# Patient Record
Sex: Female | Born: 1990
Health system: Southern US, Community
[De-identification: ages and names within clinical notes are randomized; demographics above are authoritative.]

## PROBLEM LIST (undated history)

## (undated) ENCOUNTER — Ambulatory Visit (HOSPITAL_COMMUNITY): Payer: Medicaid Other

## (undated) ENCOUNTER — Inpatient Hospital Stay (HOSPITAL_COMMUNITY): Payer: Self-pay

## (undated) ENCOUNTER — Emergency Department (HOSPITAL_BASED_OUTPATIENT_CLINIC_OR_DEPARTMENT_OTHER): Admission: EM | Source: Home / Self Care

## (undated) DIAGNOSIS — R768 Other specified abnormal immunological findings in serum: Secondary | ICD-10-CM

## (undated) DIAGNOSIS — O24419 Gestational diabetes mellitus in pregnancy, unspecified control: Secondary | ICD-10-CM

## (undated) DIAGNOSIS — K219 Gastro-esophageal reflux disease without esophagitis: Secondary | ICD-10-CM

## (undated) DIAGNOSIS — F419 Anxiety disorder, unspecified: Secondary | ICD-10-CM

## (undated) DIAGNOSIS — E119 Type 2 diabetes mellitus without complications: Secondary | ICD-10-CM

## (undated) DIAGNOSIS — M329 Systemic lupus erythematosus, unspecified: Secondary | ICD-10-CM

## (undated) DIAGNOSIS — D649 Anemia, unspecified: Secondary | ICD-10-CM

## (undated) DIAGNOSIS — T7840XA Allergy, unspecified, initial encounter: Secondary | ICD-10-CM

## (undated) DIAGNOSIS — F32A Depression, unspecified: Secondary | ICD-10-CM

## (undated) DIAGNOSIS — I10 Essential (primary) hypertension: Secondary | ICD-10-CM

## (undated) DIAGNOSIS — E282 Polycystic ovarian syndrome: Secondary | ICD-10-CM

## (undated) DIAGNOSIS — E669 Obesity, unspecified: Secondary | ICD-10-CM

## (undated) HISTORY — DX: Systemic lupus erythematosus, unspecified: M32.9

## (undated) HISTORY — DX: Anxiety disorder, unspecified: F41.9

## (undated) HISTORY — DX: Depression, unspecified: F32.A

## (undated) HISTORY — PX: TONSILLECTOMY: SUR1361

## (undated) HISTORY — DX: Gastro-esophageal reflux disease without esophagitis: K21.9

## (undated) HISTORY — DX: Anemia, unspecified: D64.9

## (undated) HISTORY — DX: Other specified abnormal immunological findings in serum: R76.8

## (undated) HISTORY — DX: Allergy, unspecified, initial encounter: T78.40XA

## (undated) HISTORY — DX: Obesity, unspecified: E66.9

---
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
## 2012-01-06 ENCOUNTER — Telehealth: Payer: Self-pay | Admitting: Family Medicine

## 2012-01-06 DIAGNOSIS — E282 Polycystic ovarian syndrome: Secondary | ICD-10-CM

## 2012-01-06 MED ORDER — METFORMIN HCL 500 MG PO TABS: 500.0000 mg | ORAL_TABLET | Freq: Two times a day (BID) | ORAL | Status: DC

## 2012-01-06 NOTE — Telephone Encounter (Signed)
Will forward message to Dr.  Konrad Dolores, message left on voicemail for patient  .

## 2012-01-06 NOTE — Telephone Encounter (Signed)
Rx for Metformin sent in to pharmacy for treatment of PCOS w/ dose escelation in 2 weeks.   Shelly Flatten, MD Family Medicine PGY-2 01/06/2012, 1:52 PM

## 2012-01-06 NOTE — Telephone Encounter (Signed)
Called to patient's  phone number  And message left that RX was sent in and explained directions on voicemail. Also advised her to go directly by  directions on bottle.

## 2012-01-06 NOTE — Telephone Encounter (Signed)
Metformin has been sent in. Please let pt know it is there.  GI upset is main side effect 500 BID WC x2wks then increase to 1000mg  BID. If unable to tolerate higher dose then go back to decreased dose

## 2012-01-06 NOTE — Telephone Encounter (Signed)
Pt is supposed to start taking Metformin but Losq wanted to wait until her stomach prob resolved.  She is now ready for the script and needs it called into [facility] [address]

## 2012-01-06 NOTE — Assessment & Plan Note (Signed)
Starting Metformin 500mg  BID w/ planned dose increase in 2 weeks

## 2012-01-26 ENCOUNTER — Telehealth: Payer: Self-pay | Admitting: Family Medicine

## 2012-01-26 NOTE — Telephone Encounter (Signed)
Pt calls back and states the cramping has subsided but continues w/diarrhea.

## 2012-01-26 NOTE — Telephone Encounter (Signed)
Pt stopped taking her Metformin 2 days ago because it made her feel bad - wants to know if there is something else she can take.

## 2012-01-26 NOTE — Telephone Encounter (Signed)
Pt states she is on BID and still w/nausea and cramping.Will forward to Dr Gwenlyn Saran for review.

## 2012-01-28 NOTE — Telephone Encounter (Signed)
Called patient and left message stating that of metformin was causing diarrhea and abdominal discomfort, she could stop taking it for now. Advised to continue taking OCP's. Will discuss further at next appointment.

## 2012-01-28 NOTE — Telephone Encounter (Signed)
Patient is calling back because she hasn't heard back from anyone on her previous message.

## 2012-01-28 NOTE — Telephone Encounter (Signed)
Left message for pt to call and also routed message to Dr.Losq for review. Lorenda Hatchet, Renato Battles

## 2012-02-07 ENCOUNTER — Ambulatory Visit (INDEPENDENT_AMBULATORY_CARE_PROVIDER_SITE_OTHER): Payer: BC Managed Care – PPO | Admitting: Family Medicine

## 2012-02-07 ENCOUNTER — Encounter: Payer: Self-pay | Admitting: Family Medicine

## 2012-02-07 VITALS — BP 127/80 | HR 92 | Temp 98.8°F | Ht 68.0 in | Wt 235.0 lb

## 2012-02-07 DIAGNOSIS — M549 Dorsalgia, unspecified: Secondary | ICD-10-CM

## 2012-02-07 DIAGNOSIS — R350 Frequency of micturition: Secondary | ICD-10-CM

## 2012-02-07 LAB — POCT UA - MICROSCOPIC ONLY: Epithelial cells, urine per micros: >20

## 2012-02-07 LAB — POCT URINALYSIS DIPSTICK
Bilirubin, UA: NEGATIVE
Glucose, UA: NEGATIVE
Ketones, UA: NEGATIVE
Nitrite, UA: NEGATIVE
Spec Grav, UA: 1.025
Urobilinogen, UA: 1
pH, UA: 6.5

## 2012-02-07 NOTE — Patient Instructions (Signed)
It was nice to meet you.  Your urine does not show a UTI.  I think you strained your back. Please see the attached hand out with back stretches and exercises, and you can take ibuprofen or tylenol as needed for pain.

## 2012-02-08 DIAGNOSIS — M549 Dorsalgia, unspecified: Secondary | ICD-10-CM | POA: Insufficient documentation

## 2012-02-08 NOTE — Progress Notes (Signed)
  Subjective:    Patient ID: Diane Lloyd, female    DOB: 1990-03-28, 22 y.o.   MRN: [id number]  HPI  Lynett Fish comes in with back pain that started last week after a shift at work (works in [employer]).  She says it hurts in her low back, does not radiate anywhere.  She says she also had some urinary frequency with the back pain, so she was concerned about a UTI.  She denies fevers, chills, nausea, vomiting, pain or burning with urniation.   Review of Systems     Objective:   Physical Exam BP 127/80  Pulse 92  Temp 98.8 F (37.1 C) (Oral)  Ht 5\' 8"  (1.727 m)  Wt 235 lb (106.595 kg)  BMI 35.73 kg/m2  LMP 02/02/2012 General appearance: alert, cooperative and no distress Back: symmetric, no curvature. ROM normal. No CVA tenderness. Mild paraspinal muscle tenderness to palpation, normal strength, sensation, reflexes of LE.  Lungs: clear to auscultation bilaterally Heart: regular rate and rhythm, S1, S2 normal, no murmur, click, rub or gallop Abdomen: soft, non-tender; bowel sounds normal; no masses,  no organomegaly Extremities: extremities normal, atraumatic, no cyanosis or edema      Assessment & Plan:

## 2012-02-08 NOTE — Assessment & Plan Note (Signed)
UA not a clean catch, but no fevers or cva tenderness, feel this is a muscle strain, and frequency (that has now resolved) was coincidental.  Discussed back pain and gave hand out with stretches/exercises, advised tylenol or ibuprofen OTC.

## 2012-03-10 ENCOUNTER — Encounter: Payer: Self-pay | Admitting: Family Medicine

## 2012-03-10 ENCOUNTER — Ambulatory Visit (INDEPENDENT_AMBULATORY_CARE_PROVIDER_SITE_OTHER): Payer: BC Managed Care – PPO | Admitting: Family Medicine

## 2012-03-10 VITALS — BP 142/85 | HR 86 | Temp 98.3°F | Ht 68.5 in | Wt 242.0 lb

## 2012-03-10 DIAGNOSIS — T148XXA Other injury of unspecified body region, initial encounter: Secondary | ICD-10-CM | POA: Insufficient documentation

## 2012-03-10 NOTE — Patient Instructions (Addendum)
Diane Lloyd,  Thank you for coming in today.  Your puncture site looks healthy. Please wash the skin like normal. Your tetanus series is complete and you had a booster 2 years ago so you are covered.   Take tylenol [phone] mg every 8 hrs for pain. Try motrin or naproxen if tylenol does not help. May also ice the area.  F/u for worsening pain, new redness/swelling or fever.   Dr. Armen Pickup

## 2012-03-10 NOTE — Assessment & Plan Note (Signed)
A: clean puncture wound L thigh. No evidence of cellulitis. Tetanus up to date. P: Normal care Ice, tylenol, NSAID prn pain.

## 2012-03-10 NOTE — Progress Notes (Signed)
Subjective:     Patient ID: Diane Lloyd, female   DOB: 1990-12-11, 22 y.o.   MRN: [id number]  HPI 22 yo F presents for SD visit for puncture wound to L lateral thigh. Occurred two days ago. Punctured with an ink pin. She admits to pain, no treatment, denies redness, swelling and fever.   Review of Systems As per HPI    Objective:   Physical Exam BP 142/85  Pulse 86  Temp(Src) 98.3 F (36.8 C) (Oral)  Ht 5' 8.5" (1.74 m)  Wt 242 lb (109.77 kg)  BMI 36.26 kg/m2 General appearance: alert, cooperative and no distress Skin: pen sized puncture wound L lateral thigh, indurated, no fluctuance, erythema or edema.   Last tetanus booster in 2012.     Assessment and Plan:

## 2012-03-17 ENCOUNTER — Ambulatory Visit: Payer: BC Managed Care – PPO | Admitting: Family Medicine

## 2012-03-20 ENCOUNTER — Encounter: Payer: Self-pay | Admitting: Family Medicine

## 2012-03-20 ENCOUNTER — Ambulatory Visit (INDEPENDENT_AMBULATORY_CARE_PROVIDER_SITE_OTHER): Payer: BC Managed Care – PPO | Admitting: Family Medicine

## 2012-03-20 VITALS — BP 127/79 | HR 80 | Temp 98.4°F | Ht 68.5 in | Wt 239.0 lb

## 2012-03-20 DIAGNOSIS — K219 Gastro-esophageal reflux disease without esophagitis: Secondary | ICD-10-CM

## 2012-03-20 MED ORDER — OMEPRAZOLE 20 MG PO TBEC: 20.0000 mg | DELAYED_RELEASE_TABLET | Freq: Every day | ORAL | Status: DC

## 2012-03-20 NOTE — Assessment & Plan Note (Signed)
Suspect GERD, will start omeprazole.  Also discussed dietary and lifestyle modification and gave hand out.

## 2012-03-20 NOTE — Patient Instructions (Signed)
Diet for Gastroesophageal Reflux Disease, Adult  Reflux (acid reflux) is when acid from your stomach flows up into the esophagus. When acid comes in contact with the esophagus, the acid causes irritation and soreness (inflammation) in the esophagus. When reflux happens often or so severely that it causes damage to the esophagus, it is called gastroesophageal reflux disease (GERD). Nutrition therapy can help ease the discomfort of GERD.  FOODS OR DRINKS TO AVOID OR LIMIT   Smoking or chewing tobacco. Nicotine is one of the most potent stimulants to acid production in the gastrointestinal tract.   Caffeinated and decaffeinated coffee and black tea.   Regular or low-calorie carbonated beverages or energy drinks (caffeine-free carbonated beverages are allowed).    Strong spices, such as black pepper, white pepper, red pepper, cayenne, curry powder, and chili powder.   Peppermint or spearmint.   Chocolate.   High-fat foods, including meats and fried foods. Extra added fats including oils, butter, salad dressings, and nuts. Limit these to less than 8 tsp per day.   Fruits and vegetables if they are not tolerated, such as citrus fruits or tomatoes.   Alcohol.   Any food that seems to aggravate your condition.  If you have questions regarding your diet, call your caregiver or a registered dietitian.  OTHER THINGS THAT MAY HELP GERD INCLUDE:    Eating your meals slowly, in a relaxed setting.   Eating 5 to 6 small meals per day instead of 3 large meals.   Eliminating food for a period of time if it causes distress.   Not lying down until 3 hours after eating a meal.   Keeping the head of your bed raised 6 to 9 inches (15 to 23 cm) by using a foam wedge or blocks under the legs of the bed. Lying flat may make symptoms worse.   Being physically active. Weight loss may be helpful in reducing reflux in overweight or obese adults.   Wear loose fitting clothing  EXAMPLE MEAL PLAN  This meal plan is approximately  2,000 calories based on ChooseMyPlate.gov meal planning guidelines.  Breakfast    cup cooked oatmeal.   1 cup strawberries.   1 cup low-fat milk.   1 oz almonds.  Snack   1 cup cucumber slices.   6 oz yogurt (made from low-fat or fat-free milk).  Lunch   2 slice whole-wheat bread.   2 oz sliced turkey.   2 tsp mayonnaise.   1 cup blueberries.   1 cup snap peas.  Snack   6 whole-wheat crackers.   1 oz string cheese.  Dinner    cup brown rice.   1 cup mixed veggies.   1 tsp olive oil.   3 oz grilled fish.  Document Released: 12/28/2004 Document Revised: 03/22/2011 Document Reviewed: 11/13/2010  [employer] Patient Information 2013 [employer].

## 2012-03-20 NOTE — Progress Notes (Signed)
  Subjective:    Patient ID: Diane Lloyd, female    DOB: January 14, 1990, 22 y.o.   MRN: [id number]  HPI  Diane Lloyd comes in for abdominal pain that has been going on for 2-3 weeks.  She says it is an epigastric pain and describes it as an ache.  It is worst at night when she lays down.  She endorses a metallic taste in her mouth and some nausea.  She has not had constipation or diarrhea, no fevers, chills, or vomiting.   Review of Systems See HPI    Objective:   Physical Exam BP 127/79  Pulse 80  Temp(Src) 98.4 F (36.9 C) (Oral)  Ht 5' 8.5" (1.74 m)  Wt 239 lb (108.41 kg)  BMI 35.81 kg/m2  LMP 03/09/2012 General appearance: alert, cooperative and no distress Lungs: clear to auscultation bilaterally Heart: regular rate and rhythm, S1, S2 normal, no murmur, click, rub or gallop Abdomen: soft, mild epigastric tenderness, no organomegaly, rebound or guarding.  Pulses: 2+ and symmetric       Assessment & Plan:

## 2012-05-05 ENCOUNTER — Ambulatory Visit: Payer: BC Managed Care – PPO | Admitting: Family Medicine

## 2012-05-09 ENCOUNTER — Telehealth: Payer: Self-pay | Admitting: Family Medicine

## 2012-05-09 NOTE — Telephone Encounter (Signed)
Mom informed copy of shot record ready to be picked up

## 2012-05-09 NOTE — Telephone Encounter (Signed)
Mom requests Imm record faxed to [phone]. Faxed.

## 2012-05-09 NOTE — Telephone Encounter (Signed)
Mom need to pick up copy of shot record for school.  Please call when ready

## 2012-05-10 ENCOUNTER — Ambulatory Visit (INDEPENDENT_AMBULATORY_CARE_PROVIDER_SITE_OTHER): Payer: BC Managed Care – PPO | Admitting: *Deleted

## 2012-05-10 DIAGNOSIS — Z111 Encounter for screening for respiratory tuberculosis: Secondary | ICD-10-CM

## 2012-05-10 NOTE — Progress Notes (Signed)
Tuberculin skin test applied to left ventral forearm.  she will come into office on Friday for reading of skin test Cailyn Houdek, Harold Hedge, RN

## 2012-05-17 ENCOUNTER — Encounter: Payer: Self-pay | Admitting: Family Medicine

## 2012-05-17 ENCOUNTER — Ambulatory Visit (INDEPENDENT_AMBULATORY_CARE_PROVIDER_SITE_OTHER): Payer: BC Managed Care – PPO | Admitting: Family Medicine

## 2012-05-17 VITALS — BP 132/78 | HR 78 | Temp 98.6°F | Ht 68.5 in | Wt 240.0 lb

## 2012-05-17 DIAGNOSIS — E282 Polycystic ovarian syndrome: Secondary | ICD-10-CM

## 2012-05-17 DIAGNOSIS — Z111 Encounter for screening for respiratory tuberculosis: Secondary | ICD-10-CM

## 2012-05-17 NOTE — Assessment & Plan Note (Signed)
On OCPs, no pain or issues. Counseling regarding PCOS done. Pt in agreement with not getting repeat U/S.

## 2012-05-17 NOTE — Progress Notes (Signed)
S: Pt comes in today for PCOS.  Has been diagnosed with PCOS and is currently on Sprintec.  She would like a referral to women's because she would like another ultrasound.  She is not having any pain or issues, just wasn't sure when she needed another one.  Her pain is much bette since starting the OCP.  She has previously tried metformin but it caused significant nausea so she stopped it.  She is having regular periods on the OCP, usually lasting 5-6 days with ~3 heavy days where she used 5-6 pads per day.  She reports this is an improvement.  She also started having diarrhea yesterday. No nausea or vomiting. She doesn't think she ate anything different yesterday.  Stools are watery, without blood.  No recent travel.  No sick contacts. No fevers/chills. Able to eat and drink.    ROS: Per HPI  History  Smoking status  . Never Smoker   Smokeless tobacco  . Not on file    O:  Filed Vitals:   05/17/12 1524  BP: 132/78  Pulse: 78  Temp: 98.6 F (37 C)    Gen: NAD   A/P: 22 y.o. female p/w PCOS -See problem list -f/u in PRN

## 2012-05-17 NOTE — Patient Instructions (Addendum)
It was nice to meet you today.  For the diarrhea, you can use immodium for the next few days.  For your PCOS, continue your birth control pill.  We do not need to get another ultrasound unless you start having pain or problems again.  Weight loss will also help!  Come back to see Dr. Gwenlyn Saran as needed.

## 2012-05-19 ENCOUNTER — Encounter: Payer: Self-pay | Admitting: *Deleted

## 2012-05-19 ENCOUNTER — Ambulatory Visit (INDEPENDENT_AMBULATORY_CARE_PROVIDER_SITE_OTHER): Payer: BC Managed Care – PPO | Admitting: *Deleted

## 2012-05-19 DIAGNOSIS — Z111 Encounter for screening for respiratory tuberculosis: Secondary | ICD-10-CM

## 2012-05-19 LAB — TB SKIN TEST
Induration: 0 mm
TB Skin Test: NEGATIVE

## 2012-05-19 NOTE — Progress Notes (Signed)
PPD Reading Note PPD read and results entered in EpicCare. Result: 0 mm induration. Interpretation: Negative Richardson, Jeannette Ann, RN  

## 2012-06-06 ENCOUNTER — Telehealth: Payer: Self-pay | Admitting: Family Medicine

## 2012-06-06 NOTE — Telephone Encounter (Signed)
Pt has questions about conceiving. Wants to know when she can start taking the clomide (?) Please advise

## 2012-06-06 NOTE — Telephone Encounter (Signed)
LMOVM for patient instructing her that she will need an office visit with her PCP to discuss her questions.  Kadeisha Betsch, Darlyne Russian, CMA

## 2012-06-23 ENCOUNTER — Encounter: Payer: Self-pay | Admitting: Family Medicine

## 2012-06-23 ENCOUNTER — Ambulatory Visit (INDEPENDENT_AMBULATORY_CARE_PROVIDER_SITE_OTHER): Payer: BC Managed Care – PPO | Admitting: Family Medicine

## 2012-06-23 VITALS — BP 145/85 | HR 91 | Ht 68.0 in | Wt 236.4 lb

## 2012-06-23 DIAGNOSIS — E282 Polycystic ovarian syndrome: Secondary | ICD-10-CM

## 2012-06-23 NOTE — Patient Instructions (Addendum)
You can stop the birth control after the pack is done.  Log any irregular bleeding.   Cut out sodas and increase the amount of vegetables. Increase your activity level.   Follow up in 6 weeks.

## 2012-06-25 NOTE — Progress Notes (Signed)
Patient ID: [name]    DOB: 1990/01/14, 22 y.o.   MRN: [id number] --- Subjective:  Cedra is a 22 y.o.female with PCOS who presents for follow up.  - patient expresses wanting to become pregnant and would like to know how to proceed given that she has PCOS.  She has been on OCP's since October which was started for amnorrhea and PCOS. She has a history of irregular periods since adolescence and had a period of no period for 3 months prior to her starting OCP.  Since she has been on sprintec, she has had regular periods. She takes pill regularly with only occasional missed pills.  She was on metformin for a couple of months but could not tolerate the GI side effects and this was stopped.  She has gained 19lbs since May 2013. She attributes this to eating at fast food restaurants and not watching what she eats. She states that she drinks non diet sodas and doesn't exercise.  She reports that there was a time where she lost 50lbs by watching her diet and exercising.   She is in a relationship with her boyfriend of 5 years and they are both wanting to get pregnant. He is 20 years old. 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 She lives with her parents currently but will be moving in her own apartment soon.   ROS: see HPI Past Medical History: reviewed and updated medications and allergies. Social History: Tobacco: none   Objective: Filed Vitals:   06/23/12 0835  BP: 145/85  Pulse: 91    Physical Examination:   General appearance - alert, well appearing, and in no distress Skin - acanthosis nigricans on neck Chest - clear to auscultation, no wheezes, rales or rhonchi, symmetric air entry Heart - normal rate, regular rhythm, normal S1, S2, no murmurs, rubs, clicks or gallops Abdomen - soft, nontender, nondistended Extremities - no pedal edema

## 2012-06-25 NOTE — Assessment & Plan Note (Signed)
Patient with PCOS wanting to become pregnant. Patient is young and I do not see huge rush in pregnancy. She does appear to have a stable relationship and a stable life plan (at school and working part time).  Will stop OCP's after last pill in packet. Recommended weight loss as initial step in more regular periods. Reviewed diet tips with patient. She has picked to quit sodas and eat more vegetables. She will also start walking more for exercise.  Patient to return in 6 weeks. At that time, will check A1C for evaluation of insulin resistance. She was on metformin which she did not tolerate. She may benefit from getting on brand metformin which may have fewer GI side effects. If cycles do not regulate with weight loss, will refer her to gyn for further eval and treatment.

## 2012-08-04 ENCOUNTER — Ambulatory Visit: Payer: BC Managed Care – PPO | Admitting: Family Medicine

## 2012-08-16 ENCOUNTER — Ambulatory Visit (INDEPENDENT_AMBULATORY_CARE_PROVIDER_SITE_OTHER): Payer: BC Managed Care – PPO | Admitting: Family Medicine

## 2012-08-16 ENCOUNTER — Encounter: Payer: Self-pay | Admitting: Family Medicine

## 2012-08-16 VITALS — BP 146/90 | HR 91 | Temp 98.3°F | Wt 235.0 lb

## 2012-08-16 DIAGNOSIS — R0789 Other chest pain: Secondary | ICD-10-CM | POA: Insufficient documentation

## 2012-08-16 DIAGNOSIS — R1013 Epigastric pain: Secondary | ICD-10-CM

## 2012-08-16 DIAGNOSIS — R109 Unspecified abdominal pain: Secondary | ICD-10-CM

## 2012-08-16 LAB — POCT URINALYSIS DIPSTICK
Bilirubin, UA: NEGATIVE
Glucose, UA: NEGATIVE
Ketones, UA: NEGATIVE
Leukocytes, UA: NEGATIVE
Nitrite, UA: NEGATIVE
Protein, UA: NEGATIVE
Spec Grav, UA: 1.015
Urobilinogen, UA: 0.2
pH, UA: 7

## 2012-08-16 LAB — POCT UA - MICROSCOPIC ONLY

## 2012-08-16 MED ORDER — OMEPRAZOLE 20 MG PO TBEC: 20.0000 mg | DELAYED_RELEASE_TABLET | Freq: Every day | ORAL | Status: DC

## 2012-08-16 NOTE — Addendum Note (Signed)
Addended by: [place], Vale Peraza on: 08/16/2012 03:30 PM   Modules accepted: Orders

## 2012-08-16 NOTE — Progress Notes (Signed)
  Subjective:    Patient ID: Diane Lloyd, female    DOB: 19-Mar-1990, 22 y.o.   MRN: [id number]  Abdominal Pain   Patient is a 22 yo female who presents with stomach pains.  Located ion epigastric region mainly. Comes and goes. Not like menstrual or hunger pains. Dull ache. Has history of GERD and was previously on nexium. Has reflux burning pain in chest. Will wake up with this sensation following eating spicy foods. Notes some nausea. Denies vomiting and diarrhea. Denies radiation of this chest ache and pain. States chest pain gets better with activity. Denies abnormal BMs. States has normal regular periods every 4 weeks.  Patient notes that her grandmother just died of some type of abdominal cancer and she is worried that this may be related to cancer.  Review of Systems  Gastrointestinal: Positive for abdominal pain.   see HPI     Objective:   Physical Exam  Constitutional: She appears well-developed and well-nourished.  HENT:  Head: Normocephalic and atraumatic.  Cardiovascular: Regular rhythm and normal heart sounds.   Pulmonary/Chest: Effort normal and breath sounds normal.  Abdominal: Soft. Bowel sounds are normal. She exhibits no distension. There is tenderness (epigastric region). There is no rebound and no guarding.  Musculoskeletal:  Chest non-tender to palpation  BP 146/90  Pulse 91  Temp(Src) 98.3 F (36.8 C) (Oral)  Wt 235 lb (106.595 kg)  BMI 35.74 kg/m2  LMP 08/01/2012    Assessment & Plan:

## 2012-08-16 NOTE — Assessment & Plan Note (Signed)
Presents with epigastric pain and symptoms consistent with reflux. Has not been on PPI in about a year per the patient. I believe her symptoms are most likely related to not having been on the PPI. Will restart nexium and advised to follow-up if not improving.

## 2012-08-16 NOTE — Patient Instructions (Addendum)
Nice to meet you. I think your abdominal pain is related to your reflux and not being on medication. I have prescribed your omeprazole for this.

## 2012-08-31 ENCOUNTER — Ambulatory Visit: Payer: BC Managed Care – PPO | Admitting: Family Medicine

## 2012-09-18 ENCOUNTER — Ambulatory Visit: Payer: Self-pay | Admitting: Family Medicine

## 2012-11-27 ENCOUNTER — Telehealth: Payer: Self-pay | Admitting: Family Medicine

## 2012-11-27 ENCOUNTER — Encounter (HOSPITAL_COMMUNITY): Payer: Self-pay | Admitting: Emergency Medicine

## 2012-11-27 ENCOUNTER — Emergency Department (HOSPITAL_COMMUNITY): Payer: BC Managed Care – PPO

## 2012-11-27 ENCOUNTER — Emergency Department (HOSPITAL_COMMUNITY)
Admission: EM | Admit: 2012-11-27 | Discharge: 2012-11-27 | Disposition: A | Discharge status: Home / Self Care | Payer: BC Managed Care – PPO | Attending: Emergency Medicine | Admitting: Emergency Medicine

## 2012-11-27 ENCOUNTER — Ambulatory Visit: Payer: Self-pay

## 2012-11-27 DIAGNOSIS — Z3202 Encounter for pregnancy test, result negative: Secondary | ICD-10-CM | POA: Insufficient documentation

## 2012-11-27 DIAGNOSIS — Z862 Personal history of diseases of the blood and blood-forming organs and certain disorders involving the immune mechanism: Secondary | ICD-10-CM | POA: Insufficient documentation

## 2012-11-27 DIAGNOSIS — R109 Unspecified abdominal pain: Secondary | ICD-10-CM

## 2012-11-27 DIAGNOSIS — Z8739 Personal history of other diseases of the musculoskeletal system and connective tissue: Secondary | ICD-10-CM | POA: Insufficient documentation

## 2012-11-27 DIAGNOSIS — E669 Obesity, unspecified: Secondary | ICD-10-CM | POA: Insufficient documentation

## 2012-11-27 DIAGNOSIS — R1013 Epigastric pain: Secondary | ICD-10-CM | POA: Insufficient documentation

## 2012-11-27 DIAGNOSIS — Z79899 Other long term (current) drug therapy: Secondary | ICD-10-CM | POA: Insufficient documentation

## 2012-11-27 DIAGNOSIS — K219 Gastro-esophageal reflux disease without esophagitis: Secondary | ICD-10-CM | POA: Insufficient documentation

## 2012-11-27 DIAGNOSIS — J45909 Unspecified asthma, uncomplicated: Secondary | ICD-10-CM | POA: Insufficient documentation

## 2012-11-27 DIAGNOSIS — R350 Frequency of micturition: Secondary | ICD-10-CM | POA: Insufficient documentation

## 2012-11-27 DIAGNOSIS — J069 Acute upper respiratory infection, unspecified: Secondary | ICD-10-CM

## 2012-11-27 LAB — POCT PREGNANCY, URINE: Preg Test, Ur: NEGATIVE

## 2012-11-27 LAB — URINALYSIS, ROUTINE W REFLEX MICROSCOPIC
Bilirubin Urine: NEGATIVE
Glucose, UA: NEGATIVE mg/dL
Hgb urine dipstick: NEGATIVE
Ketones, ur: NEGATIVE mg/dL
Leukocytes, UA: NEGATIVE
Nitrite: NEGATIVE
Protein, ur: NEGATIVE mg/dL
Specific Gravity, Urine: 1.014 (ref 1.005–1.030)
Urobilinogen, UA: 1 mg/dL (ref 0.0–1.0)
pH: 6.5 (ref 5.0–8.0)

## 2012-11-27 LAB — GLUCOSE, CAPILLARY: Glucose-Capillary: 121 mg/dL — ABNORMAL HIGH (ref 70–99)

## 2012-11-27 MED ORDER — GI COCKTAIL ~~LOC~~
30.0000 mL | Freq: Once | ORAL | Status: AC
  Administered 2012-11-27: 30 mL via ORAL
  Filled 2012-11-27: qty 30

## 2012-11-27 MED ORDER — PANTOPRAZOLE SODIUM 40 MG PO TBEC
40.0000 mg | DELAYED_RELEASE_TABLET | Freq: Once | ORAL | Status: AC
  Administered 2012-11-27: 40 mg via ORAL
  Filled 2012-11-27: qty 1

## 2012-11-27 MED ORDER — GUAIFENESIN 100 MG/5ML PO LIQD: 100.0000 mg | ORAL | Status: DC | PRN

## 2012-11-27 MED ORDER — FAMOTIDINE 20 MG PO TABS: 20.0000 mg | ORAL_TABLET | Freq: Two times a day (BID) | ORAL | Status: DC

## 2012-11-27 MED ORDER — FAMOTIDINE 20 MG PO TABS
20.0000 mg | ORAL_TABLET | Freq: Once | ORAL | Status: AC
  Administered 2012-11-27: 20 mg via ORAL
  Filled 2012-11-27: qty 1

## 2012-11-27 NOTE — Telephone Encounter (Signed)
Pt called and would like a copy of her ppd and flu shot for this year printed out for pick up today. jw

## 2012-11-27 NOTE — ED Notes (Signed)
PT is here with upper abdominal pain for months with intermittent chest pains.  Pt reports cough

## 2012-11-27 NOTE — ED Provider Notes (Signed)
CSN: [id number]     Arrival date & time 11/27/12  0413 History   First MD Initiated Contact with Patient 11/27/12 0414     Chief Complaint  Patient presents with  . Abdominal Pain  . Cough   (Consider location/radiation/quality/duration/timing/severity/associated sxs/prior Treatment) HPI  History provided by patient. Presents with multiple complaints tonight. Chief complaint is epigastric pain, history of GERD, previously prescribed omeprazole. For dinner she ate at [employer], had a bacon cheddar burger, Cajun fries and sweet tea. Symptoms developed after that. She denies any nausea vomiting. No back pain. No chest pain or shortness of breath.  Patient also has dry cough for the last few days. No difficulty breathing. Her mother has been sick at home with similar symptoms. No fevers or chills. She did get a flu shot about 2 months ago.  Patient also complaining of urinary frequency. No dysuria. No urgency. No history of diabetes. She is requesting urinalysis.  Past Medical History  Diagnosis Date  . Anemia     iron def  . Obesity, unspecified   . Lupus   . Asthma    Past Surgical History  Procedure Laterality Date  . Tonsillectomy     Family History  Problem Relation Age of Onset  . Hypertension Mother   . Lupus Mother   . Diabetes Mother   . Cancer Father 4    esophagus  . Asthma Father   . Cancer Maternal Uncle 40    pancreatic cancer  . Cancer Paternal Grandmother 17    esophageal   History  Substance Use Topics  . Smoking status: Never Smoker   . Smokeless tobacco: Not on file  . Alcohol Use: No   OB History   Grav Para Term Preterm Abortions TAB SAB Ect Mult Living   0              Review of Systems  Constitutional: Negative for fever and chills.  Respiratory: Positive for cough. Negative for shortness of breath.   Cardiovascular: Negative for chest pain.  Gastrointestinal: Positive for abdominal pain. Negative for vomiting.  Endocrine: Negative for  polyphagia.  Genitourinary: Negative for dysuria.  Musculoskeletal: Negative for back pain.  Skin: Negative for rash.  Neurological: Negative for headaches.  All other systems reviewed and are negative.    Allergies  Review of patient's allergies indicates no known allergies.  Home Medications   Current Outpatient Rx  Name  Route  Sig  Dispense  Refill  . Omeprazole (EQ OMEPRAZOLE) 20 MG TBEC   Oral   Take 1 tablet (20 mg total) by mouth daily.   30 each   11    BP 169/98  Pulse 97  Temp(Src) 97.7 F (36.5 C) (Oral)  Resp 18  Wt 245 lb (111.131 kg)  SpO2 100% Physical Exam  Constitutional: She is oriented to person, place, and time. She appears well-developed and well-nourished.  HENT:  Head: Normocephalic and atraumatic.  Mouth/Throat: Oropharynx is clear and moist. No oropharyngeal exudate.  Eyes: EOM are normal. Pupils are equal, round, and reactive to light.  Neck: Neck supple.  Cardiovascular: Normal rate, regular rhythm and intact distal pulses.   Pulmonary/Chest: Effort normal and breath sounds normal. No respiratory distress. She exhibits no tenderness.  Abdominal: Soft. Bowel sounds are normal. She exhibits no distension.  Mild tenderness over epigastric region. No tenderness otherwise. Negative Murphy sign. No CVA tenderness.  Musculoskeletal: Normal range of motion. She exhibits no edema.  Neurological: She is alert and oriented  to person, place, and time.  Skin: Skin is warm and dry.    ED Course  Procedures (including critical care time) Labs Review Labs Reviewed  URINALYSIS, ROUTINE W REFLEX MICROSCOPIC - Abnormal; Notable for the following:    APPearance CLOUDY (*)    All other components within normal limits  GLUCOSE, CAPILLARY - Abnormal; Notable for the following:    Glucose-Capillary 121 (*)    All other components within normal limits  POCT PREGNANCY, URINE   Imaging Review Dg Chest 2 View  11/27/2012   CLINICAL DATA:  Abdominal pain  and cough  EXAM: CHEST  2 VIEW  COMPARISON:  07/03/2011  FINDINGS: The heart size and mediastinal contours are within normal limits. Both lungs are clear. No effusion or pneumothorax. No pneumoperitoneum. Mild pectus excavatum  IMPRESSION: No active cardiopulmonary disease.   Electronically Signed   By: Tiburcio Pea M.D.   On: 11/27/2012 05:24     Date: 11/27/2012  Rate: 91  Rhythm: normal sinus rhythm  QRS Axis: normal  Intervals: normal  ST/T Wave abnormalities: nonspecific ST changes  Conduction Disutrbances:none  Narrative Interpretation:   Old EKG Reviewed: unchanged  GI cocktail, Pepcid, Protonix  6:26 AM on recheck symptoms improving. Repeat abdominal exam is soft nontender nondistended. No acute abdomen. No indication for emergent imaging at this time.   Plan discharge home with prescription for Pepcid, and Robitussin. Abdominal pain precautions and URI precautions provided and verbalized as understood. Work note provided. Plan followup primary care physician as outpatient  MDM  Diagnoses: URI, abdominal pain  Evaluated with chest x-ray, CBG in urinalysis reviewed as above. No UTI Improved with medications Vital signs and nursing notes reviewed and considered    Sunnie Nielsen, MD 11/27/12 [phone]

## 2012-11-27 NOTE — Telephone Encounter (Signed)
Printed out and put up front for pick up. Lorenda Hatchet, Renato Battles

## 2012-11-27 NOTE — ED Notes (Signed)
Old and new EKG given to Dr Dierdre Highman

## 2012-12-23 ENCOUNTER — Encounter (HOSPITAL_COMMUNITY): Payer: Self-pay | Admitting: Emergency Medicine

## 2012-12-23 DIAGNOSIS — Z8739 Personal history of other diseases of the musculoskeletal system and connective tissue: Secondary | ICD-10-CM | POA: Insufficient documentation

## 2012-12-23 DIAGNOSIS — J45909 Unspecified asthma, uncomplicated: Secondary | ICD-10-CM | POA: Insufficient documentation

## 2012-12-23 DIAGNOSIS — Z3202 Encounter for pregnancy test, result negative: Secondary | ICD-10-CM | POA: Insufficient documentation

## 2012-12-23 DIAGNOSIS — E669 Obesity, unspecified: Secondary | ICD-10-CM | POA: Insufficient documentation

## 2012-12-23 DIAGNOSIS — N760 Acute vaginitis: Secondary | ICD-10-CM | POA: Insufficient documentation

## 2012-12-23 DIAGNOSIS — Z862 Personal history of diseases of the blood and blood-forming organs and certain disorders involving the immune mechanism: Secondary | ICD-10-CM | POA: Insufficient documentation

## 2012-12-23 DIAGNOSIS — B9689 Other specified bacterial agents as the cause of diseases classified elsewhere: Secondary | ICD-10-CM | POA: Insufficient documentation

## 2012-12-23 DIAGNOSIS — A499 Bacterial infection, unspecified: Secondary | ICD-10-CM | POA: Insufficient documentation

## 2012-12-23 LAB — POCT PREGNANCY, URINE: Preg Test, Ur: NEGATIVE

## 2012-12-23 NOTE — ED Notes (Signed)
Pt reports she has had intermittent  abdominal pain for the past 3 months but reports increased pain x4 days with lower back pain, as well.  Pt reports frequent urination, LBM 12/13, also nauseated.  Pt a&o NAD noted at this time

## 2012-12-24 ENCOUNTER — Emergency Department (HOSPITAL_COMMUNITY)
Admission: EM | Admit: 2012-12-24 | Discharge: 2012-12-24 | Disposition: A | Discharge status: Home / Self Care | Payer: BC Managed Care – PPO | Attending: Emergency Medicine | Admitting: Emergency Medicine

## 2012-12-24 DIAGNOSIS — R109 Unspecified abdominal pain: Secondary | ICD-10-CM

## 2012-12-24 DIAGNOSIS — B9689 Other specified bacterial agents as the cause of diseases classified elsewhere: Secondary | ICD-10-CM

## 2012-12-24 DIAGNOSIS — N760 Acute vaginitis: Secondary | ICD-10-CM

## 2012-12-24 LAB — COMPREHENSIVE METABOLIC PANEL
ALT: 26 U/L (ref 0–35)
AST: 22 U/L (ref 0–37)
Albumin: 3.8 g/dL (ref 3.5–5.2)
Alkaline Phosphatase: 56 U/L (ref 39–117)
BUN: 13 mg/dL (ref 6–23)
CO2: 25 mEq/L (ref 19–32)
Calcium: 9.4 mg/dL (ref 8.4–10.5)
Chloride: 98 mEq/L (ref 96–112)
Creatinine, Ser: 0.85 mg/dL (ref 0.50–1.10)
GFR calc Af Amer: >90 mL/min (ref >90)
GFR calc non Af Amer: >90 mL/min (ref >90)
Glucose, Bld: 105 mg/dL — ABNORMAL HIGH (ref 70–99)
Potassium: 3.6 mEq/L (ref 3.5–5.1)
Sodium: 134 mEq/L — ABNORMAL LOW (ref 135–145)
Total Bilirubin: 0.4 mg/dL (ref 0.3–1.2)
Total Protein: 7.4 g/dL (ref 6.0–8.3)

## 2012-12-24 LAB — URINALYSIS, ROUTINE W REFLEX MICROSCOPIC
Bilirubin Urine: NEGATIVE
Glucose, UA: NEGATIVE mg/dL
Hgb urine dipstick: NEGATIVE
Ketones, ur: NEGATIVE mg/dL
Nitrite: NEGATIVE
Protein, ur: NEGATIVE mg/dL
Specific Gravity, Urine: 1.01 (ref 1.005–1.030)
Urobilinogen, UA: 0.2 mg/dL (ref 0.0–1.0)
pH: 6.5 (ref 5.0–8.0)

## 2012-12-24 LAB — CBC WITH DIFFERENTIAL/PLATELET
Basophils Absolute: 0 10*3/uL (ref 0.0–0.1)
Basophils Relative: 0 % (ref 0–1)
Eosinophils Absolute: 0 10*3/uL (ref 0.0–0.7)
Eosinophils Relative: 1 % (ref 0–5)
HCT: 33.9 % — ABNORMAL LOW (ref 36.0–46.0)
Hemoglobin: 11.2 g/dL — ABNORMAL LOW (ref 12.0–15.0)
Lymphocytes Relative: 37 % (ref 12–46)
Lymphs Abs: 2.4 10*3/uL (ref 0.7–4.0)
MCH: 26.5 pg (ref 26.0–34.0)
MCHC: 33 g/dL (ref 30.0–36.0)
MCV: 80.1 fL (ref 78.0–100.0)
Monocytes Absolute: 0.5 10*3/uL (ref 0.1–1.0)
Monocytes Relative: 8 % (ref 3–12)
Neutro Abs: 3.5 10*3/uL (ref 1.7–7.7)
Neutrophils Relative %: 55 % (ref 43–77)
Platelets: 293 10*3/uL (ref 150–400)
RBC: 4.23 MIL/uL (ref 3.87–5.11)
RDW: 15 % (ref 11.5–15.5)
WBC: 6.4 10*3/uL (ref 4.0–10.5)

## 2012-12-24 LAB — URINE MICROSCOPIC-ADD ON

## 2012-12-24 LAB — WET PREP, GENITAL
Trich, Wet Prep: NONE SEEN
Yeast Wet Prep HPF POC: NONE SEEN

## 2012-12-24 LAB — LIPASE, BLOOD: Lipase: 24 U/L (ref 11–59)

## 2012-12-24 MED ORDER — POLYETHYLENE GLYCOL 3350 17 GM/SCOOP PO POWD: 17.0000 g | Freq: Every day | ORAL | Status: DC

## 2012-12-24 MED ORDER — IBUPROFEN 800 MG PO TABS
800.0000 mg | ORAL_TABLET | Freq: Once | ORAL | Status: AC
  Administered 2012-12-24: 800 mg via ORAL
  Filled 2012-12-24: qty 1

## 2012-12-24 MED ORDER — IBUPROFEN 800 MG PO TABS: 800.0000 mg | ORAL_TABLET | Freq: Three times a day (TID) | ORAL | Status: DC

## 2012-12-24 MED ORDER — METRONIDAZOLE 500 MG PO TABS: 500.0000 mg | ORAL_TABLET | Freq: Two times a day (BID) | ORAL | Status: DC

## 2012-12-24 NOTE — ED Provider Notes (Signed)
CSN: [id number]     Arrival date & time 12/23/12  2308 History   First MD Initiated Contact with Patient 12/24/12 0015     Chief Complaint  Patient presents with  . Abdominal Pain  . Back Pain   HPI  History provided by the patient. The patient is a 22 year old female with history of polycystic ovary disease, lupus and asthma who presents with complaints of worsening right lower abdominal pains. Patient states she has had some intermittent and waxing and waning pains in the right lower abdomen for the past 3 months. She states pains feel different than her previous ovarian cysts. Over the last 4 days pain has become much more intense and persistent. Patient also reports having slight vaginal discharge for the past week or more. Pain occasionally radiates into the low back. It is worse with certain movements and position. Symptoms have also been associated with some urinary frequency. Patient also reports constipation with last bowel movement 3 days ago. Denies dysuria or hematuria. Last normal menstrual period was December 8 lasting for 4 days. No other aggravating or alleviating factors. No other associated symptoms.     Past Medical History  Diagnosis Date  . Anemia     iron def  . Obesity, unspecified   . Lupus   . Asthma    Past Surgical History  Procedure Laterality Date  . Tonsillectomy     Family History  Problem Relation Age of Onset  . Hypertension Mother   . Lupus Mother   . Diabetes Mother   . Cancer Father 57    esophagus  . Asthma Father   . Cancer Maternal Uncle 40    pancreatic cancer  . Cancer Paternal Grandmother 75    esophageal   History  Substance Use Topics  . Smoking status: Never Smoker   . Smokeless tobacco: Not on file  . Alcohol Use: No   OB History   Grav Para Term Preterm Abortions TAB SAB Ect Mult Living   0              Review of Systems  Constitutional: Negative for fever, chills and diaphoresis.  HENT: Negative for congestion and  sore throat.   Respiratory: Negative for cough and shortness of breath.   Cardiovascular: Negative for chest pain.  Gastrointestinal: Positive for abdominal pain and constipation. Negative for nausea, vomiting and diarrhea.  Genitourinary: Positive for vaginal discharge. Negative for dysuria, frequency, hematuria, flank pain, vaginal bleeding and menstrual problem.  All other systems reviewed and are negative.    Allergies  Review of patient's allergies indicates no known allergies.  Home Medications  No current outpatient prescriptions on file. BP 159/68  Pulse 79  Temp(Src) 98.2 F (36.8 C) (Oral)  Resp 18  Ht 5\' 9"  (1.753 m)  Wt 248 lb (112.492 kg)  BMI 36.61 kg/m2  SpO2 95%  LMP 12/18/2012 Physical Exam  Nursing note and vitals reviewed. Constitutional: She is oriented to person, place, and time. She appears well-developed and well-nourished. No distress.  HENT:  Head: Normocephalic.  Cardiovascular: Normal rate and regular rhythm.   Pulmonary/Chest: Effort normal and breath sounds normal. No respiratory distress. She has no wheezes. She has no rales.  Abdominal: Soft. There is tenderness in the right lower quadrant. There is no rigidity, no rebound, no guarding, no CVA tenderness, no tenderness at McBurney's point and negative Murphy's sign.  Obese  Genitourinary:  Chaperone was present. There is small amounts of white vaginal discharge. No adnexal  pain or tenderness. No CMT.  Neurological: She is alert and oriented to person, place, and time.  Skin: Skin is warm and dry. No rash noted.  Psychiatric: She has a normal mood and affect. Her behavior is normal.    ED Course  Procedures  DIAGNOSTIC STUDIES: Oxygen Saturation is 95% on room air.    COORDINATION OF CARE:  Nursing notes reviewed. Vital signs reviewed. Initial pt interview and examination performed.   12:20 AM-patient seen and evaluated. She appears well in no acute distress. Symptoms have had a chronic  component with some acute worsening over the past 4 days. Patient has soft abdomen without peritoneal signs. Mild to moderate pains in the right lower abdomen. Discussed work up plan with pt at bedside, which includes lab testing, pelvic exam and UA. Pt agrees with plan.  Patient sleeping and feeling more comfortable. Her lab testing is unremarkable. There are signs of DVT on wet prep. Normal WBC. Reexam of the abdomen is soft without any significant tenderness. Doubt appendicitis. No other signs or symptoms for concerning or emergent cause of symptoms. Patient advised to followup with PCP for continued evaluation and treatment. She agrees with plan.   Results for orders placed during the hospital encounter of 12/24/12  WET PREP, GENITAL      Result Value Range   Yeast Wet Prep HPF POC NONE SEEN  NONE SEEN   Trich, Wet Prep NONE SEEN  NONE SEEN   Clue Cells Wet Prep HPF POC MODERATE (*) NONE SEEN   WBC, Wet Prep HPF POC MODERATE (*) NONE SEEN  CBC WITH DIFFERENTIAL      Result Value Range   WBC 6.4  4.0 - 10.5 K/uL   RBC 4.23  3.87 - 5.11 MIL/uL   Hemoglobin 11.2 (*) 12.0 - 15.0 g/dL   HCT 16.1 (*) 09.6 - 04.5 %   MCV 80.1  78.0 - 100.0 fL   MCH 26.5  26.0 - 34.0 pg   MCHC 33.0  30.0 - 36.0 g/dL   RDW 40.9  81.1 - 91.4 %   Platelets 293  150 - 400 K/uL   Neutrophils Relative % 55  43 - 77 %   Neutro Abs 3.5  1.7 - 7.7 K/uL   Lymphocytes Relative 37  12 - 46 %   Lymphs Abs 2.4  0.7 - 4.0 K/uL   Monocytes Relative 8  3 - 12 %   Monocytes Absolute 0.5  0.1 - 1.0 K/uL   Eosinophils Relative 1  0 - 5 %   Eosinophils Absolute 0.0  0.0 - 0.7 K/uL   Basophils Relative 0  0 - 1 %   Basophils Absolute 0.0  0.0 - 0.1 K/uL  COMPREHENSIVE METABOLIC PANEL      Result Value Range   Sodium 134 (*) 135 - 145 mEq/L   Potassium 3.6  3.5 - 5.1 mEq/L   Chloride 98  96 - 112 mEq/L   CO2 25  19 - 32 mEq/L   Glucose, Bld 105 (*) 70 - 99 mg/dL   BUN 13  6 - 23 mg/dL   Creatinine, Ser 7.82  0.50 -  1.10 mg/dL   Calcium 9.4  8.4 - 95.6 mg/dL   Total Protein 7.4  6.0 - 8.3 g/dL   Albumin 3.8  3.5 - 5.2 g/dL   AST 22  0 - 37 U/L   ALT 26  0 - 35 U/L   Alkaline Phosphatase 56  39 - 117 U/L  Total Bilirubin 0.4  0.3 - 1.2 mg/dL   GFR calc non Af Amer >90  >90 mL/min   GFR calc Af Amer >90  >90 mL/min  LIPASE, BLOOD      Result Value Range   Lipase 24  11 - 59 U/L  URINALYSIS, ROUTINE W REFLEX MICROSCOPIC      Result Value Range   Color, Urine YELLOW  YELLOW   APPearance CLOUDY (*) CLEAR   Specific Gravity, Urine 1.010  1.005 - 1.030   pH 6.5  5.0 - 8.0   Glucose, UA NEGATIVE  NEGATIVE mg/dL   Hgb urine dipstick NEGATIVE  NEGATIVE   Bilirubin Urine NEGATIVE  NEGATIVE   Ketones, ur NEGATIVE  NEGATIVE mg/dL   Protein, ur NEGATIVE  NEGATIVE mg/dL   Urobilinogen, UA 0.2  0.0 - 1.0 mg/dL   Nitrite NEGATIVE  NEGATIVE   Leukocytes, UA TRACE (*) NEGATIVE  URINE MICROSCOPIC-ADD ON      Result Value Range   Squamous Epithelial / LPF FEW (*) RARE   WBC, UA 3-6  <3 WBC/hpf   Bacteria, UA FEW (*) RARE  POCT PREGNANCY, URINE      Result Value Range   Preg Test, Ur NEGATIVE  NEGATIVE        MDM   1. Abdominal pain   2. Bacterial vaginosis        Angus Seller, PA-C 12/24/12 [phone]

## 2012-12-24 NOTE — ED Provider Notes (Signed)
Medical screening examination/treatment/procedure(s) were performed by non-physician practitioner and as supervising physician I was immediately available for consultation/collaboration.  EKG Interpretation   None         Melvyn Hommes, MD 12/24/12 0704 

## 2012-12-25 LAB — GC/CHLAMYDIA PROBE AMP
CT Probe RNA: POSITIVE — AB
GC Probe RNA: NEGATIVE

## 2012-12-25 LAB — URINE CULTURE: Colony Count: 15000

## 2012-12-26 NOTE — ED Notes (Signed)
Chart sent to EDP office for review.+[email or web address]

## 2013-01-01 ENCOUNTER — Telehealth (HOSPITAL_BASED_OUTPATIENT_CLINIC_OR_DEPARTMENT_OTHER): Payer: Self-pay | Admitting: *Deleted

## 2013-01-01 NOTE — ED Notes (Signed)
Chart returned from EDP office with orders for Zithromax 1 gram po x 1 dose written by Kerrie Buffalo RNA/FNP

## 2013-01-01 NOTE — ED Notes (Signed)
Patient informed of positive results after id'd x 2 and informed of need to notify partner to be treated.Rx for Zithromax 1 gram po x 1 dose needs to be called to [facility] .[phone]

## 2013-01-13 ENCOUNTER — Inpatient Hospital Stay (HOSPITAL_COMMUNITY): Payer: Self-pay

## 2013-01-13 ENCOUNTER — Inpatient Hospital Stay (HOSPITAL_COMMUNITY)
Admission: AD | Admit: 2013-01-13 | Discharge: 2013-01-13 | Disposition: A | Discharge status: Home / Self Care | Payer: BC Managed Care – PPO | Source: Ambulatory Visit | Attending: Family Medicine | Admitting: Family Medicine

## 2013-01-13 ENCOUNTER — Encounter (HOSPITAL_COMMUNITY): Payer: Self-pay

## 2013-01-13 DIAGNOSIS — N739 Female pelvic inflammatory disease, unspecified: Secondary | ICD-10-CM | POA: Insufficient documentation

## 2013-01-13 DIAGNOSIS — E282 Polycystic ovarian syndrome: Secondary | ICD-10-CM

## 2013-01-13 DIAGNOSIS — R109 Unspecified abdominal pain: Secondary | ICD-10-CM | POA: Insufficient documentation

## 2013-01-13 DIAGNOSIS — A5619 Other chlamydial genitourinary infection: Secondary | ICD-10-CM | POA: Insufficient documentation

## 2013-01-13 DIAGNOSIS — N949 Unspecified condition associated with female genital organs and menstrual cycle: Secondary | ICD-10-CM | POA: Insufficient documentation

## 2013-01-13 DIAGNOSIS — N926 Irregular menstruation, unspecified: Secondary | ICD-10-CM | POA: Insufficient documentation

## 2013-01-13 DIAGNOSIS — A749 Chlamydial infection, unspecified: Secondary | ICD-10-CM

## 2013-01-13 DIAGNOSIS — R102 Pelvic and perineal pain: Secondary | ICD-10-CM

## 2013-01-13 HISTORY — DX: Polycystic ovarian syndrome: E28.2

## 2013-01-13 LAB — POCT PREGNANCY, URINE: Preg Test, Ur: NEGATIVE

## 2013-01-13 LAB — URINALYSIS, ROUTINE W REFLEX MICROSCOPIC
Bilirubin Urine: NEGATIVE
Glucose, UA: NEGATIVE mg/dL
Ketones, ur: NEGATIVE mg/dL
Leukocytes, UA: NEGATIVE
Nitrite: NEGATIVE
Protein, ur: NEGATIVE mg/dL
Specific Gravity, Urine: 1.02 (ref 1.005–1.030)
Urobilinogen, UA: 0.2 mg/dL (ref 0.0–1.0)
pH: 6.5 (ref 5.0–8.0)

## 2013-01-13 LAB — WET PREP, GENITAL
Clue Cells Wet Prep HPF POC: NONE SEEN
Trich, Wet Prep: NONE SEEN
Yeast Wet Prep HPF POC: NONE SEEN

## 2013-01-13 LAB — URINE MICROSCOPIC-ADD ON

## 2013-01-13 MED ORDER — AZITHROMYCIN 500 MG PO TABS: 1000.0000 mg | ORAL_TABLET | Freq: Once | ORAL | Status: DC

## 2013-01-13 NOTE — MAU Note (Signed)
Patient states she has had abdominal pain for 3-4 months but has gotten worse over the past 3-4 days. Has nausea, no vomiting. Denies bleeding or discharge. States she has a history of irregular periods and does not remember the last one.

## 2013-01-13 NOTE — Discharge Instructions (Signed)
Chlamydia, Female Chlamydia is an infection caused by bacteria. It is spread through sexual contact. Chlamydia can be in different areas of the body. These areas include the cervix, urethra, throat, or rectum. If you are infected, you must finish all treatments and follow up with a caregiver.  CAUSES  Chlamydia is a sexually transmitted disease. It is passed from an infected partner during intimate contact. This contact could be with the genitals, mouth, or rectal area. Infections can also be passed from mothers to babies during birth. SYMPTOMS  There may not be any symptoms. This is often the case early in the infection. Symptoms you may notice include:  Mild pain and discomfort when urinating.  Inflammation of the rectum.  Vaginal discharge.  Painful intercourse.  Abdominal pain.  Bleeding between menstrual periods. DIAGNOSIS  To diagnose this infection, your caregiver will do a pelvic exam. Cultures will be taken of the vagina, cervix, urine, and possibly the rectum to see if the infection is chlamydia. TREATMENT You will be given antibiotic medicines. Any sexual partners should also be treated, even if they do not show symptoms. Take the medicine for the prescribed length of time. If you are pregnant, do not take tetracycline-type antibiotics. HOME CARE INSTRUCTIONS   Take your antibiotics as directed. Finish them even if you start to feel better.  Only take over-the-counter or prescription medicines for pain, discomfort, or fever as directed by your caregiver.  Inform any sexual partners about the infection. They should be treated also.  Do not have sexual contact until your caregiver tells you it is okay.  Get plenty of rest.  Eat a well-balanced diet, and drink enough fluids to keep your urine clear or pale yellow.  Keep all follow-up appointments and tests. SEEK IMMEDIATE MEDICAL CARE IF:   Your symptoms return.  You have a fever. MAKE SURE YOU:   Understand these  instructions.  Will watch your condition.  Will get help right away if you are not doing well or get worse. Document Released: 10/07/2004 Document Revised: 03/22/2011 Document Reviewed: 08/16/2007 [facility] Patient Information 2014 [place], [place].  Polycystic Ovarian Syndrome Polycystic ovarian syndrome is a condition with a number of problems. One problem is with the ovaries. The ovaries are organs located in the female pelvis, on each side of the uterus. Usually, during the menstrual cycle, an egg is released from 1 ovary every month. This is called ovulation. When the egg is fertilized, it goes into the womb (uterus), which allows for the growth of a baby. The egg travels from the ovary through the fallopian tube to the uterus. The ovaries also make the hormones estrogen and progesterone. These hormones help the development of a woman's breasts, body shape, and body hair. They also regulate the menstrual cycle and pregnancy. Sometimes, cysts form in the ovaries. A cyst is a fluid-filled sac. On the ovary, different types of cysts can form. The most common type of ovarian cyst is called a functional or ovulation cyst. It is normal, and often forms during the normal menstrual cycle. Each month, a woman's ovaries grow tiny cysts that hold the eggs. When an egg is fully grown, the sac breaks open. This releases the egg. Then, the sac which released the egg from the ovary dissolves. In one type of functional cyst, called a follicle cyst, the sac does not break open to release the egg. It may actually continue to grow. This type of cyst usually disappears within 1 to 3 months.  One type  of cyst problem with the ovaries is called Polycystic Ovarian Syndrome (PCOS). In this condition, many follicle cysts form, but do not rupture and produce an egg. This health problem can affect the following: Menstrual cycle. Heart. Obesity. Cancer of the uterus. Fertility. Blood vessels. Hair growth (face and body) or  baldness. Hormones. Appearance. High blood pressure. Stroke. Insulin production. Inflammation of the liver. Elevated blood cholesterol and triglycerides. CAUSES  No one knows the exact cause of PCOS. Women with PCOS often have a mother or sister with PCOS. There is not yet enough proof to say this is inherited. Many women with PCOS have a weight problem. Researchers are looking at the relationship between PCOS and the body's ability to make insulin. Insulin is a hormone that regulates the change of sugar, starches, and other food into energy for the body's use, or for storage. Some women with PCOS make too much insulin. It is possible that the ovaries react by making too many female hormones, called androgens. This can lead to acne, excessive hair growth, weight gain, and ovulation problems. Too much production of luteinizing hormone (LH) from the pituitary gland in the brain stimulates the ovary to produce too much female hormone (androgen). SYMPTOMS  Infrequent or no menstrual periods, and/or irregular bleeding. Inability to get pregnant (infertility), because of not ovulating. Increased growth of hair on the face, chest, stomach, back, thumbs, thighs, or toes. Acne, oily skin, or dandruff. Pelvic pain. Weight gain or obesity, usually carrying extra weight around the waist. Type 2 diabetes (this is the diabetes that usually does not need insulin). High cholesterol. High blood pressure. Female-pattern baldness or thinning hair. Patches of thickened and dark brown or black skin on the neck, arms, breasts, or thighs. Skin tags, or tiny excess flaps of skin, in the armpits or neck area. Sleep apnea (excessive snoring and breathing stops at times while asleep). Deepening of the voice. Gestational diabetes when pregnant. Increased risk of miscarriage with pregnancy. DIAGNOSIS  There is no single test to diagnose PCOS.  Your caregiver will: Take a medical history. Perform a pelvic  exam. Perform an ultrasound. Check your female and female hormone levels. Measure glucose or sugar levels in the blood. Do other blood tests. If you are producing too many female hormones, your caregiver will make sure it is from PCOS. At the physical exam, your caregiver will want to evaluate the areas of increased hair growth. Try to allow natural hair growth for a few days before the visit. During a pelvic exam, the ovaries may be enlarged or swollen by the increased number of small cysts. This can be seen more easily by vaginal ultrasound or screening, to examine the ovaries and lining of the uterus (endometrium) for cysts. The uterine lining may become thicker, if there has not been a regular period. TREATMENT  Because there is no cure for PCOS, it needs to be managed to prevent problems. Treatments are based on your symptoms. Treatment is also based on whether you want to have a baby or whether you need contraception.  Treatment may include: Progesterone hormone, to start a menstrual period. Birth control pills, to make you have regular menstrual periods. Medicines to make you ovulate, if you want to get pregnant. Medicines to control your insulin. Medicine to control your blood pressure. Medicine and diet, to control your high cholesterol and triglycerides in your blood. Surgery, making small holes in the ovary, to decrease the amount of female hormone production. This is done through a long,  lighted tube (laparoscope), placed into the pelvis through a tiny incision in the lower abdomen. Your caregiver will go over some of the choices with you. WOMEN WITH PCOS HAVE THESE CHARACTERISTICS: High levels of female hormones called androgens. An irregular or no menstrual cycle. May have many small cysts in their ovaries. PCOS is the most common hormonal reproductive problem in women of childbearing age. WHY DO WOMEN WITH PCOS HAVE TROUBLE WITH THEIR MENSTRUAL CYCLE? Each month, about 20 eggs start to  mature in the ovaries. As one egg grows and matures, the follicle breaks open to release the egg, so it can travel through the fallopian tube for fertilization. When the single egg leaves the follicle, ovulation takes place. In women with PCOS, the ovary does not make all of the hormones it needs for any of the eggs to fully mature. They may start to grow and accumulate fluid, but no one egg becomes large enough. Instead, some may remain as cysts. Since no egg matures or is released, ovulation does not occur and the hormone progesterone is not made. Without progesterone, a woman's menstrual cycle is irregular or absent. Also, the cysts produce female hormones, which continue to prevent ovulation.  Document Released: 04/23/2004 Document Revised: 03/22/2011 Document Reviewed: 06/15/2012 [facility] Patient Information 2014 [place], [place].

## 2013-01-13 NOTE — MAU Note (Signed)
Pt here for generalized abdominal pain x3-4 months that has been intermittent. In the last two days, pain has been constant. Hx acid reflux. Denies bleeding or vag d/c changes.

## 2013-01-13 NOTE — MAU Provider Note (Signed)
Attestation of Attending Supervision of Advanced Practitioner (PA/CNM/NP): Evaluation and management procedures were performed by the Advanced Practitioner under my supervision and collaboration.  I have reviewed the Advanced Practitioner's note and chart, and I agree with the management and plan.  Reva BoresPRATT,Budd Freiermuth S, MD [facility] Attending 01/13/2013 7:56 PM

## 2013-01-13 NOTE — MAU Provider Note (Signed)
Chief Complaint: Abdominal Pain   First Provider Initiated Contact with Patient 01/13/13 1651     SUBJECTIVE HPI: Diane Lloyd is a 23 y.o. G0P0 who presents to maternity admissions reporting abdominal pain described as severe cramping starting yesterday.  She has dx of PCOS and has irregular menses with cramping but the pain seems worse today than usual and she is not bleeding.  She and her partner were treated for chlamydia 2 weeks ago but had sex within the first week of treatment.  She denies vaginal bleeding, vaginal itching/burning, urinary symptoms, h/a, dizziness, n/v, or fever/chills.   Past Medical History  Diagnosis Date  . Anemia     iron def  . Obesity, unspecified   . Lupus   . Asthma   . PCOS (polycystic ovarian syndrome)    Past Surgical History  Procedure Laterality Date  . Tonsillectomy     History   Social History  . Marital Status: Single    Spouse Name: N/A    Number of Children: N/A  . Years of Education: N/A   Occupational History  . Not on file.   Social History Main Topics  . Smoking status: Never Smoker   . Smokeless tobacco: Never Used  . Alcohol Use: No  . Drug Use: No  . Sexual Activity: Yes    Birth Control/ Protection: None   Other Topics Concern  . Not on file   Social History Narrative  . No narrative on file   No current facility-administered medications on file prior to encounter.   Current Outpatient Prescriptions on File Prior to Encounter  Medication Sig Dispense Refill  . metroNIDAZOLE (FLAGYL) 500 MG tablet Take 1 tablet (500 mg total) by mouth 2 (two) times daily. One po bid x 7 days  14 tablet  0   No Known Allergies  ROS: Pertinent items in HPI  OBJECTIVE Blood pressure 134/78, pulse 78, temperature 98.2 F (36.8 C), temperature source Oral, resp. rate 18, last menstrual period 12/18/2012, SpO2 100.00%. GENERAL: Well-developed, well-nourished female in no acute distress.  HEENT: Normocephalic HEART: normal  rate RESP: normal effort ABDOMEN: Soft, non-tender EXTREMITIES: Nontender, no edema NEURO: Alert and oriented Pelvic exam: Cervix pink, visually closed, friable to cotton swab, moderate white/yellow thick discharge, vaginal walls and external genitalia normal Bimanual exam: Cervix 0/long/high, firm, anterior, neg CMT, uterus mildly tender, nonenlarged, adnexa without tenderness, enlargement, or mass  LAB RESULTS Results for orders placed during the hospital encounter of 01/13/13 (from the past 24 hour(s))  URINALYSIS, ROUTINE W REFLEX MICROSCOPIC     Status: Abnormal   Collection Time    01/13/13  4:10 PM      Result Value Range   Color, Urine YELLOW  YELLOW   APPearance CLEAR  CLEAR   Specific Gravity, Urine 1.020  1.005 - 1.030   pH 6.5  5.0 - 8.0   Glucose, UA NEGATIVE  NEGATIVE mg/dL   Hgb urine dipstick TRACE (*) NEGATIVE   Bilirubin Urine NEGATIVE  NEGATIVE   Ketones, ur NEGATIVE  NEGATIVE mg/dL   Protein, ur NEGATIVE  NEGATIVE mg/dL   Urobilinogen, UA 0.2  0.0 - 1.0 mg/dL   Nitrite NEGATIVE  NEGATIVE   Leukocytes, UA NEGATIVE  NEGATIVE  URINE MICROSCOPIC-ADD ON     Status: None   Collection Time    01/13/13  4:10 PM      Result Value Range   Squamous Epithelial / LPF RARE  RARE   RBC / HPF 0-2  <  3 RBC/hpf   Urine-Other MUCOUS PRESENT    POCT PREGNANCY, URINE     Status: None   Collection Time    01/13/13  4:25 PM      Result Value Range   Preg Test, Ur NEGATIVE  NEGATIVE  WET PREP, GENITAL     Status: Abnormal   Collection Time    01/13/13  4:58 PM      Result Value Range   Yeast Wet Prep HPF POC NONE SEEN  NONE SEEN   Trich, Wet Prep NONE SEEN  NONE SEEN   Clue Cells Wet Prep HPF POC NONE SEEN  NONE SEEN   WBC, Wet Prep HPF POC MANY (*) NONE SEEN    IMAGING [place] Transvaginal Non-ob  01/13/2013   CLINICAL DATA:  Intermittent pelvic pain. LMP 12/18/2012. History of PCOS.  EXAM: TRANSABDOMINAL ULTRASOUND OF PELVIS  TECHNIQUE: Transabdominal ultrasound examination  of the pelvis was performed including evaluation of the uterus, ovaries, adnexal regions, and pelvic cul-de-sac.  COMPARISON:  Pelvic ultrasound 11/05/2011  FINDINGS: Uterus  Measurements: 7.0 x 4.0 x 6.1 cm. Retroflexed. Normal in echotexture. No focal uterine masses identified. No fibroids or other mass visualized.  Endometrium  Thickness: The normal appearing endometrium measures a maximum thickness of 7 mm. In the fundal endometrium is an polypoid area of increased echogenicity that demonstrates focal vascularity. This echogenic masslike area measures 7 x 10 x 11 mm and is to the left of midline on coronal images.  Right ovary  Measurements: 3.4 x 1.9 x 2.6 cm. Contains multiple similarly sized, small peripherally oriented follicles and prominent central stroma. No adnexal mass.  Left ovary  Measurements: 4.0 x 1.5 x 2.2 cm. Contains multiple similarly size small peripheral follicles and prominent central stroma. No adnexal mass.  Other findings:  No free fluid  IMPRESSION: 1. Cannot exclude an endometrial polyp at the level of the fundus, to the left of midline. 2. The ovaries have sonographic appearance is compatible with diagnosis of PCOS.   Electronically Signed   By: Britta MccreedySusan  Turner M.D.   On: 01/13/2013 18:49   [place] Pelvis Complete  01/13/2013   CLINICAL DATA:  Intermittent pelvic pain. LMP 12/18/2012. History of PCOS.  EXAM: TRANSABDOMINAL ULTRASOUND OF PELVIS  TECHNIQUE: Transabdominal ultrasound examination of the pelvis was performed including evaluation of the uterus, ovaries, adnexal regions, and pelvic cul-de-sac.  COMPARISON:  Pelvic ultrasound 11/05/2011  FINDINGS: Uterus  Measurements: 7.0 x 4.0 x 6.1 cm. Retroflexed. Normal in echotexture. No focal uterine masses identified. No fibroids or other mass visualized.  Endometrium  Thickness: The normal appearing endometrium measures a maximum thickness of 7 mm. In the fundal endometrium is an polypoid area of increased echogenicity that demonstrates  focal vascularity. This echogenic masslike area measures 7 x 10 x 11 mm and is to the left of midline on coronal images.  Right ovary  Measurements: 3.4 x 1.9 x 2.6 cm. Contains multiple similarly sized, small peripherally oriented follicles and prominent central stroma. No adnexal mass.  Left ovary  Measurements: 4.0 x 1.5 x 2.2 cm. Contains multiple similarly size small peripheral follicles and prominent central stroma. No adnexal mass.  Other findings:  No free fluid  IMPRESSION: 1. Cannot exclude an endometrial polyp at the level of the fundus, to the left of midline. 2. The ovaries have sonographic appearance is compatible with diagnosis of PCOS.   Electronically Signed   By: Britta MccreedySusan  Turner M.D.   On: 01/13/2013 18:49    ASSESSMENT 1.  Polycystic ovarian syndrome   2. Chlamydia   3. Pelvic pain in female     PLAN Discharge home Azithromycin 1000 mg prescription Continue NSAIDs for pain F/U in Gyn clinic for PCOS/abdominal pain/contraception Return to MAU if symptoms persist or worsen    Medication List    STOP taking these medications       metroNIDAZOLE 500 MG tablet  Commonly known as:  FLAGYL      TAKE these medications       ALEVE 220 MG Caps  Generic drug:  Naproxen Sodium  Take 1 capsule by mouth as needed (headache).     azithromycin 500 MG tablet  Commonly known as:  ZITHROMAX  Take 2 tablets (1,000 mg total) by mouth once.       Follow-up Information   Schedule an appointment as soon as possible for a visit with [facility]. (Please go to clinic to fill out financial paperwork anytime during office hours.  Call the clinic for an appointment to follow up on PCOS and abdominal pain.  Return to MAU if symptoms worsen)    Specialty:  Obstetrics and Gynecology   Contact information:   [address] [place] [address] [phone]      Sharen Counter Certified Nurse-Midwife 01/13/2013  7:27 PM

## 2013-03-23 ENCOUNTER — Ambulatory Visit (INDEPENDENT_AMBULATORY_CARE_PROVIDER_SITE_OTHER): Payer: Self-pay | Admitting: Sports Medicine

## 2013-03-23 VITALS — BP 137/83 | HR 76 | Temp 98.1°F | Ht 69.25 in | Wt 253.4 lb

## 2013-03-23 DIAGNOSIS — K219 Gastro-esophageal reflux disease without esophagitis: Secondary | ICD-10-CM

## 2013-03-23 DIAGNOSIS — M329 Systemic lupus erythematosus, unspecified: Secondary | ICD-10-CM | POA: Insufficient documentation

## 2013-03-23 DIAGNOSIS — R072 Precordial pain: Secondary | ICD-10-CM

## 2013-03-23 DIAGNOSIS — IMO0002 Reserved for concepts with insufficient information to code with codable children: Secondary | ICD-10-CM | POA: Insufficient documentation

## 2013-03-23 DIAGNOSIS — R0789 Other chest pain: Secondary | ICD-10-CM

## 2013-03-23 MED ORDER — PANTOPRAZOLE SODIUM 40 MG PO TBEC: 40.0000 mg | DELAYED_RELEASE_TABLET | Freq: Every day | ORAL | Status: DC

## 2013-03-23 NOTE — Patient Instructions (Signed)

## 2013-03-23 NOTE — Assessment & Plan Note (Addendum)
Problem Based Documentation:    Subjective Report:  2 days duration.  Seems to be worse at night.  Significantly worsened after eating spicy chicken.  Moderate intensity.  Trying ibuprofen/Aleve on occasion.  This does not help  Not taking reflux medications  Nonradiating, nonexertional.  No tachypalpitations  No associated diaphoresis.  No associated cough or voice changes     Assessment & Plan & Follow up Issues:  Subacute condition Likely reflective of her underlying untreated GERD but could consider other etiologies including cardiac however significant prior workup that was unrevealing and no other associated cardiac symptoms.  Also consider SLE as etiology 1. PPI 2. GERD diet 3. Avoid NSAIDs 4. Reviewed red flags for return if not improving > Consider EKG, chest x-ray and repeat echo to evaluate for potential cardiomyopathy if persistent or worsening.  >50% of this 25 minute visit spent in direct patient counseling and/or coordination of care.

## 2013-03-23 NOTE — Progress Notes (Signed)
  Diane Lloyd - 23 y.o. female MRN [id number]  Date of birth: 10-06-1990  SUBJECTIVE:  Pt is here today with a chief complaint of: chest pain/body aches   She reports 48 hours of worsening mid sternal pain worse at night For further subjective including (HPI, Interval History & ROS) please see problem based charting  HISTORY: Wt Readings from Last 3 Encounters:  03/23/13 253 lb 6.4 oz (114.941 kg)  12/23/12 248 lb (112.492 kg)  11/27/12 245 lb (111.131 kg)   BP Readings from Last 3 Encounters:  03/23/13 137/83  01/13/13 134/78  12/23/12 159/68    History  Smoking status  . Never Smoker   Smokeless tobacco  . Never Used   Health Maintenance Due  Topic  . Chlamydia Screening   . Pap Smear   . Influenza Vaccine     History significant for SLE and, maternal cardiac abnormality with prior workup from cardiology that was unrevealing.  None significant GERD that has been untreated. Otherwise past Medical, Surgical, Social, and Family History Reviewed per EMR Medications and Allergies reviewed and updated per below.  VITALS: BP 137/83  Pulse 76  Temp(Src) 98.1 F (36.7 C) (Oral)  Ht 5' 9.25" (1.759 m)  Wt 253 lb 6.4 oz (114.941 kg)  BMI 37.15 kg/m2  LMP 10/23/2012  PHYSICAL EXAM: GENERAL:  young adult, obese, African American female. In no discomfort; no respiratory distress  PSYCH: alert and appropriate, good insight   HNEENT:  no JVD, no posterior or pharyngeal erythema, voice is clear   CARDIO: RRR, S1/S2 heard, no murmur  LUNGS: CTA B, no wheezes, no crackles  ABDOMEN: +BS, soft, non-tender, no rigidity, no guarding, no masses/hepatosplenomegaly  EXTREM:  Warm, well perfused.  Moves all 4 extremities spontaneously; no lateralization.  Pedal pulses 2+/4.  no pretibial edema.  GU:   SKIN:  no significant rash     MEDICATIONS, LABS & OTHER ORDERS: Previous Medications   No medications on file   Modified Medications   No medications on file   New Prescriptions    PANTOPRAZOLE (PROTONIX) 40 MG TABLET    Take 1 tablet (40 mg total) by mouth daily.   Discontinued Medications   AZITHROMYCIN (ZITHROMAX) 500 MG TABLET    Take 2 tablets (1,000 mg total) by mouth once.   NAPROXEN SODIUM (ALEVE) 220 MG CAPS    Take 1 capsule by mouth as needed (headache).  No orders of the defined types were placed in this encounter.   ASSESSMENT & PLAN: See problem based charting & AVS for pt instructions.

## 2013-03-23 NOTE — Assessment & Plan Note (Signed)
Acute on chronic condition 1. Start PPI for 4 months 2. Avoid NSAIDs 3. GERD diet > Discussed reasons for return including tachycardia palpitations, dizziness, lightheadedness that could be more indicative of cardiac origin however given the prior workup and presentation consistent with GERD  .

## 2013-04-23 ENCOUNTER — Ambulatory Visit (INDEPENDENT_AMBULATORY_CARE_PROVIDER_SITE_OTHER): Payer: Self-pay | Admitting: Emergency Medicine

## 2013-04-23 ENCOUNTER — Encounter: Payer: Self-pay | Admitting: Emergency Medicine

## 2013-04-23 VITALS — BP 135/84 | HR 84 | Temp 98.8°F | Ht 69.0 in | Wt 254.0 lb

## 2013-04-23 DIAGNOSIS — J029 Acute pharyngitis, unspecified: Secondary | ICD-10-CM

## 2013-04-23 MED ORDER — LORATADINE 10 MG PO TABS: 10.0000 mg | ORAL_TABLET | Freq: Every day | ORAL | Status: DC

## 2013-04-23 MED ORDER — PANTOPRAZOLE SODIUM 40 MG PO TBEC: 40.0000 mg | DELAYED_RELEASE_TABLET | Freq: Two times a day (BID) | ORAL | Status: DC

## 2013-04-23 NOTE — Assessment & Plan Note (Signed)
Progression of symptoms leads me towards reflux. Could also be allergies or a viral pharyngitis. Will increase protonix to BID for 2 weeks. Loratadine for 2 weeks. Return precautions as in AVS. F/u in 2 weeks if not improving.

## 2013-04-23 NOTE — Progress Notes (Signed)
   Subjective:    Patient ID: Diane Lloyd, female    DOB: 08-23-1990, 23 y.o.   MRN: [id number]  HPI Diane Lloyd is here for a SDA for throat problems.  She states that about 4 days ago she started having some trouble swallowing, particularly large pieces of food.  2 days ago she developed a sore throat and dry cough.  This morning, she noted some hoarseness.  No nasal discharge.  No fevers.  No sick contacts.  No heartburn or abdominal pain.  She is wondering if this might be her reflux or allergies.  Current Outpatient Prescriptions on File Prior to Visit  Medication Sig Dispense Refill  . pantoprazole (PROTONIX) 40 MG tablet Take 1 tablet (40 mg total) by mouth daily.  30 tablet  3   No current facility-administered medications on file prior to visit.    I have reviewed and updated the following as appropriate: allergies and current medications SHx: non smoker   Review of Systems See HPI    Objective:   Physical Exam BP 135/84  Pulse 84  Temp(Src) 98.8 F (37.1 C) (Oral)  Ht 5\' 9"  (1.753 m)  Wt 254 lb (115.214 kg)  BMI 37.49 kg/m2 Gen: alert, cooperative, NAD HEENT: AT/[place], sclera white, MMM, no cobblestoning or pharyngeal erythema or exudate Neck: supple, no LAD CV: RRR, no murmurs Pulm: CTAB, no wheezes or rales Abd: +BS, soft, NTND     Assessment & Plan:

## 2013-04-23 NOTE — Patient Instructions (Signed)
It was nice to meet you!  I think you are right - your throat symptoms are likely from a combination of reflux and allergies.  Please take your reflux medicine twice a day for the next 2 weeks. Take the loratadine 1 pill daily for the next 2-4 weeks.  If you develop new symptoms (like fever, belly pain, unable to drink fluids) or are just not improving after 2 weeks, please come back.

## 2013-05-12 ENCOUNTER — Emergency Department (INDEPENDENT_AMBULATORY_CARE_PROVIDER_SITE_OTHER)
Admission: EM | Admit: 2013-05-12 | Discharge: 2013-05-12 | Disposition: A | Discharge status: Home / Self Care | Payer: Self-pay | Source: Home / Self Care | Attending: Family Medicine | Admitting: Family Medicine

## 2013-05-12 ENCOUNTER — Encounter (HOSPITAL_COMMUNITY): Payer: Self-pay | Admitting: Emergency Medicine

## 2013-05-12 DIAGNOSIS — J302 Other seasonal allergic rhinitis: Secondary | ICD-10-CM

## 2013-05-12 DIAGNOSIS — J309 Allergic rhinitis, unspecified: Secondary | ICD-10-CM

## 2013-05-12 MED ORDER — FLUTICASONE PROPIONATE 50 MCG/ACT NA SUSP: 1.0000 | Freq: Two times a day (BID) | NASAL | Status: DC

## 2013-05-12 MED ORDER — CETIRIZINE HCL 10 MG PO TABS: 10.0000 mg | ORAL_TABLET | Freq: Every day | ORAL | Status: DC

## 2013-05-12 NOTE — ED Provider Notes (Signed)
CSN: [id number]     Arrival date & time 05/12/13  1121 History   First MD Initiated Contact with Patient 05/12/13 1217     Chief Complaint  Patient presents with  . URI   (Consider location/radiation/quality/duration/timing/severity/associated sxs/prior Treatment) Patient is a 23 y.o. female presenting with URI. The history is provided by the patient.  URI Presenting symptoms: congestion, cough and rhinorrhea   Presenting symptoms: no fever   Severity:  Mild Onset quality:  Gradual Duration:  5 days Progression:  Unchanged Chronicity:  New Relieved by:  None tried Worsened by:  Nothing tried Ineffective treatments:  None tried Associated symptoms: sneezing   Associated symptoms: no wheezing     Past Medical History  Diagnosis Date  . Anemia     iron def  . Obesity, unspecified   . Lupus     + ANA in 2012  . Asthma   . PCOS (polycystic ovarian syndrome)    Past Surgical History  Procedure Laterality Date  . Tonsillectomy     Family History  Problem Relation Age of Onset  . Hypertension Mother   . Lupus Mother   . Diabetes Mother   . Cancer Father [id number]    esophagus  . Asthma Father   . Cancer Maternal Uncle 40    pancreatic cancer  . Cancer Paternal Grandmother 3960    esophageal   History  Substance Use Topics  . Smoking status: Never Smoker   . Smokeless tobacco: Never Used  . Alcohol Use: No   OB History   Grav Para Term Preterm Abortions TAB SAB Ect Mult Living   0              Review of Systems  Constitutional: Negative for fever and chills.  HENT: Positive for congestion, postnasal drip, rhinorrhea and sneezing.   Respiratory: Positive for cough. Negative for wheezing.     Allergies  Review of patient's allergies indicates no known allergies.  Home Medications   Prior to Admission medications   Medication Sig Start Date End Date Taking? Authorizing Provider  loratadine (CLARITIN) 10 MG tablet Take 1 tablet (10 mg total) by mouth daily. 04/23/13    Charm RingsErin J Honig, MD  pantoprazole (PROTONIX) 40 MG tablet Take 1 tablet (40 mg total) by mouth daily. 03/23/13   Andrena MewsMichael D Rigby, DO  pantoprazole (PROTONIX) 40 MG tablet Take 1 tablet (40 mg total) by mouth 2 (two) times daily. 04/23/13   Charm RingsErin J Honig, MD   BP 137/82  Pulse 76  Temp(Src) 98.9 F (37.2 C) (Oral)  Resp 18  SpO2 98% Physical Exam  Nursing note and vitals reviewed. Constitutional: She is oriented to person, place, and time. She appears well-developed and well-nourished.  HENT:  Head: Normocephalic.  Right Ear: External ear normal.  Left Ear: External ear normal.  Nose: Mucosal edema and rhinorrhea present.  Mouth/Throat: Oropharynx is clear and moist.  Neck: Normal range of motion. Neck supple.  Cardiovascular: Normal heart sounds.   Pulmonary/Chest: Effort normal and breath sounds normal.  Lymphadenopathy:    She has no cervical adenopathy.  Neurological: She is alert and oriented to person, place, and time.  Skin: Skin is warm and dry.    ED Course  Procedures (including critical care time) Labs Review Labs Reviewed - No data to display  Imaging Review No results found.   MDM   1. Seasonal allergic rhinitis        Linna HoffJames D Torina Ey, MD 05/12/13 1228

## 2013-05-12 NOTE — ED Notes (Signed)
Pt  Reports  Symptoms  Of     Stuffy  Nose       sorethroat       As  Well   As  Nasal  Congestion           With  Symptoms  X  5  Days            Pt  Is  Sitting  Upright on  Exam table  Speaking in  Complete  sentances  And  Is  In no  Acute  Distress

## 2013-05-17 ENCOUNTER — Encounter: Payer: Self-pay | Admitting: Family Medicine

## 2013-05-17 ENCOUNTER — Ambulatory Visit (INDEPENDENT_AMBULATORY_CARE_PROVIDER_SITE_OTHER): Payer: Self-pay | Admitting: Family Medicine

## 2013-05-17 VITALS — BP 130/80 | HR 73 | Temp 98.7°F | Ht 69.0 in | Wt 255.0 lb

## 2013-05-17 DIAGNOSIS — J309 Allergic rhinitis, unspecified: Secondary | ICD-10-CM | POA: Insufficient documentation

## 2013-05-17 NOTE — Progress Notes (Signed)
   Subjective:    Patient ID: Diane Lloyd, female    DOB: 25-Oct-1990, 23 y.o.   MRN: [id number]  HPI  Allergic rhinitis:  Patient presents today to the family medicine clinic for same-day appointment with complaints of congestion, cough with mucous production and headache. She states the symptoms started early last week and she was seen in the urgent care and prescribed Flonase and Zyrtec. She states she has not started these medications because she didn't think she had allergies. She denies nausea, vomiting, diarrhea, shortness of breath, facial pressure, fatigue or fever. She is tolerating meals and fluids.  Review of Systems Negative, with the exception of above mentioned in HPI  Objective:   Physical Exam BP 130/80  Pulse 73  Temp(Src) 98.7 F (37.1 C) (Oral)  Ht 5\' 9"  (1.753 m)  Wt 255 lb (115.667 kg)  BMI 37.64 kg/m2  SpO2 99% Gen: Pleasant, obese, African American female. Well developed, well nourished, no acute distress and nontoxic in appearance. HEENT: AT. [place]. Bilateral TM visualized and normal in appearance. Bilateral eyes without injections or icterus. MMM. Bilateral nares with erythema and swelling, or drainage noted. Throat without erythema or exudates. No lymphadenopathy.  CV: RRR, no murmur clicks gallops or rubs Chest: CTAB, no wheeze or crackles Skin: No rashes, purpura or petechiae.

## 2013-05-17 NOTE — Patient Instructions (Signed)
Allergic Rhinitis Allergic rhinitis is when the mucous membranes in the nose respond to allergens. Allergens are particles in the air that cause your body to have an allergic reaction. This causes you to release allergic antibodies. Through a chain of events, these eventually cause you to release histamine into the blood stream. Although meant to protect the body, it is this release of histamine that causes your discomfort, such as frequent sneezing, congestion, and an itchy, runny nose.  CAUSES  Seasonal allergic rhinitis (hay fever) is caused by pollen allergens that may come from grasses, trees, and weeds. Year-round allergic rhinitis (perennial allergic rhinitis) is caused by allergens such as house dust mites, pet dander, and mold spores.  SYMPTOMS   Nasal stuffiness (congestion).  Itchy, runny nose with sneezing and tearing of the eyes. DIAGNOSIS  Your health care provider can help you determine the allergen or allergens that trigger your symptoms. If you and your health care provider are unable to determine the allergen, skin or blood testing may be used. TREATMENT  Allergic Rhinitis does not have a cure, but it can be controlled by:  Medicines and allergy shots (immunotherapy).  Avoiding the allergen. Hay fever may often be treated with antihistamines in pill or nasal spray forms. Antihistamines block the effects of histamine. There are over-the-counter medicines that may help with nasal congestion and swelling around the eyes. Check with your health care provider before taking or giving this medicine.  If avoiding the allergen or the medicine prescribed do not work, there are many new medicines your health care provider can prescribe. Stronger medicine may be used if initial measures are ineffective. Desensitizing injections can be used if medicine and avoidance does not work. Desensitization is when a patient is given ongoing shots until the body becomes less sensitive to the allergen.  Make sure you follow up with your health care provider if problems continue. HOME CARE INSTRUCTIONS It is not possible to completely avoid allergens, but you can reduce your symptoms by taking steps to limit your exposure to them. It helps to know exactly what you are allergic to so that you can avoid your specific triggers. SEEK MEDICAL CARE IF:   You have a fever.  You develop a cough that does not stop easily (persistent).  You have shortness of breath.  You start wheezing.  Symptoms interfere with normal daily activities. Document Released: 09/22/2000 Document Revised: 10/18/2012 Document Reviewed: 09/04/2012 [facility] Patient Information 2014 [place], [place].   Please take the Zyrtec and Flonase as prescribed to you by the urgent care. If you're not feeling better in 7 days or feeling worse and make an appointment With your provider.

## 2013-05-17 NOTE — Assessment & Plan Note (Addendum)
Patient has signs and symptoms of allergic rhinitis. I do not feel she has a current sinus infection. Advised her to start prescription she was given in urgent care and Flonase and Zyrtec daily. Discussed daily antihistamine use, allergies, allergic rhinitis versus sinus infection. Patient is in understanding of plan and diagnosis. She is to followup as needed, or sooner if she is not feeling better or becomes worse, after taking medications for 5 days.

## 2013-05-28 ENCOUNTER — Ambulatory Visit: Payer: Self-pay | Admitting: Family Medicine

## 2013-07-26 ENCOUNTER — Emergency Department (HOSPITAL_COMMUNITY)
Admission: EM | Admit: 2013-07-26 | Discharge: 2013-07-27 | Disposition: A | Discharge status: Home / Self Care | Payer: Medicaid Other | Attending: Emergency Medicine | Admitting: Emergency Medicine

## 2013-07-26 ENCOUNTER — Encounter (HOSPITAL_COMMUNITY): Payer: Self-pay | Admitting: Emergency Medicine

## 2013-07-26 DIAGNOSIS — Z3202 Encounter for pregnancy test, result negative: Secondary | ICD-10-CM | POA: Insufficient documentation

## 2013-07-26 DIAGNOSIS — E669 Obesity, unspecified: Secondary | ICD-10-CM | POA: Insufficient documentation

## 2013-07-26 DIAGNOSIS — Z8739 Personal history of other diseases of the musculoskeletal system and connective tissue: Secondary | ICD-10-CM | POA: Insufficient documentation

## 2013-07-26 DIAGNOSIS — B3731 Acute candidiasis of vulva and vagina: Secondary | ICD-10-CM | POA: Insufficient documentation

## 2013-07-26 DIAGNOSIS — Z8742 Personal history of other diseases of the female genital tract: Secondary | ICD-10-CM | POA: Insufficient documentation

## 2013-07-26 DIAGNOSIS — B373 Candidiasis of vulva and vagina: Secondary | ICD-10-CM

## 2013-07-26 DIAGNOSIS — J45909 Unspecified asthma, uncomplicated: Secondary | ICD-10-CM | POA: Insufficient documentation

## 2013-07-26 DIAGNOSIS — Z791 Long term (current) use of non-steroidal anti-inflammatories (NSAID): Secondary | ICD-10-CM | POA: Insufficient documentation

## 2013-07-26 DIAGNOSIS — Z862 Personal history of diseases of the blood and blood-forming organs and certain disorders involving the immune mechanism: Secondary | ICD-10-CM | POA: Insufficient documentation

## 2013-07-26 NOTE — ED Notes (Signed)
Pt states she has had headaches, abdominal pain and nausea x3 days.  Pt reports symptoms came on suddenly. Pt is alert, oriented and ambulatory. Rates pain 8/10 in abdomen.

## 2013-07-27 LAB — WET PREP, GENITAL: Trich, Wet Prep: NONE SEEN

## 2013-07-27 LAB — URINALYSIS, ROUTINE W REFLEX MICROSCOPIC
Bilirubin Urine: NEGATIVE
Glucose, UA: NEGATIVE mg/dL
Hgb urine dipstick: NEGATIVE
Ketones, ur: NEGATIVE mg/dL
Leukocytes, UA: NEGATIVE
Nitrite: NEGATIVE
Protein, ur: NEGATIVE mg/dL
Specific Gravity, Urine: 1.029 (ref 1.005–1.030)
Urobilinogen, UA: 0.2 mg/dL (ref 0.0–1.0)
pH: 5 (ref 5.0–8.0)

## 2013-07-27 LAB — HIV ANTIBODY (ROUTINE TESTING W REFLEX): HIV 1&2 Ab, 4th Generation: NONREACTIVE

## 2013-07-27 LAB — PREGNANCY, URINE: Preg Test, Ur: NEGATIVE

## 2013-07-27 MED ORDER — FLUCONAZOLE 150 MG PO TABS
150.0000 mg | ORAL_TABLET | Freq: Once | ORAL | Status: AC
  Administered 2013-07-27: 150 mg via ORAL
  Filled 2013-07-27: qty 1

## 2013-07-27 NOTE — Discharge Instructions (Signed)

## 2013-07-27 NOTE — ED Provider Notes (Signed)
CSN: [id number]     Arrival date & time 07/26/13  2316 History   First MD Initiated Contact with Patient 07/27/13 0052     Chief Complaint  Patient presents with  . Abdominal Pain     (Consider location/radiation/quality/duration/timing/severity/associated sxs/prior Treatment) HPI This is a 23 year old female who is lower abdominal pain for the past 3 days. She describes the pain as crampy. It is of moderate severity. There are no exacerbating or mitigating factors. There has been associated nausea but no associated vomiting, diarrhea, constipation, vaginal discharge, vaginal bleeding, fever, hematuria or dysuria.  Past Medical History  Diagnosis Date  . Anemia     iron def  . Obesity, unspecified   . Lupus     + ANA in 2012  . Asthma   . PCOS (polycystic ovarian syndrome)    Past Surgical History  Procedure Laterality Date  . Tonsillectomy     Family History  Problem Relation Age of Onset  . Hypertension Mother   . Lupus Mother   . Diabetes Mother   . Cancer Father 63    esophagus  . Asthma Father   . Cancer Maternal Uncle 40    pancreatic cancer  . Cancer Paternal Grandmother 56    esophageal   History  Substance Use Topics  . Smoking status: Never Smoker   . Smokeless tobacco: Never Used  . Alcohol Use: No   OB History   Grav Para Term Preterm Abortions TAB SAB Ect Mult Living   0              Review of Systems  All other systems reviewed and are negative.   Allergies  Review of patient's allergies indicates no known allergies.  Home Medications   Prior to Admission medications   Medication Sig Start Date End Date Taking? Authorizing Provider  naproxen sodium (ANAPROX) 220 MG tablet Take 440 mg by mouth 2 (two) times daily with a meal.   Yes Historical Provider, MD   BP 154/80  Pulse 77  Temp(Src) 97.8 F (36.6 C) (Oral)  Resp 18  SpO2 100%  Physical Exam General: Well-developed, well-nourished female in no acute distress; appearance  consistent with age of record HENT: normocephalic; atraumatic Eyes: pupils equal, round and reactive to light; extraocular muscles intact Neck: supple Heart: regular rate and rhythm; no murmurs, rubs or gallops Lungs: clear to auscultation bilaterally Abdomen: soft; nondistended; mild suprapubic tenderness; no masses or hepatosplenomegaly; bowel sounds present GU: No CVA tenderness; normal external genitalia; white vaginal discharge; no vaginal bleeding; cervix erythematous; no cervical motion tenderness; no adnexal tenderness Extremities: No deformity; full range of motion; pulses normal Neurologic: Awake, alert and oriented; motor function intact in all extremities and symmetric; no facial droop Skin: Warm and dry Psychiatric: Normal mood and affect    ED Course  Procedures (including critical care time)  MDM  Nursing notes and vitals signs, including pulse oximetry, reviewed.  Summary of this visit's results, reviewed by myself:  Labs:  Results for orders placed during the hospital encounter of 07/26/13 (from the past 24 hour(s))  URINALYSIS, ROUTINE W REFLEX MICROSCOPIC     Status: Abnormal   Collection Time    07/27/13 12:57 AM      Result Value Ref Range   Color, Urine YELLOW  YELLOW   APPearance CLOUDY (*) CLEAR   Specific Gravity, Urine 1.029  1.005 - 1.030   pH 5.0  5.0 - 8.0   Glucose, UA NEGATIVE  NEGATIVE mg/dL  Hgb urine dipstick NEGATIVE  NEGATIVE   Bilirubin Urine NEGATIVE  NEGATIVE   Ketones, ur NEGATIVE  NEGATIVE mg/dL   Protein, ur NEGATIVE  NEGATIVE mg/dL   Urobilinogen, UA 0.2  0.0 - 1.0 mg/dL   Nitrite NEGATIVE  NEGATIVE   Leukocytes, UA NEGATIVE  NEGATIVE  PREGNANCY, URINE     Status: None   Collection Time    07/27/13 12:57 AM      Result Value Ref Range   Preg Test, Ur NEGATIVE  NEGATIVE  WET PREP, GENITAL     Status: Abnormal   Collection Time    07/27/13  1:12 AM      Result Value Ref Range   Yeast Wet Prep HPF POC RARE (*) NONE SEEN    Trich, Wet Prep NONE SEEN  NONE SEEN   Clue Cells Wet Prep HPF POC FEW (*) NONE SEEN   WBC, Wet Prep HPF POC FEW (*) NONE SEEN       Carlisle BeersJohn L Rjay Revolorio, MD 07/27/13 0139

## 2013-07-27 NOTE — ED Notes (Signed)
Patient is alert and oriented x3.  She was given DC instructions and follow up visit instructions.  Patient gave verbal understanding. She was DC ambulatory under her own power to home.  V/S stable.  He was not showing any signs of distress on DC 

## 2013-07-28 LAB — GC/CHLAMYDIA PROBE AMP
CT Probe RNA: NEGATIVE
GC Probe RNA: NEGATIVE

## 2013-08-02 ENCOUNTER — Encounter: Payer: Self-pay | Admitting: Family Medicine

## 2013-08-02 ENCOUNTER — Ambulatory Visit (INDEPENDENT_AMBULATORY_CARE_PROVIDER_SITE_OTHER): Payer: Medicaid Other | Admitting: Family Medicine

## 2013-08-02 VITALS — BP 135/81 | HR 77 | Temp 98.6°F | Wt 233.0 lb

## 2013-08-02 DIAGNOSIS — E282 Polycystic ovarian syndrome: Secondary | ICD-10-CM

## 2013-08-02 DIAGNOSIS — Z309 Encounter for contraceptive management, unspecified: Secondary | ICD-10-CM

## 2013-08-02 LAB — POCT URINE PREGNANCY: Preg Test, Ur: NEGATIVE

## 2013-08-02 MED ORDER — NORGESTIMATE-ETH ESTRADIOL 0.25-35 MG-MCG PO TABS: 1.0000 | ORAL_TABLET | Freq: Every day | ORAL | Status: DC

## 2013-08-02 NOTE — Patient Instructions (Signed)
Great to meet you!  [facility]  Come back to see Dr. Althea CharonKaramalegos in about 4-6 weeks.   Safe Sex Safe sex is about reducing the risk of giving or getting a sexually transmitted disease (STD). STDs are spread through sexual contact involving the genitals, mouth, or rectum. Some STDs can be cured and others cannot. Safe sex can also prevent unintended pregnancies.  WHAT ARE SOME SAFE SEX PRACTICES?  Limit your sexual activity to only one partner who is having sex with only you.  Talk to your partner about his or her past partners, past STDs, and drug use.  Use a condom every time you have sexual intercourse. This includes vaginal, oral, and anal sexual activity. Both females and males should wear condoms during oral sex. Only use latex or polyurethane condoms and water-based lubricants. Using petroleum-based lubricants or oils to lubricate a condom will weaken the condom and increase the chance that it will break. The condom should be in place from the beginning to the end of sexual activity. Wearing a condom reduces, but does not completely eliminate, your risk of getting or giving an STD. STDs can be spread by contact with infected body fluids and skin.  Get vaccinated for hepatitis B and HPV.  Avoid alcohol and recreational drugs, which can affect your judgment. You may forget to use a condom or participate in high-risk sex.  For females, avoid douching after sexual intercourse. Douching can spread an infection farther into the reproductive tract.  Check your body for signs of sores, blisters, rashes, or unusual discharge. See your health care provider if you notice any of these signs.  Avoid sexual contact if you have symptoms of an infection or are being treated for an STD. If you or your partner has herpes, avoid sexual contact when blisters are present. Use condoms at all other times.  If you are at risk of being infected with HIV, it is recommended  that you take a prescription medicine daily to prevent HIV infection. This is called pre-exposure prophylaxis (PrEP). You are considered at risk if:  You are a man who has sex with other men (MSM).  You are a heterosexual man or woman who is sexually active with more than one partner.  You take drugs by injection.  You are sexually active with a partner who has HIV.  Talk with your health care provider about whether you are at high risk of being infected with HIV. If you choose to begin PrEP, you should first be tested for HIV. You should then be tested every 3 months for as long as you are taking PrEP.  See your health care provider for regular screenings, exams, and tests for other STDs. Before having sex with a new partner, each of you should be screened for STDs and should talk about the results with each other. WHAT ARE THE BENEFITS OF SAFE SEX?   There is less chance of getting or giving an STD.  You can prevent unwanted or unintended pregnancies.  By discussing safe sex concerns with your partner, you may increase feelings of intimacy, comfort, trust, and honesty between the two of you. Document Released: 02/05/2004 Document Revised: 05/14/2013 Document Reviewed: 06/21/2011 [facility] Patient Information 2015 [place], [place]. This information is not intended to replace advice given to you by your health care provider. Make sure you discuss any questions you have with your health care provider.

## 2013-08-02 NOTE — Progress Notes (Signed)
Patient ID: Diane Lloyd, female   DOB: 08/17/1990, 23 y.o.   MRN: [id number]  Kevin FentonSamuel [name], MD Phone: [phone]  Subjective:  Chief complaint-noted  Pt Here for contraception management.  Patient states that she has irregular periods not having one for the last 10 months. When she does bleed she bleeds for 3 to 14 days. She states that she has known PCOS he would like to start birth control pills.  She has one sexual partner in the last 4 months and 2 in the last 6 months. She does use condoms.  Patient previously was [facility] [employer], now [place] [employer] in the fall.  ROS-  no fever, chills, sweats Abdominal pain Vaginal discharge No chest pain No dyspnea  Past Medical History Patient Active Problem List   Diagnosis Date Noted  . Allergic rhinitis 05/17/2013  . Sore throat 04/23/2013  . Lupus   . Mid sternal chest pain 08/16/2012  . GERD (gastroesophageal reflux disease) 03/20/2012  . Back pain 02/08/2012  . Polycystic ovarian syndrome 12/26/2011  . High risk sexual behavior 08/13/2011  . Healthcare maintenance 03/08/2011  . Family history of cardiac disorder 02/17/2011  . Depression, major, single episode, mild 09/23/2010  . HTN, goal below 140/80 01/27/2010  . ANEMIA, IRON DEFICIENCY 06/27/2009  . OBESITY, NOS 03/10/2006  . ASTHMA, INTERMITTENT 03/10/2006  . ECZEMA, ATOPIC DERMATITIS 03/10/2006    Medications- reviewed and updated Current Outpatient Prescriptions  Medication Sig Dispense Refill  . naproxen sodium (ANAPROX) 220 MG tablet Take 440 mg by mouth 2 (two) times daily with a meal.      . norgestimate-ethinyl estradiol (SPRINTEC 28) 0.25-35 MG-MCG tablet Take 1 tablet by mouth daily.  1 Package  11   No current facility-administered medications for this visit.    Objective: BP 135/81  Pulse 77  Temp(Src) 98.6 F (37 C) (Oral)  Wt 233 lb (105.688 kg)  LMP 08/02/2013 Gen: NAD, alert, cooperative  with exam HEENT: NCAT CV: RRR, good S1/S2, no murmur Resp: CTABL, no wheezes, non-labored Ext: No edema, warm Neuro: Alert and oriented, No gross deficits   Assessment/Plan:  Polycystic ovarian syndrome With oligomenorrhea, Satble Will start OCPs for contraception and cycle regulation. Encouraged followup with PCP     Orders Placed This Encounter  Procedures  . POCT urine pregnancy    Meds ordered this encounter  Medications  . norgestimate-ethinyl estradiol (SPRINTEC 28) 0.25-35 MG-MCG tablet    Sig: Take 1 tablet by mouth daily.    Dispense:  1 Package    Refill:  11

## 2013-08-02 NOTE — Assessment & Plan Note (Signed)
With oligomenorrhea, Satble Will start OCPs for contraception and cycle regulation. Encouraged followup with PCP

## 2013-08-03 ENCOUNTER — Ambulatory Visit: Payer: Medicaid Other | Admitting: Family Medicine

## 2013-10-08 ENCOUNTER — Encounter: Payer: Self-pay | Admitting: Family Medicine

## 2013-10-16 ENCOUNTER — Encounter: Payer: Self-pay | Admitting: Family Medicine

## 2013-11-15 ENCOUNTER — Encounter (HOSPITAL_COMMUNITY): Payer: Self-pay | Admitting: Emergency Medicine

## 2013-11-15 ENCOUNTER — Emergency Department (HOSPITAL_COMMUNITY)
Admission: EM | Admit: 2013-11-15 | Discharge: 2013-11-15 | Discharge status: Against Medical Advice | Payer: BC Managed Care – PPO | Attending: Emergency Medicine | Admitting: Emergency Medicine

## 2013-11-15 DIAGNOSIS — R109 Unspecified abdominal pain: Secondary | ICD-10-CM | POA: Diagnosis present

## 2013-11-15 LAB — COMPREHENSIVE METABOLIC PANEL
ALT: 12 U/L (ref 0–35)
AST: 15 U/L (ref 0–37)
Albumin: 3.3 g/dL — ABNORMAL LOW (ref 3.5–5.2)
Alkaline Phosphatase: 48 U/L (ref 39–117)
Anion gap: 16 — ABNORMAL HIGH (ref 5–15)
BUN: 10 mg/dL (ref 6–23)
CO2: 20 mEq/L (ref 19–32)
Calcium: 9.4 mg/dL (ref 8.4–10.5)
Chloride: 103 mEq/L (ref 96–112)
Creatinine, Ser: 0.85 mg/dL (ref 0.50–1.10)
GFR calc Af Amer: >90 mL/min (ref >90)
GFR calc non Af Amer: >90 mL/min (ref >90)
Glucose, Bld: 134 mg/dL — ABNORMAL HIGH (ref 70–99)
Potassium: 4 mEq/L (ref 3.7–5.3)
Sodium: 139 mEq/L (ref 137–147)
Total Bilirubin: <0.2 mg/dL — ABNORMAL LOW (ref 0.3–1.2)
Total Protein: 7.2 g/dL (ref 6.0–8.3)

## 2013-11-15 LAB — CBC WITH DIFFERENTIAL/PLATELET
Basophils Absolute: 0 10*3/uL (ref 0.0–0.1)
Basophils Relative: 0 % (ref 0–1)
Eosinophils Absolute: 0 10*3/uL (ref 0.0–0.7)
Eosinophils Relative: 1 % (ref 0–5)
HCT: 33.3 % — ABNORMAL LOW (ref 36.0–46.0)
Hemoglobin: 10.7 g/dL — ABNORMAL LOW (ref 12.0–15.0)
Lymphocytes Relative: 33 % (ref 12–46)
Lymphs Abs: 2.1 10*3/uL (ref 0.7–4.0)
MCH: 26.3 pg (ref 26.0–34.0)
MCHC: 32.1 g/dL (ref 30.0–36.0)
MCV: 81.8 fL (ref 78.0–100.0)
Monocytes Absolute: 0.3 10*3/uL (ref 0.1–1.0)
Monocytes Relative: 5 % (ref 3–12)
Neutro Abs: 3.8 10*3/uL (ref 1.7–7.7)
Neutrophils Relative %: 61 % (ref 43–77)
Platelets: 295 10*3/uL (ref 150–400)
RBC: 4.07 MIL/uL (ref 3.87–5.11)
RDW: 14.7 % (ref 11.5–15.5)
WBC: 6.3 10*3/uL (ref 4.0–10.5)

## 2013-11-15 LAB — LIPASE, BLOOD: Lipase: 28 U/L (ref 11–59)

## 2013-11-15 MED ORDER — ONDANSETRON HCL 4 MG/2ML IJ SOLN: 4.0000 mg | Freq: Once | INTRAMUSCULAR | Status: DC

## 2013-11-15 NOTE — ED Notes (Signed)
The patient said she has been having abdominal pain for three days.  She denies any vomiting or diarrhea but does say she is nauseated.  The patient did advise me that she has been constipated but denies any urinary symptoms.

## 2013-11-26 ENCOUNTER — Ambulatory Visit: Payer: Self-pay | Admitting: Family Medicine

## 2013-11-28 ENCOUNTER — Encounter: Payer: Self-pay | Admitting: Family Medicine

## 2013-11-28 ENCOUNTER — Ambulatory Visit (INDEPENDENT_AMBULATORY_CARE_PROVIDER_SITE_OTHER): Payer: BC Managed Care – PPO | Admitting: Family Medicine

## 2013-11-28 VITALS — BP 149/87 | HR 94 | Temp 99.9°F | Ht 69.0 in | Wt 245.2 lb

## 2013-11-28 DIAGNOSIS — A084 Viral intestinal infection, unspecified: Secondary | ICD-10-CM

## 2013-11-28 NOTE — Assessment & Plan Note (Signed)
Reassuring exam, only thing not consistent with viral gastro is the 3 month history of abdominal pain (that patient has been mostly ignoring). Asked her to f/u if not improving, may need additional imaging for the abdominal pain. Otherwise symptomatic treatment, push fluids, can take loperamide for symptom relief during day but cautioned not to use too often.

## 2013-11-28 NOTE — Patient Instructions (Signed)
Abdominal Pain I think this is most likely a viral gastroenteritis, or an infection of the intestines and stomach caused by a virus.  Treatment - you should: continue drinking plenty of fluids (water!), green leafy vegetables for fiber, you can try loperamide (imodium) if the symptoms are interfering with your daily activities, however this may sometimes prolong your infection (if that is what is causing it) You should be better in: about 1 week Call us or go to the ER if you have severe pain or bleeding or high fever, develop dark or bloody stools, abdominal pain gets worse Come back to see us in about 1-2 weeks if not improving  Imodium dosing: take 2 mg up to 4 times a day.

## 2013-11-28 NOTE — Progress Notes (Signed)
   Subjective:    Patient ID: Diane Lloyd, female    DOB: 05/28/1990, 23 y.o.   MRN: [id number]  HPI  Patient presents for Same Day Appointment  CC: DIARRHEA  Having diarrhea for 4 days Progression: getting worse Stools per day: 4-5 Does diarrhea wake patient: yes Medications tried: none Recent travel: no Sick contacts: yes (cold/URI symptoms, no diarrhea) Ingested suspicious foods: no Antibiotics recently: no Immunocompromised: yes (lupus)  Symptoms Vomiting: no Abdominal pain: yes (present for at least 3 months, not any worse) Weight Loss: no Decreased urine output: no Lightheadedness: yes Fever: no Bloody stools: no  Smoking Status noted  Review of Systems   See HPI for ROS. All other systems reviewed and are negative.  Past medical history, surgical, family, and social history reviewed and updated in the EMR as appropriate.  Objective:  BP 149/87 mmHg  Pulse 94  Temp(Src) 99.9 F (37.7 C) (Oral)  Ht 5\' 9"  (1.753 m)  Wt 245 lb 3.2 oz (111.222 kg)  BMI 36.19 kg/m2  LMP 11/20/2013 Vitals reviewed  General: NAD CV: RRR, normal s1s2 no mrg Resp: CTAB Abdomen: obese, soft, mildly tender epigastrium with equivocal rebound, no guarding, bowel sounds hyperactive  Assessment & Plan:  See Problem List Documentation

## 2013-12-21 ENCOUNTER — Ambulatory Visit: Payer: BC Managed Care – PPO | Admitting: Family Medicine

## 2014-02-04 ENCOUNTER — Encounter (HOSPITAL_COMMUNITY): Payer: Self-pay | Admitting: Emergency Medicine

## 2014-02-04 ENCOUNTER — Emergency Department (HOSPITAL_COMMUNITY)
Admission: EM | Admit: 2014-02-04 | Discharge: 2014-02-04 | Disposition: A | Discharge status: Home / Self Care | Payer: BLUE CROSS/BLUE SHIELD | Attending: Emergency Medicine | Admitting: Emergency Medicine

## 2014-02-04 DIAGNOSIS — Z791 Long term (current) use of non-steroidal anti-inflammatories (NSAID): Secondary | ICD-10-CM | POA: Diagnosis not present

## 2014-02-04 DIAGNOSIS — Z79899 Other long term (current) drug therapy: Secondary | ICD-10-CM | POA: Insufficient documentation

## 2014-02-04 DIAGNOSIS — B3731 Acute candidiasis of vulva and vagina: Secondary | ICD-10-CM

## 2014-02-04 DIAGNOSIS — J45909 Unspecified asthma, uncomplicated: Secondary | ICD-10-CM | POA: Diagnosis not present

## 2014-02-04 DIAGNOSIS — E669 Obesity, unspecified: Secondary | ICD-10-CM | POA: Insufficient documentation

## 2014-02-04 DIAGNOSIS — Z3202 Encounter for pregnancy test, result negative: Secondary | ICD-10-CM | POA: Insufficient documentation

## 2014-02-04 DIAGNOSIS — Z862 Personal history of diseases of the blood and blood-forming organs and certain disorders involving the immune mechanism: Secondary | ICD-10-CM | POA: Insufficient documentation

## 2014-02-04 DIAGNOSIS — Z8739 Personal history of other diseases of the musculoskeletal system and connective tissue: Secondary | ICD-10-CM | POA: Insufficient documentation

## 2014-02-04 DIAGNOSIS — B373 Candidiasis of vulva and vagina: Secondary | ICD-10-CM | POA: Diagnosis not present

## 2014-02-04 DIAGNOSIS — R300 Dysuria: Secondary | ICD-10-CM | POA: Diagnosis present

## 2014-02-04 LAB — POC URINE PREG, ED: Preg Test, Ur: NEGATIVE

## 2014-02-04 LAB — URINALYSIS, ROUTINE W REFLEX MICROSCOPIC
Bilirubin Urine: NEGATIVE
Glucose, UA: NEGATIVE mg/dL
Hgb urine dipstick: NEGATIVE
Ketones, ur: NEGATIVE mg/dL
Leukocytes, UA: NEGATIVE
Nitrite: NEGATIVE
Protein, ur: NEGATIVE mg/dL
Specific Gravity, Urine: 1.022 (ref 1.005–1.030)
Urobilinogen, UA: 1 mg/dL (ref 0.0–1.0)
pH: 7.5 (ref 5.0–8.0)

## 2014-02-04 LAB — WET PREP, GENITAL: Trich, Wet Prep: NONE SEEN

## 2014-02-04 MED ORDER — FLUCONAZOLE 200 MG PO TABS: 200.0000 mg | ORAL_TABLET | Freq: Every day | ORAL | Status: AC

## 2014-02-04 MED ORDER — FLUCONAZOLE 200 MG PO TABS
200.0000 mg | ORAL_TABLET | Freq: Once | ORAL | Status: AC
  Administered 2014-02-04: 200 mg via ORAL
  Filled 2014-02-04: qty 1

## 2014-02-04 NOTE — ED Notes (Signed)
Pt c/o vaginal discharge x 5 days that was white so pt states that she thought was yeast infection so pt bought and started OTC meds for yeast infection yesterday.  Pt states that night she started having some yellow discharge. then some vaginal bleeding, dysuria and lower abd pain that started this morning.

## 2014-02-04 NOTE — Discharge Instructions (Signed)

## 2014-02-04 NOTE — ED Notes (Signed)
Pelvic cart set up and at bedside. 

## 2014-02-04 NOTE — ED Provider Notes (Signed)
CSN: [id number]     Arrival date & time 02/04/14  1157 History   First MD Initiated Contact with Patient 02/04/14 1209     Chief Complaint  Patient presents with  . Vaginal Bleeding  . Vaginal Discharge  . Dysuria     (Consider location/radiation/quality/duration/timing/severity/associated sxs/prior Treatment) HPI    PCP: Saralyn Pilar, DO Blood pressure 175/82, pulse 89, temperature 98.1 F (36.7 C), temperature source Oral, resp. rate 16, last menstrual period 01/15/2014, SpO2 100 %.  Diane Lloyd is a 24 y.o.female with a significant PMH of anemia, lupus. Asthma, PCOS  presents to the ER with complaints of vaginal discharge for the past 5 days with some suprapubic abdominal pain. She believed that she has years infection and self treated with intravaginal Monistat. She then today noticed yellow discharge mixed with it and  A small amount of blood. This concerned her so she came to the ED for evaluation. She reports being sexually active and does not always use protection- she is unsure of who he is sexually active with.  She has associated dysuria, lower abdominal pain. No fever, coughing, n/v/d.    Past Medical History  Diagnosis Date  . Anemia     iron def  . Obesity, unspecified   . Lupus     + ANA in 2012  . Asthma   . PCOS (polycystic ovarian syndrome)    Past Surgical History  Procedure Laterality Date  . Tonsillectomy     Family History  Problem Relation Age of Onset  . Hypertension Mother   . Lupus Mother   . Diabetes Mother   . Cancer Father 76    esophagus  . Asthma Father   . Cancer Maternal Uncle 40    pancreatic cancer  . Cancer Paternal Grandmother 23    esophageal   History  Substance Use Topics  . Smoking status: Never Smoker   . Smokeless tobacco: Never Used  . Alcohol Use: No   OB History    Gravida Para Term Preterm AB TAB SAB Ectopic Multiple Living   0              Review of Systems  10 Systems reviewed and are  negative for acute change except as noted in the HPI.    Allergies  Review of patient's allergies indicates no known allergies.  Home Medications   Prior to Admission medications   Medication Sig Start Date End Date Taking? Authorizing Provider  naproxen sodium (ANAPROX) 220 MG tablet Take 440 mg by mouth 2 (two) times daily as needed (pain).    Yes Historical Provider, MD  norgestimate-ethinyl estradiol (SPRINTEC 28) 0.25-35 MG-MCG tablet Take 1 tablet by mouth daily. 08/02/13  Yes Elenora Gamma, MD  fluconazole (DIFLUCAN) 200 MG tablet Take 1 tablet (200 mg total) by mouth daily. 02/04/14 02/11/14  Jonnae Fonseca Irine Seal, PA-C   BP 175/82 mmHg  Pulse 89  Temp(Src) 98.1 F (36.7 C) (Oral)  Resp 16  SpO2 100%  LMP 01/15/2014 Physical Exam  Constitutional: She appears well-developed and well-nourished. No distress.  HENT:  Head: Normocephalic and atraumatic.  Eyes: Pupils are equal, round, and reactive to light.  Neck: Normal range of motion. Neck supple.  Cardiovascular: Normal rate and regular rhythm.   Pulmonary/Chest: Effort normal.  Abdominal: Soft.  Genitourinary: Uterus normal. Cervix exhibits no motion tenderness, no discharge and no friability. Right adnexum displays no mass, no tenderness and no fullness. Left adnexum displays no mass, no tenderness and  no fullness. There is bleeding (small amoutn of bleeding in vaginal vault) in the vagina. No tenderness in the vagina. Vaginal discharge (thick white) found.  Neurological: She is alert.  Skin: Skin is warm and dry.  Nursing note and vitals reviewed.   ED Course  Procedures (including critical care time) Labs Review Labs Reviewed  WET PREP, GENITAL - Abnormal; Notable for the following:    Yeast Wet Prep HPF POC MANY (*)    Clue Cells Wet Prep HPF POC FEW (*)    WBC, Wet Prep HPF POC FEW (*)    All other components within normal limits  URINALYSIS, ROUTINE W REFLEX MICROSCOPIC - Abnormal; Notable for the following:     Color, Urine AMBER (*)    All other components within normal limits  RPR  HIV ANTIBODY (ROUTINE TESTING)  POC URINE PREG, ED  GC/CHLAMYDIA PROBE AMP ([place])    Imaging Review No results found.   EKG Interpretation None      MDM   Final diagnoses:  Vaginal yeast infection   No UTi on in and out cath. She has a negative urine preg Pelvic exam shows few WBC and clue cells but MANY YEAST.  Vaginal candida is consistent with patients presentation and exam Will rx Diflucan. GC, RPR, and HIV pending.  23 y.o.Diane Lloyd's evaluation in the Emergency Department is complete. It has been determined that no acute conditions requiring further emergency intervention are present at this time. The patient/guardian have been advised of the diagnosis and plan. We have discussed signs and symptoms that warrant return to the ED, such as changes or worsening in symptoms.  Vital signs are stable at discharge. Filed Vitals:   02/04/14 1205  BP: 175/82  Pulse: 89  Temp: 98.1 F (36.7 C)  Resp: 16    Patient/guardian has voiced understanding and agreed to follow-up with the PCP or specialist.     Dorthula Matasiffany G Celest Reitz, PA-C 02/04/14 1445  Suzi RootsKevin E Steinl, MD 02/05/14 [phone]

## 2014-02-05 LAB — GC/CHLAMYDIA PROBE AMP (~~LOC~~) NOT AT ARMC
Chlamydia: NEGATIVE
Neisseria Gonorrhea: NEGATIVE

## 2014-02-05 LAB — HIV ANTIBODY (ROUTINE TESTING W REFLEX)
HIV 1/O/2 Abs-Index Value: <1 (ref <1.00)
HIV-1/HIV-2 Ab: NONREACTIVE

## 2014-02-05 LAB — RPR: RPR Ser Ql: NONREACTIVE

## 2014-04-09 ENCOUNTER — Ambulatory Visit (INDEPENDENT_AMBULATORY_CARE_PROVIDER_SITE_OTHER): Payer: BLUE CROSS/BLUE SHIELD | Admitting: Family Medicine

## 2014-04-09 ENCOUNTER — Encounter: Payer: Self-pay | Admitting: Family Medicine

## 2014-04-09 VITALS — BP 147/93 | HR 76 | Temp 98.8°F | Ht 69.0 in | Wt 258.3 lb

## 2014-04-09 DIAGNOSIS — A084 Viral intestinal infection, unspecified: Secondary | ICD-10-CM | POA: Diagnosis not present

## 2014-04-09 NOTE — Progress Notes (Signed)
Patient ID: Diane Lloyd, female   DOB: 1990-07-20, 24 y.o.   MRN: [id number]   HPI  Patient presents today for nausea and diarrhea  Patient states that her symptoms began with diarrhea about 3 days ago. Nausea began one day ago. She denies any episodes of emesis. She has had 3-5 episodes of loose stools since the onset of her symptoms. Today her symptoms seem to be improving. She denies any fevers but has had intermittent chills throughout this time.  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  She denies blood in her stool She denies abdominal pain She denies chest pain Denies dyspnea  She is tolerating oral food and fluids well. She is urinating normally.  Smoking status noted ROS: Per HPI  Objective: BP 147/93 mmHg  Pulse 76  Temp(Src) 98.8 F (37.1 C) (Oral)  Ht 5\' 9"  (1.753 m)  Wt 258 lb 4.8 oz (117.164 kg)  BMI 38.13 kg/m2  LMP 03/13/2014 Gen: NAD, alert, cooperative with exam HEENT: NCAT CV: RRR, good S1/S2, no murmur Resp: CTABL, no wheezes, non-labored Abd: SNTND, BS present, no guarding or organomegaly Ext: No edema, warm Neuro: Alert and oriented, No gross deficits  Assessment and plan:  Viral gastroenteritis Viral gastro Abd exam reassuring, no red flags Discussed supportive care Out of work 1 more day, pt is CMA

## 2014-04-09 NOTE — Assessment & Plan Note (Signed)
Viral gastro Abd exam reassuring, no red flags Discussed supportive care Out of work 1 more day, pt is CMA

## 2014-04-09 NOTE — Patient Instructions (Signed)
Great to meet you!  This biggest thing for you right now is staying hydrated. Drink plenty of fluids and/or gatorade (add about 1/3 to 1/2 glass of water to gatorade during diarrhea to prevent more diarrhea)  Viral Gastroenteritis Viral gastroenteritis is also known as stomach flu. This condition affects the stomach and intestinal tract. It can cause sudden diarrhea and vomiting. The illness typically lasts 3 to 8 days. Most people develop an immune response that eventually gets rid of the virus. While this natural response develops, the virus can make you quite ill. CAUSES  Many different viruses can cause gastroenteritis, such as rotavirus or noroviruses. You can catch one of these viruses by consuming contaminated food or water. You may also catch a virus by sharing utensils or other personal items with an infected person or by touching a contaminated surface. SYMPTOMS  The most common symptoms are diarrhea and vomiting. These problems can cause a severe loss of body fluids (dehydration) and a body salt (electrolyte) imbalance. Other symptoms may include:  Fever.  Headache.  Fatigue.  Abdominal pain. DIAGNOSIS  Your caregiver can usually diagnose viral gastroenteritis based on your symptoms and a physical exam. A stool sample may also be taken to test for the presence of viruses or other infections. TREATMENT  This illness typically goes away on its own. Treatments are aimed at rehydration. The most serious cases of viral gastroenteritis involve vomiting so severely that you are not able to keep fluids down. In these cases, fluids must be given through an intravenous line (IV). HOME CARE INSTRUCTIONS   Drink enough fluids to keep your urine clear or pale yellow. Drink small amounts of fluids frequently and increase the amounts as tolerated.  Ask your caregiver for specific rehydration instructions.  Avoid:  Foods high in sugar.  Alcohol.  Carbonated  drinks.  Tobacco.  Juice.  Caffeine drinks.  Extremely hot or cold fluids.  Fatty, greasy foods.  Too much intake of anything at one time.  Dairy products until 24 to 48 hours after diarrhea stops.  You may consume probiotics. Probiotics are active cultures of beneficial bacteria. They may lessen the amount and number of diarrheal stools in adults. Probiotics can be found in yogurt with active cultures and in supplements.  Wash your hands well to avoid spreading the virus.  Only take over-the-counter or prescription medicines for pain, discomfort, or fever as directed by your caregiver. Do not give aspirin to children. Antidiarrheal medicines are not recommended.  Ask your caregiver if you should continue to take your regular prescribed and over-the-counter medicines.  Keep all follow-up appointments as directed by your caregiver. SEEK IMMEDIATE MEDICAL CARE IF:   You are unable to keep fluids down.  You do not urinate at least once every 6 to 8 hours.  You develop shortness of breath.  You notice blood in your stool or vomit. This may look like coffee grounds.  You have abdominal pain that increases or is concentrated in one small area (localized).  You have persistent vomiting or diarrhea.  You have a fever.  The patient is a child younger than 3 months, and he or she has a fever.  The patient is a child older than 3 months, and he or she has a fever and persistent symptoms.  The patient is a child older than 3 months, and he or she has a fever and symptoms suddenly get worse.  The patient is a baby, and he or she has no tears when  crying. MAKE SURE YOU:   Understand these instructions.  Will watch your condition.  Will get help right away if you are not doing well or get worse. Document Released: 12/28/2004 Document Revised: 03/22/2011 Document Reviewed: 10/14/2010 [facility] Patient Information 2015 [place], [place]. This information is not intended to replace  advice given to you by your health care provider. Make sure you discuss any questions you have with your health care provider.

## 2014-06-19 ENCOUNTER — Emergency Department (HOSPITAL_COMMUNITY): Payer: BLUE CROSS/BLUE SHIELD

## 2014-06-19 ENCOUNTER — Emergency Department (HOSPITAL_COMMUNITY)
Admission: EM | Admit: 2014-06-19 | Discharge: 2014-06-19 | Disposition: A | Discharge status: Home / Self Care | Payer: BLUE CROSS/BLUE SHIELD | Attending: Emergency Medicine | Admitting: Emergency Medicine

## 2014-06-19 ENCOUNTER — Encounter (HOSPITAL_COMMUNITY): Payer: Self-pay | Admitting: Emergency Medicine

## 2014-06-19 DIAGNOSIS — N939 Abnormal uterine and vaginal bleeding, unspecified: Secondary | ICD-10-CM | POA: Insufficient documentation

## 2014-06-19 DIAGNOSIS — N898 Other specified noninflammatory disorders of vagina: Secondary | ICD-10-CM | POA: Diagnosis present

## 2014-06-19 DIAGNOSIS — Z8742 Personal history of other diseases of the female genital tract: Secondary | ICD-10-CM | POA: Diagnosis not present

## 2014-06-19 DIAGNOSIS — E669 Obesity, unspecified: Secondary | ICD-10-CM | POA: Diagnosis not present

## 2014-06-19 DIAGNOSIS — J45909 Unspecified asthma, uncomplicated: Secondary | ICD-10-CM | POA: Diagnosis not present

## 2014-06-19 DIAGNOSIS — Z79899 Other long term (current) drug therapy: Secondary | ICD-10-CM | POA: Insufficient documentation

## 2014-06-19 DIAGNOSIS — Z3202 Encounter for pregnancy test, result negative: Secondary | ICD-10-CM | POA: Diagnosis not present

## 2014-06-19 DIAGNOSIS — R1084 Generalized abdominal pain: Secondary | ICD-10-CM | POA: Diagnosis not present

## 2014-06-19 DIAGNOSIS — Z862 Personal history of diseases of the blood and blood-forming organs and certain disorders involving the immune mechanism: Secondary | ICD-10-CM | POA: Insufficient documentation

## 2014-06-19 LAB — CBC WITH DIFFERENTIAL/PLATELET
Basophils Absolute: 0 10*3/uL (ref 0.0–0.1)
Basophils Relative: 0 % (ref 0–1)
Eosinophils Absolute: 0 10*3/uL (ref 0.0–0.7)
Eosinophils Relative: 0 % (ref 0–5)
HCT: 35.4 % — ABNORMAL LOW (ref 36.0–46.0)
Hemoglobin: 11 g/dL — ABNORMAL LOW (ref 12.0–15.0)
Lymphocytes Relative: 24 % (ref 12–46)
Lymphs Abs: 1.5 10*3/uL (ref 0.7–4.0)
MCH: 25.9 pg — ABNORMAL LOW (ref 26.0–34.0)
MCHC: 31.1 g/dL (ref 30.0–36.0)
MCV: 83.3 fL (ref 78.0–100.0)
Monocytes Absolute: 0.3 10*3/uL (ref 0.1–1.0)
Monocytes Relative: 5 % (ref 3–12)
Neutro Abs: 4.4 10*3/uL (ref 1.7–7.7)
Neutrophils Relative %: 71 % (ref 43–77)
Platelets: 273 10*3/uL (ref 150–400)
RBC: 4.25 MIL/uL (ref 3.87–5.11)
RDW: 14.8 % (ref 11.5–15.5)
WBC: 6.2 10*3/uL (ref 4.0–10.5)

## 2014-06-19 LAB — URINALYSIS, ROUTINE W REFLEX MICROSCOPIC
Bilirubin Urine: NEGATIVE
Glucose, UA: NEGATIVE mg/dL
Ketones, ur: NEGATIVE mg/dL
Leukocytes, UA: NEGATIVE
Nitrite: NEGATIVE
Protein, ur: NEGATIVE mg/dL
Specific Gravity, Urine: 1.026 (ref 1.005–1.030)
Urobilinogen, UA: 1 mg/dL (ref 0.0–1.0)
pH: 7 (ref 5.0–8.0)

## 2014-06-19 LAB — COMPREHENSIVE METABOLIC PANEL
ALT: 18 U/L (ref 14–54)
AST: 17 U/L (ref 15–41)
Albumin: 3.6 g/dL (ref 3.5–5.0)
Alkaline Phosphatase: 45 U/L (ref 38–126)
Anion gap: 9 (ref 5–15)
BUN: 7 mg/dL (ref 6–20)
CO2: 23 mmol/L (ref 22–32)
Calcium: 9 mg/dL (ref 8.9–10.3)
Chloride: 107 mmol/L (ref 101–111)
Creatinine, Ser: 0.83 mg/dL (ref 0.44–1.00)
GFR calc Af Amer: >60 mL/min (ref >60)
GFR calc non Af Amer: >60 mL/min (ref >60)
Glucose, Bld: 151 mg/dL — ABNORMAL HIGH (ref 65–99)
Potassium: 3.9 mmol/L (ref 3.5–5.1)
Sodium: 139 mmol/L (ref 135–145)
Total Bilirubin: 0.4 mg/dL (ref 0.3–1.2)
Total Protein: 7.3 g/dL (ref 6.5–8.1)

## 2014-06-19 LAB — URINE MICROSCOPIC-ADD ON

## 2014-06-19 LAB — WET PREP, GENITAL
Clue Cells Wet Prep HPF POC: NONE SEEN
Trich, Wet Prep: NONE SEEN
Yeast Wet Prep HPF POC: NONE SEEN

## 2014-06-19 LAB — POC URINE PREG, ED: Preg Test, Ur: NEGATIVE

## 2014-06-19 MED ORDER — ONDANSETRON 4 MG PO TBDP
4.0000 mg | ORAL_TABLET | Freq: Once | ORAL | Status: AC
  Administered 2014-06-19: 4 mg via ORAL
  Filled 2014-06-19: qty 1

## 2014-06-19 MED ORDER — ONDANSETRON HCL 4 MG PO TABS: 4.0000 mg | ORAL_TABLET | Freq: Four times a day (QID) | ORAL | Status: DC

## 2014-06-19 MED ORDER — HYDROCODONE-ACETAMINOPHEN 5-325 MG PO TABS: 1.0000 | ORAL_TABLET | ORAL | Status: DC | PRN

## 2014-06-19 MED ORDER — HYDROCODONE-ACETAMINOPHEN 5-325 MG PO TABS
1.0000 | ORAL_TABLET | Freq: Once | ORAL | Status: AC
  Administered 2014-06-19: 1 via ORAL
  Filled 2014-06-19: qty 1

## 2014-06-19 NOTE — ED Provider Notes (Signed)
CSN: [id number]     Arrival date & time 06/19/14  1446 History   First MD Initiated Contact with Patient 06/19/14 1653     Chief Complaint  Patient presents with  . Abdominal Pain  . Vaginal Discharge     (Consider location/radiation/quality/duration/timing/severity/associated sxs/prior Treatment) HPI    PCP: Saralyn Pilar, DO Blood pressure 142/66, pulse 79, temperature 98.1 F (36.7 C), temperature source Oral, resp. rate 18, last menstrual period 06/03/2014, SpO2 100 %.  Diane Lloyd is a 24 y.o.female with a significant PMH of anemia, obesity, lupus, asthma, PCOS presents to the ER with complaints of dark brown vaginal discharge and epigastric abdominal pains. The pain happened last year as well but she doesn't remember if she was seen. She now has been having pain for the last 5 days. It is crampy in quality. Does not feel like her normal cramps -- she has been on [facility] for the past year and a half. Denies lower abdominal pains, rlq or llq pain. No vaginal odor. She denies back pain, fever, dysuria, CP, SOB, weakness. She otherwise has felt fine. She presents to the ER today because the pain returned again this morning.   Past Medical History  Diagnosis Date  . Anemia     iron def  . Obesity, unspecified   . Lupus     + ANA in 2012  . Asthma   . PCOS (polycystic ovarian syndrome)    Past Surgical History  Procedure Laterality Date  . Tonsillectomy     Family History  Problem Relation Age of Onset  . Hypertension Mother   . Lupus Mother   . Diabetes Mother   . Cancer Father 38    esophagus  . Asthma Father   . Cancer Maternal Uncle 40    pancreatic cancer  . Cancer Paternal Grandmother 73    esophageal   History  Substance Use Topics  . Smoking status: Never Smoker   . Smokeless tobacco: Never Used  . Alcohol Use: No   OB History    Gravida Para Term Preterm AB TAB SAB Ectopic Multiple Living   0              Review of Systems  10 Systems  reviewed and are negative for acute change except as noted in the HPI.   Allergies  Review of patient's allergies indicates no known allergies.  Home Medications   Prior to Admission medications   Medication Sig Start Date End Date Taking? Authorizing Provider  naproxen sodium (ANAPROX) 220 MG tablet Take 440 mg by mouth 2 (two) times daily as needed (pain).    Yes Historical Provider, MD  norgestimate-ethinyl estradiol (SPRINTEC 28) 0.25-35 MG-MCG tablet Take 1 tablet by mouth daily. 08/02/13  Yes Elenora Gamma, MD  HYDROcodone-acetaminophen (NORCO/VICODIN) 5-325 MG per tablet Take 1-2 tablets by mouth every 4 (four) hours as needed. 06/19/14   Jailee Jaquez Neva Seat, PA-C  ondansetron (ZOFRAN) 4 MG tablet Take 1 tablet (4 mg total) by mouth every 6 (six) hours. 06/19/14   Corine Solorio Neva Seat, PA-C   BP 142/66 mmHg  Pulse 79  Temp(Src) 98.1 F (36.7 C) (Oral)  Resp 18  SpO2 100%  LMP 06/03/2014 Physical Exam  Constitutional: She appears well-developed and well-nourished. No distress.  HENT:  Head: Normocephalic and atraumatic.  Eyes: Pupils are equal, round, and reactive to light.  Neck: Normal range of motion. Neck supple.  Cardiovascular: Normal rate and regular rhythm.   Pulmonary/Chest: Effort normal.  Abdominal:  Soft. Bowel sounds are normal. She exhibits no distension. There is no hepatomegaly. There is no tenderness. There is no rigidity, no rebound and no guarding.  Genitourinary: Uterus normal. Cervix exhibits no motion tenderness and no discharge. Right adnexum displays no mass and no tenderness. Left adnexum displays no mass and no tenderness. There is bleeding (small amount of blood in vaginal vault) in the vagina.  Neurological: She is alert.  Skin: Skin is warm and dry.  Nursing note and vitals reviewed.   ED Course  Procedures (including critical care time) Labs Review Labs Reviewed  WET PREP, GENITAL - Abnormal; Notable for the following:    WBC, Wet Prep HPF POC FEW (*)     All other components within normal limits  CBC WITH DIFFERENTIAL/PLATELET - Abnormal; Notable for the following:    Hemoglobin 11.0 (*)    HCT 35.4 (*)    MCH 25.9 (*)    All other components within normal limits  COMPREHENSIVE METABOLIC PANEL - Abnormal; Notable for the following:    Glucose, Bld 151 (*)    All other components within normal limits  URINALYSIS, ROUTINE W REFLEX MICROSCOPIC (NOT [facility]) - Abnormal; Notable for the following:    APPearance CLOUDY (*)    Hgb urine dipstick MODERATE (*)    All other components within normal limits  URINE MICROSCOPIC-ADD ON - Abnormal; Notable for the following:    Squamous Epithelial / LPF MANY (*)    Bacteria, UA FEW (*)    All other components within normal limits  POC URINE PREG, ED  GC/CHLAMYDIA PROBE AMP ([place]) NOT AT [facility]    Imaging Review [place] Abdomen Limited  06/19/2014   CLINICAL DATA:  24 year old female with right upper quadrant abdominal pain for the past 5 days. No associated nausea, vomiting or diarrhea.  EXAM: US ABDOMEN LIMITED - RIGHT UPPER QUADRANT  COMPARISON:  No priors.  FINDINGS: Gallbladder:  No gallstones or wall thickening visualized. No sonographic Murphy sign noted.  Common bile duct:  Diameter: 3.2 mm  Liver:  No focal lesion identified. Within normal limits in parenchymal echogenicity.  IMPRESSION: Non  1. Normal right upper quadrant ultrasound. Specifically, no evidence of cholelithiasis or findings to suggest acute cholecystitis at this time.   Electronically Signed   By: Trudie Reedaniel  Entrikin M.D.   On: 06/19/2014 19:23     EKG Interpretation None      MDM   Final diagnoses:  Generalized abdominal pain    Pelvic exam showed small amount of blood in vaginal vault, urinalysis shows some hemoglobin,  CBC and CMP are unremarkable.  The patient did not have pain during her stay and did not request pain medication until just before discharge. [facility] but has PCOS and sometimes  gets abnormal menstrual cycles.   No diarrhea, vomiting, flank pain, vaginal infection, CP, SOB or any other systemic symptoms. The patient looks well.  Medications  HYDROcodone-acetaminophen (NORCO/VICODIN) 5-325 MG per tablet 1 tablet (1 tablet Oral Given 06/19/14 1944)  ondansetron (ZOFRAN-ODT) disintegrating tablet 4 mg (4 mg Oral Given 06/19/14 1944)    24 y.o.Adreena D Radwan's evaluation in the Emergency Department is complete. It has been determined that no acute conditions requiring further emergency intervention are present at this time. The patient/guardian have been advised of the diagnosis and plan. We have discussed signs and symptoms that warrant return to the ED, such as changes or worsening in symptoms.  Vital signs are stable  at discharge. Filed Vitals:   06/19/14 1931  BP: 142/66  Pulse: 79  Temp: 98.1 F (36.7 C)  Resp: 18    Patient/guardian has voiced understanding and agreed to follow-up with the PCP or specialist.    Marlon Pel, PA-C 06/19/14 2017  Lorre Nick, MD 06/24/14 [phone]

## 2014-06-19 NOTE — Discharge Instructions (Signed)
Abdominal Pain, Women °Abdominal (stomach, pelvic, or belly) pain can be caused by many things. It is important to tell your doctor: °· The location of the pain. °· Does it come and go or is it present all the time? °· Are there things that start the pain (eating certain foods, exercise)? °· Are there other symptoms associated with the pain (fever, nausea, vomiting, diarrhea)? °All of this is helpful to know when trying to find the cause of the pain. °CAUSES  °· Stomach: virus or bacteria infection, or ulcer. °· Intestine: appendicitis (inflamed appendix), regional ileitis (Crohn's disease), ulcerative colitis (inflamed colon), irritable bowel syndrome, diverticulitis (inflamed diverticulum of the colon), or cancer of the stomach or intestine. °· Gallbladder disease or stones in the gallbladder. °· Kidney disease, kidney stones, or infection. °· Pancreas infection or cancer. °· Fibromyalgia (pain disorder). °· Diseases of the female organs: °¨ Uterus: fibroid (non-cancerous) tumors or infection. °¨ Fallopian tubes: infection or tubal pregnancy. °¨ Ovary: cysts or tumors. °¨ Pelvic adhesions (scar tissue). °¨ Endometriosis (uterus lining tissue growing in the pelvis and on the pelvic organs). °¨ Pelvic congestion syndrome (female organs filling up with blood just before the menstrual period). °¨ Pain with the menstrual period. °¨ Pain with ovulation (producing an egg). °¨ Pain with an IUD (intrauterine device, birth control) in the uterus. °¨ Cancer of the female organs. °· Functional pain (pain not caused by a disease, may improve without treatment). °· Psychological pain. °· Depression. °DIAGNOSIS  °Your doctor will decide the seriousness of your pain by doing an examination. °· Blood tests. °· X-rays. °· Ultrasound. °· CT scan (computed tomography, special type of X-ray). °· MRI (magnetic resonance imaging). °· Cultures, for infection. °· Barium enema (dye inserted in the large intestine, to better view it with  X-rays). °· Colonoscopy (looking in intestine with a lighted tube). °· Laparoscopy (minor surgery, looking in abdomen with a lighted tube). °· Major abdominal exploratory surgery (looking in abdomen with a large incision). °TREATMENT  °The treatment will depend on the cause of the pain.  °· Many cases can be observed and treated at home. °· Over-the-counter medicines recommended by your caregiver. °· Prescription medicine. °· Antibiotics, for infection. °· Birth control pills, for painful periods or for ovulation pain. °· Hormone treatment, for endometriosis. °· Nerve blocking injections. °· Physical therapy. °· Antidepressants. °· Counseling with a psychologist or psychiatrist. °· Minor or major surgery. °HOME CARE INSTRUCTIONS  °· Do not take laxatives, unless directed by your caregiver. °· Take over-the-counter pain medicine only if ordered by your caregiver. Do not take aspirin because it can cause an upset stomach or bleeding. °· Try a clear liquid diet (broth or water) as ordered by your caregiver. Slowly move to a bland diet, as tolerated, if the pain is related to the stomach or intestine. °· Have a thermometer and take your temperature several times a day, and record it. °· Bed rest and sleep, if it helps the pain. °· Avoid sexual intercourse, if it causes pain. °· Avoid stressful situations. °· Keep your follow-up appointments and tests, as your caregiver orders. °· If the pain does not go away with medicine or surgery, you may try: °¨ Acupuncture. °¨ Relaxation exercises (yoga, meditation). °¨ Group therapy. °¨ Counseling. °SEEK MEDICAL CARE IF:  °· You notice certain foods cause stomach pain. °· Your home care treatment is not helping your pain. °· You need stronger pain medicine. °· You want your IUD removed. °· You feel faint or   lightheaded. °· You develop nausea and vomiting. °· You develop a rash. °· You are having side effects or an allergy to your medicine. °SEEK IMMEDIATE MEDICAL CARE IF:  °· Your  pain does not go away or gets worse. °· You have a fever. °· Your pain is felt only in portions of the abdomen. The right side could possibly be appendicitis. The left lower portion of the abdomen could be colitis or diverticulitis. °· You are passing blood in your stools (bright red or black tarry stools, with or without vomiting). °· You have blood in your urine. °· You develop chills, with or without a fever. °· You pass out. °MAKE SURE YOU:  °· Understand these instructions. °· Will watch your condition. °· Will get help right away if you are not doing well or get worse. °Document Released: 10/25/2006 Document Revised: 05/14/2013 Document Reviewed: 11/14/2008 °ExitCare® Patient Information ©2015 [employer]. This information is not intended to replace advice given to you by your health care provider. Make sure you discuss any questions you have with your health care provider. ° °

## 2014-06-19 NOTE — ED Notes (Signed)
Pt c/o abd pain and vaginal discharge that is a brownish color. Pt denies n/v/d or odor with vaginal discharge.

## 2014-06-20 LAB — GC/CHLAMYDIA PROBE AMP (~~LOC~~) NOT AT ARMC
Chlamydia: NEGATIVE
Neisseria Gonorrhea: NEGATIVE

## 2014-07-05 ENCOUNTER — Other Ambulatory Visit: Payer: Self-pay | Admitting: Family Medicine

## 2014-07-09 NOTE — Telephone Encounter (Signed)
Pt has been out of birthcontrol pills since 07/06/14.  Clovis PuMartin, Tamika L, RN

## 2014-07-10 ENCOUNTER — Ambulatory Visit (INDEPENDENT_AMBULATORY_CARE_PROVIDER_SITE_OTHER): Payer: BLUE CROSS/BLUE SHIELD | Admitting: Family Medicine

## 2014-07-10 ENCOUNTER — Encounter: Payer: Self-pay | Admitting: Family Medicine

## 2014-07-10 VITALS — BP 128/70 | HR 78 | Temp 98.2°F | Wt 251.0 lb

## 2014-07-10 DIAGNOSIS — A084 Viral intestinal infection, unspecified: Secondary | ICD-10-CM

## 2014-07-10 NOTE — Progress Notes (Signed)
Patient ID: Lanier ClamDeneshia D Salomon, female   DOB: 1990-04-22, 22 y.o.   MRN: [id number]  Marikay AlarEric Hanif Radin, MD Phone: [phone]  Pietro CassisDeneshia D Sherrine MaplesGlenn is a 24 y.o. female who presents today for same day appointment.  Diarrhea: started 4 days ago. Has had 2-3 episodes a day up until yesterday when this has decreased. No blood in stool. No nausea or vomiting. Notes mild cramping abdominal discomfort in the 5-10 minutes leading up to the diarrhea, that resolves after having BM. Denies dysuria, vaginal d/c, and fevers. No abdominal pain at this time. No sick contacts, though works at a [employer]. No new foods. One episode of diarrhea today.   PMH: nonsmoker.   ROS: Per HPI   Physical Exam Filed Vitals:   07/10/14 1131  BP: 128/70  Pulse:   Temp:   Pulse 78  Gen: Well NAD Lungs: CTABL Nl WOB Heart: RRR, nom murmur appreciated Abd: soft, NT, ND Exts: Non edematous BL  LE, warm and well perfused.    Assessment/Plan: Please see individual problem list.  Viral gastroenteritis Viral gastro. Improving. Abdominal exam reassuring today. Afebrile. VSS. Discussed supportive care. Return precautions given.    Marikay AlarEric Meagen Limones, MD [facility] PGY[facility]

## 2014-07-10 NOTE — Assessment & Plan Note (Signed)
Viral gastro. Improving. Abdominal exam reassuring today. Afebrile. VSS. Discussed supportive care. Return precautions given.

## 2014-07-10 NOTE — Patient Instructions (Signed)
Nice to meet you. You likely have a viral illness leading to your diarrhea. Please make sure you drink plenty of fluids.  If you develop abdominal pain, vomiting, nausea, fever, vaginal discharge, burning with urination, or blood in stool please seek medical attention.

## 2014-07-12 ENCOUNTER — Encounter: Payer: Self-pay | Admitting: *Deleted

## 2014-08-07 ENCOUNTER — Encounter: Payer: Self-pay | Admitting: Family Medicine

## 2014-08-07 ENCOUNTER — Ambulatory Visit (INDEPENDENT_AMBULATORY_CARE_PROVIDER_SITE_OTHER): Payer: Self-pay | Admitting: Family Medicine

## 2014-08-07 VITALS — BP 143/71 | HR 71 | Temp 98.2°F | Wt 245.0 lb

## 2014-08-07 DIAGNOSIS — J309 Allergic rhinitis, unspecified: Secondary | ICD-10-CM

## 2014-08-07 MED ORDER — FLUTICASONE PROPIONATE 50 MCG/ACT NA SUSP: 2.0000 | Freq: Two times a day (BID) | NASAL | Status: DC

## 2014-08-07 MED ORDER — CETIRIZINE HCL 10 MG PO TABS: 10.0000 mg | ORAL_TABLET | Freq: Every day | ORAL | Status: DC

## 2014-08-07 NOTE — Assessment & Plan Note (Signed)
Start Zyrtec daily and Flonase twice a day Return precautions given

## 2014-08-07 NOTE — Patient Instructions (Signed)
Nice to meet you today. I think your symptoms are related to seasonal allergies. You can start taking Zyrtec once daily and using Flonase twice daily.Take these consistently for the next month, but you may be able to come off of them in the future if your allergy symptoms get better.  Come back to see [place] if you have fevers, or coughing up yellow mucus, or have shortness of breath.  Take care, Dr. B  Allergic Rhinitis Allergic rhinitis is when the mucous membranes in the nose respond to allergens. Allergens are particles in the air that cause your body to have an allergic reaction. This causes you to release allergic antibodies. Through a chain of events, these eventually cause you to release histamine into the blood stream. Although meant to protect the body, it is this release of histamine that causes your discomfort, such as frequent sneezing, congestion, and an itchy, runny nose.  CAUSES  Seasonal allergic rhinitis (hay fever) is caused by pollen allergens that may come from grasses, trees, and weeds. Year-round allergic rhinitis (perennial allergic rhinitis) is caused by allergens such as house dust mites, pet dander, and mold spores.  SYMPTOMS   Nasal stuffiness (congestion).  Itchy, runny nose with sneezing and tearing of the eyes. DIAGNOSIS  Your health care provider can help you determine the allergen or allergens that trigger your symptoms. If you and your health care provider are unable to determine the allergen, skin or blood testing may be used. TREATMENT  Allergic rhinitis does not have a cure, but it can be controlled by:  Medicines and allergy shots (immunotherapy).  Avoiding the allergen. Hay fever may often be treated with antihistamines in pill or nasal spray forms. Antihistamines block the effects of histamine. There are over-the-counter medicines that may help with nasal congestion and swelling around the eyes. Check with your health care provider before taking or giving  this medicine.  If avoiding the allergen or the medicine prescribed do not work, there are many new medicines your health care provider can prescribe. Stronger medicine may be used if initial measures are ineffective. Desensitizing injections can be used if medicine and avoidance does not work. Desensitization is when a patient is given ongoing shots until the body becomes less sensitive to the allergen. Make sure you follow up with your health care provider if problems continue. HOME CARE INSTRUCTIONS It is not possible to completely avoid allergens, but you can reduce your symptoms by taking steps to limit your exposure to them. It helps to know exactly what you are allergic to so that you can avoid your specific triggers. SEEK MEDICAL CARE IF:   You have a fever.  You develop a cough that does not stop easily (persistent).  You have shortness of breath.  You start wheezing.  Symptoms interfere with normal daily activities. Document Released: 09/22/2000 Document Revised: 01/02/2013 Document Reviewed: 09/04/2012 [facility] Patient Information 2015 [place], [place]. This information is not intended to replace advice given to you by your health care provider. Make sure you discuss any questions you have with your health care provider.

## 2014-08-07 NOTE — Progress Notes (Signed)
   Subjective:   Diane Lloyd is a 24 y.o. female with a history of intermittent asthma and allergic rhinitis here for cough  Has been sick for 2-3 wks. Nasal discharge: no Dry cough and feels likes has to clear throat Medications tried: none Sick contacts: friend has pneumonia and asthma - concerned about pneumonia  Symptoms Fever: no Headache or face pain: yes headaches - 1-2 times weekly Tooth pain: no Sneezing: yes Scratchy throat: no Allergies: no h/o allergies per report Shortness of breath: no Rash: no Sore throat or swollen glands: no   ROS see HPI Smoking Status noted   Objective:  BP 143/71 mmHg  Pulse 71  Temp(Src) 98.2 F (36.8 C) (Oral)  Wt 245 lb (111.131 kg)  SpO2 99%  LMP 07/04/2014 (Approximate)  Gen:  24 y.o. female in NAD HEENT: NCAT, MMM, EOMI, PERRL, anicteric sclerae, OP clear, TMs clear b/l, nasal turbinates inflamed CV: RRR, no MRG Resp: Non-labored, CTAB, no wheezes noted Ext: WWP, no edema Neuro: Alert and oriented, speech normal  Assessment:     Diane Lloyd is a 23 y.o. female here for dry cough, throat irritation, and sneezing, c/w allergic rhinitis.    Plan:     See problem list for problem-specific plans.   Erasmo Downer, MD PGY-2,  [facility] 08/07/2014  4:10 PM

## 2014-08-15 ENCOUNTER — Encounter: Payer: BLUE CROSS/BLUE SHIELD | Admitting: Obstetrics and Gynecology

## 2014-08-15 ENCOUNTER — Telehealth: Payer: Self-pay | Admitting: Obstetrics and Gynecology

## 2014-09-02 ENCOUNTER — Ambulatory Visit: Payer: BLUE CROSS/BLUE SHIELD | Admitting: Family Medicine

## 2014-09-10 ENCOUNTER — Encounter: Payer: BLUE CROSS/BLUE SHIELD | Admitting: Obstetrics and Gynecology

## 2014-09-11 ENCOUNTER — Encounter: Payer: Self-pay | Admitting: Family Medicine

## 2014-09-11 ENCOUNTER — Ambulatory Visit (INDEPENDENT_AMBULATORY_CARE_PROVIDER_SITE_OTHER): Payer: Self-pay | Admitting: Family Medicine

## 2014-09-11 VITALS — BP 125/72 | HR 80 | Temp 98.7°F | Ht 69.0 in | Wt 244.5 lb

## 2014-09-11 DIAGNOSIS — I10 Essential (primary) hypertension: Secondary | ICD-10-CM

## 2014-09-11 DIAGNOSIS — I160 Hypertensive urgency: Secondary | ICD-10-CM

## 2014-09-11 DIAGNOSIS — R030 Elevated blood-pressure reading, without diagnosis of hypertension: Secondary | ICD-10-CM

## 2014-09-11 DIAGNOSIS — IMO0001 Reserved for inherently not codable concepts without codable children: Secondary | ICD-10-CM | POA: Insufficient documentation

## 2014-09-11 LAB — TSH: TSH: 1.293 u[IU]/mL (ref 0.350–4.500)

## 2014-09-11 MED ORDER — AMLODIPINE BESYLATE 5 MG PO TABS: 5.0000 mg | ORAL_TABLET | Freq: Every day | ORAL | Status: DC

## 2014-09-11 NOTE — Progress Notes (Signed)
   Subjective:    Patient ID: Diane Lloyd, female    DOB: July 23, 1990, 24 y.o.   MRN: [id number]  Seen for Same day visit for   CC: elevated bp   [facility] at [facility] and 186/111  No hx of HTN, Dm2, hypothyroidism.  Reports no problems currently.  Mother has HTN, DM2.  Not drinking working, eating a lot of fast food.  Under more stress.    Review of Systems   See HPI for ROS. Objective:  BP 125/72 mmHg  Pulse 80  Temp(Src) 98.7 F (37.1 C) (Oral)  Ht  (1.753 m)  Wt 244 lb 8 oz (110.904 kg)  BMI 36.09 kg/m2  LMP 08/28/2014 (Approximate)  General: NAD HEENT: clear conjunctiva, thyroid normal, supple neck  Cardiac: RRR, normal heart sounds, no murmurs.  Respiratory: CTAB, normal effort Neuro: alert and oriented, no focal deficits  Manual re-check with thigh cuff: 125/72    Assessment & Plan:   Elevated BP Concerns with elevated blood pressure most likely consistent with an accurate blood pressure measurements As a manual measurement was taken today with a thigh cuff her blood pressure reading was within normal limits -Started 5 mg of amlodipine as patient was anxious about her elevated blood pressure - Advised that she could do a nurse visit and follow-up to  have her blood pressure taken here - encouraged DASH diet and exercise.  - TSH was taken - She has appointment with her pre-PCP on Friday (6th) and will determine at that time if need for antihypertensive

## 2014-09-11 NOTE — Assessment & Plan Note (Addendum)
Concerns with elevated blood pressure most likely consistent with an accurate blood pressure measurements As a manual measurement was taken today with a thigh cuff her blood pressure reading was within normal limits -Started 5 mg of amlodipine as patient was anxious about her elevated blood pressure - Advised that she could do a nurse visit and follow-up to  have her blood pressure taken here - encouraged DASH diet and exercise.  - TSH was taken - She has appointment with her pre-PCP on Friday (6th) and will determine at that time if need for antihypertensive

## 2014-09-11 NOTE — Patient Instructions (Signed)
Thank you for coming in,   I will call you with the results or send a letter.   Please have your blood pressure taken in the next 2-3 days.   Please bring all of your medications with you to each visit.    Please feel free to call with any questions or concerns at any time, at [phone]. --Dr. Loreen Freud Eating Plan DASH stands for "Dietary Approaches to Stop Hypertension." The DASH eating plan is a healthy eating plan that has been shown to reduce high blood pressure (hypertension). Additional health benefits may include reducing the risk of type 2 diabetes mellitus, heart disease, and stroke. The DASH eating plan may also help with weight loss. WHAT DO I NEED TO KNOW ABOUT THE DASH EATING PLAN? For the DASH eating plan, you will follow these general guidelines:  Choose foods with a percent daily value for sodium of less than 5% (as listed on the food label).  Use salt-free seasonings or herbs instead of table salt or sea salt.  Check with your health care provider or pharmacist before using salt substitutes.  Eat lower-sodium products, often labeled as "lower sodium" or "no salt added."  Eat fresh foods.  Eat more vegetables, fruits, and low-fat dairy products.  Choose whole grains. Look for the word "whole" as the first word in the ingredient list.  Choose fish and skinless chicken or [place] more often than red meat. Limit fish, poultry, and meat to 6 oz (170 g) each day.  Limit sweets, desserts, sugars, and sugary drinks.  Choose heart-healthy fats.  Limit cheese to 1 oz (28 g) per day.  Eat more home-cooked food and less restaurant, buffet, and fast food.  Limit fried foods.  Cook foods using methods other than frying.  Limit canned vegetables. If you do use them, rinse them well to decrease the sodium.  When eating at a restaurant, ask that your food be prepared with less salt, or no salt if possible. WHAT FOODS CAN I EAT? Seek help from a dietitian for  individual calorie needs. Grains Whole grain or whole wheat bread. Brown rice. Whole grain or whole wheat pasta. Quinoa, bulgur, and whole grain cereals. Low-sodium cereals. Corn or whole wheat flour tortillas. Whole grain cornbread. Whole grain crackers. Low-sodium crackers. Vegetables Fresh or frozen vegetables (raw, steamed, roasted, or grilled). Low-sodium or reduced-sodium tomato and vegetable juices. Low-sodium or reduced-sodium tomato sauce and paste. Low-sodium or reduced-sodium canned vegetables.  Fruits All fresh, canned (in natural juice), or frozen fruits. Meat and Other Protein Products Ground beef (85% or leaner), grass-fed beef, or beef trimmed of fat. Skinless chicken or [place]. Ground chicken or [place]. Pork trimmed of fat. All fish and seafood. Eggs. Dried beans, peas, or lentils. Unsalted nuts and seeds. Unsalted canned beans. Dairy Low-fat dairy products, such as skim or 1% milk, 2% or reduced-fat cheeses, low-fat ricotta or cottage cheese, or plain low-fat yogurt. Low-sodium or reduced-sodium cheeses. Fats and Oils Tub margarines without trans fats. Light or reduced-fat mayonnaise and salad dressings (reduced sodium). Avocado. Safflower, olive, or canola oils. Natural peanut or almond butter. Other Unsalted popcorn and pretzels. The items listed above may not be a complete list of recommended foods or beverages. Contact your dietitian for more options. WHAT FOODS ARE NOT RECOMMENDED? Grains White bread. White pasta. White rice. Refined cornbread. Bagels and croissants. Crackers that contain trans fat. Vegetables Creamed or fried vegetables. Vegetables in a cheese sauce. Regular canned vegetables. Regular canned tomato sauce and paste. Regular  tomato and vegetable juices. Fruits Dried fruits. Canned fruit in light or heavy syrup. Fruit juice. Meat and Other Protein Products Fatty cuts of meat. Ribs, chicken wings, bacon, sausage, bologna, salami, chitterlings, fatback, hot  dogs, bratwurst, and packaged luncheon meats. Salted nuts and seeds. Canned beans with salt. Dairy Whole or 2% milk, cream, half-and-half, and cream cheese. Whole-fat or sweetened yogurt. Full-fat cheeses or blue cheese. Nondairy creamers and whipped toppings. Processed cheese, cheese spreads, or cheese curds. Condiments Onion and garlic salt, seasoned salt, table salt, and sea salt. Canned and packaged gravies. Worcestershire sauce. Tartar sauce. Barbecue sauce. Teriyaki sauce. Soy sauce, including reduced sodium. Steak sauce. Fish sauce. Oyster sauce. Cocktail sauce. Horseradish. Ketchup and mustard. Meat flavorings and tenderizers. Bouillon cubes. Hot sauce. Tabasco sauce. Marinades. Taco seasonings. Relishes. Fats and Oils Butter, stick margarine, lard, shortening, ghee, and bacon fat. Coconut, palm kernel, or palm oils. Regular salad dressings. Other Pickles and olives. Salted popcorn and pretzels. The items listed above may not be a complete list of foods and beverages to avoid. Contact your dietitian for more information. WHERE CAN I FIND MORE INFORMATION? National Heart, Lung, and Blood Institute: travelstabloid.com Document Released: 12/17/2010 Document Revised: 05/14/2013 Document Reviewed: 11/01/2012 [facility] Patient Information 2015 [place], [place]. This information is not intended to replace advice given to you by your health care provider. Make sure you discuss any questions you have with your health care provider.

## 2014-09-12 ENCOUNTER — Encounter: Payer: Self-pay | Admitting: Family Medicine

## 2014-09-17 ENCOUNTER — Ambulatory Visit: Payer: BLUE CROSS/BLUE SHIELD | Admitting: Family Medicine

## 2014-10-07 ENCOUNTER — Encounter: Payer: BLUE CROSS/BLUE SHIELD | Admitting: Family Medicine

## 2014-11-14 NOTE — Telephone Encounter (Signed)
ERROR PLEASE DISREGARD

## 2014-12-25 ENCOUNTER — Encounter: Payer: BLUE CROSS/BLUE SHIELD | Admitting: Family Medicine

## 2015-01-08 ENCOUNTER — Ambulatory Visit (INDEPENDENT_AMBULATORY_CARE_PROVIDER_SITE_OTHER): Payer: 59 | Admitting: Family Medicine

## 2015-01-08 ENCOUNTER — Encounter: Payer: Self-pay | Admitting: Family Medicine

## 2015-01-08 VITALS — BP 168/100 | HR 100 | Temp 97.7°F | Ht 69.0 in | Wt 250.4 lb

## 2015-01-08 DIAGNOSIS — L0292 Furuncle, unspecified: Secondary | ICD-10-CM | POA: Diagnosis not present

## 2015-01-08 MED ORDER — AMOXICILLIN-POT CLAVULANATE 875-125 MG PO TABS: 1.0000 | ORAL_TABLET | Freq: Two times a day (BID) | ORAL | Status: DC

## 2015-01-08 NOTE — Progress Notes (Signed)
   Subjective:    Patient ID: Diane Lloyd, female    DOB: 24-Oct-1990, 24 y.o.   MRN: [id number]  Seen for Same day visit for   CC: boil   She is presenting with boils in her right axilla, her left inguinal area, and near her lower vagina. This started about 5 days ago. She has a history of boils but they have not been this severe in nature. She has no diagnosis of hidradenitis. She has tried warm compress with some drainage from the boil near her vagina. She has never needed antibiotics or incision and drainage before. She is having no vaginal discharge, dysuria, or dyspareunia On Sprintec for birth control Has been sexually active with one partner and has had unprotected sex Unsure if she has been exposed to any STI  Review of Systems   See HPI for ROS. Objective:  BP 168/100 mmHg  Pulse 100  Temp(Src) 97.7 F (36.5 C) (Oral)  Ht 5\' 9"  (1.753 m)  Wt 250 lb 6.4 oz (113.581 kg)  BMI 36.96 kg/m2  General: NAD Respiratory:  normal effort Extremities: no edema or cyanosis. WWP. Skin: Fluctuant area and right axilla that's tender to palpation but no overlying erythema, small lesion in left inguinal region that is tender to palpation but no overlying erythema, lesion on the labia minora near the clitoris  Neuro: alert and oriented, no focal deficits     Assessment & Plan:   Boil Having multiple boils throughout have not been this severe in nature in regards to pain May be developing hidradenitis She is overweight but no diagnosis of diabetes - Initiate Augmentin due to multiple sites and severity of pain - Can use Tylenol or ibuprofen for pain - Encouraged weight loss and recommended a nutritionist. She'll try diet and exercise for now - Advised that she follow up for a Pap smear and reevaluation of the lesion on her labia minora

## 2015-01-08 NOTE — Assessment & Plan Note (Signed)
Having multiple boils throughout have not been this severe in nature in regards to pain May be developing hidradenitis She is overweight but no diagnosis of diabetes - Initiate Augmentin due to multiple sites and severity of pain - Can use Tylenol or ibuprofen for pain - Encouraged weight loss and recommended a nutritionist. She'll try diet and exercise for now - Advised that she follow up for a Pap smear and reevaluation of the lesion on her labia minora

## 2015-01-08 NOTE — Patient Instructions (Signed)
Thank you for coming in,   I have prescribed Augmentin for your boils.  I would continue warm compress as well.  You can use Tylenol or ibuprofen for pain.  Please make an appointment with Dr. Althea CharonKaramalegos to have a Pap smear.  Sign up for My Chart to have easy access to your labs results, and communication with your Primary care physician   Please feel free to call with any questions or concerns at any time, at [phone]. --Dr. Jordan LikesSchmitz

## 2015-01-09 ENCOUNTER — Ambulatory Visit: Payer: BLUE CROSS/BLUE SHIELD | Admitting: Family Medicine

## 2015-01-15 ENCOUNTER — Ambulatory Visit (INDEPENDENT_AMBULATORY_CARE_PROVIDER_SITE_OTHER): Payer: Managed Care, Other (non HMO) | Admitting: Family Medicine

## 2015-01-15 ENCOUNTER — Encounter: Payer: Self-pay | Admitting: Family Medicine

## 2015-01-15 ENCOUNTER — Other Ambulatory Visit (HOSPITAL_COMMUNITY)
Admission: RE | Admit: 2015-01-15 | Discharge: 2015-01-15 | Disposition: A | Discharge status: Home / Self Care | Payer: Managed Care, Other (non HMO) | Source: Ambulatory Visit | Attending: Family Medicine | Admitting: Family Medicine

## 2015-01-15 VITALS — BP 130/86 | HR 91 | Temp 99.2°F | Ht 69.0 in | Wt 250.0 lb

## 2015-01-15 DIAGNOSIS — Z01419 Encounter for gynecological examination (general) (routine) without abnormal findings: Secondary | ICD-10-CM | POA: Insufficient documentation

## 2015-01-15 DIAGNOSIS — I10 Essential (primary) hypertension: Secondary | ICD-10-CM | POA: Diagnosis not present

## 2015-01-15 DIAGNOSIS — M329 Systemic lupus erythematosus, unspecified: Secondary | ICD-10-CM

## 2015-01-15 DIAGNOSIS — K219 Gastro-esophageal reflux disease without esophagitis: Secondary | ICD-10-CM | POA: Diagnosis not present

## 2015-01-15 DIAGNOSIS — Z1151 Encounter for screening for human papillomavirus (HPV): Secondary | ICD-10-CM | POA: Diagnosis not present

## 2015-01-15 DIAGNOSIS — E282 Polycystic ovarian syndrome: Secondary | ICD-10-CM

## 2015-01-15 DIAGNOSIS — Z124 Encounter for screening for malignant neoplasm of cervix: Secondary | ICD-10-CM | POA: Diagnosis not present

## 2015-01-15 DIAGNOSIS — E669 Obesity, unspecified: Secondary | ICD-10-CM

## 2015-01-15 NOTE — Assessment & Plan Note (Signed)
Referral to Rheumatology to establish, anticipate may need further diagnostics and treatment

## 2015-01-15 NOTE — Patient Instructions (Signed)
Thank you for coming in to clinic today.  1. Pap smear completed for Cervical Cancer screening - will get results within about 1 week. We will call you with these. 2. For Chest Pressure, this may be acid reflux or GERD, try taking Zantac 150mg  twice daily for 4 weeks or 1 month, you may need this longer or a different medicine, if needed we can send in rx in future. - Avoid spicy, greasy foods, reduce caffeine, alcohol, don't eat large meals late in evening or lay down 30 min after eating  3. For lupus, sent referral to Rheumatologist, they will manage this further, stay tuned for apt within next few weeks. You may try Naprosyn or Naproxen 500mg  twice daily for 1-2 weeks if needed for the chest pain or muscle aches  Please schedule a follow-up appointment with Dr Althea CharonKaramalegos in 1 month for follow-up BP, GERD  If you have any other questions or concerns, please feel free to call the clinic to contact me. You may also schedule an earlier appointment if necessary.  However, if your symptoms get significantly worse, please go to the Emergency Department to seek immediate medical attention.  Saralyn PilarAlexander Baya Lentz, DO [facility]

## 2015-01-15 NOTE — Assessment & Plan Note (Addendum)
Stable on OCPs - Previously on Metformin  Plan: 1. Continue OCPs, if future BP remains elevated would recommend switch off of OCPs 2. Future consider repeat A1c 3. Counseled on weight loss and improving lifestyle

## 2015-01-15 NOTE — Assessment & Plan Note (Signed)
Counseled on weight loss and lifestyle improvements

## 2015-01-15 NOTE — Assessment & Plan Note (Addendum)
Likely contributing to chest discomfort with GERD-like symptoms, untreated  OTC Trial Zantac 150mg  BID x 1 month, consider rx vs PPI in future. Did not tolerater PPI (protonix by report)

## 2015-01-15 NOTE — Assessment & Plan Note (Addendum)
Elevated BP SBP 150 >> 130, improved on manual re-check. - Counseled on low sodium diet, exercise, dietary changes. Weight loss to improve - Follow-up 1 month re-check BP, consider resuming Amlodipine 5 for anti-HTN in future if needed

## 2015-01-15 NOTE — Progress Notes (Addendum)
Subjective:    Patient ID: Diane Lloyd, female    DOB: 1990-11-20, 25 y.o.   MRN: [id number]  Diane Lloyd is a 25 y.o. female presenting on 01/15/2015 for Gynecologic Exam  HPI  GYN: - History of PCOS, currently taking Sprintec OCP and having regular periods monthly (LMP 12/25/14), does still have cramping during period but initial pre-menstrual cramps resolved, takes aleve during cycle.  - No prior pap smears. - Sexual history with 1 female partner, denies any exposure to STDs. Did have prior history of chlamydia 12/2012 treated and test of cure. - Denies any vaginal discharge, vaginal odor, dysuria, hematuria  ELEVATED BP: - Chronic history of elevated BP, previously on amlodipine, did not continue this medication.  GERD, chest discomfort: - Known chronic history of GERD, previously with episodes of epigastric and chest pains, had been treated with PPI since discontinued and no longer taking. - Currently some chest pains bilaterally, some pains worse after eating, occasionally eats late, some caffeine - Denies nausea, vomiting, abdominal bloating, cough, sore throat, blood in stool or diarrhea  LUPUS: - History of lupus and with mother dx lupus. Patient had labs in 2012 with positive ANA, then repeated with negative ANA and dsDNA 3, negative RF. She denies seeing any rheumatology previously. She has not maintained regular follow-up and denies prior treatment for this problem - Chest pains as above, since significantly improved over past 1 week, now mostly resolved.  Health Maintenance: Offered flu shot - Pap smear today  Social - works as LawyerCNA at [employer]   Past Medical History  Diagnosis Date  . Anemia     iron def  . Obesity, unspecified   . Lupus (HCC)     + ANA in 2012  . Asthma   . PCOS (polycystic ovarian syndrome)    Social History   Social History  . Marital Status: Single    Spouse Name: N/A  . Number of Children: N/A  . Years of Education: N/A    Occupational History  . Not on file.   Social History Main Topics  . Smoking status: Never Smoker   . Smokeless tobacco: Never Used  . Alcohol Use: No  . Drug Use: Yes    Special: Marijuana  . Sexual Activity: Yes    Birth Control/ Protection: None   Other Topics Concern  . Not on file   Social History Narrative   Family History  Problem Relation Age of Onset  . Hypertension Mother   . Lupus Mother   . Diabetes Mother   . Cancer Father 352    esophagus  . Asthma Father   . Cancer Maternal Uncle 40    pancreatic cancer  . Cancer Paternal Grandmother 4860    esophageal   Current Outpatient Prescriptions on File Prior to Visit  Medication Sig  . amLODipine (NORVASC) 5 MG tablet Take 1 tablet (5 mg total) by mouth daily.  Marland Kitchen. amoxicillin-clavulanate (AUGMENTIN) 875-125 MG tablet Take 1 tablet by mouth 2 (two) times daily. For 7 day course.  . cetirizine (ZYRTEC) 10 MG tablet Take 1 tablet (10 mg total) by mouth daily.  . fluticasone (FLONASE) 50 MCG/ACT nasal spray Place 2 sprays into both nostrils 2 (two) times daily.  . SPRINTEC 28 0.25-35 MG-MCG tablet TAKE ONE TABLET BY MOUTH ONCE DAILY   No current facility-administered medications on file prior to visit.    Review of Systems Per HPI unless specifically indicated above     Objective:  BP 130/86 mmHg  Pulse 91  Temp(Src) 99.2 F (37.3 C) (Oral)  Ht 5\' 9"  (1.753 m)  Wt 250 lb (113.399 kg)  BMI 36.90 kg/m2  LMP 12/25/2014  Wt Readings from Last 3 Encounters:  01/15/15 250 lb (113.399 kg)  01/08/15 250 lb 6.4 oz (113.581 kg)  09/11/14 244 lb 8 oz (110.904 kg)    Physical Exam  Constitutional: She is oriented to person, place, and time. She appears well-developed and well-nourished. No distress.  Overweight, well appearing  HENT:  Head: Normocephalic and atraumatic.  Mouth/Throat: Oropharynx is clear and moist.  Eyes: Conjunctivae and EOM are normal. Pupils are equal, round, and reactive to light.   Neck: Normal range of motion. Neck supple. No thyromegaly present.  Cardiovascular: Normal rate, regular rhythm, normal heart sounds and intact distal pulses.   No murmur heard. Pulmonary/Chest: Effort normal and breath sounds normal. No respiratory distress. She has no wheezes. She has no rales. She exhibits tenderness (bilateral anterior chest wall mild tender).  Abdominal: Soft. Bowel sounds are normal. She exhibits no distension and no mass. There is tenderness (mild epigastric).  Genitourinary:  Normal external female genitalia. Vaginal canal without lesions. Normal appearing cervix, without lesions. Pap smear specimen collected (mild bleeding on procedure). Physiologic discharge on exam. Bimanual exam without masses or cervical motion tenderness.  Musculoskeletal: Normal range of motion. She exhibits no edema or tenderness.  Lymphadenopathy:    She has no cervical adenopathy.  Neurological: She is alert and oriented to person, place, and time.  Skin: Skin is warm and dry. No rash noted. She is not diaphoretic.  Psychiatric: She has a normal mood and affect.  Nursing note and vitals reviewed.  Pelvic exam chaperoned by Garen Grams, LPN  Results for orders placed or performed in visit on 09/11/14  TSH  Result Value Ref Range   TSH 1.293 0.350 - 4.500 uIU/mL      Assessment & Plan:   Problem List Items Addressed This Visit    GERD (gastroesophageal reflux disease)    Likely contributing to chest discomfort with GERD-like symptoms, untreated  OTC Trial Zantac 150mg  BID x 1 month, consider rx vs PPI in future. Did not tolerater PPI (protonix by report)      HTN, goal below 140/80    Elevated BP SBP 150 >> 130, improved on manual re-check. - Counseled on low sodium diet, exercise, dietary changes. Weight loss to improve - Follow-up 1 month re-check BP, consider resuming Amlodipine 5 for anti-HTN in future if needed      Lupus [facility])    Referral to Rheumatology to establish,  anticipate may need further diagnostics and treatment      Relevant Orders   Ambulatory referral to Rheumatology   OBESITY, NOS    Counseled on weight loss and lifestyle improvements      Polycystic ovarian syndrome    Stable on OCPs - Previously on Metformin  Plan: 1. Continue OCPs, if future BP remains elevated would recommend switch off of OCPs 2. Future consider repeat A1c 3. Counseled on weight loss and improving lifestyle       Other Visit Diagnoses    Pap smear for cervical cancer screening    -  Primary    Relevant Orders    Cytology - PAP ([place])    Encounter for routine gynecological examination        Relevant Orders    Cytology - PAP ([place])       No orders of the  defined types were placed in this encounter.      Follow up plan: Return in 1 year (on 01/15/2016).  Saralyn Pilar, DO [facility], PGY-3

## 2015-01-16 ENCOUNTER — Telehealth: Payer: Self-pay | Admitting: Family Medicine

## 2015-01-16 NOTE — Telephone Encounter (Signed)
LMOVM for patient to return call. Please advise patient that she has an appointment with Rheumatologist scheduled for Wed, 02/19/15 at 10:00 AM with Dr. Kathi LudwigSyed at  [facility] [address]  [place], [place] [address] [phone]  Patient can call their office if appt does not work with her schedule. Letter will also be sent to pt's address.

## 2015-01-17 LAB — CYTOLOGY - PAP

## 2015-01-20 ENCOUNTER — Telehealth: Payer: Self-pay | Admitting: Family Medicine

## 2015-01-20 NOTE — Telephone Encounter (Signed)
Last OV 01/15/2015, had her first pap smear completed.  Results: Pap Smear 01/15/15 - Negative for malignancy. No high risk HPV detected. Good sample.  Next due for pap smear in about 3 years.  Attempted to call patient, unable to reach, advised on her voicemail that "all lab results were normal" and she may inquire further about these, if she does please provide above Negative Pap Smear results. Also, see other telephone note dated 01/16/15 with apt reminder for new patient to establish at Rheumatology Wed, 02/19/15 at 10:00 AM with Dr. Kathi LudwigSyed at  [facility] [address]  [place], [place] [address] [phone]  Diane PilarAlexander Peniel Biel, DO [facility], PGY-3

## 2015-02-14 ENCOUNTER — Ambulatory Visit: Payer: Managed Care, Other (non HMO) | Admitting: Family Medicine

## 2015-03-04 ENCOUNTER — Ambulatory Visit: Payer: Managed Care, Other (non HMO) | Admitting: Family Medicine

## 2015-03-14 ENCOUNTER — Encounter (HOSPITAL_COMMUNITY): Payer: Self-pay

## 2015-03-14 ENCOUNTER — Emergency Department (HOSPITAL_COMMUNITY)
Admission: EM | Admit: 2015-03-14 | Discharge: 2015-03-14 | Disposition: A | Discharge status: Home / Self Care | Payer: Managed Care, Other (non HMO) | Source: Home / Self Care | Attending: Family Medicine | Admitting: Family Medicine

## 2015-03-14 DIAGNOSIS — H6122 Impacted cerumen, left ear: Secondary | ICD-10-CM | POA: Diagnosis not present

## 2015-03-14 NOTE — Discharge Instructions (Signed)
Cerumen Impaction The structures of the external ear canal secrete a waxy substance known as cerumen. Excess cerumen can build up in the ear canal, causing a condition known as cerumen impaction. Cerumen impaction can cause ear pain and disrupt the function of the ear. The rate of cerumen production differs for each individual. In certain individuals, the configuration of the ear canal may decrease his or her ability to naturally remove cerumen. CAUSES Cerumen impaction is caused by excessive cerumen production or buildup. RISK FACTORS  Frequent use of swabs to clean ears.  Having narrow ear canals.  Having eczema.  Being dehydrated. SIGNS AND SYMPTOMS  Diminished hearing.  Ear drainage.  Ear pain.  Ear itch. TREATMENT Treatment may involve:  Over-the-counter or prescription ear drops to soften the cerumen.  Removal of cerumen by a health care provider. This may be done with:  Irrigation with warm water. This is the most common method of removal.  Ear curettes and other instruments.  Surgery. This may be done in severe cases. HOME CARE INSTRUCTIONS  Take medicines only as directed by your health care provider.  Do not insert objects into the ear with the intent of cleaning the ear. PREVENTION  Do not insert objects into the ear, even with the intent of cleaning the ear. Removing cerumen as a part of normal hygiene is not necessary, and the use of swabs in the ear canal is not recommended.  Drink enough water to keep your urine clear or pale yellow.  Control your eczema if you have it. SEEK MEDICAL CARE IF:  You develop ear pain.  You develop bleeding from the ear.  The cerumen does not clear after you use ear drops as directed.   This information is not intended to replace advice given to you by your health care provider. Make sure you discuss any questions you have with your health care provider.   Document Released: 02/05/2004 Document Revised: 01/18/2014  Document Reviewed: 08/14/2014 Elsevier Interactive Patient Education 2016 Elsevier Inc.  

## 2015-03-14 NOTE — ED Provider Notes (Signed)
CSN: [id number]     Arrival date & time 03/14/15  1946 History   First MD Initiated Contact with Patient 03/14/15 2118     Chief Complaint  Patient presents with  . Cerumen Impaction   (Consider location/radiation/quality/duration/timing/severity/associated sxs/prior Treatment) HPI Comments: 25 year old female complaining of upper flaccid left ear, minor discomfort and decrease in hearing.   Past Medical History  Diagnosis Date  . Anemia     iron def  . Obesity, unspecified   . Lupus (HCC)     + ANA in 2012  . Asthma   . PCOS (polycystic ovarian syndrome)    Past Surgical History  Procedure Laterality Date  . Tonsillectomy     Family History  Problem Relation Age of Onset  . Hypertension Mother   . Lupus Mother   . Diabetes Mother   . Cancer Father 5752    esophagus  . Asthma Father   . Cancer Maternal Uncle 40    pancreatic cancer  . Cancer Paternal Grandmother 8660    esophageal   Social History  Substance Use Topics  . Smoking status: Never Smoker   . Smokeless tobacco: Never Used  . Alcohol Use: Yes     Comment: occasional   OB History    Gravida Para Term Preterm AB TAB SAB Ectopic Multiple Living   0              Review of Systems  Constitutional: Negative.   HENT: Positive for hearing loss. Negative for congestion, ear discharge and postnasal drip.        As per history of present illness  Respiratory: Negative.   Musculoskeletal: Negative.   All other systems reviewed and are negative.   Allergies  Review of patient's allergies indicates no known allergies.  Home Medications   Prior to Admission medications   Medication Sig Start Date End Date Taking? Authorizing Provider  amLODipine (NORVASC) 5 MG tablet Take 1 tablet (5 mg total) by mouth daily. 09/11/14   Myra RudeJeremy E Schmitz, MD  amoxicillin-clavulanate (AUGMENTIN) 875-125 MG tablet Take 1 tablet by mouth 2 (two) times daily. For 7 day course. 01/08/15   Myra RudeJeremy E Schmitz, MD  cetirizine (ZYRTEC)  10 MG tablet Take 1 tablet (10 mg total) by mouth daily. 08/07/14   Erasmo DownerAngela M Bacigalupo, MD  fluticasone (FLONASE) 50 MCG/ACT nasal spray Place 2 sprays into both nostrils 2 (two) times daily. 08/07/14   Erasmo DownerAngela M Bacigalupo, MD  SPRINTEC 28 0.25-35 MG-MCG tablet TAKE ONE TABLET BY MOUTH ONCE DAILY 07/09/14   Smitty CordsAlexander J Karamalegos, DO   Meds Ordered and Administered this Visit  Medications - No data to display  BP 159/94 mmHg  Pulse 94  Temp(Src) 98 F (36.7 C) (Oral)  Resp 16  SpO2 100%  LMP 03/13/2015 (Exact Date) No data found.   Physical Exam  Constitutional: She is oriented to person, place, and time. She appears well-developed and well-nourished. No distress.  HENT:  Head: Normocephalic and atraumatic.  TM examined post-irrigation. Cerumen has been removed. TM appears normal. No erythema, bleeding, evidence of trauma or infection. Right TM normal-appearing. Only a small amount of cerumen in the right EAC.  Eyes: Conjunctivae and EOM are normal.  Neck: Normal range of motion. Neck supple.  Cardiovascular: Normal rate.   Pulmonary/Chest: Effort normal. No respiratory distress.  Neurological: She is alert and oriented to person, place, and time. No cranial nerve deficit. She exhibits normal muscle tone.  Skin: Skin is warm and dry.  Psychiatric: She has a normal mood and affect.  Nursing note and vitals reviewed.   ED Course  .Ear Cerumen Removal Date/Time: 03/14/2015 9:26 PM Performed by: Phineas Real, Elyshia Kumagai Authorized by: Bradd Canary D Consent: Verbal consent obtained. Risks and benefits: risks, benefits and alternatives were discussed Consent given by: patient Patient understanding: patient states understanding of the procedure being performed Patient identity confirmed: verbally with patient Local anesthetic: none Location details: left ear Procedure type: irrigation Patient sedated: no Patient tolerance: Patient tolerated the procedure well with no immediate  complications   (including critical care time)  Labs Review Labs Reviewed - No data to display  Imaging Review No results found.   Visual Acuity Review  Right Eye Distance:   Left Eye Distance:   Bilateral Distance:    Right Eye Near:   Left Eye Near:    Bilateral Near:         MDM   1. Cerumen impaction, left    Left ear was irrigated and now clear. TM is normal. No erythema, bulging or other injury. No bleeding. Patient states she feels better can hear better. Instructions on cerumen impaction and cleaning the ear given.   Hayden Rasmussen, NP 03/14/15 2127

## 2015-03-14 NOTE — ED Notes (Signed)
Patient presents with left ear impaction x2 days No acute distress

## 2015-05-14 ENCOUNTER — Encounter: Payer: Self-pay | Admitting: Internal Medicine

## 2015-05-14 ENCOUNTER — Other Ambulatory Visit (HOSPITAL_COMMUNITY)
Admission: RE | Admit: 2015-05-14 | Discharge: 2015-05-14 | Disposition: A | Discharge status: Home / Self Care | Payer: Managed Care, Other (non HMO) | Source: Ambulatory Visit | Attending: Family Medicine | Admitting: Family Medicine

## 2015-05-14 ENCOUNTER — Ambulatory Visit (INDEPENDENT_AMBULATORY_CARE_PROVIDER_SITE_OTHER): Payer: Managed Care, Other (non HMO) | Admitting: Internal Medicine

## 2015-05-14 VITALS — BP 154/84 | HR 73 | Temp 98.1°F | Ht 69.0 in | Wt 258.8 lb

## 2015-05-14 DIAGNOSIS — N898 Other specified noninflammatory disorders of vagina: Secondary | ICD-10-CM

## 2015-05-14 DIAGNOSIS — Z113 Encounter for screening for infections with a predominantly sexual mode of transmission: Secondary | ICD-10-CM | POA: Diagnosis not present

## 2015-05-14 LAB — POCT WET PREP (WET MOUNT): Clue Cells Wet Prep Whiff POC: POSITIVE

## 2015-05-14 MED ORDER — METRONIDAZOLE 500 MG PO TABS: 500.0000 mg | ORAL_TABLET | Freq: Two times a day (BID) | ORAL | Status: DC

## 2015-05-14 NOTE — Patient Instructions (Signed)
It was nice meeting you today Ms. Diane Lloyd!  Please take metronidazole 500mg  twice a day for the next seven days. If your symptoms do not improve, please call to schedule another appointment.   We will let you know the results of your other tests.   If you have any questions or concerns, please feel free to call the office.   Be well,  Dr. Natale MilchLancaster

## 2015-05-14 NOTE — Progress Notes (Signed)
   Subjective:    Patient ID: Diane Lloyd, female    DOB: 27-Feb-1990, 25 y.o.   MRN: [id number]  HPI  Patient presents requesting STD screening for vaginal discharge.   Vaginal discharge Patient reports tan malodorous vaginal discharge for the past couple months. Has a new female sexual partner and has had unprotected intercourse. Denies abdominal pain, vaginal itching or irritation. Denies dysuria. Denies fevers, rashes. Endorses joint pain, however patient has SLE and says pain is not worse than usual. Is on Sprintec for contraception.   Does not use tobacco products.   Review of Systems See HPI.     Objective:   Physical Exam  Constitutional: She is oriented to person, place, and time. She appears well-developed and well-nourished. No distress.  Pulmonary/Chest: Effort normal.  Genitourinary: Vaginal discharge (Off-white, frothy, fishy odor) found.  No cervical motion tenderness  Neurological: She is alert and oriented to person, place, and time.  Skin: Skin is warm and dry.  Psychiatric: She has a normal mood and affect. Her behavior is normal.  Vitals reviewed.     Assessment & Plan:  Vaginal discharge Likely 2/2 bacterial vaginosis, as wet prep with clue cells.  - Metronidazole 500mg  BID x7d - F/u GC/chlamydia, HIV   Tarri AbernethyAbigail J Kamrynn Melott, MD PGY-1 [facility] Pager [phone]

## 2015-05-14 NOTE — Assessment & Plan Note (Signed)
Likely 2/2 bacterial vaginosis, as wet prep with clue cells.  - Metronidazole 500mg  BID x7d - F/u GC/chlamydia, HIV

## 2015-05-15 LAB — GC/CHLAMYDIA PROBE AMP (~~LOC~~) NOT AT ARMC
Chlamydia: NEGATIVE
Neisseria Gonorrhea: NEGATIVE

## 2015-05-15 LAB — HIV ANTIBODY (ROUTINE TESTING W REFLEX): HIV 1&2 Ab, 4th Generation: NONREACTIVE

## 2015-05-16 ENCOUNTER — Telehealth: Payer: Self-pay | Admitting: Internal Medicine

## 2015-05-16 ENCOUNTER — Encounter: Payer: Self-pay | Admitting: Internal Medicine

## 2015-05-16 NOTE — Telephone Encounter (Signed)
Called patient to inform her that HIV and GC/chlamydia tests were negative. Patient voiced understanding.   Tarri AbernethyAbigail J Maudell Stanbrough, MD PGY-1 [facility] Pager [phone]

## 2015-05-19 ENCOUNTER — Ambulatory Visit: Payer: Managed Care, Other (non HMO) | Admitting: Family Medicine

## 2015-05-30 ENCOUNTER — Encounter: Payer: Self-pay | Admitting: Family Medicine

## 2015-05-30 ENCOUNTER — Ambulatory Visit (INDEPENDENT_AMBULATORY_CARE_PROVIDER_SITE_OTHER): Payer: Managed Care, Other (non HMO) | Admitting: Family Medicine

## 2015-05-30 VITALS — BP 141/79 | HR 82 | Temp 99.0°F | Ht 70.0 in | Wt 259.0 lb

## 2015-05-30 DIAGNOSIS — J029 Acute pharyngitis, unspecified: Secondary | ICD-10-CM

## 2015-05-30 DIAGNOSIS — J028 Acute pharyngitis due to other specified organisms: Principal | ICD-10-CM

## 2015-05-30 DIAGNOSIS — B9789 Other viral agents as the cause of diseases classified elsewhere: Secondary | ICD-10-CM

## 2015-05-30 NOTE — Progress Notes (Signed)
   Subjective:    Patient ID: Diane Lloyd, female    DOB: 1990-06-05, 25 y.o.   MRN: [id number]  Seen for Same day visit for   CC: Sore throat  SORE THROAT  Sore throat began 2 days ago. Pain is: burning Severity: moderate Medications tried: none Strep throat exposure: no STD exposure: no  Symptoms Fever: no Cough: no Runny nose: yes Muscle aches: yes Swollen Glands: no Trouble breathing: no Drooling: no Weight loss: no  Patient believes could be caused by: Virus  Review of Symptoms - see HPI PMH - Smoking status noted.      See HPI for ROS. Objective:  BP 141/79 mmHg  Pulse 82  Temp(Src) 99 F (37.2 C) (Oral)  Ht 5\' 10"  (1.778 m)  Wt 259 lb (117.482 kg)  BMI 37.16 kg/m2  SpO2 97%  LMP 05/09/2015  General: NAD HEENT: Mild pharyngeal erythema; no tonsillar exudates; nasal turbinates swollen; PERRLA, EOMI Cardiac: RRR, normal heart sounds, no murmurs. 2+ radial and PT pulses bilaterally Respiratory: CTAB, normal effort Abdomen: soft, nontender, nondistended, no hepatic or splenomegaly. Bowel sounds present Skin: warm and dry, no rashes noted    Assessment & Plan:   1. Sore throat (viral) - Symptoms consistent with viral URI.  - Discussed symptomatic treatment and return precautions

## 2015-05-30 NOTE — Patient Instructions (Signed)
Your symptoms are due to a viral illness. Antibiotics will not help improve your symptoms, but the following will help you feel better while your body fights the virus.   Drink lots of water (Guaifenesin "Mucinex")  Nasal Saline Spray  Congestion:   Nose spray: Afrin (Phenylephrine). DO NOT USE MORE THAN 3 DAYS  Oral: Pseudoephedrine  Sneezing & Runny nose: Antihistamines: Zyrtec, Claritin, Allegra  Pain/Sore throat: Tylenol, Ibuprofen  Wash your hands often to prevent spreading the virus

## 2015-06-06 ENCOUNTER — Other Ambulatory Visit: Payer: Self-pay | Admitting: Family Medicine

## 2015-06-06 DIAGNOSIS — E282 Polycystic ovarian syndrome: Secondary | ICD-10-CM

## 2015-06-26 ENCOUNTER — Encounter: Payer: Self-pay | Admitting: Family Medicine

## 2015-06-26 ENCOUNTER — Ambulatory Visit (INDEPENDENT_AMBULATORY_CARE_PROVIDER_SITE_OTHER): Payer: Self-pay | Admitting: Family Medicine

## 2015-06-26 ENCOUNTER — Other Ambulatory Visit (HOSPITAL_COMMUNITY)
Admission: RE | Admit: 2015-06-26 | Discharge: 2015-06-26 | Disposition: A | Discharge status: Home / Self Care | Payer: Self-pay | Source: Ambulatory Visit | Attending: Family Medicine | Admitting: Family Medicine

## 2015-06-26 VITALS — BP 174/83 | HR 89 | Temp 99.1°F | Wt 261.4 lb

## 2015-06-26 DIAGNOSIS — Z113 Encounter for screening for infections with a predominantly sexual mode of transmission: Secondary | ICD-10-CM

## 2015-06-26 DIAGNOSIS — N949 Unspecified condition associated with female genital organs and menstrual cycle: Secondary | ICD-10-CM

## 2015-06-26 DIAGNOSIS — N898 Other specified noninflammatory disorders of vagina: Secondary | ICD-10-CM

## 2015-06-26 LAB — POCT WET PREP (WET MOUNT): Clue Cells Wet Prep Whiff POC: NEGATIVE

## 2015-06-26 MED ORDER — FLUCONAZOLE 150 MG PO TABS: 150.0000 mg | ORAL_TABLET | Freq: Once | ORAL | Status: DC

## 2015-06-26 NOTE — Progress Notes (Signed)
    Subjective:  Diane Lloyd is a 25 y.o. female who [facility] for same day appointment with a chief complaint of vaginal discharge.   HPI:  Vaginal Discharge Present for 2-3 days. Described as a white, thick discharge, like "cottage cheese." Tried monostat which helped some but did not resolve it. Has unprotected intercourse recently, would like to be checked for all STDs. No abdominal pain. No fevers or chills.   Genital Lesions Also first noticed a few days ago. Located on the left side of vagina. Occasionally sore. Described as a "blister." No drainage.   ROS: Per HPI  PMH: Smoking history reviewed.    Objective:  Physical Exam: BP 174/83 mmHg  Pulse 89  Temp(Src) 99.1 F (37.3 C) (Oral)  Wt 261 lb 6.4 oz (118.57 kg)  SpO2 99%  LMP 06/08/2015  Gen: NAD, resting comfortably CV: RRR with no murmurs appreciated Pulm: NWOB, CTAB with no crackles, wheezes, or rhonchi GI: Normal bowel sounds present. Soft, Nontender, Nondistended. GU: 3 small discrete hyperpigmented papules noted on left labia majora, otherwise normal external female genitalia. Scant white discharge noted in vaginal vault without other lesions.  MSK: no edema, cyanosis, or clubbing noted Skin: warm, dry Neuro: grossly normal, moves all extremities Psych: Normal affect and thought content  Results for orders placed or performed in visit on 06/26/15 (from the past 72 hour(s))  POCT Wet Prep Mellody Drown(Wet [place])     Status: None   Collection Time: 06/26/15  4:20 PM  Result Value Ref Range   Source Wet Prep POC VAG    WBC, Wet Prep HPF POC 0-3    Bacteria Wet Prep HPF POC Few None, Few, Too numerous to count   Clue Cells Wet Prep HPF POC None None, Too numerous to count   Clue Cells Wet Prep Whiff POC Negative Whiff    Yeast Wet Prep HPF POC None    Trichomonas Wet Prep HPF POC NONE      Assessment/Plan:  Vaginal Discharge Wet prep negative. Possibly partial treated yeast infection. Will  send in one dose of diflucan. Instructed patient to fill prescription if symptoms not improving within 2-3 days. CT/CT sent. HIV and RPR drawn.  Genital Lesions Most consistent with folliculitis. No open vesicles consistent with herpes, though cannot rule out. Will send for HSV titers.   Katina Degreealeb M. Jimmey RalphParker, MD [facility] Resident PGY-2 06/26/2015 5:06 PM

## 2015-06-26 NOTE — Patient Instructions (Signed)
Your wet prep was negative. It will take a few more days for the rest of your tests to come back.  You may have a partially treated yeast infection. If your symptoms are not improving in a few days, please try taking diflucan.  Take care,  Dr Jimmey RalphParker

## 2015-06-27 LAB — HSV 1 ANTIBODY, IGG: HSV 1 Glycoprotein G Ab, IgG: 9.31 Index — ABNORMAL HIGH (ref <0.90)

## 2015-06-27 LAB — RPR

## 2015-06-27 LAB — HIV ANTIBODY (ROUTINE TESTING W REFLEX): HIV 1&2 Ab, 4th Generation: NONREACTIVE

## 2015-06-27 LAB — HSV 2 ANTIBODY, IGG: HSV 2 Glycoprotein G Ab, IgG: <0.9 Index (ref <0.90)

## 2015-06-30 LAB — CERVICOVAGINAL ANCILLARY ONLY
Chlamydia: NEGATIVE
Neisseria Gonorrhea: NEGATIVE

## 2015-07-01 ENCOUNTER — Encounter: Payer: Self-pay | Admitting: Family Medicine

## 2015-08-29 ENCOUNTER — Ambulatory Visit: Payer: Self-pay | Admitting: Internal Medicine

## 2015-08-29 ENCOUNTER — Ambulatory Visit (INDEPENDENT_AMBULATORY_CARE_PROVIDER_SITE_OTHER): Payer: Self-pay | Admitting: Internal Medicine

## 2015-08-29 ENCOUNTER — Encounter: Payer: Self-pay | Admitting: Internal Medicine

## 2015-08-29 VITALS — BP 146/75 | HR 83 | Temp 98.9°F | Ht 69.0 in | Wt 255.6 lb

## 2015-08-29 DIAGNOSIS — N920 Excessive and frequent menstruation with regular cycle: Secondary | ICD-10-CM

## 2015-08-29 DIAGNOSIS — I10 Essential (primary) hypertension: Secondary | ICD-10-CM

## 2015-08-29 DIAGNOSIS — N946 Dysmenorrhea, unspecified: Secondary | ICD-10-CM

## 2015-08-29 DIAGNOSIS — R5382 Chronic fatigue, unspecified: Secondary | ICD-10-CM

## 2015-08-29 DIAGNOSIS — R21 Rash and other nonspecific skin eruption: Secondary | ICD-10-CM

## 2015-08-29 DIAGNOSIS — L309 Dermatitis, unspecified: Secondary | ICD-10-CM

## 2015-08-29 LAB — CBC WITH DIFFERENTIAL/PLATELET
Basophils Absolute: 0 cells/uL (ref 0–200)
Basophils Relative: 0 %
Eosinophils Absolute: 53 cells/uL (ref 15–500)
Eosinophils Relative: 1 %
HCT: 35 % (ref 35.0–45.0)
Hemoglobin: 11.3 g/dL — ABNORMAL LOW (ref 11.7–15.5)
Lymphocytes Relative: 32 %
Lymphs Abs: 1696 cells/uL (ref 850–3900)
MCH: 25.9 pg — ABNORMAL LOW (ref 27.0–33.0)
MCHC: 32.3 g/dL (ref 32.0–36.0)
MCV: 80.3 fL (ref 80.0–100.0)
MPV: 9.3 fL (ref 7.5–12.5)
Monocytes Absolute: 371 cells/uL (ref 200–950)
Monocytes Relative: 7 %
Neutro Abs: 3180 cells/uL (ref 1500–7800)
Neutrophils Relative %: 60 %
Platelets: 329 10*3/uL (ref 140–400)
RBC: 4.36 MIL/uL (ref 3.80–5.10)
RDW: 14.6 % (ref 11.0–15.0)
WBC: 5.3 10*3/uL (ref 3.8–10.8)

## 2015-08-29 MED ORDER — NAPROXEN 500 MG PO TABS: ORAL_TABLET | ORAL | 2 refills | Status: DC

## 2015-08-29 MED ORDER — TRIAMCINOLONE ACETONIDE 0.025 % EX OINT: 1.0000 "application " | TOPICAL_OINTMENT | Freq: Four times a day (QID) | CUTANEOUS | 0 refills | Status: DC

## 2015-08-29 NOTE — Patient Instructions (Signed)
Ms. Sherrine MaplesGlenn,  I have prescribed triamcinolone cream (another steroid cream) to help with dryness which seems consistent with eczema.   For your heavy periods, I recommend taking naproxen 500 mg with start of bleeding followed by another 500 mg a few hours later; then continue naproxen twice daily for rest of cycle.   I will call you with result of blood work looking for anemia.   Best, Dr. Sampson GoonFitzgerald

## 2015-08-29 NOTE — Progress Notes (Signed)
[facility] Progress [facility]  Subjective:  Diane CutterDeneshia Lloyd is a 25-y/o female with history of HTN, lupus, and PCOS who presents for SDA for rash.  Rash: - Itchy rash behind R knee x 3 weeks - Has been using hydrocortisone cream that helps - Does spend time in her backyard but denies any known bug bites - Worried about scabies because rash is linear with bumps - No known contacts with rashes ROS: No fevers/chills, no arthralgias   Heavy periods with cramps: - Has been going on since she started menstruating, over 10 years ago - Has been on pill for about 3 years without improvement in periods - Complains of 9/10 pain with current period - LMP started 8/16 ROS: complains of increased fatigue  No Known Allergies  Social: Never smoker  Objective: Blood pressure (!) 146/75, pulse 83, temperature 98.9 F (37.2 C), temperature source Oral, height 5\' 9"  (1.753 m), weight 255 lb 9.6 oz (115.9 kg), last menstrual period 08/27/2015. Has not taken amlodipine today.  Constitutional: Well-appearing female, in NAD Abdominal: Soft. +BS, NT, ND, no rebound or guarding.  Musculoskeletal: ROM intact, no swelling or obvious effusion of knee joints bilaterally Skin: Dry skin behind R knee with linear excoriated area with few scattered papules.   Vitals reviewed    Assessment/Plan: Rash and nonspecific skin eruption - Area of rash would be uncommon site for scabies, which more typically affects extensor surface of knee. Improves with hydrocortisone cream and linear distribution favors perhaps a slowly resolving contact dermatitis. Extensive dryness may also indicate eczema.  - Prescribed triamcinolone cream.   Dysmenorrhea - With menorrhagia. Chronic. - Prescribed naproxen and advised taking with cycle. - If NSAIDs do not help, consider trying oral progestogen vs IUD - Will obtain CBC to r/o worsening anemia  HTN, goal below 140/80 - Provided refill for amlodipine. - Will need  BP recheck within the month  Follow-up within a month for BP recheck.  Dani GobbleHillary Fitzgerald, MD [facility], PGY-2

## 2015-08-31 DIAGNOSIS — N946 Dysmenorrhea, unspecified: Secondary | ICD-10-CM | POA: Insufficient documentation

## 2015-08-31 DIAGNOSIS — R21 Rash and other nonspecific skin eruption: Secondary | ICD-10-CM | POA: Insufficient documentation

## 2015-08-31 DIAGNOSIS — N920 Excessive and frequent menstruation with regular cycle: Secondary | ICD-10-CM | POA: Insufficient documentation

## 2015-08-31 MED ORDER — AMLODIPINE BESYLATE 5 MG PO TABS: 5.0000 mg | ORAL_TABLET | Freq: Every day | ORAL | 0 refills | Status: DC

## 2015-08-31 NOTE — Assessment & Plan Note (Signed)
-   Area of rash would be uncommon site for scabies, which more typically affects extensor surface of knee. Improves with hydrocortisone cream and linear distribution favors perhaps a slowly resolving contact dermatitis. Extensive dryness may also indicate eczema.  - Prescribed triamcinolone cream.

## 2015-08-31 NOTE — Assessment & Plan Note (Addendum)
-   With menorrhagia. Chronic. - Prescribed naproxen and advised taking with cycle. - If NSAIDs do not help, consider trying oral progestogen vs IUD - Will obtain CBC to r/o worsening anemia

## 2015-08-31 NOTE — Assessment & Plan Note (Signed)
-   Provided refill for amlodipine. - Will need BP recheck within the month

## 2015-09-01 ENCOUNTER — Telehealth: Payer: Self-pay | Admitting: Internal Medicine

## 2015-09-01 NOTE — Telephone Encounter (Signed)
Called patient to inform her of CBC results that did not show worsening anemia. She said she has a new area of bumps and itching on her leg and worsening itching of a bump near her right thigh. Suggested trying benadryl and to let us know if that did not help. At recent office visit, distribution of rash did not seem consistent with scabies but would have greater concern if rash continues to spread. She reports that naproxen has helped her menstrual cramps.

## 2015-09-16 ENCOUNTER — Other Ambulatory Visit: Payer: Self-pay | Admitting: Family Medicine

## 2015-09-16 DIAGNOSIS — E282 Polycystic ovarian syndrome: Secondary | ICD-10-CM

## 2015-09-16 MED ORDER — NORGESTIMATE-ETH ESTRADIOL 0.25-35 MG-MCG PO TABS: 1.0000 | ORAL_TABLET | Freq: Every day | ORAL | 0 refills | Status: DC

## 2015-09-16 NOTE — Telephone Encounter (Signed)
Pt is calling because she needs a refill on her birth control. jw

## 2015-09-16 NOTE — Telephone Encounter (Signed)
One month supply sent to pharmacy.  Patient needs to schedule an appt with Dr Beryle FlockBacigalupo for this medication.  It appears that her BP has been elevated during several office visits and there has been discussion about switching patient to an alternative therapy.  Please advise patient.

## 2015-09-17 NOTE — Telephone Encounter (Signed)
LVM for pt to call back to inform her of below. Zimmerman Rumple, April D, CMA  

## 2015-09-17 NOTE — Telephone Encounter (Signed)
Pt called back and informed her of below and scheduled her an appointment with her PCP to discuss this. Diane Lloyd, Diane Lloyd, [place]

## 2015-09-19 ENCOUNTER — Ambulatory Visit (INDEPENDENT_AMBULATORY_CARE_PROVIDER_SITE_OTHER): Payer: Self-pay | Admitting: Family Medicine

## 2015-09-19 ENCOUNTER — Other Ambulatory Visit (HOSPITAL_COMMUNITY)
Admission: RE | Admit: 2015-09-19 | Discharge: 2015-09-19 | Disposition: A | Discharge status: Home / Self Care | Payer: Self-pay | Source: Ambulatory Visit | Attending: Family Medicine | Admitting: Family Medicine

## 2015-09-19 ENCOUNTER — Encounter: Payer: Self-pay | Admitting: Family Medicine

## 2015-09-19 VITALS — BP 118/60 | HR 82 | Temp 98.2°F | Ht 69.0 in | Wt 258.2 lb

## 2015-09-19 DIAGNOSIS — Z113 Encounter for screening for infections with a predominantly sexual mode of transmission: Secondary | ICD-10-CM

## 2015-09-19 LAB — POCT WET PREP (WET MOUNT)
Clue Cells Wet Prep Whiff POC: NEGATIVE
Trichomonas Wet Prep HPF POC: ABSENT

## 2015-09-19 MED ORDER — MICONAZOLE NITRATE 200 MG VA SUPP: 200.0000 mg | Freq: Every day | VAGINAL | 1 refills | Status: DC

## 2015-09-19 NOTE — Progress Notes (Signed)
Subjective:    Patient ID: Diane Lloyd , female   DOB: 1990/01/24 , 25 y.o..   MRN: [id number]  HPI  Diane ClamDeneshia D Giese is here for STD check. Patient states that she is here to get an STD check because she had unprotected sex with a new partner. She has had sex with his partner before couple years ago but he went to jail and then when he got out in August she had sex with him. Patient is on birth control, she takes Sprintec daily. She denies any vaginal discharge, spotting, pelvic pain, cramping, fever. She thinks that she may have yeast infection because she has vaginal itching, she has had these symptoms before. No dysuria or back pain. She has had an STD before in 2014, Chlamydia. She was treated for this.  Review of Systems: Per HPI. All other systems reviewed and are negative.  Past Medical History: Patient Active Problem List   Diagnosis Date Noted  . Screen for STD (sexually transmitted disease) 09/21/2015  . Rash and nonspecific skin eruption 08/31/2015  . Dysmenorrhea 08/31/2015  . Elevated BP 09/11/2014  . Allergic rhinitis 05/17/2013  . Lupus (HCC)   . GERD (gastroesophageal reflux disease) 03/20/2012  . Polycystic ovarian syndrome 12/26/2011  . Family history of cardiac disorder 02/17/2011  . 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 HTN, goal below 140/80 01/27/2010  . ANEMIA, IRON DEFICIENCY 06/27/2009  . OBESITY, NOS 03/10/2006  . ASTHMA, INTERMITTENT 03/10/2006  . ECZEMA, ATOPIC DERMATITIS 03/10/2006    Social Hx:  reports that she has never smoked. She has never used smokeless tobacco.    Objective:   BP 118/60 (BP Location: Right Arm, Patient Position: Sitting, Cuff Size: Large)   Pulse 82   Temp 98.2 F (36.8 C) (Oral)   Ht 5\' 9"  (1.753 m)   Wt 258 lb 3.2 oz (117.1 kg)   LMP 08/27/2015   SpO2 99%   BMI 38.13 kg/m  Physical Exam  Gen: NAD, alert, cooperative with exam, well-appearing Cardiac: Regular rate and rhythm,  normal S1/S2, no murmur, no edema, capillary refill brisk  Respiratory: Clear to auscultation bilaterally, no wheezes, non-labored breathing Gastrointestinal: soft, non tender, non distended, bowel sounds present Psych: good insight, normal mood and affect GYN:  External genitalia within normal limits.  Vaginal mucosa pink, moist, normal rugae.  Nonfriable cervix without lesions. Creamy white discharge and slight amount of bright red blood noted on speculum exam near the external os. Bimanual exam revealed normal, nongravid uterus. No cervical motion tenderness. No adnexal masses bilaterally.     Assessment & Plan:  Screen for STD (sexually transmitted disease) Screened for STDs as patient concerned after having unprotected sex last month. Has had some mild vaginal itching but otherwise asymptomatic. Exam revealing some creamy discharge and blood at external os, patient admits she is starting her period. Wet prep, GC/Chalmydia ordered. Wet prep not showing trich, yeast, or significant clue cells that would indicate BV. No pregnancy test performed as [facility].  -Will continue to wait for GC/Chlamydia results. -No HIV or syphilis testing ordered as patient was recently tested in June 2017 and was negative.  -Discussed that she can come back in a few weeks if she would like to be tested again for syphilis or HIV as it would take some time for these to be positive.  -Provided prescription for miconazole for future yeast infections as patient states she gets these once in awhile  Anders Simmonds, MD [facility], PGY-2

## 2015-09-19 NOTE — Patient Instructions (Signed)
Thank you for coming in today, it was so nice to see you! Today we talked about:    STDS: please use condoms with sexual intercourse to prevent STDS   If we ordered any tests today, you will be notified via telephone of any abnormalities. If everything is normal you will get a letter in the mail.   If you have any questions or concerns, please do not hesitate to call the office at [phone]. You can also message me directly via MyChart.   Sincerely,  Anders Simmonds, MD  Safe Sex Safe sex is about reducing the risk of giving or getting a sexually transmitted disease (STD). STDs are spread through sexual contact involving the genitals, mouth, or rectum. Some STDs can be cured and others cannot. Safe sex can also prevent unintended pregnancies.  WHAT ARE SOME SAFE SEX PRACTICES?  Limit your sexual activity to only one partner who is having sex with only you.  Talk to your partner about his or her past partners, past STDs, and drug use.  Use a condom every time you have sexual intercourse. This includes vaginal, oral, and anal sexual activity. Both females and males should wear condoms during oral sex. Only use latex or polyurethane condoms and water-based lubricants. Using petroleum-based lubricants or oils to lubricate a condom will weaken the condom and increase the chance that it will break. The condom should be in place from the beginning to the end of sexual activity. Wearing a condom reduces, but does not completely eliminate, your risk of getting or giving an STD. STDs can be spread by contact with infected body fluids and skin.  Get vaccinated for hepatitis B and HPV.  Avoid alcohol and recreational drugs, which can affect your judgment. You may forget to use a condom or participate in high-risk sex.  For females, avoid douching after sexual intercourse. Douching can spread an infection farther into the reproductive tract.  Check your body for signs of sores, blisters,  rashes, or unusual discharge. See your health care provider if you notice any of these signs.  Avoid sexual contact if you have symptoms of an infection or are being treated for an STD. If you or your partner has herpes, avoid sexual contact when blisters are present. Use condoms at all other times.  If you are at risk of being infected with HIV, it is recommended that you take a prescription medicine daily to prevent HIV infection. This is called pre-exposure prophylaxis (PrEP). You are considered at risk if:  You are a man who has sex with other men (MSM).  You are a heterosexual man or woman who is sexually active with more than one partner.  You take drugs by injection.  You are sexually active with a partner who has HIV.  Talk with your health care provider about whether you are at high risk of being infected with HIV. If you choose to begin PrEP, you should first be tested for HIV. You should then be tested every 3 months for as long as you are taking PrEP.  See your health care provider for regular screenings, exams, and tests for other STDs. Before having sex with a new partner, each of you should be screened for STDs and should talk about the results with each other. WHAT ARE THE BENEFITS OF SAFE SEX?   There is less chance of getting or giving an STD.  You can prevent unwanted or unintended pregnancies.  By discussing safe sex concerns with your partner,  you may increase feelings of intimacy, comfort, trust, and honesty between the two of you.   This information is not intended to replace advice given to you by your health care provider. Make sure you discuss any questions you have with your health care provider.   Document Released: 02/05/2004 Document Revised: 01/18/2014 Document Reviewed: 06/21/2011 Elsevier Interactive Patient Education [employer] Elsevier Inc.

## 2015-09-21 DIAGNOSIS — Z113 Encounter for screening for infections with a predominantly sexual mode of transmission: Secondary | ICD-10-CM | POA: Insufficient documentation

## 2015-09-21 NOTE — Assessment & Plan Note (Addendum)
Screened for STDs as patient concerned after having unprotected sex last month. Has had some mild vaginal itching but otherwise asymptomatic. Exam revealing some creamy discharge and blood at external os, patient admits she is starting her period. Wet prep, GC/Chalmydia ordered. Wet prep not showing trich, yeast, or significant clue cells that would indicate BV. No pregnancy test performed as [facility].  -Will continue to wait for GC/Chlamydia results. -No HIV or syphilis testing ordered as patient was recently tested in June 2017 and was negative.  -Discussed that she can come back in a few weeks if she would like to be tested again for syphilis or HIV as it would take some time for these to be positive.  -Provided prescription for miconazole for future yeast infections as patient states she gets these once in awhile

## 2015-09-22 ENCOUNTER — Telehealth: Payer: Self-pay | Admitting: Family Medicine

## 2015-09-22 LAB — CERVICOVAGINAL ANCILLARY ONLY
Chlamydia: NEGATIVE
Neisseria Gonorrhea: NEGATIVE

## 2015-09-22 NOTE — Telephone Encounter (Signed)
Would like diflucan called in. The OTC products do not work. [facility] [address]

## 2015-09-24 ENCOUNTER — Ambulatory Visit (INDEPENDENT_AMBULATORY_CARE_PROVIDER_SITE_OTHER): Payer: Self-pay | Admitting: Family Medicine

## 2015-09-24 DIAGNOSIS — K529 Noninfective gastroenteritis and colitis, unspecified: Secondary | ICD-10-CM | POA: Insufficient documentation

## 2015-09-24 MED ORDER — FLUCONAZOLE 150 MG PO TABS: 150.0000 mg | ORAL_TABLET | Freq: Once | ORAL | 1 refills | Status: AC

## 2015-09-24 NOTE — Patient Instructions (Signed)
Diarrhea  Treatment - you should: - Stay well-hydrated with water. - Avoid fatty foods and fried foods. - Do not overeat.  You should be better in: 3-5 Call us or go to the ER if you become dehydrated (no urination or lightheaded) or have severe abdominal pain or bleeding. Come back to see us as needed.

## 2015-09-24 NOTE — Progress Notes (Signed)
DIARRHEA Since Monday. Mostly loose. Some watery. ~3x a day. Started to improve yesterday. No diarrhea today. Vomiting last night -- unsure of cause but possible due to onset of menstrual cycle.   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 HEENT: NCAT, EOMI, PERRL CV: RRR, no murmur Resp: CTAB, no wheezes, non-labored Abd: SNTND, BS present, no guarding or organomegaly   Assessment and plan:  Gastroenteritis Patient is here with signs and symptoms consistent with gastroenteritis. Etiology likely viral in nature. Patient appears to be doing well today and is relatively asymptomatic at this time. - Encouraged high fluid intake with water. - Avoidance of fatty and fried food. - Offered anti-emetics but patient declined. - Return precautions discussed.   Kathee DeltonIan D Nohelani Benning, MD,MS,  PGY3 09/24/2015 12:48 PM

## 2015-09-24 NOTE — Assessment & Plan Note (Signed)
Patient is here with signs and symptoms consistent with gastroenteritis. Etiology likely viral in nature. Patient appears to be doing well today and is relatively asymptomatic at this time. - Encouraged high fluid intake with water. - Avoidance of fatty and fried food. - Offered anti-emetics but patient declined. - Return precautions discussed.

## 2015-09-24 NOTE — Telephone Encounter (Signed)
Patient seen 9/8 for STD check and given Rx for miconazole supp.  Patient stating this does not work, so will provide Diflucan.  Erasmo DownerAngela M Bacigalupo, MD, MPH PGY-3,  [facility] 09/24/2015 9:47 AM

## 2015-10-16 ENCOUNTER — Ambulatory Visit: Payer: Self-pay | Admitting: Family Medicine

## 2015-10-21 ENCOUNTER — Other Ambulatory Visit: Payer: Self-pay | Admitting: Family Medicine

## 2015-10-21 DIAGNOSIS — E282 Polycystic ovarian syndrome: Secondary | ICD-10-CM

## 2015-10-21 NOTE — Telephone Encounter (Signed)
Pt needs a refill on birth control. Pt has an appointment 10-27-15, but will be out before then. [facility] [address]. Please advise. Thanks! ep

## 2015-10-22 ENCOUNTER — Other Ambulatory Visit: Payer: Self-pay | Admitting: Family Medicine

## 2015-10-22 DIAGNOSIS — E282 Polycystic ovarian syndrome: Secondary | ICD-10-CM

## 2015-10-22 MED ORDER — NORGESTIMATE-ETH ESTRADIOL 0.25-35 MG-MCG PO TABS: 1.0000 | ORAL_TABLET | Freq: Every day | ORAL | 0 refills | Status: DC

## 2015-10-22 NOTE — Telephone Encounter (Signed)
Tried calling patient to inform, however, number in chart is no longer in service.

## 2015-10-22 NOTE — Telephone Encounter (Signed)
One refill sent to pharmacy.  Needs to keep 10/16 appt to get further refills.  Erasmo DownerAngela M Rush Salce, MD, MPH PGY-3,  [facility] 10/22/2015 8:20 AM

## 2015-10-23 ENCOUNTER — Telehealth: Payer: Self-pay | Admitting: Family Medicine

## 2015-10-27 ENCOUNTER — Ambulatory Visit (INDEPENDENT_AMBULATORY_CARE_PROVIDER_SITE_OTHER): Payer: Self-pay | Admitting: Family Medicine

## 2015-10-27 VITALS — BP 148/80 | HR 86 | Temp 98.8°F | Ht 69.0 in | Wt 262.4 lb

## 2015-10-27 DIAGNOSIS — I10 Essential (primary) hypertension: Secondary | ICD-10-CM

## 2015-10-27 DIAGNOSIS — L930 Discoid lupus erythematosus: Secondary | ICD-10-CM

## 2015-10-27 DIAGNOSIS — Z3041 Encounter for surveillance of contraceptive pills: Secondary | ICD-10-CM

## 2015-10-27 DIAGNOSIS — Z113 Encounter for screening for infections with a predominantly sexual mode of transmission: Secondary | ICD-10-CM

## 2015-10-27 DIAGNOSIS — N920 Excessive and frequent menstruation with regular cycle: Secondary | ICD-10-CM

## 2015-10-27 MED ORDER — AMLODIPINE BESYLATE 5 MG PO TABS: 5.0000 mg | ORAL_TABLET | Freq: Every day | ORAL | 11 refills | Status: DC

## 2015-10-27 MED ORDER — NAPROXEN 500 MG PO TABS: ORAL_TABLET | ORAL | 3 refills | Status: DC

## 2015-10-27 MED ORDER — NORETHINDRONE 0.35 MG PO TABS: 1.0000 | ORAL_TABLET | Freq: Every day | ORAL | 11 refills | Status: DC

## 2015-10-27 NOTE — Progress Notes (Signed)
Pt is self pay.  Advised that Flu shot would be cheaper at a pharmacy.  Pt is agreeable to plan. Chrissie Dacquisto, Maryjo RochesterJessica Dawn, CMA

## 2015-10-27 NOTE — Patient Instructions (Addendum)
Sent in new birth control pill for you ("minipill") Take at same time every day Use condoms for at least 1 week after switching to prevent pregnancy  Also refilled naproxen for you  Checking for HIV & syphilis Go to pharmacy to get your flu shot, cheaper there  For blood pressure - refilled medicine Follow up in 1 month to see how blood pressure is doing  Be well, Dr. Pollie Meyer     Contraception Choices Contraception (birth control) is the use of any methods or devices to prevent pregnancy. Below are some methods to help avoid pregnancy. HORMONAL METHODS   Contraceptive implant. This is a thin, plastic tube containing progesterone hormone. It does not contain estrogen hormone. Your health care provider inserts the tube in the inner part of the upper arm. The tube can remain in place for up to 3 years. After 3 years, the implant must be removed. The implant prevents the ovaries from releasing an egg (ovulation), thickens the cervical mucus to prevent sperm from entering the uterus, and thins the lining of the inside of the uterus.  Progesterone-only injections. These injections are given every 3 months by your health care provider to prevent pregnancy. This synthetic progesterone hormone stops the ovaries from releasing eggs. It also thickens cervical mucus and changes the uterine lining. This makes it harder for sperm to survive in the uterus.  Birth control pills. These pills contain estrogen and progesterone hormone. They work by preventing the ovaries from releasing eggs (ovulation). They also cause the cervical mucus to thicken, preventing the sperm from entering the uterus. Birth control pills are prescribed by a health care provider.Birth control pills can also be used to treat heavy periods.  Minipill. This type of birth control pill contains only the progesterone hormone. They are taken every day of each month and must be prescribed by your health care provider.  Birth control  patch. The patch contains hormones similar to those in birth control pills. It must be changed once a week and is prescribed by a health care provider.  Vaginal ring. The ring contains hormones similar to those in birth control pills. It is left in the vagina for 3 weeks, removed for 1 week, and then a new one is put back in place. The patient must be comfortable inserting and removing the ring from the vagina.A health care provider's prescription is necessary.  Emergency contraception. Emergency contraceptives prevent pregnancy after unprotected sexual intercourse. This pill can be taken right after sex or up to 5 days after unprotected sex. It is most effective the sooner you take the pills after having sexual intercourse. Most emergency contraceptive pills are available without a prescription. Check with your pharmacist. Do not use emergency contraception as your only form of birth control. BARRIER METHODS   Female condom. This is a thin sheath (latex or rubber) that is worn over the penis during sexual intercourse. It can be used with spermicide to increase effectiveness.  Female condom. This is a soft, loose-fitting sheath that is put into the vagina before sexual intercourse.  Diaphragm. This is a soft, latex, dome-shaped barrier that must be fitted by a health care provider. It is inserted into the vagina, along with a spermicidal jelly. It is inserted before intercourse. The diaphragm should be left in the vagina for 6 to 8 hours after intercourse.  Cervical cap. This is a round, soft, latex or plastic cup that fits over the cervix and must be fitted by a health care provider.  The cap can be left in place for up to 48 hours after intercourse.  Sponge. This is a soft, circular piece of polyurethane foam. The sponge has spermicide in it. It is inserted into the vagina after wetting it and before sexual intercourse.  Spermicides. These are chemicals that kill or block sperm from entering the  cervix and uterus. They come in the form of creams, jellies, suppositories, foam, or tablets. They do not require a prescription. They are inserted into the vagina with an applicator before having sexual intercourse. The process must be repeated every time you have sexual intercourse. INTRAUTERINE CONTRACEPTION  Intrauterine device (IUD). This is a T-shaped device that is put in a woman's uterus during a menstrual period to prevent pregnancy. There are 2 types:  Copper IUD. This type of IUD is wrapped in copper wire and is placed inside the uterus. Copper makes the uterus and fallopian tubes produce a fluid that kills sperm. It can stay in place for 10 years.  Hormone IUD. This type of IUD contains the hormone progestin (synthetic progesterone). The hormone thickens the cervical mucus and prevents sperm from entering the uterus, and it also thins the uterine lining to prevent implantation of a fertilized egg. The hormone can weaken or kill the sperm that get into the uterus. It can stay in place for 3-5 years, depending on which type of IUD is used. PERMANENT METHODS OF CONTRACEPTION  Female tubal ligation. This is when the woman's fallopian tubes are surgically sealed, tied, or blocked to prevent the egg from traveling to the uterus.  Hysteroscopic sterilization. This involves placing a small coil or insert into each fallopian tube. Your doctor uses a technique called hysteroscopy to do the procedure. The device causes scar tissue to form. This results in permanent blockage of the fallopian tubes, so the sperm cannot fertilize the egg. It takes about 3 months after the procedure for the tubes to become blocked. You must use another form of birth control for these 3 months.  Female sterilization. This is when the female has the tubes that carry sperm tied off (vasectomy).This blocks sperm from entering the vagina during sexual intercourse. After the procedure, the man can still ejaculate fluid  (semen). NATURAL PLANNING METHODS  Natural family planning. This is not having sexual intercourse or using a barrier method (condom, diaphragm, cervical cap) on days the woman could become pregnant.  Calendar method. This is keeping track of the length of each menstrual cycle and identifying when you are fertile.  Ovulation method. This is avoiding sexual intercourse during ovulation.  Symptothermal method. This is avoiding sexual intercourse during ovulation, using a thermometer and ovulation symptoms.  Post-ovulation method. This is timing sexual intercourse after you have ovulated. Regardless of which type or method of contraception you choose, it is important that you use condoms to protect against the transmission of sexually transmitted infections (STIs). Talk with your health care provider about which form of contraception is most appropriate for you.   This information is not intended to replace advice given to you by your health care provider. Make sure you discuss any questions you have with your health care provider.   Document Released: 12/28/2004 Document Revised: 01/02/2013 Document Reviewed: 06/22/2012 Elsevier Interactive Patient Education [employer]2016 Elsevier Inc.

## 2015-10-27 NOTE — Progress Notes (Signed)
Date of Visit: 10/27/2015   HPI:  Patient presents for refill of birth control pills and naproxen.  Has been on OCP's for 2 years. Likes her current pill. Gets periods monthly with pill. Is good at remembering to take it every day. Denies smoking, migraine with aura, or history of cardiac or liver disease. Gets cramps with periods and for this she takes naproxen. Needs refill. Works well for cramps. Prior to being on OCP's, periods were irregular. Has dx of PCOS on problem list.   Sexually active with two female partners in the last year. One current partner, with which she does not use condoms. Has been tested for STDs since becoming active with that partner (though did not get HIV, RPR).  Has access to condoms when she needs them.  Has history of hypertension. Previously prescribed amlodipine 5mg  daily for 30 days, only received this rx twice ever. Has not taken recently.  Also note history of lupus - discussed with patient today, she does not have a regular rheumatologist.  ROS: See HPI.  PMFSH: hx of HTN, iron deficiency anemia, PCOS, GERD, obesity, lupus, asthma, eczema, anemia  PHYSICAL EXAM: BP (!) 143/82 (BP Location: Left Arm, Patient Position: Sitting, Cuff Size: Normal)   Pulse 86   Temp 98.8 F (37.1 C) (Oral)   Ht 5\' 9"  (1.753 m)   Wt 262 lb 6.4 oz (119 kg)   SpO2 99%   BMI 38.75 kg/m  Gen: NAD, pleasant, cooperative HEENT: normocephalic, atraumatic, moist mucous membranes  Heart: regular rate and rhythm, no murmur Lungs: clear to auscultation bilaterally, normal work of breathing  Neuro: alert grossly nonfocal, speech normal Ext: No appreciable lower extremity edema bilaterally   ASSESSMENT/PLAN:  Health maintenance:  -flu shot advised - patient will get at pharmacy as it is cheaper there (no insurance) -STD screening - will complete screen with HIV & RPR drawn today (not done at recent visit) -given supply of condoms today  Essential hypertension Blood pressure  up initially to 143/82, then 148/80 on recheck. Uncontrolled, not on medications. Rx amlodipine 5mg  daily. Noted after visit that last assessment of renal function was June 2016 (normal renal function, but elevated glucose) Plan for CMET, lipids, A1c at next office visit - will discuss with patient by phone. Follow up in 1 month with PCP.   Contraception management -Current dx of hypertension (confirmed with BP recheck today, and historically elevated blood pressures) suggests that combined OCPs are NOT the best regimen for her due to risk of CV events with estrogen (risks outweigh benefits per CDC contraceptive medical eligibility guidelines). -Discussed with patient in detail today. Reviewed options for progesterone only contraception, including depo, LARCs (nexplanon, mirena), and norethindrone. -After discussion, patient prefers to stick with pill. Rx sent in for norethindrone. Counseled on importance of taking at the same time each day. -With history of PCOS - stressed to patient importance of having regular periods in protecting her endometrium from long term risk of malignancy. -Follow up with PCP in 1 month to see how things are going.  Lupus History of lupus noted, and endorsed by patient. Not on any rheumatologic medications at present, not followed by rheumatology. Recommend attention to this at follow up with PCP - may need to get her in with rheum.  FOLLOW UP: Follow up in 1 month with PCP for above issues.  GrenadaBrittany J. Pollie MeyerMcIntyre, MD [facility]

## 2015-10-28 LAB — HIV ANTIBODY (ROUTINE TESTING W REFLEX): HIV 1&2 Ab, 4th Generation: NONREACTIVE

## 2015-10-28 LAB — RPR

## 2015-10-30 ENCOUNTER — Encounter: Payer: Self-pay | Admitting: Family Medicine

## 2015-10-30 ENCOUNTER — Telehealth: Payer: Self-pay | Admitting: Family Medicine

## 2015-10-30 NOTE — Assessment & Plan Note (Signed)
History of lupus noted, and endorsed by patient. Not on any rheumatologic medications at present, not followed by rheumatology. Recommend attention to this at follow up with PCP - may need to get her in with rheum.

## 2015-10-30 NOTE — Assessment & Plan Note (Signed)
Blood pressure up initially to 143/82, then 148/80 on recheck. Uncontrolled, not on medications. Rx amlodipine 5mg  daily. Noted after visit that last assessment of renal function was June 2016 (normal renal function, but elevated glucose) Plan for CMET, lipids, A1c at next office visit - will discuss with patient by phone. Follow up in 1 month with PCP.

## 2015-10-30 NOTE — Telephone Encounter (Signed)
Attempted to reach patient to inform of negative HIV & RPR results. Phone number not in service. Will send letter asking her to call us with her updated phone #.  Note also plan to inform patient that she needs CMET, lipids, A1c checked.   Latrelle DodrillBrittany J McIntyre, MD

## 2015-10-30 NOTE — Assessment & Plan Note (Addendum)
-  Current dx of hypertension (confirmed with BP recheck today, and historically elevated blood pressures) suggests that combined OCPs are NOT the best regimen for her due to risk of CV events with estrogen (risks outweigh benefits per [facility] contraceptive medical eligibility guidelines). -Discussed with patient in detail today. Reviewed options for progesterone only contraception, including depo, LARCs (nexplanon, mirena), and norethindrone. -After discussion, patient prefers to stick with pill. Rx sent in for norethindrone. Counseled on importance of taking at the same time each day. -With history of PCOS - stressed to patient importance of having regular periods in protecting her endometrium from long term risk of malignancy. -Follow up with PCP in 1 month to see how things are going.

## 2015-11-07 ENCOUNTER — Ambulatory Visit (INDEPENDENT_AMBULATORY_CARE_PROVIDER_SITE_OTHER): Payer: Self-pay | Admitting: Internal Medicine

## 2015-11-07 ENCOUNTER — Encounter: Payer: Self-pay | Admitting: Internal Medicine

## 2015-11-07 DIAGNOSIS — A09 Infectious gastroenteritis and colitis, unspecified: Secondary | ICD-10-CM

## 2015-11-07 NOTE — Progress Notes (Deleted)
   [facility] Ebone Alcivar, MD Phone: [phone]  Reason For Visit:   # *** -   Past Medical History Reviewed problem list.  Medications- reviewed and updated No additions to family history Social history- patient is a *** smoker  Objective: There were no vitals taken for this visit. Gen: NAD, alert, cooperative with exam HEENT: Normal    Neck: No masses palpated. No lymphadenopathy    Ears: Tympanic membranes intact, normal light reflex, no erythema, no bulging    Eyes: PERRLA, EOMI    Nose: nasal turbinates moist    Throat: moist mucus membranes, no erythema Cardio: regular rate and rhythm, S1S2 heard, no murmurs appreciated Pulm: clear to auscultation bilaterally, no wheezes, rhonchi or rales GI: soft, non-tender, non-distended, bowel sounds present, no hepatomegaly, no splenomegaly GU: external vaginal tissue ***, cervix ***, *** punctate lesions on cervix appreciated, *** discharge from cervical os, *** bleeding, *** cervical motion tenderness, *** abdominal/ adnexal masses Extremities: warm, well perfused, No edema, cyanosis or clubbing;  MSK: Normal gait and station Skin: dry, intact, no rashes or lesions Neuro: Strength and sensation grossly intact   Assessment/Plan: See problem based a/p  

## 2015-11-07 NOTE — Patient Instructions (Signed)

## 2015-11-07 NOTE — Progress Notes (Signed)
   [facility] Noralee CharsAsiyah Presly Steinruck, MD Phone: [phone]  Reason For Visit: Diarrhea and Nausea  - Less than 24 hours of Diarrhea and Nausea  - Ate [employer] and spaghetti - 4/5 bowel movement - soft stools  - No fevers or chills - Nausea, though no vomiting  - No abdominal pain  - Nobody else is sick  - No bloody stools  Past Medical History Reviewed problem list.  Medications- reviewed and updated No additions to family history Social history- patient is a non smoker  Objective: BP (!) 143/69   Pulse 93   Temp 99.1 F (37.3 C) (Oral)   Ht 5\' 9"  (1.753 m)   Wt 258 lb 9.6 oz (117.3 kg)   LMP 10/16/2015 (Approximate)   SpO2 100%   BMI 38.19 kg/m  Gen: NAD, alert, cooperative with exam Cardio: regular rate and rhythm, S1S2 heard, no murmurs appreciated GI: soft, non-tender, non-distended, bowel sounds present, no hepatomegaly, no splenomegaly Skin: dry, intact, no rashes or lesions  Assessment/Plan: See problem based a/p  Diarrhea Well appearing female with mild gastroenteritis possibly from [employer]. At visit, nausea had mostly resolved, no abdominal pain. Patient denied wanting any medication for symptomatic treatment. Provided work note. Provided return precautions

## 2015-11-10 DIAGNOSIS — R197 Diarrhea, unspecified: Secondary | ICD-10-CM | POA: Insufficient documentation

## 2015-11-10 NOTE — Assessment & Plan Note (Signed)
Well appearing female with mild gastroenteritis possibly from [facility]. At visit, nausea had mostly resolved, no abdominal pain. Patient denied wanting any medication for symptomatic treatment. Provided work note. Provided return precautions

## 2015-11-27 ENCOUNTER — Ambulatory Visit: Payer: Self-pay | Admitting: Family Medicine

## 2015-12-03 ENCOUNTER — Telehealth: Payer: Self-pay | Admitting: Family Medicine

## 2015-12-03 ENCOUNTER — Other Ambulatory Visit: Payer: Self-pay | Admitting: Family Medicine

## 2015-12-03 MED ORDER — METRONIDAZOLE 500 MG PO TABS
500.0000 mg | ORAL_TABLET | Freq: Two times a day (BID) | ORAL | 0 refills | Status: DC
Start: 2015-12-03 — End: 2015-12-18

## 2015-12-03 NOTE — Telephone Encounter (Signed)
Pt informed. Nakoma Gotwalt, CMA  

## 2015-12-03 NOTE — Telephone Encounter (Signed)
Last wet prep with signs of BV, not yeast infection.  She was not treated for BV, though.  It is likely that the miconazole is not helping for the discharge, because it is not caused by yeast.  Will not send in another refill for Diflucan, but will treat BV with Flagyl.  Please let patient know.  Also advise her not to drink any alcohol while taking Flagyl.  Erasmo DownerAngela M Bacigalupo, MD, MPH PGY-3,  [facility] 12/03/2015 3:36 PM

## 2015-12-03 NOTE — Telephone Encounter (Signed)
Needs the oral medication for a yeast infection called [facility]. [facility] [address].  The other kind of medicine doesn't work

## 2015-12-12 ENCOUNTER — Ambulatory Visit (INDEPENDENT_AMBULATORY_CARE_PROVIDER_SITE_OTHER): Payer: Self-pay | Admitting: Family Medicine

## 2015-12-12 ENCOUNTER — Encounter: Payer: Self-pay | Admitting: Family Medicine

## 2015-12-12 VITALS — BP 140/62 | HR 105 | Temp 98.6°F | Ht 69.0 in | Wt 265.8 lb

## 2015-12-12 DIAGNOSIS — Z3041 Encounter for surveillance of contraceptive pills: Secondary | ICD-10-CM

## 2015-12-12 DIAGNOSIS — E669 Obesity, unspecified: Secondary | ICD-10-CM

## 2015-12-12 DIAGNOSIS — I10 Essential (primary) hypertension: Secondary | ICD-10-CM

## 2015-12-12 LAB — COMPLETE METABOLIC PANEL WITH GFR
ALT: 21 U/L (ref 6–29)
AST: 20 U/L (ref 10–30)
Albumin: 3.8 g/dL (ref 3.6–5.1)
Alkaline Phosphatase: 47 U/L (ref 33–115)
BUN: 7 mg/dL (ref 7–25)
CO2: 28 mmol/L (ref 20–31)
Calcium: 9.6 mg/dL (ref 8.6–10.2)
Chloride: 105 mmol/L (ref 98–110)
Creat: 0.86 mg/dL (ref 0.50–1.10)
GFR, Est African American: >89 mL/min (ref >=60)
GFR, Est Non African American: >89 mL/min (ref >=60)
Glucose, Bld: 99 mg/dL (ref 65–99)
Potassium: 4.5 mmol/L (ref 3.5–5.3)
Sodium: 139 mmol/L (ref 135–146)
Total Bilirubin: 0.3 mg/dL (ref 0.2–1.2)
Total Protein: 6.6 g/dL (ref 6.1–8.1)

## 2015-12-12 LAB — LIPID PANEL
Cholesterol: 135 mg/dL (ref <200)
HDL: 36 mg/dL — ABNORMAL LOW (ref >50)
LDL Cholesterol: 67 mg/dL (ref <100)
Total CHOL/HDL Ratio: 3.8 Ratio (ref <5.0)
Triglycerides: 158 mg/dL — ABNORMAL HIGH (ref <150)
VLDL: 32 mg/dL — ABNORMAL HIGH (ref <30)

## 2015-12-12 LAB — POCT GLYCOSYLATED HEMOGLOBIN (HGB A1C): Hemoglobin A1C: 5.4

## 2015-12-12 NOTE — Assessment & Plan Note (Signed)
Uncontrolled today Has never started her medication Start amlodipine 5 mg daily Discussed potential side effect of lower extremity edema with patient Check CMP, lipids, A1c today Follow-up in 2 weeks Can increase amlodipine to 10 mg daily at that time if blood pressure remains uncontrolled

## 2015-12-12 NOTE — Assessment & Plan Note (Signed)
Counseled patient extensively on contraceptive options Given current diagnoses of hypertension and elevated blood pressures today, combined OCPs are not a feasible option as the risks outweigh the benefits first CV events with estrogen Reviewed options for other forms a progesterone only contraception including LARCs, Depo-Provera After discussion, patient prefers to have time to think about other options and stay on POPs at this time Follow-up with patient at next appointment for decision about possibly changing contraception

## 2015-12-12 NOTE — Assessment & Plan Note (Signed)
-  Counseled on diet and exercise.

## 2015-12-12 NOTE — Progress Notes (Signed)
   Subjective:   Diane Lloyd is a 25 y.o. female with a history of HTN, PCO S, depression here for contraception  Contraception Patient was seen on 10/30/15 and switch from OCPs to progesterone only pills for contraception She should not take combined OCPs even her risk of cardiovascular events with estrogen Wants to switch back to OCPs Having intermittent bleeding and spotting and weight gain since switching to POP Not easy to take it at the same time every day  HTN: - Medications: was prescribed amlodipine 5mg  at last visit - never started it - nervous about starting it - Checking BP at home: no - Denies any SOB, CP, vision changes, LE edema, medication SEs, or symptoms of hypotension - Diet: eats a lot of fast food, drinks a lot of caffeine - Exercise: none   Review of Systems:  Per HPI.   Social History: never smoker  Objective:  BP 140/62   Pulse (!) 105   Temp 98.6 F (37 C) (Oral)   Ht 5\' 9"  (1.753 m)   Wt 265 lb 12.8 oz (120.6 kg)   BMI 39.25 kg/m   Gen:  25 y.o. female in NAD HEENT: NCAT, MMM, EOMI, PERRL, anicteric sclerae CV: RRR, no MRG Resp: Non-labored, CTAB, no wheezes noted Abd: Soft, NTND, BS present, no guarding or organomegaly Ext: WWP, no edema MSK:No obvious deformities, gait intact Neuro: Alert and oriented, speech normal       Chemistry      Component Value Date/Time   NA 139 06/19/2014 1544   K 3.9 06/19/2014 1544   CL 107 06/19/2014 1544   CO2 23 06/19/2014 1544   BUN 7 06/19/2014 1544   CREATININE 0.83 06/19/2014 1544   CREATININE 0.74 12/30/2010 1040      Component Value Date/Time   CALCIUM 9.0 06/19/2014 1544   ALKPHOS 45 06/19/2014 1544   AST 17 06/19/2014 1544   ALT 18 06/19/2014 1544   BILITOT 0.4 06/19/2014 1544      Lab Results  Component Value Date   WBC 5.3 08/29/2015   HGB 11.3 (L) 08/29/2015   HCT 35.0 08/29/2015   MCV 80.3 08/29/2015   PLT 329 08/29/2015   Lab Results  Component Value Date   TSH 1.293  09/11/2014   No results found for: HGBA1C Assessment & Plan:     Diane Lloyd is a 25 y.o. female here for   Essential hypertension Uncontrolled today Has never started her medication Start amlodipine 5 mg daily Discussed potential side effect of lower extremity edema with patient Check CMP, lipids, A1c today Follow-up in 2 weeks Can increase amlodipine to 10 mg daily at that time if blood pressure remains uncontrolled  Obesity Counseled on diet and exercise  Contraception management Counseled patient extensively on contraceptive options Given current diagnoses of hypertension and elevated blood pressures today, combined OCPs are not a feasible option as the risks outweigh the benefits first CV events with estrogen Reviewed options for other forms a progesterone only contraception including LARCs, Depo-Provera After discussion, patient prefers to have time to think about other options and stay on POPs at this time Follow-up with patient at next appointment for decision about possibly changing contraception     Erasmo DownerAngela M Bacigalupo, MD MPH PGY-3,  [facility] 12/12/2015  2:55 PM

## 2015-12-12 NOTE — Patient Instructions (Signed)
Nice to meet you today. Your blood pressure is elevated. Start taking amlodipine daily for your blood pressure. We are getting some labs today and someone will call you or send you a letter with the results when they're available.  Continue taking your current birth control and think about the other options that we have discussed. Estrogen containing pills are not a good option for you as they increase your risk of stroke since you have high blood pressure.  Come back to see me in 2 weeks to follow-up on your blood pressure.  Take care, Dr. BLeonard Schwartz

## 2015-12-15 ENCOUNTER — Encounter: Payer: Self-pay | Admitting: Family Medicine

## 2015-12-16 ENCOUNTER — Other Ambulatory Visit: Payer: Self-pay

## 2015-12-16 ENCOUNTER — Emergency Department (HOSPITAL_COMMUNITY): Payer: Self-pay

## 2015-12-16 ENCOUNTER — Encounter (HOSPITAL_COMMUNITY): Payer: Self-pay | Admitting: Emergency Medicine

## 2015-12-16 ENCOUNTER — Emergency Department (HOSPITAL_COMMUNITY)
Admission: EM | Admit: 2015-12-16 | Discharge: 2015-12-16 | Disposition: A | Discharge status: Home / Self Care | Payer: Self-pay | Attending: Emergency Medicine | Admitting: Emergency Medicine

## 2015-12-16 DIAGNOSIS — J45909 Unspecified asthma, uncomplicated: Secondary | ICD-10-CM | POA: Insufficient documentation

## 2015-12-16 DIAGNOSIS — I10 Essential (primary) hypertension: Secondary | ICD-10-CM | POA: Insufficient documentation

## 2015-12-16 DIAGNOSIS — Z79899 Other long term (current) drug therapy: Secondary | ICD-10-CM | POA: Insufficient documentation

## 2015-12-16 DIAGNOSIS — N920 Excessive and frequent menstruation with regular cycle: Secondary | ICD-10-CM

## 2015-12-16 DIAGNOSIS — R0789 Other chest pain: Secondary | ICD-10-CM | POA: Insufficient documentation

## 2015-12-16 LAB — CBC
HCT: 34.9 % — ABNORMAL LOW (ref 36.0–46.0)
Hemoglobin: 11.1 g/dL — ABNORMAL LOW (ref 12.0–15.0)
MCH: 25.6 pg — ABNORMAL LOW (ref 26.0–34.0)
MCHC: 31.8 g/dL (ref 30.0–36.0)
MCV: 80.4 fL (ref 78.0–100.0)
Platelets: 348 10*3/uL (ref 150–400)
RBC: 4.34 MIL/uL (ref 3.87–5.11)
RDW: 15.5 % (ref 11.5–15.5)
WBC: 7.8 10*3/uL (ref 4.0–10.5)

## 2015-12-16 LAB — BASIC METABOLIC PANEL
Anion gap: 9 (ref 5–15)
BUN: 9 mg/dL (ref 6–20)
CO2: 24 mmol/L (ref 22–32)
Calcium: 9.1 mg/dL (ref 8.9–10.3)
Chloride: 106 mmol/L (ref 101–111)
Creatinine, Ser: 0.77 mg/dL (ref 0.44–1.00)
GFR calc Af Amer: >60 mL/min (ref >60)
GFR calc non Af Amer: >60 mL/min (ref >60)
Glucose, Bld: 132 mg/dL — ABNORMAL HIGH (ref 65–99)
Potassium: 3.4 mmol/L — ABNORMAL LOW (ref 3.5–5.1)
Sodium: 139 mmol/L (ref 135–145)

## 2015-12-16 LAB — I-STAT TROPONIN, ED: Troponin i, poc: 0 ng/mL (ref 0.00–0.08)

## 2015-12-16 LAB — D-DIMER, QUANTITATIVE: D-Dimer, Quant: <0.27 ug/mL-FEU (ref 0.00–0.50)

## 2015-12-16 LAB — I-STAT CG4 LACTIC ACID, ED: Lactic Acid, Venous: 1.54 mmol/L (ref 0.5–1.9)

## 2015-12-16 MED ORDER — NAPROXEN 500 MG PO TABS: 500.0000 mg | ORAL_TABLET | Freq: Once | ORAL | Status: DC

## 2015-12-16 MED ORDER — NAPROXEN 500 MG PO TABS: ORAL_TABLET | ORAL | 0 refills | Status: DC

## 2015-12-16 NOTE — ED Triage Notes (Signed)
Pt states she started having pain in her right chest about 20 minutes ago  Pt describes the pain in between a sharpness and a dull ache  Pt denies any other sxs  Pt she recently has been diagnosed with hypertension but has not started her medication yet

## 2015-12-16 NOTE — ED Notes (Signed)
Patient transported to X-ray 

## 2015-12-16 NOTE — Discharge Instructions (Signed)
Your workup today was reassuring. We advised you take naproxen for pain. Follow-up with your primary care doctor. Start your blood pressure medication. You may return for new or concerning symptoms.

## 2015-12-16 NOTE — ED Provider Notes (Signed)
[facility]-EMERGENCY DEPT Provider Note   CSN: [id number] Arrival date & time: 12/16/15  0049    History   Chief Complaint Chief Complaint  Patient presents with  . Chest Pain    HPI Diane Lloyd is a 25 y.o. female.  25 year old female presents to the emergency department for complaints of chest pain. She states the pain has been intermittent over the past week, but worse over the past 24 hours. Patient has not taken any medications for her symptoms. No reported trauma and patient denies worsening pain with deep breathing or movement of her arms. She states that pain is sharp and that it is nonradiating. No history of similar symptoms. Patient does state that she should be taking blood pressure medication, but she has not started this seeing her primary care doctor. No personal or family history of DVT or PE. No personal history of ACS, DM, or dyslipidemia.   The history is provided by the patient. No language interpreter was used.  Chest Pain      Past Medical History:  Diagnosis Date  . Anemia    iron def  . Asthma   . Lupus    + ANA in 2012  . Obesity, unspecified   . PCOS (polycystic ovarian syndrome)     Patient Active Problem List   Diagnosis Date Noted  . Diarrhea 11/10/2015  . Gastroenteritis 09/24/2015  . Screen for STD (sexually transmitted disease) 09/21/2015  . Rash and nonspecific skin eruption 08/31/2015  . Dysmenorrhea 08/31/2015  . Allergic rhinitis 05/17/2013  . Lupus   . GERD (gastroesophageal reflux disease) 03/20/2012  . Polycystic ovarian syndrome 12/26/2011  . Family history of cardiac disorder 02/17/2011  . Contraception management 12/30/2010  . 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 Essential hypertension 01/27/2010  . ANEMIA, IRON DEFICIENCY 06/27/2009  . Obesity 03/10/2006  . ASTHMA, INTERMITTENT 03/10/2006  . ECZEMA, ATOPIC DERMATITIS 03/10/2006    Past Surgical History:  Procedure Laterality Date  .  TONSILLECTOMY      OB History    Gravida Para Term Preterm AB Living   0             SAB TAB Ectopic Multiple Live Births                   Home Medications    Prior to Admission medications   Medication Sig Start Date End Date Taking? Authorizing Provider  naproxen sodium (ANAPROX) 220 MG tablet Take 440 mg by mouth 2 (two) times daily as needed (pain).   Yes Historical Provider, MD  norethindrone (MICRONOR,CAMILA,ERRIN) 0.35 MG tablet Take 1 tablet (0.35 mg total) by mouth daily. 10/27/15  Yes Latrelle DodrillBrittany J McIntyre, MD  amLODipine (NORVASC) 5 MG tablet Take 1 tablet (5 mg total) by mouth daily. 10/27/15   Latrelle DodrillBrittany J McIntyre, MD  metroNIDAZOLE (FLAGYL) 500 MG tablet Take 1 tablet (500 mg total) by mouth 2 (two) times daily. Patient not taking: Reported on 12/16/2015 12/03/15   Erasmo DownerAngela M Bacigalupo, MD  naproxen (NAPROSYN) 500 MG tablet Take 500 mg every 12 hours for mild-moderate pain 12/16/15   Antony MaduraKelly Marlie Kuennen, PA-C    Family History Family History  Problem Relation Age of Onset  . Cancer Father 2852    esophagus  . Asthma Father   . Hypertension Mother   . Lupus Mother   . Diabetes Mother   . Cancer Maternal Uncle 40    pancreatic cancer  . Cancer Paternal Grandmother [id number]  esophageal    Social History Social History  Substance Use Topics  . Smoking status: Never Smoker  . Smokeless tobacco: Never Used  . Alcohol use Yes     Comment: occasional     Allergies   Patient has no known allergies.   Review of Systems Review of Systems  Cardiovascular: Positive for chest pain.  Ten systems reviewed and are negative for acute change, except as noted in the HPI.    Physical Exam Updated Vital Signs BP 129/87   Pulse 95   Temp 98.3 F (36.8 C) (Oral)   Resp 16   Ht 5\' 9"  (1.753 m)   Wt 120.2 kg   LMP 11/26/2015 (Approximate)   SpO2 97%   BMI 39.13 kg/m   Physical Exam  Constitutional: She is oriented to person, place, and time. She appears well-developed and  well-nourished. No distress.  Nontoxic and in NAD  HENT:  Head: Normocephalic and atraumatic.  Eyes: Conjunctivae and EOM are normal. No scleral icterus.  Neck: Normal range of motion.  Cardiovascular: Normal rate, regular rhythm and intact distal pulses.   Borderline tachycardia. Patient reports feeling anxious.  Pulmonary/Chest: Effort normal. No respiratory distress. She has no wheezes. She has no rales. She exhibits tenderness.    Lungs CTAB. Chest expansion symmetric.  Musculoskeletal: Normal range of motion.  Neurological: She is alert and oriented to person, place, and time. She exhibits normal muscle tone. Coordination normal.  Skin: Skin is warm and dry. No rash noted. She is not diaphoretic. No erythema. No pallor.  Psychiatric: She has a normal mood and affect. Her behavior is normal.  Nursing note and vitals reviewed.    ED Treatments / Results  Labs (all labs ordered are listed, but only abnormal results are displayed) Labs Reviewed  BASIC METABOLIC PANEL - Abnormal; Notable for the following:       Result Value   Potassium 3.4 (*)    Glucose, Bld 132 (*)    All other components within normal limits  CBC - Abnormal; Notable for the following:    Hemoglobin 11.1 (*)    HCT 34.9 (*)    MCH 25.6 (*)    All other components within normal limits  D-DIMER, QUANTITATIVE (NOT AT [facility])  I-STAT TROPOININ, ED  I-STAT BETA HCG BLOOD, ED ([facility], [facility], [facility] ONLY)  I-STAT CG4 LACTIC ACID, ED    EKG  EKG Interpretation  Date/Time:  Tuesday December 16 2015 00:59:47 EST Ventricular Rate:  129 PR Interval:    QRS Duration: 99 QT Interval:  321 QTC Calculation: 471 R Axis:   56 Text Interpretation:  Sinus tachycardia Confirmed by [facility]  MD, APRIL ([id number]) on 12/16/2015 1:27:18 AM       Radiology Dg Chest 2 View  Result Date: 12/16/2015 CLINICAL DATA:  Acute onset RIGHT anterior chest pain this morning, shortness of breath. Recently diagnosed with hypertension. EXAM:  CHEST  2 VIEW COMPARISON:  Chest radiograph November 27, 2012 FINDINGS: Cardiomediastinal silhouette is normal. No pleural effusions or focal consolidations. Trachea projects midline and there is no pneumothorax. Soft tissue planes and included osseous structures are non-suspicious. Large body habitus. IMPRESSION: Normal chest. Electronically Signed   By: Awilda Metro M.D.   On: 12/16/2015 01:43    Procedures Procedures (including critical care time)  Medications Ordered in ED Medications - No data to display   Initial Impression / Assessment and Plan / ED Course  I have reviewed the triage vital signs and the nursing notes.  Pertinent labs & imaging results that were available during my care of the patient were reviewed by me and considered in my medical decision making (see chart for details).  Clinical Course     25 year old female presents to the emergency department for chest pain. This is reproducible on palpation during exam. Suspect musculoskeletal etiology. Laboratory workup is reassuring and EKG is nonischemic. Heart score is 0. Chest x-ray shows no acute cardiopulmonary findings. Plan to manage supportively with NSAIDs. The patient has been referred to her primary care doctor for follow-up. Return precautions discussed and provided. Patient discharged in stable condition with no unaddressed concerns.   Final Clinical Impressions(s) / ED Diagnoses   Final diagnoses:  Chest wall pain  Hypertension not at goal    New Prescriptions Discharge Medication List as of 12/16/2015  3:17 AM       Antony MaduraKelly Flossie Wexler, PA-C 12/16/15 0654    April Palumbo, MD 12/16/15 [phone]

## 2015-12-18 ENCOUNTER — Encounter: Payer: Self-pay | Admitting: Family Medicine

## 2015-12-18 ENCOUNTER — Ambulatory Visit (INDEPENDENT_AMBULATORY_CARE_PROVIDER_SITE_OTHER): Payer: Self-pay | Admitting: Family Medicine

## 2015-12-18 ENCOUNTER — Ambulatory Visit: Payer: Self-pay | Admitting: Family Medicine

## 2015-12-18 VITALS — BP 158/78 | HR 77 | Temp 98.5°F | Ht 69.0 in | Wt 264.8 lb

## 2015-12-18 DIAGNOSIS — B3731 Acute candidiasis of vulva and vagina: Secondary | ICD-10-CM | POA: Insufficient documentation

## 2015-12-18 DIAGNOSIS — B373 Candidiasis of vulva and vagina: Secondary | ICD-10-CM

## 2015-12-18 DIAGNOSIS — N898 Other specified noninflammatory disorders of vagina: Secondary | ICD-10-CM

## 2015-12-18 LAB — POCT WET PREP (WET MOUNT)
Clue Cells Wet Prep Whiff POC: POSITIVE
Trichomonas Wet Prep HPF POC: ABSENT

## 2015-12-18 MED ORDER — FLUCONAZOLE 150 MG PO TABS
150.0000 mg | ORAL_TABLET | Freq: Once | ORAL | 1 refills | Status: AC
Start: 2015-12-18 — End: 2015-12-18

## 2015-12-18 MED ORDER — METRONIDAZOLE 500 MG PO TABS: 500.0000 mg | ORAL_TABLET | Freq: Two times a day (BID) | ORAL | 0 refills | Status: DC

## 2015-12-18 MED ORDER — FLUCONAZOLE 150 MG PO TABS: 150.0000 mg | ORAL_TABLET | Freq: Once | ORAL | 0 refills | Status: DC

## 2015-12-18 NOTE — Assessment & Plan Note (Addendum)
Patient is here with signs and symptoms consistent with vaginal candidiasis. Wet prep was positive for yeast and clue cells. Patient was recently placed on metronidazole for BV 1.5 months ago. - Diflucan 150 mg once - Metronidazole twice a day 7 days - Return precautions and vaginal hygiene handout provided.

## 2015-12-18 NOTE — Patient Instructions (Signed)
Healthy vaginal hygiene practices   -  Avoid sleeper pajamas. Nightgowns allow air to circulate.  Sleep without underpants whenever possible.  -  Wear cotton underpants during the day. Double-rinse underwear after washing to avoid residual detergent. Do not use fabric softeners for underwear and swimsuits.  - Avoid tights, leotards, leggings, "skinny" jeans, and other tight-fitting clothing. Skirts and loose-fitting pants allow air to circulate.  - Avoid pantyliners.  Instead use tampons or cotton pads.  - Daily warm bathing is helpful:     - Soak in clean water (no soap) for 10 to 15 minutes. Adding vinegar or baking soda to the water has not been specifically studied and may not be better than clean water alone.      - Use soap to wash regions other than the genital area just before getting out of the tub. Limit use of any soap on genital areas. Use fragance-free soaps.     - Rinse the genital area well and gently pat dry.  Don't rub.  Hair dryer to assist with drying can be used only if on cool setting.     - Do not use bubble baths or perfumed soaps.  - Do not use any feminine sprays, douches or powders.  These contain chemicals that will irritate the skin.  - If the genital area is tender or swollen, cool compresses may relieve the discomfort. Unscented wet wipes can be used instead of toilet paper for wiping.   - Emollients, such as Vaseline, may help protect skin and can be applied to the irritated area.  - Always remember to wipe front-to-back after bowel movements. Pat dry after urination.  - Do not sit in wet swimsuits for long periods of time after swimming   

## 2015-12-18 NOTE — Progress Notes (Signed)
VAGINAL DISCHARGE  Having vaginal discharge for 4-5 days. Discharge is: white, thick, clumps Sex in last month: yes; 1 new partner Using barrier protection (condoms): no Possible STD exposure: yes Personal history of vaginal infection: yes; none w/in the past 6 months Family history of uterine or vaginal cancer: no Recent antibiotic use: yes; metronidazole 1.5 months ago  Symptoms Vaginal itching: yes Dysuria: minimal Dyspareunia: yes Genital sores or ulcers: no Hematuria: no Flank pain: no Weight loss: no Weight gain: no Trouble with vision: no Headaches: no Abdomen or pelvic pain: no Back pain: no   ROS see HPI Smoking Status noted  CC, SH/smoking status, and VS noted  Objective: BP (!) 158/78   Pulse 77   Temp 98.5 F (36.9 C) (Oral)   Ht 5\' 9"  (1.753 m)   Wt 264 lb 12.8 oz (120.1 kg)   LMP 11/26/2015 (Approximate)   BMI 39.10 kg/m  Gen: NAD, alert, cooperative. CV: Well-perfused. Resp: Non-labored. Neuro: Sensation intact throughout. Female genitalia: Vulva: normal appearing vulva with no masses, tenderness or lesions Vagina: vaginal erythema throughout, vaginal tenderness throughout and Profuse thick, curd-like, white discharge present   Assessment and plan:  Vaginal candidiasis Patient is here with signs and symptoms consistent with vaginal candidiasis. Wet prep was positive for yeast and clue cells. Patient was recently placed on metronidazole for BV 1.5 months ago. - Diflucan 150 mg once - Metronidazole twice a day 7 days - Return precautions and vaginal hygiene handout provided.   Orders Placed This Encounter  Procedures  . POCT Wet Prep [facility] [place])    Meds ordered this encounter  Medications  . DISCONTD: fluconazole (DIFLUCAN) 150 MG tablet    Sig: Take 1 tablet (150 mg total) by mouth once.    Dispense:  1 tablet    Refill:  0  . metroNIDAZOLE (FLAGYL) 500 MG tablet    Sig: Take 1 tablet (500 mg total) by mouth 2 (two) times daily.   Dispense:  14 tablet    Refill:  0  . fluconazole (DIFLUCAN) 150 MG tablet    Sig: Take 1 tablet (150 mg total) by mouth once.    Dispense:  1 tablet    Refill:  1     Kathee DeltonIan D McKeag, MD,MS,  PGY3 12/18/2015 5:23 PM

## 2015-12-19 ENCOUNTER — Telehealth: Payer: Self-pay | Admitting: Internal Medicine

## 2015-12-19 NOTE — Telephone Encounter (Signed)
[facility] After [facility] Tele[facility]   Number received via page: [phone] Person calling: Luciano CutterDeneshia Fulmore Reason for call: Patient received metronidazole 1 day ago for bacterial vaginosis. She now believes she has rash from metronadiazole. She explains she has two small lesions on her face, similar to acne breakout, however they are itchy. Patient has been on metronidazole several times and has never had rashes in the past. She called the clinic earlier today and states was told to try steroid cream and benadryl as needed.  - No mouth ulcers  - No fever no chills - No abdominal pain  - No SOB - No nausea/vomiting   Explained to patient that I do not believe the facial lesions are from the metronidazole as patient has used this medication many times in the past without reaction. Indicated that she can try benadryl and topical steroid cream as needed. If she does not improve over the next two days, she can always stop the metronidazole and come in for evaluation on Monday.    Noralee CharsAsiyah Saraphina Lauderbaugh PGY-2 [facility]

## 2015-12-24 ENCOUNTER — Other Ambulatory Visit: Payer: Self-pay | Admitting: Family Medicine

## 2015-12-24 MED ORDER — FLUCONAZOLE 150 MG PO TABS: 150.0000 mg | ORAL_TABLET | Freq: Once | ORAL | 0 refills | Status: AC

## 2015-12-24 NOTE — Telephone Encounter (Signed)
   Prescription for fluconazole sent to pharmacy  Erasmo DownerAngela M Bacigalupo, MD, MPH PGY-3,  [facility] 12/24/2015 1:56 PM

## 2015-12-24 NOTE — Telephone Encounter (Signed)
Pt saw Dr. Wende MottMcKeag last week and was given antibiotics and a pill for yeast infection. Pt misunderstood instructions and took the diflucan when she started the antibiotics and now pt doesn't have anything to take for yeast infection. Pt uses [facility] on [address]. Please advise. Thanks! ep

## 2015-12-24 NOTE — Telephone Encounter (Signed)
To provider Barney Russomanno Dawn, CMA  

## 2015-12-24 NOTE — Telephone Encounter (Signed)
LMOVM for pt to call us back. See message below. Deseree Blount, CMA  

## 2015-12-25 NOTE — Telephone Encounter (Signed)
Attempted to call pt again, LMOVM for pt to call us back. Deseree Blount, CMA  

## 2015-12-25 NOTE — Telephone Encounter (Signed)
Pt informed. Deseree Blount, CMA  

## 2016-01-01 ENCOUNTER — Ambulatory Visit: Payer: Self-pay | Admitting: Family Medicine

## 2016-01-01 NOTE — Progress Notes (Deleted)
   Subjective:   Diane Lloyd is a 25 y.o. female with a history of *** here for ***  *** HTN: - Medications: *** - Compliance: *** - Checking BP at home: *** - Denies any SOB, CP, vision changes, LE edema, medication SEs, or symptoms of hypotension - Diet: *** - Exercise: ***  Review of Systems:  Per HPI.   Social History: *** smoker  Objective:  LMP 11/26/2015 (Approximate)   Gen:  25 y.o. female in NAD *** HEENT: NCAT, MMM, EOMI, PERRL, anicteric sclerae CV: RRR, no MRG, no JVD Resp: Non-labored, CTAB, no wheezes noted Abd: Soft, NTND, BS present, no guarding or organomegaly Ext: WWP, no edema MSK: Full ROM, strength intact Neuro: Alert and oriented, speech normal       Chemistry      Component Value Date/Time   NA 139 12/16/2015 0134   K 3.4 (L) 12/16/2015 0134   CL 106 12/16/2015 0134   CO2 24 12/16/2015 0134   BUN 9 12/16/2015 0134   CREATININE 0.77 12/16/2015 0134   CREATININE 0.86 12/12/2015 1441      Component Value Date/Time   CALCIUM 9.1 12/16/2015 0134   ALKPHOS 47 12/12/2015 1441   AST 20 12/12/2015 1441   ALT 21 12/12/2015 1441   BILITOT 0.3 12/12/2015 1441      Lab Results  Component Value Date   WBC 7.8 12/16/2015   HGB 11.1 (L) 12/16/2015   HCT 34.9 (L) 12/16/2015   MCV 80.4 12/16/2015   PLT 348 12/16/2015   Lab Results  Component Value Date   TSH 1.293 09/11/2014   Lab Results  Component Value Date   HGBA1C 5.4 12/12/2015   Assessment & Plan:     Diane Lloyd is a 25 y.o. female here for ***  No problem-specific Assessment & Plan notes found for this encounter.     Erasmo DownerAngela M Bacigalupo, MD MPH PGY-3,  [facility] 01/01/2016  10:02 AM

## 2016-01-22 ENCOUNTER — Ambulatory Visit (INDEPENDENT_AMBULATORY_CARE_PROVIDER_SITE_OTHER): Payer: Self-pay | Admitting: Family Medicine

## 2016-01-22 ENCOUNTER — Encounter: Payer: Self-pay | Admitting: Family Medicine

## 2016-01-22 ENCOUNTER — Ambulatory Visit: Payer: Self-pay | Admitting: Family Medicine

## 2016-01-22 VITALS — BP 128/76 | HR 89 | Temp 98.4°F | Ht 69.0 in | Wt 264.4 lb

## 2016-01-22 DIAGNOSIS — N938 Other specified abnormal uterine and vaginal bleeding: Secondary | ICD-10-CM

## 2016-01-22 LAB — POCT HEMOGLOBIN: Hemoglobin: 10.6 g/dL — AB (ref 12.2–16.2)

## 2016-01-22 MED ORDER — NORGESTIMATE-ETH ESTRADIOL 0.25-35 MG-MCG PO TABS: 1.0000 | ORAL_TABLET | Freq: Every day | ORAL | 2 refills | Status: DC

## 2016-01-22 NOTE — Progress Notes (Signed)
   Subjective: CC: dysmenorrhea ZOX:[id number]:Diane Lloyd is a 26 y.o. female presenting to clinic today for same day appointment. PCP: Shirlee LatchAngela Bacigalupo, MD Concerns today include:  1. Dysmenorrhea Patient reports painful menstrual cycles.  She reports she was started on OCPs for PCOS.  She notes starting medication in November.  Cycles got worse in December.  She reports cycles have not been regular since starting medication.  She report she has been having fairly constant vaginal bleeding since December.  Reports cramping.  Denies dizziness, lightheadedness.  Soaking 3-4 super pads daily.  No Known Allergies  Social Hx reviewed: non smoker. MedHx, current medications and allergies reviewed.  Please see EMR. ROS: Per HPI  Objective: Office vital signs reviewed. BP 128/76 (BP Location: Left Arm, Patient Position: Sitting, Cuff Size: Large)   Pulse 89   Temp 98.4 F (36.9 C) (Oral)   Ht 5\' 9"  (1.753 m)   Wt 264 lb 6.4 oz (119.9 kg)   LMP 11/26/2015 (Approximate)   SpO2 98%   BMI 39.05 kg/m   Physical Examination:  General: Awake, alert, obese, No acute distress HEENT: Normal, MMM    Eyes: EOMI, sclera white Cardio: regular rate and rhythm, S1S2 heard, no murmurs appreciated Pulm: clear to auscultation bilaterally, no wheezes, rhonchi or rales; normal work of breathing on room air  Results for orders placed or performed in visit on 01/22/16 (from the past 24 hour(s))  POCT hemoglobin     Status: Abnormal   Collection Time: 01/22/16 11:30 AM  Result Value Ref Range   Hemoglobin 10.6 (A) 12.2 - 16.2 g/dL   Assessment/ Plan: 26 y.o. female   1. Dysfunctional uterine bleeding - Discussed at length risks vs benefits of combined contraception in the setting of HTN and SLE.  Patient wishes to proceed with medication change back to ortho-cyclen - She declines Nexplanon/ Mirena/ Depo - Stop POP - norgestimate-ethinyl estradiol (ORTHO-CYCLEN, 28,) 0.25-35 MG-MCG tablet; Take 1 tablet  by mouth daily.  Dispense: 1 Package; Refill: 2 - POCT hemoglobin (stable)  Follow up with PCP in 3 months or sooner if needed.  This patient was discussed with Dr Jennette KettleNeal, who agrees with my assessment and plan.   Raliegh IpAshly M Gottschalk, DO PGY-3, [facility]

## 2016-01-22 NOTE — Patient Instructions (Signed)
We discussed the risks and benefits of combined oral contraceptive pills.  You understand these risks.  Plan to follow up with Dr Beryle FlockBacigalupo in the next 3 months to check in.   Abnormal Uterine Bleeding Abnormal uterine bleeding means bleeding from the vagina that is not your normal menstrual period. This can be:  Bleeding or spotting between periods.  Bleeding after sex (sexual intercourse).  Bleeding that is heavier or more than normal.  Periods that last longer than usual.  Bleeding after menopause. There are many problems that may cause this. Treatment will depend on the cause of the bleeding. Any kind of bleeding that is not normal should be reviewed by your doctor. Follow these instructions at home: Watch your condition for any changes. These actions may lessen any discomfort you are having:  Do not use tampons or douches as told by your doctor.  Change your pads often. You should get regular pelvic exams and Pap tests. Keep all appointments for tests as told by your doctor. Contact a doctor if:  You are bleeding for more than 1 week.  You feel dizzy at times. Get help right away if:  You pass out.  You have to change pads every 15 to 30 minutes.  You have belly pain.  You have a fever.  You become sweaty or weak.  You are passing large blood clots from the vagina.  You feel sick to your stomach (nauseous) and throw up (vomit). This information is not intended to replace advice given to you by your health care provider. Make sure you discuss any questions you have with your health care provider. Document Released: 10/25/2008 Document Revised: 06/05/2015 Document Reviewed: 07/27/2012 Elsevier Interactive Patient Education  2017 [employer]

## 2016-02-02 ENCOUNTER — Other Ambulatory Visit: Payer: Self-pay | Admitting: *Deleted

## 2016-02-03 ENCOUNTER — Other Ambulatory Visit (HOSPITAL_COMMUNITY)
Admission: RE | Admit: 2016-02-03 | Discharge: 2016-02-03 | Disposition: A | Discharge status: Home / Self Care | Payer: PRIVATE HEALTH INSURANCE | Source: Ambulatory Visit | Attending: Family Medicine | Admitting: Family Medicine

## 2016-02-03 ENCOUNTER — Ambulatory Visit (INDEPENDENT_AMBULATORY_CARE_PROVIDER_SITE_OTHER): Payer: PRIVATE HEALTH INSURANCE | Admitting: Family Medicine

## 2016-02-03 ENCOUNTER — Encounter: Payer: Self-pay | Admitting: Family Medicine

## 2016-02-03 VITALS — BP 128/72 | HR 95 | Temp 98.3°F | Ht 69.0 in | Wt 263.6 lb

## 2016-02-03 DIAGNOSIS — Z113 Encounter for screening for infections with a predominantly sexual mode of transmission: Secondary | ICD-10-CM | POA: Diagnosis not present

## 2016-02-03 DIAGNOSIS — B373 Candidiasis of vulva and vagina: Secondary | ICD-10-CM | POA: Diagnosis not present

## 2016-02-03 DIAGNOSIS — B3731 Acute candidiasis of vulva and vagina: Secondary | ICD-10-CM

## 2016-02-03 DIAGNOSIS — N898 Other specified noninflammatory disorders of vagina: Secondary | ICD-10-CM | POA: Diagnosis not present

## 2016-02-03 LAB — POCT WET PREP (WET MOUNT)
Clue Cells Wet Prep Whiff POC: NEGATIVE
Trichomonas Wet Prep HPF POC: ABSENT
WBC, Wet Prep HPF POC: >20

## 2016-02-03 MED ORDER — FLUCONAZOLE 150 MG PO TABS: 150.0000 mg | ORAL_TABLET | Freq: Once | ORAL | 1 refills | Status: AC

## 2016-02-03 NOTE — Patient Instructions (Signed)
Exam was consistent with a vaginal yeast infection. I prescribed Diflucan. If after the first tablet your symptoms do not resolve in 5 days, you can get a refill and repeat the dose.  Our office will call you with results of the other test. If your symptoms fail to resolve despite treatment, please follow-up with us. Seek care if you develop abdominal pain, burning with urination, fevers, chills, or back pain.

## 2016-02-03 NOTE — Progress Notes (Signed)
Subjective: CC: vaginitis  HPI: Patient is a 26 y.o. female presenting to clinic today for a SDA .  VAGINAL DISCHARGE  Having vaginal discharge for 2-3 days. Medications tried: nothing  Discharge consistency: thick, chunky  Discharge color: brownish yellow Recent antibiotic use: last month (Flagyl) Sex in last month: last end of Nov  Possible STD exposure:no; but would like tested  Symptoms Fever: no Dysuria:no Vaginal bleeding: no Abdomen or Pelvic pain: no Back pain: no Genital sores or ulcers:no Rash: no Pain during sex: no Missed menstrual period: no  Social History: non-smoker  Flu Vaccine: up to date    ROS: All other systems reviewed and are negative.  Past Medical History Patient Active Problem List   Diagnosis Date Noted  . Vaginal candidiasis 12/18/2015  . Diarrhea 11/10/2015  . Gastroenteritis 09/24/2015  . Screen for STD (sexually transmitted disease) 09/21/2015  . Rash and nonspecific skin eruption 08/31/2015  . Dysmenorrhea 08/31/2015  . Allergic rhinitis 05/17/2013  . Lupus   . GERD (gastroesophageal reflux disease) 03/20/2012  . Polycystic ovarian syndrome 12/26/2011  . Family history of cardiac disorder 02/17/2011  . Contraception management 12/30/2010  . 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 Essential hypertension 01/27/2010  . ANEMIA, IRON DEFICIENCY 06/27/2009  . Obesity 03/10/2006  . ASTHMA, INTERMITTENT 03/10/2006  . ECZEMA, ATOPIC DERMATITIS 03/10/2006    Medications- reviewed and updated Current Outpatient Prescriptions  Medication Sig Dispense Refill  . amLODipine (NORVASC) 5 MG tablet Take 1 tablet (5 mg total) by mouth daily. 30 tablet 11  . fluconazole (DIFLUCAN) 150 MG tablet Take 1 tablet (150 mg total) by mouth once. 1 tablet 1  . metroNIDAZOLE (FLAGYL) 500 MG tablet Take 1 tablet (500 mg total) by mouth 2 (two) times daily. 14 tablet 0  . naproxen (NAPROSYN) 500 MG tablet Take 500 mg every 12  hours for mild-moderate pain 30 tablet 0  . naproxen sodium (ANAPROX) 220 MG tablet Take 440 mg by mouth 2 (two) times daily as needed (pain).    . norgestimate-ethinyl estradiol (ORTHO-CYCLEN, 28,) 0.25-35 MG-MCG tablet Take 1 tablet by mouth daily. 1 Package 2   No current facility-administered medications for this visit.     Objective: Office vital signs reviewed. BP 128/72 (BP Location: Left Arm, Patient Position: Sitting, Cuff Size: Large)   Pulse 95   Temp 98.3 F (36.8 C) (Oral)   Ht 5\' 9"  (1.753 m)   Wt 263 lb 9.6 oz (119.6 kg)   SpO2 98%   BMI 38.93 kg/m    Physical Examination:  General: Awake, alert, well0 nourished, NAD GI: soft, NT/ND,+BS x4, no hepatomegaly, no splenomegaly GU: GYN:  External genitalia within normal limits.  Vaginal mucosa pink, moist, normal rugae.  Nonfriable cervix without lesions. Thick, curd-like brown/yellow discharge.  Bimanual exam revealed normal, nongravid uterus.  No cervical motion tenderness. Difficult to assess for adnexal masses given habitus.    Wet prep: moderate yeast, > 20 WBC  Assessment/Plan: Vaginal candidiasis Exam and wet prep consistent with candidiasis. Patient seems pretty knowledgeable about when she has yeast infections.  - GC/chlamydia pending - Rx for diflucan with 1 refill if symptoms do not resolve after 5 days. - return if change in symptoms or no improvement   Orders Placed This Encounter  Procedures  . POCT Wet Prep [facility] [place])    Meds ordered this encounter  Medications  . fluconazole (DIFLUCAN) 150 MG tablet    Sig: Take 1 tablet (150 mg total)  by mouth once.    Dispense:  1 tablet    Refill:  1    Joanna Puff PGY-3, [facility]

## 2016-02-03 NOTE — Assessment & Plan Note (Signed)
Exam and wet prep consistent with candidiasis. Patient seems pretty knowledgeable about when she has yeast infections.  - GC/chlamydia pending - Rx for diflucan with 1 refill if symptoms do not resolve after 5 days. - return if change in symptoms or no improvement

## 2016-02-04 ENCOUNTER — Telehealth: Payer: Self-pay | Admitting: Family Medicine

## 2016-02-04 LAB — CERVICOVAGINAL ANCILLARY ONLY
Chlamydia: NEGATIVE
Neisseria Gonorrhea: NEGATIVE

## 2016-02-04 NOTE — Telephone Encounter (Signed)
Please let the patient know that testing for both gonorrhea and chlamydia are negative.  Thanks, Joanna Puffrystal S. Dorsey, MD [facility] Res[facility]  02/04/2016, 9:30 PM

## 2016-02-05 ENCOUNTER — Telehealth: Payer: Self-pay | Admitting: *Deleted

## 2016-02-05 NOTE — Telephone Encounter (Signed)
Left message on voicemail for patient to return call. 

## 2016-02-05 NOTE — Telephone Encounter (Signed)
Patient called stating she had an allergic reaction to Diflucan.  Patient took medication two days ago and caused her face to breakout and itch.  She noticed it happen last month as well.  Patient denies any breathing problems or swelling.  Advised patient not to take medication again.  Allergy list updated.  Patient took benadryl to help with the itching and redness.  Advised patient she could also try hydrocortisone cream.  Will forward to PCP.  Clovis PuMartin, Rynell Ciotti L, RN

## 2016-03-05 ENCOUNTER — Other Ambulatory Visit: Payer: Self-pay | Admitting: Physician Assistant

## 2016-03-05 ENCOUNTER — Other Ambulatory Visit (HOSPITAL_COMMUNITY)
Admission: RE | Admit: 2016-03-05 | Discharge: 2016-03-05 | Disposition: A | Discharge status: Home / Self Care | Payer: PRIVATE HEALTH INSURANCE | Source: Ambulatory Visit | Attending: Family Medicine | Admitting: Family Medicine

## 2016-03-05 DIAGNOSIS — Z124 Encounter for screening for malignant neoplasm of cervix: Secondary | ICD-10-CM | POA: Insufficient documentation

## 2016-03-10 LAB — CYTOLOGY - PAP: Diagnosis: NEGATIVE

## 2016-03-29 ENCOUNTER — Encounter: Payer: PRIVATE HEALTH INSURANCE | Admitting: Podiatry

## 2016-03-29 NOTE — Patient Instructions (Signed)

## 2016-04-07 ENCOUNTER — Ambulatory Visit (INDEPENDENT_AMBULATORY_CARE_PROVIDER_SITE_OTHER): Payer: PRIVATE HEALTH INSURANCE | Admitting: Podiatry

## 2016-04-07 ENCOUNTER — Encounter: Payer: Self-pay | Admitting: Podiatry

## 2016-04-07 VITALS — Resp 16 | Ht 69.0 in | Wt 260.0 lb

## 2016-04-07 DIAGNOSIS — L600 Ingrowing nail: Secondary | ICD-10-CM | POA: Diagnosis not present

## 2016-04-07 DIAGNOSIS — B351 Tinea unguium: Secondary | ICD-10-CM

## 2016-04-07 NOTE — Patient Instructions (Signed)

## 2016-04-07 NOTE — Progress Notes (Signed)
   Subjective:    Patient ID: Diane Lloyd, female    DOB: 24-Sep-1990, 24 y.o.   MRN: [id number]  HPI Chief Complaint  Patient presents with  . Nail Problem    Bilateral; great toe-both sides; x2 months; Right foot-4th toe-lateral side; pt stated, "nail started to hurt after got a pedicure"; x1 month      Review of Systems  Gastrointestinal: Positive for abdominal pain and nausea.  Musculoskeletal: Positive for arthralgias.  Neurological: Positive for dizziness and headaches.  All other systems reviewed and are negative.      Objective:   Physical Exam        Assessment & Plan:

## 2016-04-08 NOTE — Progress Notes (Signed)
Subjective:     Patient ID: Diane Lloyd, female   DOB: June 13, 1990, 26 y.o.   MRN: [id number]  HPI patient presents stating that she has chronic ingrown toenails on both her big toes and her fourth nail right she thinks might of been infected when getting up a pedicure. States both sides of the big toes really bother her and this is been going on for a long time   Review of Systems  All other systems reviewed and are negative.      Objective:   Physical Exam  Constitutional: She is oriented to person, place, and time.  Cardiovascular: Intact distal pulses.   Musculoskeletal: Normal range of motion.  Neurological: She is oriented to person, place, and time.  Skin: Skin is warm.  Nursing note and vitals reviewed.  neurovascular status intact muscle strength adequate range of motion within normal limits. Patient's noted to have incurvated hallux nails bilateral medial lateral borders that are painful when pressed with distal redness but no active drainage and slight discoloration of the fourth nail right. Patient has good digital perfusion and is well oriented 3     Assessment:     Chronic ingrown toenail deformity hallux bilateral with probable mycotic infection of the fourth nail right    Plan:     H&P and conditions discussed separately. At this point we will allow the fourth nail to regrow and see what we'll do and it may require treatment in future. For the hallux nails I infiltrated each hallux 60 mg Xylocaine Marcaine mixture remove the medial and lateral borders exposed matrix and applied phenol 3 applications 30 seconds followed by alcohol lavage and sterile dressing before the procedure I did educate her on risk and the fact the nails may not be as healthy as they were previously

## 2016-04-09 ENCOUNTER — Ambulatory Visit: Payer: PRIVATE HEALTH INSURANCE | Admitting: Podiatry

## 2016-04-30 ENCOUNTER — Encounter: Payer: Self-pay | Admitting: Podiatry

## 2016-04-30 ENCOUNTER — Ambulatory Visit (INDEPENDENT_AMBULATORY_CARE_PROVIDER_SITE_OTHER): Payer: PRIVATE HEALTH INSURANCE | Admitting: Podiatry

## 2016-04-30 DIAGNOSIS — L03031 Cellulitis of right toe: Secondary | ICD-10-CM | POA: Diagnosis not present

## 2016-04-30 NOTE — Patient Instructions (Signed)

## 2016-04-30 NOTE — Progress Notes (Signed)
Subjective:     Patient ID: Diane Lloyd, female   DOB: 1990-02-17, 26 y.o.   MRN: [id number]  HPI patient presents stating that she was worried because she had some drainage from her big toes and she just started an antibiotic yesterday   Review of Systems     Objective:   Physical Exam Neurovascular status intact muscle strength adequate with localized drainage of the hallux nail right over left that is not systemic and there is no proximal edema erythema or drainage noted currently    Assessment:     Mild localized paronychia infection hallux bilateral    Plan:     H&P condition reviewed and advised on changing the types soaks and she does keeping the bandage during the day continue antibiotics for 10 more days and let him air dry at night. If symptoms persist she'll be seen back

## 2016-05-05 ENCOUNTER — Telehealth: Payer: Self-pay | Admitting: *Deleted

## 2016-05-05 NOTE — Telephone Encounter (Signed)
Pt states she had a toenail procedure and she knows it is infected. I left message informing pt that this early in the post op period she would have redness, clear drainage and swelling, to continue the epsom salt or betadine soaks for 20 minutes 2 times daily followed with an antibiotic ointment bandaid for 4-6 weeks, but if the area had more redness, drainage and swelling than right after the procedure to call and make an appt to be seen.

## 2016-05-10 NOTE — Progress Notes (Signed)
This encounter was created in error - please disregard.

## 2016-07-08 ENCOUNTER — Emergency Department (HOSPITAL_COMMUNITY)
Admission: EM | Admit: 2016-07-08 | Discharge: 2016-07-08 | Discharge status: Against Medical Advice | Payer: PRIVATE HEALTH INSURANCE

## 2016-07-08 NOTE — ED Triage Notes (Signed)
Pt gave stickers to registration and told them she did not wish to be seen anymore

## 2016-07-08 NOTE — ED Triage Notes (Signed)
Pt did not answer for triage x1 

## 2016-08-16 ENCOUNTER — Emergency Department (HOSPITAL_COMMUNITY)
Admission: EM | Admit: 2016-08-16 | Discharge: 2016-08-16 | Disposition: A | Discharge status: Home / Self Care | Payer: PRIVATE HEALTH INSURANCE | Attending: Emergency Medicine | Admitting: Emergency Medicine

## 2016-08-16 ENCOUNTER — Encounter (HOSPITAL_COMMUNITY): Payer: Self-pay | Admitting: Emergency Medicine

## 2016-08-16 DIAGNOSIS — Z793 Long term (current) use of hormonal contraceptives: Secondary | ICD-10-CM | POA: Insufficient documentation

## 2016-08-16 DIAGNOSIS — M3210 Systemic lupus erythematosus, organ or system involvement unspecified: Secondary | ICD-10-CM | POA: Diagnosis not present

## 2016-08-16 DIAGNOSIS — Z79899 Other long term (current) drug therapy: Secondary | ICD-10-CM | POA: Insufficient documentation

## 2016-08-16 DIAGNOSIS — G5602 Carpal tunnel syndrome, left upper limb: Secondary | ICD-10-CM | POA: Diagnosis not present

## 2016-08-16 DIAGNOSIS — R200 Anesthesia of skin: Secondary | ICD-10-CM | POA: Diagnosis present

## 2016-08-16 LAB — CBC
HCT: 34.3 % — ABNORMAL LOW (ref 36.0–46.0)
Hemoglobin: 11 g/dL — ABNORMAL LOW (ref 12.0–15.0)
MCH: 25.3 pg — ABNORMAL LOW (ref 26.0–34.0)
MCHC: 32.1 g/dL (ref 30.0–36.0)
MCV: 79 fL (ref 78.0–100.0)
Platelets: 315 10*3/uL (ref 150–400)
RBC: 4.34 MIL/uL (ref 3.87–5.11)
RDW: 15.2 % (ref 11.5–15.5)
WBC: 6.3 10*3/uL (ref 4.0–10.5)

## 2016-08-16 LAB — BASIC METABOLIC PANEL
Anion gap: 9 (ref 5–15)
BUN: 8 mg/dL (ref 6–20)
CO2: 22 mmol/L (ref 22–32)
Calcium: 9 mg/dL (ref 8.9–10.3)
Chloride: 107 mmol/L (ref 101–111)
Creatinine, Ser: 0.77 mg/dL (ref 0.44–1.00)
GFR calc Af Amer: >60 mL/min (ref >60)
GFR calc non Af Amer: >60 mL/min (ref >60)
Glucose, Bld: 83 mg/dL (ref 65–99)
Potassium: 4 mmol/L (ref 3.5–5.1)
Sodium: 138 mmol/L (ref 135–145)

## 2016-08-16 MED ORDER — IBUPROFEN 600 MG PO TABS: 600.0000 mg | ORAL_TABLET | Freq: Three times a day (TID) | ORAL | 0 refills | Status: AC

## 2016-08-16 NOTE — Discharge Instructions (Signed)
Take ibuprofen 3 times a day with meals. Do not take other anti-inflammatories at the same time (Advil, Motrin, naproxen, Aleve).  Stay well-hydrated. Follow-up with your primary care doctor in 1 week to ensure resolution of your symptoms, and for further evaluation of your tingling. Return to the emergency department if you develop fever, chills, vomiting, worsening weakness, or any new or worsening symptoms.

## 2016-08-16 NOTE — ED Provider Notes (Signed)
[facility]-EMERGENCY DEPT Provider Note   CSN: [id number] Arrival date & time: 08/16/16  0906     History   Chief Complaint Chief Complaint  Patient presents with  . Numbness    HPI Diane Lloyd is a 26 y.o. female presenting with three-day history of tingling and one-day history left hand numbness.  Patient states that for the past 3 days, she's had intermittent waxing and waning tingling in various parts of her body ranging from her feet to her hands to her ears. She states she cannot produce the tingling. She does not think she has ever experienced this before. Additionally, she reports that last night she started to have numbness of the distal aspect of the index and middle finger of her left hand. The numbness also wax and wanes, but is reproducible. It does not spread elsewhere. She has never had numbness like this before. She denies fevers, chills, congestion, vision changes, sore throat, cough, chest pain, shortness of breath, nausea, vomiting, new abdominal pain, weakness, or dropping things. She denies urinary symptoms or abnormal bowel movements. She reports she has a history of lupus diagnosed in 2010, but has never been on any treatment for this. She does not think she's ever had any lupus flares. She has a history of high blood pressure for which she is supposed to be taking amlodipine, but only takes it intermittently. Her appetite is normal, and she reports poor fluid intake. She reports chronic dizziness, unchanged from normal, and chronic abdominal pain, unchanged from normal. Her chart review, patient has a positive Tinel's sign of the left hand.  HPI  Past Medical History:  Diagnosis Date  . Anemia    iron def  . Asthma   . Lupus    + ANA in 2012  . Obesity, unspecified   . PCOS (polycystic ovarian syndrome)     Patient Active Problem List   Diagnosis Date Noted  . Vaginal candidiasis 12/18/2015  . Diarrhea 11/10/2015  . Gastroenteritis 09/24/2015  . Screen for  STD (sexually transmitted disease) 09/21/2015  . Rash and nonspecific skin eruption 08/31/2015  . Dysmenorrhea 08/31/2015  . Allergic rhinitis 05/17/2013  . Lupus   . GERD (gastroesophageal reflux disease) 03/20/2012  . Polycystic ovarian syndrome 12/26/2011  . Family history of cardiac disorder 02/17/2011  . Contraception management 12/30/2010  . 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 Essential hypertension 01/27/2010  . ANEMIA, IRON DEFICIENCY 06/27/2009  . Obesity 03/10/2006  . ASTHMA, INTERMITTENT 03/10/2006  . ECZEMA, ATOPIC DERMATITIS 03/10/2006    Past Surgical History:  Procedure Laterality Date  . TONSILLECTOMY      OB History    Gravida Para Term Preterm AB Living   0             SAB TAB Ectopic Multiple Live Births                   Home Medications    Prior to Admission medications   Medication Sig Start Date End Date Taking? Authorizing Provider  naproxen sodium (ANAPROX) 220 MG tablet Take 440 mg by mouth 2 (two) times daily with a meal.   Yes [provider]  norgestimate-ethinyl estradiol (ORTHO-CYCLEN, 28,) 0.25-35 MG-MCG tablet Take 1 tablet by mouth daily. 01/22/16  Yes Gottschalk, Kathie Rhodes M, DO  amLODipine (NORVASC) 5 MG tablet Take 1 tablet (5 mg total) by mouth daily. Patient not taking: Reported on 08/16/2016 10/27/15   Latrelle Dodrill, MD  ibuprofen (ADVIL,MOTRIN) 600  MG tablet Take 1 tablet (600 mg total) by mouth every 8 (eight) hours. 08/16/16 08/26/16  Joshue Badal, PA-C    Family History Family History  Problem Relation Age of Onset  . Cancer Father 1552       esophagus  . Asthma Father   . Hypertension Mother   . Lupus Mother   . Diabetes Mother   . Cancer Maternal Uncle 40       pancreatic cancer  . Cancer Paternal Grandmother 4660       esophageal    Social History Social History  Substance Use Topics  . Smoking status: Never Smoker  . Smokeless tobacco: Never Used  . Alcohol use Yes      Comment: occasional     Allergies   Diflucan [fluconazole]   Review of Systems Review of Systems  Constitutional: Negative for chills and fever.  HENT: Negative for congestion and sore throat.   Eyes: Negative for photophobia and visual disturbance.  Respiratory: Negative for cough, chest tightness and shortness of breath.   Cardiovascular: Negative for chest pain, palpitations and leg swelling.  Gastrointestinal: Negative for abdominal distention, constipation, diarrhea, nausea and vomiting.  Genitourinary: Negative for dysuria, frequency and hematuria.  Musculoskeletal: Negative for back pain, neck pain and neck stiffness.  Skin: Negative for color change, pallor and rash.  Allergic/Immunologic: Negative for immunocompromised state.  Neurological: Positive for dizziness and numbness. Negative for speech difficulty, weakness, light-headedness and headaches.       Tingling  Psychiatric/Behavioral: Negative for agitation and confusion.     Physical Exam Updated Vital Signs BP 128/75   Pulse 85   Temp 98.8 F (37.1 C) (Oral)   Resp 18   SpO2 96%   Physical Exam  Constitutional: She is oriented to person, place, and time. She appears well-developed and well-nourished. No distress.  HENT:  Head: Normocephalic and atraumatic.  Right Ear: Tympanic membrane, external ear and ear canal normal.  Left Ear: Tympanic membrane, external ear and ear canal normal.  Nose: Nose normal.  Mouth/Throat: Uvula is midline, oropharynx is clear and moist and mucous membranes are normal.  Eyes: Pupils are equal, round, and reactive to light. Conjunctivae and EOM are normal.  Neck: Normal range of motion. Neck supple.  Cardiovascular: Normal rate, regular rhythm and intact distal pulses.   Pulmonary/Chest: Effort normal and breath sounds normal. No respiratory distress. She has no wheezes. She exhibits no tenderness.  Abdominal: Soft. Bowel sounds are normal. She exhibits no distension and no  mass. There is no tenderness. There is no rebound and no guarding.  Musculoskeletal: Normal range of motion.  Color and warmth of extremities equal 4. Pedal and radial pulses intact 4. Sensation intact 4. Strength equal upper and lower extremities.  Lymphadenopathy:    She has no cervical adenopathy.  Neurological: She is alert and oriented to person, place, and time. She has normal strength and normal reflexes. No cranial nerve deficit or sensory deficit. She displays a negative Romberg sign. GCS eye subscore is 4. GCS verbal subscore is 5. GCS motor subscore is 6.  Positive Phalen's test for numbness of the left hand. Negative Tinel's test.  Skin: Skin is warm and dry. No rash noted.  Psychiatric: She has a normal mood and affect.  Nursing note and vitals reviewed.    ED Treatments / Results  Labs (all labs ordered are listed, but only abnormal results are displayed) Labs Reviewed  CBC - Abnormal; Notable for the following:  Result Value   Hemoglobin 11.0 (*)    HCT 34.3 (*)    MCH 25.3 (*)    All other components within normal limits  BASIC METABOLIC PANEL    EKG  EKG Interpretation None       Radiology No results found.  Procedures Procedures (including critical care time)  Medications Ordered in ED Medications - No data to display   Initial Impression / Assessment and Plan / ED Course  I have reviewed the triage vital signs and the nursing notes.  Pertinent labs & imaging results that were available during my care of the patient were reviewed by me and considered in my medical decision making (see chart for details).     Patient presenting with intermittent tingling and left hand numbness. Physical exam shows positive Phalen's. Likely carpal tunnel of the left hand. Will order CBC and BMP to assess for anemia or electrolyte imbalance. Labs showed no sign of anemia or electrolyte imbalance. At this time, pt appears safe for discharge. Will discharge  patient with regimented anti-inflammatories for treatment of her carpal tunnel syndrome. Patient is to follow-up with primary care to ensure symptom resolution, and regarding her tingling. Possible flare of lupus causing tingling sensation, which can be f/u with PCP. Return precautions given. Pt states she understands and agrees to plan.   Final Clinical Impressions(s) / ED Diagnoses   Final diagnoses:  Carpal tunnel syndrome of left wrist    New Prescriptions New Prescriptions   IBUPROFEN (ADVIL,MOTRIN) 600 MG TABLET    Take 1 tablet (600 mg total) by mouth every 8 (eight) hours.     Alveria Apley, PA-C 08/16/16 1607    Bethann Berkshire, MD 08/17/16 1024

## 2016-08-16 NOTE — ED Triage Notes (Signed)
Pt c/o generalized tingling and numbness onset Thursday, left 2nd and third finger pain and tingling onset yesterday at 2330.  Finger pain and tingling reproducible with tapping to right anterior wrist. Chronic dizziness at baseline without new changes. No weakness, confusion, vision changes.

## 2016-09-30 ENCOUNTER — Encounter: Payer: Self-pay | Admitting: Podiatry

## 2016-09-30 ENCOUNTER — Ambulatory Visit (INDEPENDENT_AMBULATORY_CARE_PROVIDER_SITE_OTHER): Payer: PRIVATE HEALTH INSURANCE | Admitting: Podiatry

## 2016-09-30 DIAGNOSIS — L600 Ingrowing nail: Secondary | ICD-10-CM | POA: Diagnosis not present

## 2016-09-30 NOTE — Patient Instructions (Signed)

## 2016-09-30 NOTE — Progress Notes (Signed)
Subjective:    Patient ID: Diane Lloyd, female   DOB: 26 y.o.   MRN: [id number]   HPI patient states that she has traumatized both big toenails but the left one has become painful on the top and on this corner it's incurvated and irritated    ROS      Objective:  Physical Exam neurovascular status intact with patient found to have medial irritation incurvation of the bed with dystrophic changes on the dorsum of the left hallux nail that is painful when pressed     Assessment:    Combination of damaged hallux nail secondary to previous injury occurring several months ago along with ingrown component left hallux medial border     Plan:    H&P both conditions reviewed and today I went ahead infiltrated the left hallux 60 mg like Marcaine mixture remove the medial spicule and applied phenol 3 applications 30 seconds followed by alcohol lavaged sterile dressing and remove the remainder of the nail and allowed to regrow and if it does not grow out normally may eventually require removal of the entire nail bed

## 2016-10-08 ENCOUNTER — Ambulatory Visit: Payer: PRIVATE HEALTH INSURANCE | Admitting: Podiatry

## 2016-10-15 ENCOUNTER — Emergency Department (HOSPITAL_COMMUNITY): Payer: PRIVATE HEALTH INSURANCE

## 2016-10-15 ENCOUNTER — Encounter (HOSPITAL_COMMUNITY): Payer: Self-pay | Admitting: *Deleted

## 2016-10-15 ENCOUNTER — Emergency Department (HOSPITAL_COMMUNITY)
Admission: EM | Admit: 2016-10-15 | Discharge: 2016-10-15 | Disposition: A | Discharge status: Home / Self Care | Payer: PRIVATE HEALTH INSURANCE | Attending: Emergency Medicine | Admitting: Emergency Medicine

## 2016-10-15 DIAGNOSIS — Z79899 Other long term (current) drug therapy: Secondary | ICD-10-CM | POA: Diagnosis not present

## 2016-10-15 DIAGNOSIS — R079 Chest pain, unspecified: Secondary | ICD-10-CM | POA: Diagnosis present

## 2016-10-15 DIAGNOSIS — R0789 Other chest pain: Secondary | ICD-10-CM | POA: Diagnosis not present

## 2016-10-15 LAB — CBC WITH DIFFERENTIAL/PLATELET
Basophils Absolute: 0 10*3/uL (ref 0.0–0.1)
Basophils Relative: 0 %
Eosinophils Absolute: 0.1 10*3/uL (ref 0.0–0.7)
Eosinophils Relative: 1 %
HCT: 34.3 % — ABNORMAL LOW (ref 36.0–46.0)
Hemoglobin: 10.9 g/dL — ABNORMAL LOW (ref 12.0–15.0)
Lymphocytes Relative: 30 %
Lymphs Abs: 1.9 10*3/uL (ref 0.7–4.0)
MCH: 25.4 pg — ABNORMAL LOW (ref 26.0–34.0)
MCHC: 31.8 g/dL (ref 30.0–36.0)
MCV: 80 fL (ref 78.0–100.0)
Monocytes Absolute: 0.4 10*3/uL (ref 0.1–1.0)
Monocytes Relative: 7 %
Neutro Abs: 3.9 10*3/uL (ref 1.7–7.7)
Neutrophils Relative %: 62 %
Platelets: 331 10*3/uL (ref 150–400)
RBC: 4.29 MIL/uL (ref 3.87–5.11)
RDW: 14.9 % (ref 11.5–15.5)
WBC: 6.3 10*3/uL (ref 4.0–10.5)

## 2016-10-15 LAB — D-DIMER, QUANTITATIVE: D-Dimer, Quant: 0.31 ug/mL-FEU (ref 0.00–0.50)

## 2016-10-15 LAB — POCT I-STAT TROPONIN I: Troponin i, poc: 0 ng/mL (ref 0.00–0.08)

## 2016-10-15 LAB — COMPREHENSIVE METABOLIC PANEL
ALT: 33 U/L (ref 14–54)
AST: 24 U/L (ref 15–41)
Albumin: 4.1 g/dL (ref 3.5–5.0)
Alkaline Phosphatase: 66 U/L (ref 38–126)
Anion gap: 12 (ref 5–15)
BUN: 9 mg/dL (ref 6–20)
CO2: 24 mmol/L (ref 22–32)
Calcium: 9.2 mg/dL (ref 8.9–10.3)
Chloride: 102 mmol/L (ref 101–111)
Creatinine, Ser: 0.73 mg/dL (ref 0.44–1.00)
GFR calc Af Amer: >60 mL/min (ref >60)
GFR calc non Af Amer: >60 mL/min (ref >60)
Glucose, Bld: 101 mg/dL — ABNORMAL HIGH (ref 65–99)
Potassium: 3.5 mmol/L (ref 3.5–5.1)
Sodium: 138 mmol/L (ref 135–145)
Total Bilirubin: 0.3 mg/dL (ref 0.3–1.2)
Total Protein: 7.5 g/dL (ref 6.5–8.1)

## 2016-10-15 MED ORDER — METHOCARBAMOL 500 MG PO TABS: 1000.0000 mg | ORAL_TABLET | Freq: Three times a day (TID) | ORAL | 0 refills | Status: DC | PRN

## 2016-10-15 NOTE — ED Triage Notes (Signed)
Pt complains of intermittent chest pain x 3 days which became constant over the past day. Pt states she felt short of breath this morning. Pt took ibuprofen at work last night, which she states did not help.

## 2016-10-15 NOTE — ED Provider Notes (Signed)
[facility] DEPT Provider Note   CSN: [id number] Arrival date & time: 10/15/16  0841     History   Chief Complaint Chief Complaint  Patient presents with  . Chest Pain    HPI Diane Lloyd is a 26 y.o. female.  HPI Patient with episodic left-sided chest pain over the last 3 days. Worsened yesterday and developed shortness of breath this morning. Says the pain has since resolved. Denies any fever or chills. No cough. No new lower extremity swelling or pain. Patient is on oral birth control. Past Medical History:  Diagnosis Date  . Anemia    iron def  . Asthma   . Lupus    + ANA in 2012  . Obesity, unspecified   . PCOS (polycystic ovarian syndrome)     Patient Active Problem List   Diagnosis Date Noted  . Vaginal candidiasis 12/18/2015  . Diarrhea 11/10/2015  . Gastroenteritis 09/24/2015  . Screen for STD (sexually transmitted disease) 09/21/2015  . Rash and nonspecific skin eruption 08/31/2015  . Dysmenorrhea 08/31/2015  . Allergic rhinitis 05/17/2013  . Lupus   . GERD (gastroesophageal reflux disease) 03/20/2012  . Polycystic ovarian syndrome 12/26/2011  . Family history of cardiac disorder 02/17/2011  . Contraception management 12/30/2010  . 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 Essential hypertension 01/27/2010  . ANEMIA, IRON DEFICIENCY 06/27/2009  . Obesity 03/10/2006  . ASTHMA, INTERMITTENT 03/10/2006  . ECZEMA, ATOPIC DERMATITIS 03/10/2006    Past Surgical History:  Procedure Laterality Date  . TONSILLECTOMY      OB History    Gravida Para Term Preterm AB Living   0             SAB TAB Ectopic Multiple Live Births                   Home Medications    Prior to Admission medications   Medication Sig Start Date End Date Taking? Authorizing Provider  ibuprofen (ADVIL,MOTRIN) 200 MG tablet Take 400 mg by mouth every 6 (six) hours as needed for moderate pain.   Yes [provider]  naproxen sodium (ANAPROX)  220 MG tablet Take 440 mg by mouth 2 (two) times daily with a meal.   Yes [provider]  methocarbamol (ROBAXIN) 500 MG tablet Take 2 tablets (1,000 mg total) by mouth every 8 (eight) hours as needed for muscle spasms. 10/15/16   Loren Racer, MD    Family History Family History  Problem Relation Age of Onset  . Cancer Father 46       esophagus  . Asthma Father   . Hypertension Mother   . Lupus Mother   . Diabetes Mother   . Cancer Maternal Uncle 40       pancreatic cancer  . Cancer Paternal Grandmother 19       esophageal    Social History Social History  Substance Use Topics  . Smoking status: Never Smoker  . Smokeless tobacco: Never Used  . Alcohol use Yes     Comment: occasional     Allergies   Diflucan [fluconazole]   Review of Systems Review of Systems  Constitutional: Negative for chills, fatigue and fever.  Respiratory: Positive for shortness of breath. Negative for cough.   Cardiovascular: Positive for chest pain. Negative for palpitations and leg swelling.  Gastrointestinal: Negative for abdominal pain, constipation, diarrhea, nausea and vomiting.  Musculoskeletal: Negative for back pain, myalgias, neck pain and neck stiffness.  Skin: Negative for  rash and wound.  Neurological: Negative for dizziness, weakness, light-headedness, numbness and headaches.  All other systems reviewed and are negative.    Physical Exam Updated Vital Signs BP 139/84 (BP Location: Left Arm)   Pulse 76   Temp 98.6 F (37 C) (Oral)   Resp 14   Ht  (1.753 m)   Wt 115.7 kg (255 lb)   LMP 09/02/2016   SpO2 100%   BMI 37.66 kg/m   Physical Exam  Constitutional: She is oriented to person, place, and time. She appears well-developed and well-nourished. No distress.  HENT:  Head: Normocephalic and atraumatic.  Mouth/Throat: Oropharynx is clear and moist.  Eyes: Pupils are equal, round, and reactive to light. EOM are normal.  Neck: Normal range of motion.  Neck supple.  Cardiovascular: Normal rate and regular rhythm.  Exam reveals no gallop and no friction rub.   No murmur heard. Pulmonary/Chest: Effort normal and breath sounds normal. No respiratory distress. She has no wheezes. She has no rales. She exhibits tenderness.  Mild right-sided anterior chest wall tenderness to palpation. No crepitus or deformity.  Abdominal: Soft. Bowel sounds are normal. There is no tenderness. There is no rebound and no guarding.  Musculoskeletal: Normal range of motion. She exhibits no edema or tenderness.  No lower extremity swelling, asymmetry or tenderness. Distal pulses are 2+.  Neurological: She is alert and oriented to person, place, and time.  Moves all extremities without focal deficit. Sensation fully intact.  Skin: Skin is warm and dry. No rash noted. No erythema.  Psychiatric: She has a normal mood and affect. Her behavior is normal.  Nursing note and vitals reviewed.    ED Treatments / Results  Labs (all labs ordered are listed, but only abnormal results are displayed) Labs Reviewed  CBC WITH DIFFERENTIAL/PLATELET - Abnormal; Notable for the following:       Result Value   Hemoglobin 10.9 (*)    HCT 34.3 (*)    MCH 25.4 (*)    All other components within normal limits  COMPREHENSIVE METABOLIC PANEL - Abnormal; Notable for the following:    Glucose, Bld 101 (*)    All other components within normal limits  D-DIMER, QUANTITATIVE (NOT AT [facility])  I-STAT TROPONIN, ED  POCT I-STAT TROPONIN I    EKG  EKG Interpretation  Date/Time:  Friday October 15 2016 09:39:29 EDT Ventricular Rate:  81 PR Interval:    QRS Duration: 103 QT Interval:  373 QTC Calculation: 433 R Axis:   41 Text Interpretation:  Sinus rhythm Confirmed by Loren Racer ([id number]) on 10/15/2016 1:21:54 PM       Radiology Dg Chest 2 View  Result Date: 10/15/2016 CLINICAL DATA:  Chest pain EXAM: CHEST  2 VIEW COMPARISON:  December 16, 2015 FINDINGS: The lungs are clear.  Heart size and pulmonary vascularity are normal. No adenopathy. No pneumothorax. No bone lesions. IMPRESSION: No edema or consolidation. Electronically Signed   By: Bretta Bang III M.D.   On: 10/15/2016 10:10    Procedures Procedures (including critical care time)  Medications Ordered in ED Medications - No data to display   Initial Impression / Assessment and Plan / ED Course  I have reviewed the triage vital signs and the nursing notes.  Pertinent labs & imaging results that were available during my care of the patient were reviewed by me and considered in my medical decision making (see chart for details).     Patient remains asymptomatic. Vital signs stable. Workup  is negative including negative d-dimer. Question chest wall etiology. Return precautions given.  Final Clinical Impressions(s) / ED Diagnoses   Final diagnoses:  Chest wall pain    New Prescriptions New Prescriptions   METHOCARBAMOL (ROBAXIN) 500 MG TABLET    Take 2 tablets (1,000 mg total) by mouth every 8 (eight) hours as needed for muscle spasms.     Loren Racer, MD 10/15/16 [phone]

## 2016-10-15 NOTE — ED Notes (Signed)
RN to draw blood when starting IV

## 2016-12-11 ENCOUNTER — Other Ambulatory Visit: Payer: Self-pay

## 2016-12-11 ENCOUNTER — Emergency Department (HOSPITAL_COMMUNITY)
Admission: EM | Admit: 2016-12-11 | Discharge: 2016-12-11 | Disposition: A | Discharge status: Home / Self Care | Payer: PRIVATE HEALTH INSURANCE | Attending: Physician Assistant | Admitting: Physician Assistant

## 2016-12-11 ENCOUNTER — Encounter (HOSPITAL_COMMUNITY): Payer: Self-pay | Admitting: Emergency Medicine

## 2016-12-11 DIAGNOSIS — R509 Fever, unspecified: Secondary | ICD-10-CM | POA: Diagnosis present

## 2016-12-11 DIAGNOSIS — I10 Essential (primary) hypertension: Secondary | ICD-10-CM | POA: Diagnosis not present

## 2016-12-11 DIAGNOSIS — Z79899 Other long term (current) drug therapy: Secondary | ICD-10-CM | POA: Diagnosis not present

## 2016-12-11 DIAGNOSIS — J45909 Unspecified asthma, uncomplicated: Secondary | ICD-10-CM | POA: Insufficient documentation

## 2016-12-11 DIAGNOSIS — J029 Acute pharyngitis, unspecified: Secondary | ICD-10-CM | POA: Insufficient documentation

## 2016-12-11 HISTORY — DX: Essential (primary) hypertension: I10

## 2016-12-11 LAB — RAPID STREP SCREEN (MED CTR MEBANE ONLY): Streptococcus, Group A Screen (Direct): NEGATIVE

## 2016-12-11 MED ORDER — ACETAMINOPHEN 325 MG PO TABS
650.0000 mg | ORAL_TABLET | Freq: Once | ORAL | Status: AC | PRN
  Administered 2016-12-11: 650 mg via ORAL
  Filled 2016-12-11: qty 2

## 2016-12-11 NOTE — ED Notes (Signed)
ED Provider at bedside. 

## 2016-12-11 NOTE — ED Triage Notes (Signed)
C/o sore throat and fever that started tonight.  Took ibuprofen approx 2.5 hours ago.

## 2016-12-11 NOTE — Discharge Instructions (Signed)
Also recommend plain saline nasal spray, continue Tylenol and ibuprofen for fever and any aches, push fluids.

## 2016-12-11 NOTE — ED Provider Notes (Signed)
[facility] Provider Note   CSN: [id number] Arrival date & time: 12/11/16  0009     History   Chief Complaint Chief Complaint  Patient presents with  . Sore Throat  . Fever    HPI Diane Lloyd is a 26 y.o. female.  Patient presents with fever that started as low grade yesterday and rose over time. She has a frontal headache and sore throat as well. No cough. She complains of mild congestion. No nausea or vomiting. Her cousin is at home with similar symptoms.    The history is provided by the patient. No language interpreter was used.    Past Medical History:  Diagnosis Date  . Anemia    iron def  . Asthma   . Hypertension   . Lupus    + ANA in 2012  . Obesity, unspecified   . PCOS (polycystic ovarian syndrome)     Patient Active Problem List   Diagnosis Date Noted  . Vaginal candidiasis 12/18/2015  . Diarrhea 11/10/2015  . Gastroenteritis 09/24/2015  . Screen for STD (sexually transmitted disease) 09/21/2015  . Rash and nonspecific skin eruption 08/31/2015  . Dysmenorrhea 08/31/2015  . Allergic rhinitis 05/17/2013  . Lupus   . GERD (gastroesophageal reflux disease) 03/20/2012  . Polycystic ovarian syndrome 12/26/2011  . Family history of cardiac disorder 02/17/2011  . Contraception management 12/30/2010  . 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 Essential hypertension 01/27/2010  . ANEMIA, IRON DEFICIENCY 06/27/2009  . Obesity 03/10/2006  . ASTHMA, INTERMITTENT 03/10/2006  . ECZEMA, ATOPIC DERMATITIS 03/10/2006    Past Surgical History:  Procedure Laterality Date  . TONSILLECTOMY      OB History    Gravida Para Term Preterm AB Living   0             SAB TAB Ectopic Multiple Live Births                   Home Medications    Prior to Admission medications   Medication Sig Start Date End Date Taking? Authorizing Provider  ibuprofen (ADVIL,MOTRIN) 200 MG tablet Take 400 mg by mouth  every 6 (six) hours as needed for moderate pain.    [provider]  methocarbamol (ROBAXIN) 500 MG tablet Take 2 tablets (1,000 mg total) by mouth every 8 (eight) hours as needed for muscle spasms. 10/15/16   Loren RacerYelverton, David, MD  naproxen sodium (ANAPROX) 220 MG tablet Take 440 mg by mouth 2 (two) times daily with a meal.    [provider]    Family History Family History  Problem Relation Age of Onset  . Cancer Father 8552       esophagus  . Asthma Father   . Hypertension Mother   . Lupus Mother   . Diabetes Mother   . Cancer Maternal Uncle 40       pancreatic cancer  . Cancer Paternal Grandmother 6160       esophageal    Social History Social History   Tobacco Use  . Smoking status: Never Smoker  . Smokeless tobacco: Never Used  Substance Use Topics  . Alcohol use: Yes    Comment: occasional  . Drug use: No     Allergies   Diflucan [fluconazole]   Review of Systems Review of Systems  Constitutional: Negative for chills and fever.  HENT: Positive for sinus pain and sore throat. Negative for facial swelling and trouble swallowing.  Respiratory: Negative.  Negative for cough.   Cardiovascular: Negative.  Negative for chest pain.  Gastrointestinal: Negative.  Negative for abdominal pain, nausea and vomiting.  Musculoskeletal: Negative.  Negative for myalgias.  Skin: Negative.   Neurological: Positive for headaches.     Physical Exam Updated Vital Signs BP (!) 154/91 (BP Location: Right Arm)   Pulse (!) 117   Temp (!) 101.4 F (38.6 C) (Oral)   Resp 16   LMP 11/21/2016   SpO2 100%   Physical Exam  Constitutional: She appears well-developed and well-nourished.  HENT:  Head: Normocephalic.  Right Ear: Tympanic membrane normal.  Left Ear: Tympanic membrane normal.  Nose: Mucosal edema present. Right sinus exhibits maxillary sinus tenderness. Left sinus exhibits maxillary sinus tenderness.  Mouth/Throat: Uvula is midline and oropharynx is  clear and moist. Mucous membranes are not dry. No oropharyngeal exudate or posterior oropharyngeal erythema.  Neck: Normal range of motion. Neck supple.  Cardiovascular: Normal rate and regular rhythm.  Pulmonary/Chest: Effort normal and breath sounds normal. She has no wheezes. She has no rales.  Abdominal: Soft. Bowel sounds are normal. There is no tenderness. There is no rebound and no guarding.  Musculoskeletal: Normal range of motion.  Neurological: She is alert. No cranial nerve deficit.  Skin: Skin is warm and dry. No rash noted.  Psychiatric: She has a normal mood and affect.     ED Treatments / Results  Labs (all labs ordered are listed, but only abnormal results are displayed) Labs Reviewed  RAPID STREP SCREEN (NOT AT [facility])  CULTURE, GROUP A STREP [facility]    EKG  EKG Interpretation None       Radiology No results found.  Procedures Procedures (including critical care time)  Medications Ordered in ED Medications  acetaminophen (TYLENOL) tablet 650 mg (650 mg Oral Given 12/11/16 0033)     Initial Impression / Assessment and Plan / ED Course  I have reviewed the triage vital signs and the nursing notes.  Pertinent labs & imaging results that were available during my care of the patient were reviewed by me and considered in my medical decision making (see chart for details).     Patient presents with sore throat, frontal and facial headache and fever x 1 day. She is well appearing, otherwise healthy, with negative strep test here. Likely viral illness requiring supportive measures.   Final Clinical Impressions(s) / ED Diagnoses   Final diagnoses:  None   1. Pharyngitis   ED Discharge Orders    None       Elpidio AnisUpstill, Justinn Welter, PA-C 12/11/16 [id number]    Abelino DerrickMackuen, Courteney Lyn, MD 12/11/16 [phone]

## 2016-12-13 LAB — CULTURE, GROUP A STREP (THRC)

## 2017-03-19 ENCOUNTER — Emergency Department (HOSPITAL_COMMUNITY)
Admission: EM | Admit: 2017-03-19 | Discharge: 2017-03-19 | Disposition: A | Discharge status: Home / Self Care | Payer: PRIVATE HEALTH INSURANCE | Attending: Emergency Medicine | Admitting: Emergency Medicine

## 2017-03-19 ENCOUNTER — Encounter (HOSPITAL_COMMUNITY): Payer: Self-pay | Admitting: Emergency Medicine

## 2017-03-19 ENCOUNTER — Emergency Department (HOSPITAL_COMMUNITY): Payer: PRIVATE HEALTH INSURANCE

## 2017-03-19 DIAGNOSIS — M79605 Pain in left leg: Secondary | ICD-10-CM | POA: Diagnosis not present

## 2017-03-19 DIAGNOSIS — I10 Essential (primary) hypertension: Secondary | ICD-10-CM | POA: Insufficient documentation

## 2017-03-19 DIAGNOSIS — R0789 Other chest pain: Secondary | ICD-10-CM | POA: Diagnosis not present

## 2017-03-19 DIAGNOSIS — Z79899 Other long term (current) drug therapy: Secondary | ICD-10-CM | POA: Insufficient documentation

## 2017-03-19 DIAGNOSIS — J45909 Unspecified asthma, uncomplicated: Secondary | ICD-10-CM | POA: Diagnosis not present

## 2017-03-19 LAB — I-STAT BETA HCG BLOOD, ED (MC, WL, AP ONLY): I-stat hCG, quantitative: <5 m[IU]/mL (ref <5)

## 2017-03-19 LAB — D-DIMER, QUANTITATIVE: D-Dimer, Quant: <0.27 ug/mL-FEU (ref 0.00–0.50)

## 2017-03-19 MED ORDER — KETOROLAC TROMETHAMINE 30 MG/ML IJ SOLN
30.0000 mg | Freq: Once | INTRAMUSCULAR | Status: AC
  Administered 2017-03-19: 30 mg via INTRAMUSCULAR
  Filled 2017-03-19: qty 1

## 2017-03-19 NOTE — ED Notes (Signed)
NT to obtain blood

## 2017-03-19 NOTE — ED Provider Notes (Signed)
[facility]-EMERGENCY DEPT Provider Note   CSN: [id number] Arrival date & time: 03/19/17  [id number]     History   Chief Complaint Chief Complaint  Patient presents with  . calf pain  . Chest Pain  . Headache    HPI Diane Lloyd is a 27 y.o. female.  Pt presents to the ED today with left sided calf pain.  Sx have been going on for 2 days.  She had some CP last night, but none now.  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  No new activity.  She is worried about a blood clot.      Past Medical History:  Diagnosis Date  . Anemia    iron def  . Asthma   . Hypertension   . Lupus    + ANA in 2012  . Obesity, unspecified   . PCOS (polycystic ovarian syndrome)     Patient Active Problem List   Diagnosis Date Noted  . Vaginal candidiasis 12/18/2015  . Diarrhea 11/10/2015  . Gastroenteritis 09/24/2015  . Screen for STD (sexually transmitted disease) 09/21/2015  . Rash and nonspecific skin eruption 08/31/2015  . Dysmenorrhea 08/31/2015  . Allergic rhinitis 05/17/2013  . Lupus   . GERD (gastroesophageal reflux disease) 03/20/2012  . Polycystic ovarian syndrome 12/26/2011  . Family history of cardiac disorder 02/17/2011  . Contraception management 12/30/2010  . 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 Essential hypertension 01/27/2010  . ANEMIA, IRON DEFICIENCY 06/27/2009  . Obesity 03/10/2006  . ASTHMA, INTERMITTENT 03/10/2006  . ECZEMA, ATOPIC DERMATITIS 03/10/2006    Past Surgical History:  Procedure Laterality Date  . TONSILLECTOMY      OB History    Gravida Para Term Preterm AB Living   0             SAB TAB Ectopic Multiple Live Births                   Home Medications    Prior to Admission medications   Medication Sig Start Date End Date Taking? Authorizing Provider  acetaminophen (TYLENOL) 500 MG tablet Take 500 mg by mouth every 6 (six) hours as needed for moderate pain.   Yes [provider]    amLODipine (NORVASC) 5 MG tablet Take 5 mg by mouth daily.   Yes [provider]  ibuprofen (ADVIL,MOTRIN) 200 MG tablet Take 400 mg by mouth every 6 (six) hours as needed for moderate pain.   Yes [provider]  methocarbamol (ROBAXIN) 500 MG tablet Take 2 tablets (1,000 mg total) by mouth every 8 (eight) hours as needed for muscle spasms. Patient not taking: Reported on 03/19/2017 10/15/16   Loren RacerYelverton, David, MD    Family History Family History  Problem Relation Age of Onset  . Cancer Father 1952       esophagus  . Asthma Father   . Hypertension Mother   . Lupus Mother   . Diabetes Mother   . Cancer Maternal Uncle 40       pancreatic cancer  . Cancer Paternal Grandmother [id number]       esophageal    Social History Social History   Tobacco Use  . Smoking status: Never Smoker  . Smokeless tobacco: Never Used  Substance Use Topics  . Alcohol use: Yes    Comment: occasional  . Drug use: Yes    Types: Marijuana     Allergies   Diflucan [fluconazole]  Review of Systems Review of Systems  Musculoskeletal:       Left calf pain  All other systems reviewed and are negative.    Physical Exam Updated Vital Signs BP 134/76 (BP Location: Right Arm)   Pulse 76   Temp 98.1 F (36.7 C) (Oral)   Resp 16   Ht 5\' 10"  (1.778 m)   Wt 113.4 kg (250 lb)   SpO2 100%   BMI 35.87 kg/m   Physical Exam  Constitutional: She is oriented to person, place, and time. She appears well-developed and well-nourished.  HENT:  Head: Normocephalic and atraumatic.  Eyes: EOM are normal. Pupils are equal, round, and reactive to light.  Neck: Normal range of motion. Neck supple.  Cardiovascular: Normal rate, regular rhythm, intact distal pulses and normal pulses.  Pulmonary/Chest: Effort normal and breath sounds normal.  Abdominal: Soft. Bowel sounds are normal.  Musculoskeletal:       Right lower leg: Normal.       Left lower leg: Normal. She exhibits tenderness.   Neurological: She is alert and oriented to person, place, and time.  Skin: Skin is warm and dry. Capillary refill takes less than 2 seconds.  Psychiatric: She has a normal mood and affect. Her behavior is normal.  Nursing note and vitals reviewed.    ED Treatments / Results  Labs (all labs ordered are listed, but only abnormal results are displayed) Labs Reviewed  D-DIMER, QUANTITATIVE (NOT AT [facility])  I-STAT BETA HCG BLOOD, ED ([facility], [facility], [facility] ONLY)    EKG  EKG Interpretation  Date/Time:  Saturday March 19 2017 07:32:56 EST Ventricular Rate:  71 PR Interval:    QRS Duration: 102 QT Interval:  378 QTC Calculation: 411 R Axis:   42 Text Interpretation:  Sinus rhythm Baseline wander in lead(s) III Confirmed by Jacalyn Lefevre [phone]) on 03/19/2017 7:37:01 AM       Radiology Dg Chest 2 View  Result Date: 03/19/2017 CLINICAL DATA:  Left chest pain EXAM: CHEST - 2 VIEW COMPARISON:  10/15/2016 FINDINGS: Heart and mediastinal contours are within normal limits. No focal opacities or effusions. No acute bony abnormality. IMPRESSION: No active cardiopulmonary disease. Electronically Signed   By: Charlett Nose M.D.   On: 03/19/2017 08:16    Procedures Procedures (including critical care time)  Medications Ordered in ED Medications  ketorolac (TORADOL) 30 MG/ML injection 30 mg (not administered)     Initial Impression / Assessment and Plan / ED Course  I have reviewed the triage vital signs and the nursing notes.  Pertinent labs & imaging results that were available during my care of the patient were reviewed by me and considered in my medical decision making (see chart for details).    Vitals nl.  Pt's leg is not swollen.  I have low suspicion for DVT.  DDimer neg.  Pt is stable for d/c.  Final Clinical Impressions(s) / ED Diagnoses   Final diagnoses:  Atypical chest pain  Pain of left lower extremity    ED Discharge Orders    None       Jacalyn Lefevre, MD 03/19/17  1036

## 2017-03-19 NOTE — ED Notes (Signed)
Pt to xray at this time.

## 2017-03-19 NOTE — ED Notes (Signed)
Darl PikesSusan RN to obtain IV access

## 2017-03-19 NOTE — ED Triage Notes (Signed)
Patient c/o left calf pain for couple days that is constant. Pt adds that last night at work she had intermittent chest pains that has continued this AM as well as constant frontal headache. denies any n/v, blurred vision.

## 2017-04-11 ENCOUNTER — Other Ambulatory Visit: Payer: Self-pay

## 2017-04-11 ENCOUNTER — Emergency Department (HOSPITAL_COMMUNITY)
Admission: EM | Admit: 2017-04-11 | Discharge: 2017-04-11 | Disposition: A | Discharge status: Home / Self Care | Payer: PRIVATE HEALTH INSURANCE | Attending: Emergency Medicine | Admitting: Emergency Medicine

## 2017-04-11 ENCOUNTER — Encounter (HOSPITAL_COMMUNITY): Payer: Self-pay | Admitting: Emergency Medicine

## 2017-04-11 DIAGNOSIS — I10 Essential (primary) hypertension: Secondary | ICD-10-CM | POA: Insufficient documentation

## 2017-04-11 DIAGNOSIS — Z79899 Other long term (current) drug therapy: Secondary | ICD-10-CM | POA: Insufficient documentation

## 2017-04-11 DIAGNOSIS — J45909 Unspecified asthma, uncomplicated: Secondary | ICD-10-CM | POA: Diagnosis not present

## 2017-04-11 DIAGNOSIS — M79605 Pain in left leg: Secondary | ICD-10-CM | POA: Diagnosis not present

## 2017-04-11 LAB — BASIC METABOLIC PANEL
Anion gap: 9 (ref 5–15)
BUN: 10 mg/dL (ref 6–20)
CO2: 23 mmol/L (ref 22–32)
Calcium: 9.2 mg/dL (ref 8.9–10.3)
Chloride: 107 mmol/L (ref 101–111)
Creatinine, Ser: 0.8 mg/dL (ref 0.44–1.00)
GFR calc Af Amer: >60 mL/min (ref >60)
GFR calc non Af Amer: >60 mL/min (ref >60)
Glucose, Bld: 97 mg/dL (ref 65–99)
Potassium: 4 mmol/L (ref 3.5–5.1)
Sodium: 139 mmol/L (ref 135–145)

## 2017-04-11 LAB — CBC
HCT: 38 % (ref 36.0–46.0)
Hemoglobin: 12.2 g/dL (ref 12.0–15.0)
MCH: 26.6 pg (ref 26.0–34.0)
MCHC: 32.1 g/dL (ref 30.0–36.0)
MCV: 83 fL (ref 78.0–100.0)
Platelets: 324 10*3/uL (ref 150–400)
RBC: 4.58 MIL/uL (ref 3.87–5.11)
RDW: 14.3 % (ref 11.5–15.5)
WBC: 4.3 10*3/uL (ref 4.0–10.5)

## 2017-04-11 LAB — D-DIMER, QUANTITATIVE: D-Dimer, Quant: <0.27 ug/mL-FEU (ref 0.00–0.50)

## 2017-04-11 MED ORDER — KETOROLAC TROMETHAMINE 15 MG/ML IJ SOLN
15.0000 mg | Freq: Once | INTRAMUSCULAR | Status: AC
  Administered 2017-04-11: 15 mg via INTRAVENOUS
  Filled 2017-04-11: qty 1

## 2017-04-11 NOTE — ED Provider Notes (Signed)
[facility]-EMERGENCY DEPT Provider Note   CSN: [id number] Arrival date & time: 04/11/17  0508     History   Chief Complaint Chief Complaint  Patient presents with  . Leg Pain    left    HPI Diane Lloyd is a 27 y.o. female presenting for evaluation of left leg pain.  Patient states she has been having intermittent leg pains over the past several weeks.  She was evaluated the beginning of this month with concerns for blood clot, and d-dimer was negative.  She reports continued posterior left leg pain, which has been constant this morning.  Nothing makes her pain better or worse.  Pain is worse at night, less during the day.  She states she stands on her legs all day long.  She is not sure if her pains at night are associated with days when she is on her feet for longer.  Additionally, patient reports intermittent chest pain.  There is no associated shortness of breath, nausea, vomiting, or diaphoresis.  This does not occur when she is having her leg pain.  This is also been going on for several weeks.  She denies current chest pain.  She denies recent fevers, chills, nausea, vomiting, abdominal pain, urinary symptoms, abnormal bowel movements.  She states she does not drink any water throughout the day, only 2-3 sodas.  She has a history of lupus and PCOS, is waiting for a referral for rheumatology.  No history of heart problems.  She does not smoke cigarettes.  No history of hypertension or diabetes.  No family history of cardiac events.  Patient recently drove to [place], 2 weeks ago.  No recent surgeries, immobilization, trauma, history of blood clot, history of cancer, or OCP use.  HPI  Past Medical History:  Diagnosis Date  . Anemia    iron def  . Asthma   . Hypertension   . Lupus (HCC)    + ANA in 2012  . Obesity, unspecified   . PCOS (polycystic ovarian syndrome)     Patient Active Problem List   Diagnosis Date Noted  . Vaginal candidiasis  12/18/2015  . Diarrhea 11/10/2015  . Gastroenteritis 09/24/2015  . Screen for STD (sexually transmitted disease) 09/21/2015  . Rash and nonspecific skin eruption 08/31/2015  . Dysmenorrhea 08/31/2015  . Allergic rhinitis 05/17/2013  . Lupus (HCC)   . GERD (gastroesophageal reflux disease) 03/20/2012  . Polycystic ovarian syndrome 12/26/2011  . Family history of cardiac disorder 02/17/2011  . Contraception management 12/30/2010  . 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 Essential hypertension 01/27/2010  . ANEMIA, IRON DEFICIENCY 06/27/2009  . Obesity 03/10/2006  . ASTHMA, INTERMITTENT 03/10/2006  . ECZEMA, ATOPIC DERMATITIS 03/10/2006    Past Surgical History:  Procedure Laterality Date  . TONSILLECTOMY       OB History    Gravida  0   Para      Term      Preterm      AB      Living        SAB      TAB      Ectopic      Multiple      Live Births               Home Medications    Prior to Admission medications   Medication Sig Start Date End Date Taking? Authorizing Provider  acetaminophen (TYLENOL) 500 MG tablet Take 500 mg by mouth  every 6 (six) hours as needed for moderate pain.   Yes [provider]  ALPRAZolam Prudy Feeler(XANAX) 0.5 MG tablet Take 0.5 mg by mouth 2 (two) times daily as needed for anxiety. 04/04/17  Yes [provider]  amLODipine (NORVASC) 5 MG tablet Take 5 mg by mouth daily.   Yes [provider]  ibuprofen (ADVIL,MOTRIN) 200 MG tablet Take 400 mg by mouth every 6 (six) hours as needed for moderate pain.   Yes [provider]  methocarbamol (ROBAXIN) 500 MG tablet Take 2 tablets (1,000 mg total) by mouth every 8 (eight) hours as needed for muscle spasms. Patient not taking: Reported on 03/19/2017 10/15/16   Loren RacerYelverton, David, MD    Family History Family History  Problem Relation Age of Onset  . Cancer Father 452       esophagus  . Asthma Father   . Hypertension Mother   . Lupus  Mother   . Diabetes Mother   . Cancer Maternal Uncle 40       pancreatic cancer  . Cancer Paternal Grandmother 7260       esophageal    Social History Social History   Tobacco Use  . Smoking status: Never Smoker  . Smokeless tobacco: Never Used  Substance Use Topics  . Alcohol use: Yes    Comment: occasional  . Drug use: Yes    Types: Marijuana     Allergies   Diflucan [fluconazole]   Review of Systems Review of Systems  Constitutional: Negative for chills and fever.  Respiratory: Negative for cough and shortness of breath.   Cardiovascular: Positive for chest pain (intermittent, not currently).  Gastrointestinal: Negative for abdominal pain, nausea and vomiting.  Genitourinary: Negative for dysuria, frequency and hematuria.  Musculoskeletal: Positive for myalgias.  Skin: Negative for wound.  Allergic/Immunologic: Negative for immunocompromised state.  Neurological: Negative for numbness.  Hematological: Does not bruise/bleed easily.  Psychiatric/Behavioral: Negative for confusion.     Physical Exam Updated Vital Signs BP (!) 147/91 (BP Location: Left Arm)   Pulse 64   Temp 98.5 F (36.9 C) (Oral)   Resp 18   LMP  (LMP Unknown)   SpO2 100%   Physical Exam  Constitutional: She is oriented to person, place, and time. She appears well-developed and well-nourished. No distress.  HENT:  Head: Normocephalic and atraumatic.  Eyes: Pupils are equal, round, and reactive to light. Conjunctivae and EOM are normal.  Neck: Normal range of motion.  Cardiovascular: Normal rate, regular rhythm and intact distal pulses.  Pulmonary/Chest: Effort normal and breath sounds normal. No respiratory distress. She has no wheezes. She exhibits no tenderness.  Abdominal: Soft. She exhibits no distension and no mass. There is no tenderness. There is no guarding.  Musculoskeletal: Normal range of motion.  No obvious swelling, warmth, erythema, or deformity of the left leg.  Mild  tenderness palpation of lower posterior left calf.  Pedal pulses intact bilaterally.  Sensation intact bilaterally.  No tenderness to palpation of the knee or popliteal.  No tenderness to palpation of the right leg.  Neurological: She is alert and oriented to person, place, and time.  Skin: Skin is warm and dry.  Psychiatric: She has a normal mood and affect.  Nursing note and vitals reviewed.    ED Treatments / Results  Labs (all labs ordered are listed, but only abnormal results are displayed) Labs Reviewed  CBC  BASIC METABOLIC PANEL  D-DIMER, QUANTITATIVE (NOT AT [facility])    EKG None  Radiology  No results found.  Procedures Procedures (including critical care time)  Medications Ordered in ED Medications  ketorolac (TORADOL) 15 MG/ML injection 15 mg (15 mg Intravenous Given 04/11/17 0719)     Initial Impression / Assessment and Plan / ED Course  I have reviewed the triage vital signs and the nursing notes.  Pertinent labs & imaging results that were available during my care of the patient were reviewed by me and considered in my medical decision making (see chart for details).     Patient presenting for evaluation of left calf pain.  Has been seen multiple times for the same with negative DVT workups including with a d-dimer and an ultrasound.  Pain today is the same as it has been in the past, however patient is concerned since she recently drove to [place].  She denies current chest pain or shortness of breath.  She would like blood work done today. Will obtain basic labs and ddimer.   Labs reassuring, no significant electrolyte abnormalities.  D-dimer negative.  Discussed findings with patient.  Discussed that, at this time, I do not believe her symptoms are due to a clot or other emergent/life-threatening condition.  Discussed that her chest pain is likely also not due to alcohol and doubt ACS at this time.  Discussed importance of staying well-hydrated.  Follow-up with  primary care as needed.  At this time, patient present for discharge.  Return precautions given.  Patient states she understands and agrees to plan.   Final Clinical Impressions(s) / ED Diagnoses   Final diagnoses:  Left leg pain    ED Discharge Orders    None       Alveria Apley, PA-C 04/11/17 0913    Zadie Rhine, MD 04/12/17 1950

## 2017-04-11 NOTE — ED Triage Notes (Signed)
Pt arriving with left leg pain. Pt states the pain goes down her calf to her toes. Pt reports she was seen for the same about 1.5 weeks ago. Pt also having intermittent chest pains primarily on the right side. Reporting the chest pain as a pressure.

## 2017-04-11 NOTE — Discharge Instructions (Addendum)
Take ibuprofen 3 times a day with meals as needed.  Do not take other anti-inflammatories at the same time open (Advil, Motrin, naproxen, Aleve). You may supplement with Tylenol if you need further pain control. Make sure you are increasing the amount of water you are drinking.  Try rolling out your muscles before going to bed.  Follow up with your primary care doctor if your pain continues.  Return to the ER if you have persistent chest pain with shortness of breath, redness of your leg, or any new or concerning symptoms.

## 2017-04-19 ENCOUNTER — Other Ambulatory Visit: Payer: Self-pay | Admitting: Physician Assistant

## 2017-04-19 DIAGNOSIS — R202 Paresthesia of skin: Secondary | ICD-10-CM

## 2017-04-27 ENCOUNTER — Ambulatory Visit
Admission: RE | Admit: 2017-04-27 | Discharge: 2017-04-27 | Disposition: A | Discharge status: Home / Self Care | Payer: PRIVATE HEALTH INSURANCE | Source: Ambulatory Visit | Attending: Physician Assistant | Admitting: Physician Assistant

## 2017-04-27 DIAGNOSIS — R202 Paresthesia of skin: Secondary | ICD-10-CM

## 2017-04-27 MED ORDER — GADOBENATE DIMEGLUMINE 529 MG/ML IV SOLN
20.0000 mL | Freq: Once | INTRAVENOUS | Status: AC | PRN
  Administered 2017-04-27: 20 mL via INTRAVENOUS

## 2017-05-05 DIAGNOSIS — R7689 Other specified abnormal immunological findings in serum: Secondary | ICD-10-CM

## 2017-05-05 DIAGNOSIS — R768 Other specified abnormal immunological findings in serum: Secondary | ICD-10-CM

## 2017-05-05 HISTORY — DX: Other specified abnormal immunological findings in serum: R76.8

## 2017-05-05 HISTORY — DX: Other specified abnormal immunological findings in serum: R76.89

## 2017-07-12 ENCOUNTER — Emergency Department (HOSPITAL_COMMUNITY): Payer: Self-pay

## 2017-07-12 ENCOUNTER — Other Ambulatory Visit: Payer: Self-pay

## 2017-07-12 ENCOUNTER — Emergency Department (HOSPITAL_COMMUNITY)
Admission: EM | Admit: 2017-07-12 | Discharge: 2017-07-13 | Disposition: A | Discharge status: Home / Self Care | Payer: Self-pay | Attending: Emergency Medicine | Admitting: Emergency Medicine

## 2017-07-12 ENCOUNTER — Encounter (HOSPITAL_COMMUNITY): Payer: Self-pay | Admitting: Emergency Medicine

## 2017-07-12 DIAGNOSIS — J45909 Unspecified asthma, uncomplicated: Secondary | ICD-10-CM | POA: Insufficient documentation

## 2017-07-12 DIAGNOSIS — R0602 Shortness of breath: Secondary | ICD-10-CM | POA: Insufficient documentation

## 2017-07-12 DIAGNOSIS — M79609 Pain in unspecified limb: Secondary | ICD-10-CM

## 2017-07-12 DIAGNOSIS — I10 Essential (primary) hypertension: Secondary | ICD-10-CM | POA: Insufficient documentation

## 2017-07-12 DIAGNOSIS — Z79899 Other long term (current) drug therapy: Secondary | ICD-10-CM | POA: Insufficient documentation

## 2017-07-12 DIAGNOSIS — F1210 Cannabis abuse, uncomplicated: Secondary | ICD-10-CM | POA: Insufficient documentation

## 2017-07-12 DIAGNOSIS — R42 Dizziness and giddiness: Secondary | ICD-10-CM | POA: Insufficient documentation

## 2017-07-12 DIAGNOSIS — R079 Chest pain, unspecified: Secondary | ICD-10-CM

## 2017-07-12 DIAGNOSIS — M79662 Pain in left lower leg: Secondary | ICD-10-CM | POA: Insufficient documentation

## 2017-07-12 LAB — BASIC METABOLIC PANEL
Anion gap: 9 (ref 5–15)
BUN: 13 mg/dL (ref 6–20)
CO2: 24 mmol/L (ref 22–32)
Calcium: 9.6 mg/dL (ref 8.9–10.3)
Chloride: 106 mmol/L (ref 98–111)
Creatinine, Ser: 0.81 mg/dL (ref 0.44–1.00)
GFR calc Af Amer: >60 mL/min (ref >60)
GFR calc non Af Amer: >60 mL/min (ref >60)
Glucose, Bld: 80 mg/dL (ref 70–99)
Potassium: 3.8 mmol/L (ref 3.5–5.1)
Sodium: 139 mmol/L (ref 135–145)

## 2017-07-12 LAB — CBC WITH DIFFERENTIAL/PLATELET
Basophils Absolute: 0 10*3/uL (ref 0.0–0.1)
Basophils Relative: 0 %
Eosinophils Absolute: 0.1 10*3/uL (ref 0.0–0.7)
Eosinophils Relative: 1 %
HCT: 36.7 % (ref 36.0–46.0)
Hemoglobin: 11.7 g/dL — ABNORMAL LOW (ref 12.0–15.0)
Lymphocytes Relative: 35 %
Lymphs Abs: 2.1 10*3/uL (ref 0.7–4.0)
MCH: 26.4 pg (ref 26.0–34.0)
MCHC: 31.9 g/dL (ref 30.0–36.0)
MCV: 82.8 fL (ref 78.0–100.0)
Monocytes Absolute: 0.3 10*3/uL (ref 0.1–1.0)
Monocytes Relative: 5 %
Neutro Abs: 3.6 10*3/uL (ref 1.7–7.7)
Neutrophils Relative %: 59 %
Platelets: 359 10*3/uL (ref 150–400)
RBC: 4.43 MIL/uL (ref 3.87–5.11)
RDW: 14.4 % (ref 11.5–15.5)
WBC: 6.1 10*3/uL (ref 4.0–10.5)

## 2017-07-12 LAB — I-STAT TROPONIN, ED: Troponin i, poc: 0 ng/mL (ref 0.00–0.08)

## 2017-07-12 LAB — I-STAT BETA HCG BLOOD, ED (MC, WL, AP ONLY): I-stat hCG, quantitative: <5 m[IU]/mL (ref <5)

## 2017-07-12 LAB — D-DIMER, QUANTITATIVE: D-Dimer, Quant: 0.3 ug/mL-FEU (ref 0.00–0.50)

## 2017-07-12 MED ORDER — ACETAMINOPHEN 325 MG PO TABS
650.0000 mg | ORAL_TABLET | Freq: Once | ORAL | Status: AC
  Administered 2017-07-13: 650 mg via ORAL
  Filled 2017-07-12: qty 2

## 2017-07-12 MED ORDER — ENOXAPARIN SODIUM 120 MG/0.8ML ~~LOC~~ SOLN
115.0000 mg | Freq: Once | SUBCUTANEOUS | Status: AC
  Administered 2017-07-12: 115 mg via SUBCUTANEOUS
  Filled 2017-07-12: qty 0.77

## 2017-07-12 NOTE — Discharge Instructions (Addendum)
IMPORTANT PATIENT INSTRUCTIONS:   You have been scheduled for an Outpatient Vascular Study at [facility].    If tomorrow is a Saturday or Sunday, please go to the [facility] Emergency Department registration desk at 8 AM tomorrow morning and tell them you are therefore a vascular study.  If tomorrow is a weekday (Monday - Friday), please go to the [facility] Admitting Department at 8 AM and tell them you are therefore a vascular study   Additionally, please follow up with your primary care provider within 5-7 days for re-evaluation of your symptoms. If you do not have a primary care provider, information for a healthcare clinic has been provided for you to make arrangements for follow up care. Please return to the emergency department for any new or worsening symptoms.

## 2017-07-12 NOTE — ED Provider Notes (Addendum)
[facility]-EMERGENCY DEPT Provider Note   CSN: [id number] Arrival date & time: 07/12/17  2022     History   Chief Complaint Chief Complaint  Patient presents with  . Chest Pain  . Leg Pain    left calf    HPI Diane Lloyd is a 27 y.o. female.  HPI   Pt is a 27 year old female with history of anemia, asthma, hypertension, obesity, PCOS, who presents to the emergency department today to be evaluated for chest pain and bilateral leg pain which began 3 days ago.  Patient states that she was in a car for an extended period of time 4 to 5 days ago.  She drove 5.5 hours five days ago and 5 hours on four days ago.  After this car ride she developed bilateral calf pain.  Her right lower extremity pain has since resolved and now she is only endorsing pain to the left lower extremity.  No swelling, erythema or redness to the bilateral extremities.  She also states that after she had this extended period of travel she developed bilateral chest pressure which feels like a heaviness in her chest.  Symptoms are intermittent.  They last several minutes at a time and resolve on their own. No specific exacerbating or alleviating factors. Pain has since resolved and she no longer has any pain.    She has no pain with inspiration.  She has intermittent shortness of breath.  She states she feels intermittently lightheaded when she is walking.  She denies any, hemoptysis, recent surgery/trauma,  hormone use, personal hx of cancer, or hx of DVT/PE.  No recent fevers.  No recent URI symptoms.  She states that she has not taken her blood pressure medications today.  Past Medical History:  Diagnosis Date  . Anemia    iron def  . Asthma   . Hypertension   . Lupus (HCC)    + ANA in 2012  . Obesity, unspecified   . PCOS (polycystic ovarian syndrome)     Patient Active Problem List   Diagnosis Date Noted  . Vaginal candidiasis 12/18/2015  . Diarrhea 11/10/2015  .  Gastroenteritis 09/24/2015  . Screen for STD (sexually transmitted disease) 09/21/2015  . Rash and nonspecific skin eruption 08/31/2015  . Dysmenorrhea 08/31/2015  . Allergic rhinitis 05/17/2013  . Lupus (HCC)   . GERD (gastroesophageal reflux disease) 03/20/2012  . Polycystic ovarian syndrome 12/26/2011  . Family history of cardiac disorder 02/17/2011  . Contraception management 12/30/2010  . 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 Essential hypertension 01/27/2010  . ANEMIA, IRON DEFICIENCY 06/27/2009  . Obesity 03/10/2006  . ASTHMA, INTERMITTENT 03/10/2006  . ECZEMA, ATOPIC DERMATITIS 03/10/2006    Past Surgical History:  Procedure Laterality Date  . TONSILLECTOMY       OB History    Gravida  0   Para      Term      Preterm      AB      Living        SAB      TAB      Ectopic      Multiple      Live Births               Home Medications    Prior to Admission medications   Medication Sig Start Date End Date Taking? Authorizing Provider  amLODipine (NORVASC) 5 MG tablet Take 5 mg by mouth daily.   Yes  [provider]  ibuprofen (ADVIL,MOTRIN) 200 MG tablet Take 400 mg by mouth every 6 (six) hours as needed for moderate pain.   Yes [provider]  naproxen sodium (ALEVE) 220 MG tablet Take 440 mg by mouth daily as needed (pain).   Yes [provider]    Family History Family History  Problem Relation Age of Onset  . Cancer Father 552       esophagus  . Asthma Father   . Hypertension Mother   . Lupus Mother   . Diabetes Mother   . Cancer Maternal Uncle 40       pancreatic cancer  . Cancer Paternal Grandmother 6560       esophageal    Social History Social History   Tobacco Use  . Smoking status: Never Smoker  . Smokeless tobacco: Never Used  Substance Use Topics  . Alcohol use: Yes    Comment: occasional  . Drug use: Yes    Types: Marijuana     Allergies   Diflucan  [fluconazole]   Review of Systems Review of Systems  Constitutional: Negative for fever.  HENT: Negative for congestion.   Eyes: Negative for visual disturbance.  Respiratory: Positive for shortness of breath. Negative for cough.   Cardiovascular: Positive for chest pain. Negative for palpitations and leg swelling.  Gastrointestinal: Negative for abdominal pain, constipation, diarrhea, nausea and vomiting.  Genitourinary: Negative for flank pain.  Musculoskeletal: Negative for back pain.       BLE pain  Skin: Negative for wound.  Neurological: Positive for light-headedness. Negative for dizziness, weakness, numbness and headaches.   Physical Exam Updated Vital Signs BP (!) 134/92 (BP Location: Right Arm)   Pulse 61   Temp 98.9 F (37.2 C) (Oral)   Resp 15   Ht 5\' 10"  (1.778 m)   Wt 112.9 kg (249 lb)   LMP  (LMP Unknown)   SpO2 100%   BMI 35.73 kg/m   Physical Exam  Constitutional: She appears well-developed and well-nourished.  Non-toxic appearance. She does not appear ill. No distress.  HENT:  Head: Normocephalic and atraumatic.  Eyes: Pupils are equal, round, and reactive to light. Conjunctivae and EOM are normal.  Neck: Neck supple.  Cardiovascular: Normal rate, regular rhythm and normal pulses.  No murmur heard. Pulmonary/Chest: Effort normal and breath sounds normal. No respiratory distress. She has no decreased breath sounds. She has no wheezes. She has no rhonchi. She has no rales.  No chest wall TTP  Abdominal: Soft. Bowel sounds are normal. She exhibits no distension. There is no tenderness. There is no guarding.  Musculoskeletal: She exhibits no edema.       Right lower leg: Normal. She exhibits no tenderness and no edema.       Left lower leg: Normal. She exhibits tenderness. She exhibits no edema.  Neurological: She is alert.  Skin: Skin is warm and dry. Capillary refill takes less than 2 seconds.  Psychiatric: She has a normal mood and affect.  Nursing  note and vitals reviewed.  ED Treatments / Results  Labs (all labs ordered are listed, but only abnormal results are displayed) Labs Reviewed  CBC WITH DIFFERENTIAL/PLATELET - Abnormal; Notable for the following components:      Result Value   Hemoglobin 11.7 (*)    All other components within normal limits  BASIC METABOLIC PANEL  D-DIMER, QUANTITATIVE (NOT AT [facility])  I-STAT TROPONIN, ED  I-STAT BETA HCG BLOOD, ED ([facility], [facility], [facility] ONLY)  EKG EKG Interpretation  Date/Time:  Tuesday July 12 2017 20:42:19 EDT Ventricular Rate:  62 PR Interval:    QRS Duration: 107 QT Interval:  408 QTC Calculation: 415 R Axis:   38 Text Interpretation:  Sinus arrhythmia Confirmed by Benjiman Core [phone]) on 07/12/2017 11:05:46 PM   Radiology Dg Chest 2 View  Result Date: 07/12/2017 CLINICAL DATA:  Acute chest pain for several days. EXAM: CHEST - 2 VIEW COMPARISON:  03/19/2017 and prior chest radiographs FINDINGS: The cardiomediastinal silhouette is unremarkable. There is no evidence of focal airspace disease, pulmonary edema, suspicious pulmonary nodule/mass, pleural effusion, or pneumothorax. No acute bony abnormalities are identified. IMPRESSION: No active cardiopulmonary disease. Electronically Signed   By: Harmon Pier M.D.   On: 07/12/2017 23:37    Procedures Procedures (including critical care time)  Medications Ordered in ED Medications  enoxaparin (LOVENOX) injection 115 mg (115 mg Subcutaneous Given 07/12/17 2300)     Initial Impression / Assessment and Plan / ED Course  I have reviewed the triage vital signs and the nursing notes.  Pertinent labs & imaging results that were available during my care of the patient were reviewed by me and considered in my medical decision making (see chart for details).     Final Clinical Impressions(s) / ED Diagnoses   Final diagnoses:  Pain in extremity, unspecified extremity  Chest pain, unspecified type   Patient presented to ED with chest  pain and bilateral lower extremity pain after she had an extended period of travel last week.  She is initially tachycardic to 109.  Otherwise her vital signs are stable in the ED.  On exam she has left calf tenderness.  No erythema or edema.  No calf tenderness erythema or edema on the right.  Her cardiac exam is benign.  There are no murmurs.  She has a regular rate and rhythm.  Pulmonary exam is within normal limits.  History most concerning for DVT/PE. Less likely ACS, pedicarditis, myocarditis, or other cardiopulmonary etiology. [place] not currently at [facility] and pt given information for outpatient ultrasound of the bilat lower extremities tomorrow morning. She was given prophylactic dose of lovenox in the ED.   Cbc with mild anemia to 11.7 which is relatively at baseline for her given her h/o anemia. BMP, beta hcg, troponin are all WNL.  ECG shows sinus arrhythmia with HR 62. CXR negative for any cardiopulmonary disease. Ddimer negative. As above, patient was given a prophylactic dose of Lovenox in the ED.  She was advised to follow-up tomorrow for ultrasounds of the bilateral lower extremities to rule out DVT.  She was given strict return precautions for any new or worsening symptoms in the meantime.  All questions are answered and patient understands plan reasons to return immediately to the ED.  ED Discharge Orders        Ordered    LE VENOUS     07/12/17 2237       Karrie Meres, PA-C 07/12/17 2313    Rayne Du 07/12/17 2358    Benjiman Core, MD 07/13/17 [phone]

## 2017-07-12 NOTE — ED Triage Notes (Signed)
Pt presenting with left calf pain and bilateral chest pressure that began 3 days ago. Shortness of breath and dizziness. Denies N/V.

## 2017-07-13 ENCOUNTER — Ambulatory Visit (HOSPITAL_COMMUNITY)
Admission: RE | Admit: 2017-07-13 | Discharge: 2017-07-13 | Disposition: A | Discharge status: Home / Self Care | Payer: Self-pay | Source: Ambulatory Visit | Attending: Physician Assistant | Admitting: Physician Assistant

## 2017-07-13 DIAGNOSIS — M79661 Pain in right lower leg: Secondary | ICD-10-CM | POA: Insufficient documentation

## 2017-07-13 DIAGNOSIS — M79662 Pain in left lower leg: Secondary | ICD-10-CM | POA: Insufficient documentation

## 2017-07-13 DIAGNOSIS — M79609 Pain in unspecified limb: Secondary | ICD-10-CM

## 2017-07-13 NOTE — Progress Notes (Signed)
Preliminary results by tech - Venous Duplex Lower Ext. Completed. Negative for deep vein thrombosis in both legs. Adith Tejada, BS, RDMS, RVT    

## 2017-08-27 ENCOUNTER — Inpatient Hospital Stay (HOSPITAL_COMMUNITY)
Admission: AD | Admit: 2017-08-27 | Discharge: 2017-08-27 | Disposition: A | Discharge status: Home / Self Care | Payer: Self-pay | Source: Ambulatory Visit | Attending: Obstetrics and Gynecology | Admitting: Obstetrics and Gynecology

## 2017-08-27 ENCOUNTER — Encounter (HOSPITAL_COMMUNITY): Payer: Self-pay | Admitting: *Deleted

## 2017-08-27 DIAGNOSIS — E282 Polycystic ovarian syndrome: Secondary | ICD-10-CM | POA: Insufficient documentation

## 2017-08-27 DIAGNOSIS — Z113 Encounter for screening for infections with a predominantly sexual mode of transmission: Secondary | ICD-10-CM

## 2017-08-27 DIAGNOSIS — Z3202 Encounter for pregnancy test, result negative: Secondary | ICD-10-CM | POA: Insufficient documentation

## 2017-08-27 DIAGNOSIS — N939 Abnormal uterine and vaginal bleeding, unspecified: Secondary | ICD-10-CM | POA: Insufficient documentation

## 2017-08-27 DIAGNOSIS — N926 Irregular menstruation, unspecified: Secondary | ICD-10-CM | POA: Insufficient documentation

## 2017-08-27 DIAGNOSIS — I10 Essential (primary) hypertension: Secondary | ICD-10-CM | POA: Insufficient documentation

## 2017-08-27 LAB — URINALYSIS, ROUTINE W REFLEX MICROSCOPIC: Glucose, UA: NEGATIVE mg/dL

## 2017-08-27 LAB — CBC WITH DIFFERENTIAL/PLATELET
Basophils Absolute: 0 10*3/uL (ref 0.0–0.1)
Basophils Relative: 0 %
Eosinophils Absolute: 0.1 10*3/uL (ref 0.0–0.7)
Eosinophils Relative: 1 %
HCT: 32.2 % — ABNORMAL LOW (ref 36.0–46.0)
Hemoglobin: 10.1 g/dL — ABNORMAL LOW (ref 12.0–15.0)
Lymphocytes Relative: 27 %
Lymphs Abs: 2 10*3/uL (ref 0.7–4.0)
MCH: 26.6 pg (ref 26.0–34.0)
MCHC: 31.4 g/dL (ref 30.0–36.0)
MCV: 84.7 fL (ref 78.0–100.0)
Monocytes Absolute: 0.5 10*3/uL (ref 0.1–1.0)
Monocytes Relative: 7 %
Neutro Abs: 4.6 10*3/uL (ref 1.7–7.7)
Neutrophils Relative %: 65 %
Platelets: 276 10*3/uL (ref 150–400)
RBC: 3.8 MIL/uL — ABNORMAL LOW (ref 3.87–5.11)
RDW: 15.1 % (ref 11.5–15.5)
WBC: 7.2 10*3/uL (ref 4.0–10.5)

## 2017-08-27 LAB — WET PREP, GENITAL
Clue Cells Wet Prep HPF POC: NONE SEEN
Sperm: NONE SEEN
Trich, Wet Prep: NONE SEEN
Yeast Wet Prep HPF POC: NONE SEEN

## 2017-08-27 LAB — URINALYSIS, MICROSCOPIC (REFLEX)

## 2017-08-27 LAB — POCT PREGNANCY, URINE: Preg Test, Ur: NEGATIVE

## 2017-08-27 MED ORDER — IBUPROFEN 600 MG PO TABS
600.0000 mg | ORAL_TABLET | Freq: Once | ORAL | Status: AC
  Administered 2017-08-27: 600 mg via ORAL
  Filled 2017-08-27: qty 1

## 2017-08-27 MED ORDER — AMLODIPINE BESYLATE 5 MG PO TABS: 5.0000 mg | ORAL_TABLET | Freq: Every day | ORAL | 0 refills | Status: DC

## 2017-08-27 NOTE — MAU Note (Signed)
Pt is having heavy bleeding and cramping, went to the GYN and was given provera, was worsening and they put her on BCP. Just started those yesterday. Pain is 8/10.

## 2017-08-27 NOTE — Discharge Instructions (Signed)

## 2017-08-27 NOTE — MAU Provider Note (Addendum)
History     CSN: [id number]  Arrival date and time: 08/27/17 1658   None     Chief Complaint  Patient presents with  . Vaginal Bleeding  . Abdominal Pain   Patient presented to the MAU for heavy vaginal bleeding soaking a pad every 6930min-1hr. Patient has a history of PCOS and abnormal uterine bleeding. She frequently has spotting, prolonged bleeding, and heavy vaginal bleeding. This episode of bleeding started on Saturday. [facility] was [facility] and given Provera, but her bleeding only increased instead of decreasing. Then they put her on an oral combination birth control pill. She took one dose yesterday but did not note a reduction in symptoms. She has passed some small clots as well and is complaining of menstrual like cramping. In the MAU, she verbalized her bleeding had been decreased from what it was before. A moderate amount of blood was noted on her pad after roughly one hour but it was not saturated. Patient has chronic hypertension and verbalizes her blood pressure is elevated in the MAU today because she needs to pick up her blood pressure medication from her pharmacy and has not been taking her antihypertensives recently.  Pt states had not had a menses in one year and does not use condoms or birth control on a regular basis.  Does have a new sexual partner.  Pt states started Sprintec 35 for past two days.  Still passing some small clots.  Not taking BP medication.  Does not know where it is.    Pertinent Gynecological History: Menses: patient has diagnosis of PCOS, she has irregular spotting and irregular heavy bleeding  without a normal menses.  Bleeding: intermenstrual bleeding and dysfunctional uterine bleeding   Past Medical History:  Diagnosis Date  . Anemia    iron def  . Asthma   . Hypertension   . Lupus (HCC)    + ANA in 2012  . Obesity, unspecified   . PCOS (polycystic ovarian syndrome)     Past Surgical History:  Procedure Laterality Date  .  TONSILLECTOMY      Family History  Problem Relation Age of Onset  . Cancer Father [id number]       esophagus  . Asthma Father   . Hypertension Mother   . Lupus Mother   . Diabetes Mother   . Cancer Maternal Uncle 40       pancreatic cancer  . Cancer Paternal Grandmother 6960       esophageal    Social History   Tobacco Use  . Smoking status: Never Smoker  . Smokeless tobacco: Never Used  Substance Use Topics  . Alcohol use: Yes    Comment: occasional  . Drug use: Not Currently    Types: Marijuana    Allergies:  Allergies  Allergen Reactions  . Diflucan [Fluconazole] Itching and Rash    Medications Prior to Admission  Medication Sig Dispense Refill Last Dose  . ibuprofen (ADVIL,MOTRIN) 200 MG tablet Take 400 mg by mouth every 6 (six) hours as needed for moderate pain.   Past Week at Unknown time  . naproxen sodium (ALEVE) 220 MG tablet Take 440 mg by mouth daily as needed (pain).   07/12/2017 at Unknown time  . [DISCONTINUED] amLODipine (NORVASC) 5 MG tablet Take 5 mg by mouth daily.   Past Month at Unknown time    Review of Systems  Genitourinary: Positive for vaginal bleeding.       Menstrual-like cramping   All other  systems reviewed and are negative.  Physical Exam   Vitals:   08/27/17 1718 08/27/17 2003  BP: (!) 155/82 (!) 148/87  Pulse: 100 83  Resp: 16   Temp: 98.4 F (36.9 C)   TempSrc: Oral   SpO2: 99%   Weight: 112.5 kg   Height: 5\' 10"  (1.778 m)    Vitals:   08/27/17 1718 08/27/17 2003  BP: (!) 155/82 (!) 148/87  Pulse: 100 83  Resp: 16   Temp: 98.4 F (36.9 C)   SpO2: 99%      Physical Exam  Constitutional: She is oriented to person, place, and time. She appears well-developed and well-nourished.  HENT:  Head: Normocephalic.  Eyes: Pupils are equal, round, and reactive to light.  Cardiovascular: Normal rate, regular rhythm and normal heart sounds.  Respiratory: Effort normal.  GI: Soft. There is no tenderness.  Genitourinary: There is  bleeding in the vagina.  Musculoskeletal: Normal range of motion.  Neurological: She is alert and oriented to person, place, and time.  Skin: Skin is warm and dry.  Psychiatric: She has a normal mood and affect. Her behavior is normal. Judgment and thought content normal.  Pelvic exam.  BUS wnl, vagina normal rugae, no lesions.  Small clots noted in vagina.  Cervix no lesions or polyps.   Bimanual uterus normal size nontender. Right and left adnexa wnl.  MAU Course  Procedures Vital signs reviewed CBC Results for orders placed or performed during the hospital encounter of 08/27/17 (from the past 24 hour(s))  Urinalysis, Routine w reflex microscopic     Status: Abnormal   Collection Time: 08/27/17  5:16 PM  Result Value Ref Range   Color, Urine RED (A) YELLOW   APPearance TURBID (A) CLEAR   Specific Gravity, Urine  1.005 - 1.030    TEST NOT REPORTED DUE TO COLOR INTERFERENCE OF URINE PIGMENT   pH  5.0 - 8.0    TEST NOT REPORTED DUE TO COLOR INTERFERENCE OF URINE PIGMENT   Glucose, UA NEGATIVE NEGATIVE mg/dL   Hgb urine dipstick (A) NEGATIVE    TEST NOT REPORTED DUE TO COLOR INTERFERENCE OF URINE PIGMENT   Bilirubin Urine (A) NEGATIVE    TEST NOT REPORTED DUE TO COLOR INTERFERENCE OF URINE PIGMENT   Ketones, ur (A) NEGATIVE mg/dL    TEST NOT REPORTED DUE TO COLOR INTERFERENCE OF URINE PIGMENT   Protein, ur (A) NEGATIVE mg/dL    TEST NOT REPORTED DUE TO COLOR INTERFERENCE OF URINE PIGMENT   Nitrite (A) NEGATIVE    TEST NOT REPORTED DUE TO COLOR INTERFERENCE OF URINE PIGMENT   Leukocytes, UA (A) NEGATIVE    TEST NOT REPORTED DUE TO COLOR INTERFERENCE OF URINE PIGMENT  Urinalysis, Microscopic (reflex)     Status: Abnormal   Collection Time: 08/27/17  5:16 PM  Result Value Ref Range   RBC / HPF 21-50 0 - 5 RBC/hpf   WBC, UA 0-5 0 - 5 WBC/hpf   Bacteria, UA FEW (A) NONE SEEN   Squamous Epithelial / LPF 0-5 0 - 5  Pregnancy, urine POC     Status: None   Collection Time: 08/27/17   5:35 PM  Result Value Ref Range   Preg Test, Ur NEGATIVE NEGATIVE  CBC with Differential/Platelet     Status: Abnormal   Collection Time: 08/27/17  6:42 PM  Result Value Ref Range   WBC 7.2 4.0 - 10.5 K/uL   RBC 3.80 (L) 3.87 - 5.11 MIL/uL   Hemoglobin 10.1 (  L) 12.0 - 15.0 g/dL   HCT 08.632.2 (L) 57.836.0 - 46.946.0 %   MCV 84.7 78.0 - 100.0 fL   MCH 26.6 26.0 - 34.0 pg   MCHC 31.4 30.0 - 36.0 g/dL   RDW 62.915.1 52.811.5 - 41.315.5 %   Platelets 276 150 - 400 K/uL   Neutrophils Relative % 65 %   Neutro Abs 4.6 1.7 - 7.7 K/uL   Lymphocytes Relative 27 %   Lymphs Abs 2.0 0.7 - 4.0 K/uL   Monocytes Relative 7 %   Monocytes Absolute 0.5 0.1 - 1.0 K/uL   Eosinophils Relative 1 %   Eosinophils Absolute 0.1 0.0 - 0.7 K/uL   Basophils Relative 0 %   Basophils Absolute 0.0 0.0 - 0.1 K/uL  Wet prep, genital     Status: Abnormal   Collection Time: 08/27/17  8:01 PM  Result Value Ref Range   Yeast Wet Prep HPF POC NONE SEEN NONE SEEN   Trich, Wet Prep NONE SEEN NONE SEEN   Clue Cells Wet Prep HPF POC NONE SEEN NONE SEEN   WBC, Wet Prep HPF POC FEW (A) NONE SEEN   Sperm NONE SEEN    MDM Patient reports ongoing abnormal uterine bleeding and heavy irregular bleeding related to PCOS. Bleeding had previously been heavy but was diminished in MAU. CBC is pending to assess for anemia. Patient's vitals indicate patient is hemodynamically stable. Consulted Dr. Sallye OberKulwa. Giving 600mg  Ibuprofen to treat cramping. Considering discharging with Tranexemic acid 1,300mg  TID for 5 days if patient continues to have heavy intractable bleeding but is stable for discharge. Report given to Bernerd PhoNancy Prothero, CNM who will take over care for this patient.  Reviewed CBC with pt and how to take birth control.  Discussed taking BP medication and dangers of ocp with hypertension.  Will give her 30 day supply of norvasc.  Will see Pcp.  Will send wet mount and gc/chlamydia.  Discussed option of Mirena IUD for control of PCOS and birth  control. Assessment and Plan  27 y.o. Gyn patient of [facility] PCOS Abnormal uterine bleeding/heavy irregular bleeding Hypertension  Henderson Newcomerancy Jean Prothero 08/27/2017, 8:56 PM

## 2017-08-29 LAB — GC/CHLAMYDIA PROBE AMP (~~LOC~~) NOT AT ARMC
Chlamydia: NEGATIVE
Neisseria Gonorrhea: NEGATIVE

## 2017-09-23 ENCOUNTER — Encounter (HOSPITAL_COMMUNITY): Payer: Self-pay | Admitting: *Deleted

## 2017-09-23 ENCOUNTER — Inpatient Hospital Stay (HOSPITAL_COMMUNITY)
Admission: AD | Admit: 2017-09-23 | Discharge: 2017-09-23 | Disposition: A | Discharge status: Home / Self Care | Payer: Self-pay | Source: Ambulatory Visit | Attending: Obstetrics & Gynecology | Admitting: Obstetrics & Gynecology

## 2017-09-23 DIAGNOSIS — E669 Obesity, unspecified: Secondary | ICD-10-CM | POA: Insufficient documentation

## 2017-09-23 DIAGNOSIS — Z8269 Family history of other diseases of the musculoskeletal system and connective tissue: Secondary | ICD-10-CM | POA: Insufficient documentation

## 2017-09-23 DIAGNOSIS — I10 Essential (primary) hypertension: Secondary | ICD-10-CM | POA: Insufficient documentation

## 2017-09-23 DIAGNOSIS — N920 Excessive and frequent menstruation with regular cycle: Secondary | ICD-10-CM | POA: Insufficient documentation

## 2017-09-23 DIAGNOSIS — Z883 Allergy status to other anti-infective agents status: Secondary | ICD-10-CM | POA: Insufficient documentation

## 2017-09-23 DIAGNOSIS — E282 Polycystic ovarian syndrome: Secondary | ICD-10-CM | POA: Insufficient documentation

## 2017-09-23 DIAGNOSIS — Z6835 Body mass index (BMI) 35.0-35.9, adult: Secondary | ICD-10-CM | POA: Insufficient documentation

## 2017-09-23 DIAGNOSIS — N921 Excessive and frequent menstruation with irregular cycle: Secondary | ICD-10-CM

## 2017-09-23 DIAGNOSIS — Z8249 Family history of ischemic heart disease and other diseases of the circulatory system: Secondary | ICD-10-CM | POA: Insufficient documentation

## 2017-09-23 DIAGNOSIS — Z825 Family history of asthma and other chronic lower respiratory diseases: Secondary | ICD-10-CM | POA: Insufficient documentation

## 2017-09-23 DIAGNOSIS — Z833 Family history of diabetes mellitus: Secondary | ICD-10-CM | POA: Insufficient documentation

## 2017-09-23 LAB — URINALYSIS, ROUTINE W REFLEX MICROSCOPIC
Bacteria, UA: NONE SEEN
Bilirubin Urine: NEGATIVE
Glucose, UA: NEGATIVE mg/dL
Ketones, ur: NEGATIVE mg/dL
Leukocytes, UA: NEGATIVE
Nitrite: NEGATIVE
Protein, ur: NEGATIVE mg/dL
RBC / HPF: >50 RBC/hpf — ABNORMAL HIGH (ref 0–5)
Specific Gravity, Urine: 1.015 (ref 1.005–1.030)
pH: 6 (ref 5.0–8.0)

## 2017-09-23 LAB — POCT PREGNANCY, URINE: Preg Test, Ur: NEGATIVE

## 2017-09-23 NOTE — Discharge Instructions (Signed)
Dysfunctional Uterine Bleeding °Dysfunctional uterine bleeding is abnormal bleeding from the uterus. Dysfunctional uterine bleeding includes: °· A period that comes earlier or later than usual. °· A period that is lighter, heavier, or has blood clots. °· Bleeding between periods. °· Skipping one or more periods. °· Bleeding after sexual intercourse. °· Bleeding after menopause. ° °Follow these instructions at home: °Pay attention to any changes in your symptoms. Follow these instructions to help with your condition: °Eating and drinking °· Eat well-balanced meals. Include foods that are high in iron, such as liver, meat, shellfish, green leafy vegetables, and eggs. °· If you become constipated: °? Drink plenty of water. °? Eat fruits and vegetables that are high in water and fiber, such as spinach, carrots, raspberries, apples, and mango. °Medicines °· Take over-the-counter and prescription medicines only as told by your health care provider. °· Do not change medicines without talking with your health care provider. °· Aspirin or medicines that contain aspirin may make the bleeding worse. Do not take those medicines: °? During the week before your period. °? During your period. °· If you were prescribed iron pills, take them as told by your health care provider. Iron pills help to replace iron that your body loses because of this condition. °Activity °· If you need to change your sanitary pad or tampon more than one time every 2 hours: °? Lie in bed with your feet raised (elevated). °? Place a cold pack on your lower abdomen. °? Rest as much as possible until the bleeding stops or slows down. °· Do not try to lose weight until the bleeding has stopped and your blood iron level is back to normal. °Other Instructions °· For two months, write down: °? When your period starts. °? When your period ends. °? When any abnormal bleeding occurs. °? What problems you notice. °· Keep all follow up visits as told by your health  care provider. This is important. °Contact a health care provider if: °· You get light-headed or weak. °· You have nausea and vomiting. °· You cannot eat or drink without vomiting. °· You feel dizzy or have diarrhea while you are taking medicines. °· You are taking birth control pills or hormones, and you want to change them or stop taking them. °Get help right away if: °· You develop a fever or chills. °· You need to change your sanitary pad or tampon more than one time per hour. °· Your bleeding becomes heavier, or your flow contains clots more often. °· You develop pain in your abdomen. °· You lose consciousness. °· You develop a rash. °This information is not intended to replace advice given to you by your health care provider. Make sure you discuss any questions you have with your health care provider. °Document Released: 12/26/1999 Document Revised: 06/05/2015 Document Reviewed: 03/25/2014 °Elsevier Interactive Patient Education © 2018 [employer] ° °

## 2017-09-23 NOTE — MAU Note (Signed)
PT SAYS SHE HAS BEEN  BLEEDING  SINCE July.       WAS  HERE LAST MTH- LABS,    CONTINUED  BCP- BLEEDING CONTINUES. CRAMPS  WORSE IN SEPT.

## 2017-09-23 NOTE — MAU Provider Note (Signed)
Chief Complaint:  Abdominal Pain   None    HPI: Diane Lloyd is a 27 y.o. G0P0 at Unknown who presents to maternity admissions reporting menorrhagia.  Was was seen last month for same problem.  Hgb was 10.1.  On ocps, for pcos and states taking.  Pt has hx of HTN and was given a month of Norvasc and encouraged to see PCP for BP control.  Pt did not fill prescription and is not taking medication.  Location: vaginal bleeding Quality: mod Severity: 2/10 in pain scale Duration: on and off for month Modifying factors: has not done any otc Associated signs and symptoms: cramping   Pregnancy Course:   Past Medical History:  Diagnosis Date  . Anemia    iron def  . Asthma   . Hypertension   . Lupus (HCC)    + ANA in 2012  . Obesity, unspecified   . PCOS (polycystic ovarian syndrome)    OB History  Gravida Para Term Preterm AB Living  0            SAB TAB Ectopic Multiple Live Births              Past Surgical History:  Procedure Laterality Date  . TONSILLECTOMY     Family History  Problem Relation Age of Onset  . Cancer Father 39       esophagus  . Asthma Father   . Hypertension Mother   . Lupus Mother   . Diabetes Mother   . Cancer Maternal Uncle 40       pancreatic cancer  . Cancer Paternal Grandmother 8       esophageal   Social History   Tobacco Use  . Smoking status: Never Smoker  . Smokeless tobacco: Never Used  Substance Use Topics  . Alcohol use: Yes    Comment: occasional  . Drug use: Not Currently    Types: Marijuana    Comment: LAST TIME- 2 YEARS AGO   Allergies  Allergen Reactions  . Diflucan [Fluconazole] Itching and Rash   Medications Prior to Admission  Medication Sig Dispense Refill Last Dose  . amLODipine (NORVASC) 5 MG tablet Take 1 tablet (5 mg total) by mouth daily. 30 tablet 0 Past Month at Unknown time  . ibuprofen (ADVIL,MOTRIN) 200 MG tablet Take 400 mg by mouth every 6 (six) hours as needed for moderate pain.   09/22/2017 at  Unknown time    I have reviewed patient's Past Medical Hx, Surgical Hx, Family Hx, Social Hx, medications and allergies.   ROS:  Review of Systems  Constitutional: Positive for fatigue.  HENT: Negative.   Eyes: Negative.   Respiratory: Negative.   Cardiovascular: Negative.   Gastrointestinal: Negative.   Endocrine: Negative.   Genitourinary: Positive for vaginal bleeding.  Musculoskeletal: Negative.   Skin: Negative.   Allergic/Immunologic: Negative.   Neurological: Negative.   Hematological: Negative.   Psychiatric/Behavioral: Negative.     Physical Exam   Patient Vitals for the past 24 hrs:  BP Temp Pulse Resp  09/23/17 0331 (!) 162/98 98.9 F (37.2 C) 89 20   Constitutional: Well-developed, well-nourished female in no acute distress.  Cardiovascular: normal rate Respiratory: normal effort GI: Abd soft, non-tender, gravid appropriate for gestational age. Pos BS x 4 MS: Extremities nontender, no edema, normal ROM Neurologic: Alert and oriented x 4.  GU: Neg CVAT.  Pelvic: NEFG, physiologic discharge, small amount of blood, cervix clean. No CMT      Labs: Results  for orders placed or performed during the hospital encounter of 09/23/17 (from the past 24 hour(s))  Pregnancy, urine POC     Status: None   Collection Time: 09/23/17  3:14 AM  Result Value Ref Range   Preg Test, Ur NEGATIVE NEGATIVE    Imaging:  No results found.  MAU Course: Orders Placed This Encounter  Procedures  . Urinalysis, Routine w reflex microscopic  . Pregnancy, urine POC   No orders of the defined types were placed in this encounter.   MDM: PE and UPT and speculum Assessment: Menorrhagia Chronic hypertension  Plan: Discharge home in stable condition.  States picking up blood pressure medication today.  Will make an appt in office to discuss options for BP management poss IUD.   Kenney Housemanrothero, Nancy Jean, CNM 09/23/2017 3:38 AM

## 2017-10-26 ENCOUNTER — Ambulatory Visit: Payer: PRIVATE HEALTH INSURANCE | Admitting: Podiatry

## 2017-10-26 ENCOUNTER — Encounter: Payer: Self-pay | Admitting: Podiatry

## 2017-10-26 DIAGNOSIS — L03031 Cellulitis of right toe: Secondary | ICD-10-CM | POA: Diagnosis not present

## 2017-10-26 DIAGNOSIS — L600 Ingrowing nail: Secondary | ICD-10-CM

## 2017-10-26 NOTE — Patient Instructions (Signed)

## 2017-10-26 NOTE — Progress Notes (Signed)
Subjective:   Patient ID: Diane Lloyd, female   DOB: 27 y.o.   MRN: [id number]   HPI Patient presents stating that she traumatized her left big toenail and its been loose and is been sore and she notices an odor.  States it started about a week ago and it lifted and it was an acrylic nail   ROS      Objective:  Physical Exam  Neurovascular status intact with patient's left hallux nail lifting with inflammation noted and it is moderately tender and it is just adhered at the base     Assessment:  Damaged left hallux nail with acrylic top that may have mild localized infection     Plan:  H&P condition reviewed infiltrated the left hallux 60 mg like Marcaine mixture and under sterile conditions remove the nail flush the bed did not note any acute drainage applied sterile dressing and instructed on soaks

## 2017-12-11 ENCOUNTER — Encounter (HOSPITAL_COMMUNITY): Payer: Self-pay

## 2017-12-11 ENCOUNTER — Other Ambulatory Visit: Payer: Self-pay

## 2017-12-11 ENCOUNTER — Emergency Department (HOSPITAL_COMMUNITY)
Admission: EM | Admit: 2017-12-11 | Discharge: 2017-12-11 | Disposition: A | Discharge status: Home / Self Care | Payer: Self-pay | Attending: Emergency Medicine | Admitting: Emergency Medicine

## 2017-12-11 DIAGNOSIS — R519 Headache, unspecified: Secondary | ICD-10-CM

## 2017-12-11 DIAGNOSIS — R51 Headache: Secondary | ICD-10-CM | POA: Insufficient documentation

## 2017-12-11 DIAGNOSIS — Z79899 Other long term (current) drug therapy: Secondary | ICD-10-CM | POA: Insufficient documentation

## 2017-12-11 DIAGNOSIS — R140 Abdominal distension (gaseous): Secondary | ICD-10-CM | POA: Insufficient documentation

## 2017-12-11 DIAGNOSIS — J45909 Unspecified asthma, uncomplicated: Secondary | ICD-10-CM | POA: Insufficient documentation

## 2017-12-11 DIAGNOSIS — I10 Essential (primary) hypertension: Secondary | ICD-10-CM | POA: Insufficient documentation

## 2017-12-11 LAB — COMPREHENSIVE METABOLIC PANEL
ALT: 14 U/L (ref 0–44)
AST: 20 U/L (ref 15–41)
Albumin: 3.7 g/dL (ref 3.5–5.0)
Alkaline Phosphatase: 51 U/L (ref 38–126)
Anion gap: 8 (ref 5–15)
BUN: 9 mg/dL (ref 6–20)
CO2: 25 mmol/L (ref 22–32)
Calcium: 9.1 mg/dL (ref 8.9–10.3)
Chloride: 105 mmol/L (ref 98–111)
Creatinine, Ser: 0.87 mg/dL (ref 0.44–1.00)
GFR calc Af Amer: >60 mL/min (ref >60)
GFR calc non Af Amer: >60 mL/min (ref >60)
Glucose, Bld: 102 mg/dL — ABNORMAL HIGH (ref 70–99)
Potassium: 4 mmol/L (ref 3.5–5.1)
Sodium: 138 mmol/L (ref 135–145)
Total Bilirubin: 0.6 mg/dL (ref 0.3–1.2)
Total Protein: 7.7 g/dL (ref 6.5–8.1)

## 2017-12-11 LAB — LIPASE, BLOOD: Lipase: 29 U/L (ref 11–51)

## 2017-12-11 LAB — I-STAT BETA HCG BLOOD, ED (MC, WL, AP ONLY): I-stat hCG, quantitative: <5 m[IU]/mL (ref <5)

## 2017-12-11 LAB — CBC
HCT: 36.1 % (ref 36.0–46.0)
Hemoglobin: 10.8 g/dL — ABNORMAL LOW (ref 12.0–15.0)
MCH: 23.8 pg — ABNORMAL LOW (ref 26.0–34.0)
MCHC: 29.9 g/dL — ABNORMAL LOW (ref 30.0–36.0)
MCV: 79.7 fL — ABNORMAL LOW (ref 80.0–100.0)
Platelets: 382 10*3/uL (ref 150–400)
RBC: 4.53 MIL/uL (ref 3.87–5.11)
RDW: 16.9 % — ABNORMAL HIGH (ref 11.5–15.5)
WBC: 5.5 10*3/uL (ref 4.0–10.5)
nRBC: 0 % (ref 0.0–0.2)

## 2017-12-11 MED ORDER — METOCLOPRAMIDE HCL 10 MG PO TABS
10.0000 mg | ORAL_TABLET | Freq: Once | ORAL | Status: AC
Start: 2017-12-11 — End: 2017-12-11
  Administered 2017-12-11: 10 mg via ORAL
  Filled 2017-12-11: qty 1

## 2017-12-11 MED ORDER — ALUM & MAG HYDROXIDE-SIMETH 200-200-20 MG/5ML PO SUSP
15.0000 mL | Freq: Once | ORAL | Status: AC
  Administered 2017-12-11: 15 mL via ORAL
  Filled 2017-12-11: qty 30

## 2017-12-11 MED ORDER — DIPHENHYDRAMINE HCL 25 MG PO CAPS
25.0000 mg | ORAL_CAPSULE | Freq: Once | ORAL | Status: AC
  Administered 2017-12-11: 25 mg via ORAL
  Filled 2017-12-11: qty 1

## 2017-12-11 MED ORDER — FAMOTIDINE 20 MG PO TABS: 20.0000 mg | ORAL_TABLET | Freq: Two times a day (BID) | ORAL | 0 refills | Status: DC

## 2017-12-11 MED ORDER — FAMOTIDINE 20 MG PO TABS
20.0000 mg | ORAL_TABLET | Freq: Once | ORAL | Status: AC
  Administered 2017-12-11: 20 mg via ORAL
  Filled 2017-12-11: qty 1

## 2017-12-11 NOTE — ED Provider Notes (Signed)
[facility]-EMERGENCY DEPT Provider Note   CSN: [id number] Arrival date & time: 12/11/17  1857     History   Chief Complaint Chief Complaint  Patient presents with  . Headache  . Abdominal Pain    HPI Diane Lloyd is a 27 y.o. female with past medical history of asthma, hypertension, iron deficiency anemia, presenting the emergency department with complaint of gradual onset of bilateral headache that began this morning.  Pain is dull with some associated mild nausea without vomiting.  She states it feels the same as her last headache which is about 2 weeks ago, however it is in a different location.  She took Aleve without much help.  Noticed associated vision changes, no fevers, no neck pain.  States she was out of her blood pressure medication for 3 days, however did fill it today. Pt has a second complaint of abdominal bloating the past 5 days.  She states her belly feels very full and bloated after eating.  She complains of intermittent epigastric cramping as well.  States she normally eats very big meals, which consist of a lot of fast food, beef, chicken, and sweets.  She states last week she felt like she had some mild constipation, treated with a laxative and then she had diarrhea. Normal most recently. No urinary sx, no vaginal bleeding or discharge. Endorses hx of GERD, though not treating with antacids. No hx of PUD.   The history is provided by the patient.    Past Medical History:  Diagnosis Date  . Anemia    iron def  . Asthma   . Hypertension   . Lupus (HCC)    + ANA in 2012  . Obesity, unspecified   . PCOS (polycystic ovarian syndrome)     Patient Active Problem List   Diagnosis Date Noted  . Vaginal candidiasis 12/18/2015  . Diarrhea 11/10/2015  . Gastroenteritis 09/24/2015  . Screen for STD (sexually transmitted disease) 09/21/2015  . Rash and nonspecific skin eruption 08/31/2015  . Dysmenorrhea 08/31/2015  . Allergic rhinitis  05/17/2013  . Lupus (HCC)   . GERD (gastroesophageal reflux disease) 03/20/2012  . Polycystic ovarian syndrome 12/26/2011  . Family history of cardiac disorder 02/17/2011  . Contraception management 12/30/2010  . 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 Essential hypertension 01/27/2010  . ANEMIA, IRON DEFICIENCY 06/27/2009  . Obesity 03/10/2006  . ASTHMA, INTERMITTENT 03/10/2006  . ECZEMA, ATOPIC DERMATITIS 03/10/2006    Past Surgical History:  Procedure Laterality Date  . TONSILLECTOMY       OB History    Gravida  0   Para      Term      Preterm      AB      Living        SAB      TAB      Ectopic      Multiple      Live Births               Home Medications    Prior to Admission medications   Medication Sig Start Date End Date Taking? Authorizing Provider  amLODipine (NORVASC) 5 MG tablet Take 1 tablet (5 mg total) by mouth daily. 08/27/17  Yes Prothero, Henderson Newcomer, CNM  ibuprofen (ADVIL,MOTRIN) 200 MG tablet Take 400 mg by mouth every 6 (six) hours as needed for moderate pain.   Yes [provider]  naproxen sodium (ALEVE) 220 MG tablet Take 440  mg by mouth 2 (two) times daily as needed (pain).   Yes [provider]  SPRINTEC 28 0.25-35 MG-MCG tablet Take 1 tablet by mouth daily.  08/26/17  Yes [provider]  famotidine (PEPCID) 20 MG tablet Take 1 tablet (20 mg total) by mouth 2 (two) times daily. 12/11/17   Deirdra Heumann, [place] N, PA-C    Family History Family History  Problem Relation Age of Onset  . Cancer Father 82       esophagus  . Asthma Father   . Hypertension Mother   . Lupus Mother   . Diabetes Mother   . Cancer Maternal Uncle 40       pancreatic cancer  . Cancer Paternal Grandmother 66       esophageal    Social History Social History   Tobacco Use  . Smoking status: Never Smoker  . Smokeless tobacco: Never Used  Substance Use Topics  . Alcohol use: Yes    Comment: occasional    . Drug use: Not Currently    Types: Marijuana    Comment: LAST TIME- 2 YEARS AGO     Allergies   Diflucan [fluconazole]   Review of Systems Review of Systems  Constitutional: Negative for fever.  Eyes: Negative for photophobia and visual disturbance.  Gastrointestinal: Positive for abdominal distention, abdominal pain and nausea. Negative for vomiting.  Genitourinary: Negative for dysuria, frequency, vaginal bleeding and vaginal discharge.  Neurological: Positive for headaches.  All other systems reviewed and are negative.    Physical Exam Updated Vital Signs BP (!) 145/89   Pulse 76   Temp 99 F (37.2 C) (Oral)   Resp 16   Ht 5\' 10"  (1.778 m)   Wt 111.1 kg   LMP 11/15/2017 (Within Weeks)   SpO2 100%   BMI 35.15 kg/m   Physical Exam  Constitutional: She is oriented to person, place, and time. She appears well-developed and well-nourished. She does not appear ill. No distress.  HENT:  Head: Normocephalic and atraumatic.  Mouth/Throat: Oropharynx is clear and moist.  Eyes: Conjunctivae are normal.  Neck: Normal range of motion. Neck supple.  No nuchal rigidity  Cardiovascular: Normal rate, regular rhythm and normal heart sounds.  Pulmonary/Chest: Effort normal and breath sounds normal. No respiratory distress.  Abdominal: Soft. Bowel sounds are normal. She exhibits no distension and no mass. There is no tenderness. There is no rebound and no guarding.  Neurological: She is alert and oriented to person, place, and time.  Mental Status:  Alert, oriented, thought content appropriate, able to give a coherent history. Speech fluent without evidence of aphasia. Able to follow 2 step commands without difficulty.  Cranial Nerves:  II:  Peripheral visual fields grossly normal, pupils equal, round, reactive to light III,IV, VI: ptosis not present, extra-ocular motions intact bilaterally  V,VII: smile symmetric, facial light touch sensation equal VIII: hearing grossly normal  to voice  X: uvula elevates symmetrically  XI: bilateral shoulder shrug symmetric and strong XII: midline tongue extension without fassiculations Motor:  Normal tone. 5/5 in upper and lower extremities bilaterally including strong and equal grip strength and dorsiflexion/plantar flexion Sensory: Pinprick and light touch normal in all extremities.  Deep Tendon Reflexes: 2+ and symmetric in the biceps and patella Cerebellar: normal finger-to-nose with bilateral upper extremities Gait: normal gait and balance CV: distal pulses palpable throughout    Skin: Skin is warm.  Psychiatric: She has a normal mood and affect. Her behavior is normal.  Nursing note and vitals  reviewed.    ED Treatments / Results  Labs (all labs ordered are listed, but only abnormal results are displayed) Labs Reviewed  COMPREHENSIVE METABOLIC PANEL - Abnormal; Notable for the following components:      Result Value   Glucose, Bld 102 (*)    All other components within normal limits  CBC - Abnormal; Notable for the following components:   Hemoglobin 10.8 (*)    MCV 79.7 (*)    MCH 23.8 (*)    MCHC 29.9 (*)    RDW 16.9 (*)    All other components within normal limits  LIPASE, BLOOD  I-STAT BETA HCG BLOOD, ED ([facility], [facility], [facility] ONLY)    EKG None  Radiology No results found.  Procedures Procedures (including critical care time)  Medications Ordered in ED Medications  alum & mag hydroxide-simeth (MAALOX/MYLANTA) 200-200-20 MG/5ML suspension 15 mL (15 mLs Oral Given 12/11/17 2134)  metoCLOPramide (REGLAN) tablet 10 mg (10 mg Oral Given 12/11/17 2133)  diphenhydrAMINE (BENADRYL) capsule 25 mg (25 mg Oral Given 12/11/17 2133)  famotidine (PEPCID) tablet 20 mg (20 mg Oral Given 12/11/17 2133)     Initial Impression / Assessment and Plan / ED Course  I have reviewed the triage vital signs and the nursing notes.  Pertinent labs & imaging results that were available during my care of the patient were reviewed by  me and considered in my medical decision making (see chart for details).    Pt presenting with HA and post-prandial abdominal bloating/discomfort. Pt states HA quality is like her typical HA, just located towards the top of her head instead of the front. Presentation is not [facility], ICH, Meningitis, or temporal arteritis. Pt is afebrile with no focal neuro deficits, nuchal rigidity, or change in vision. Pt missed 3 days og BP medications, however BP is not significantly elevated, no signs or symptoms of end organ damage. Abdomen is soft and nontender. Labs are reassuring. Hbg is up from last check. No leukocytosis. CMP is reassuring. Hcg neg. Suspect abdominal symptoms are largely diet related. Discussed diet modifications and antacids. Recommend PCP follow up. Headache and dyspepsia treated. Pt verbalizes understanding and is agreeable with plan to dc.   Discussed results, findings, treatment and follow up. Patient advised of return precautions. Patient verbalized understanding and agreed with plan.  Final Clinical Impressions(s) / ED Diagnoses   Final diagnoses:  Bad headache  Abdominal bloating    ED Discharge Orders         Ordered    famotidine (PEPCID) 20 MG tablet  2 times daily     12/11/17 2151           Tiny Rietz, [place] N, PA-C 12/11/17 2328    Melene PlanFloyd, Dan, DO 12/12/17 0018

## 2017-12-11 NOTE — ED Triage Notes (Signed)
Pt reports upper abdominal pain and abdominal bloating x5 days. She also reports that she woke up with a headache this morning that she could not relieve with Aleve. Pt reports that she has been out her amlodipine for the last 3 days. Endorses N/D, no vomiting. LMP approx 11/5.

## 2017-12-11 NOTE — Discharge Instructions (Signed)
Please read instructions below. You can take Tylenol every 4 hours as needed for headache. Take Pepcid daily to help with your stomach upset.  Recommended that you decrease your fast food intake, spicy and acidic foods as this all can irritate your stomach and may likely be attributing to your symptoms.  Limit your use of NSAIDs, including Advil/ibuprofen/Motrin, Aleve, BC powder, Goody's powder, aspirin. Schedule an appointment with your primary care provider to follow up on your visit today and discuss preventative treatment foryour headache. Return to the ER for severely worsening headache, vision changes, fever, weakness or numbness, or new or concerning symptoms.

## 2017-12-13 ENCOUNTER — Other Ambulatory Visit: Payer: Self-pay

## 2017-12-13 ENCOUNTER — Emergency Department (HOSPITAL_COMMUNITY)
Admission: EM | Admit: 2017-12-13 | Discharge: 2017-12-13 | Disposition: A | Discharge status: Home / Self Care | Payer: Medicaid Other | Attending: Emergency Medicine | Admitting: Emergency Medicine

## 2017-12-13 ENCOUNTER — Encounter (HOSPITAL_COMMUNITY): Payer: Self-pay | Admitting: *Deleted

## 2017-12-13 DIAGNOSIS — R51 Headache: Secondary | ICD-10-CM | POA: Insufficient documentation

## 2017-12-13 DIAGNOSIS — Z5321 Procedure and treatment not carried out due to patient leaving prior to being seen by health care provider: Secondary | ICD-10-CM | POA: Insufficient documentation

## 2017-12-13 NOTE — ED Notes (Signed)
No answer from lobby  

## 2017-12-13 NOTE — ED Triage Notes (Signed)
Pt reports a headache for several days, was seen on Dec 1 for same. Has started back on BP meds.

## 2017-12-15 ENCOUNTER — Emergency Department (HOSPITAL_BASED_OUTPATIENT_CLINIC_OR_DEPARTMENT_OTHER): Payer: Medicaid Other

## 2017-12-15 ENCOUNTER — Emergency Department (HOSPITAL_BASED_OUTPATIENT_CLINIC_OR_DEPARTMENT_OTHER)
Admission: EM | Admit: 2017-12-15 | Discharge: 2017-12-15 | Disposition: A | Discharge status: Home / Self Care | Payer: Medicaid Other | Attending: Emergency Medicine | Admitting: Emergency Medicine

## 2017-12-15 ENCOUNTER — Other Ambulatory Visit: Payer: Self-pay

## 2017-12-15 ENCOUNTER — Encounter (HOSPITAL_BASED_OUTPATIENT_CLINIC_OR_DEPARTMENT_OTHER): Payer: Self-pay | Admitting: Emergency Medicine

## 2017-12-15 DIAGNOSIS — I10 Essential (primary) hypertension: Secondary | ICD-10-CM | POA: Insufficient documentation

## 2017-12-15 DIAGNOSIS — R519 Headache, unspecified: Secondary | ICD-10-CM

## 2017-12-15 DIAGNOSIS — R51 Headache: Secondary | ICD-10-CM | POA: Insufficient documentation

## 2017-12-15 DIAGNOSIS — J45909 Unspecified asthma, uncomplicated: Secondary | ICD-10-CM | POA: Insufficient documentation

## 2017-12-15 LAB — PREGNANCY, URINE: Preg Test, Ur: NEGATIVE

## 2017-12-15 MED ORDER — DIPHENHYDRAMINE HCL 50 MG/ML IJ SOLN
25.0000 mg | Freq: Once | INTRAMUSCULAR | Status: AC
  Administered 2017-12-15: 25 mg via INTRAVENOUS
  Filled 2017-12-15: qty 1

## 2017-12-15 MED ORDER — LACTATED RINGERS IV BOLUS
1000.0000 mL | Freq: Once | INTRAVENOUS | Status: AC
  Administered 2017-12-15: 1000 mL via INTRAVENOUS

## 2017-12-15 MED ORDER — AMLODIPINE BESYLATE 5 MG PO TABS
5.0000 mg | ORAL_TABLET | Freq: Once | ORAL | Status: AC
  Administered 2017-12-15: 5 mg via ORAL
  Filled 2017-12-15: qty 1

## 2017-12-15 MED ORDER — METOCLOPRAMIDE HCL 5 MG/ML IJ SOLN
10.0000 mg | Freq: Once | INTRAMUSCULAR | Status: AC
  Administered 2017-12-15: 10 mg via INTRAVENOUS
  Filled 2017-12-15: qty 2

## 2017-12-15 MED ORDER — METHYLPREDNISOLONE SODIUM SUCC 125 MG IJ SOLR
125.0000 mg | Freq: Once | INTRAMUSCULAR | Status: AC
  Administered 2017-12-15: 125 mg via INTRAVENOUS
  Filled 2017-12-15: qty 2

## 2017-12-15 NOTE — ED Notes (Signed)
Patient transported to CT 

## 2017-12-15 NOTE — ED Notes (Signed)
Unsuccessful IV x1

## 2017-12-15 NOTE — ED Provider Notes (Signed)
Emergency Department Provider Note   I have reviewed the triage vital signs and the nursing notes.   HISTORY  Chief Complaint Headache   HPI AEON KOORS is a 27 y.o. female who is here with 4 days a headache.  She is been here and seen here once and seen by primary doctor once without improvement.  He has no neurologic symptoms.  No fever.  No neck pain.  Similar to previous ones.  Not necessarily made worse with light. No other associated or modifying symptoms.    Past Medical History:  Diagnosis Date  . Anemia    iron def  . Asthma   . Hypertension   . Lupus (HCC)    + ANA in 2012  . Obesity, unspecified   . PCOS (polycystic ovarian syndrome)     Patient Active Problem List   Diagnosis Date Noted  . Vaginal candidiasis 12/18/2015  . Diarrhea 11/10/2015  . Gastroenteritis 09/24/2015  . Screen for STD (sexually transmitted disease) 09/21/2015  . Rash and nonspecific skin eruption 08/31/2015  . Dysmenorrhea 08/31/2015  . Allergic rhinitis 05/17/2013  . Lupus (HCC)   . GERD (gastroesophageal reflux disease) 03/20/2012  . Polycystic ovarian syndrome 12/26/2011  . Family history of cardiac disorder 02/17/2011  . Contraception management 12/30/2010  . 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 Essential hypertension 01/27/2010  . ANEMIA, IRON DEFICIENCY 06/27/2009  . Obesity 03/10/2006  . ASTHMA, INTERMITTENT 03/10/2006  . ECZEMA, ATOPIC DERMATITIS 03/10/2006    Past Surgical History:  Procedure Laterality Date  . TONSILLECTOMY      Current Outpatient Rx  . Order #: [id number] Class: Normal  . Order #: [id number] Class: Print  . Order #: [id number] Class: Historical Med  . Order #: [id number] Class: Historical Med  . Order #: [id number] Class: Historical Med    Allergies Diflucan [fluconazole]  Family History  Problem Relation Age of Onset  . Cancer Father 41       esophagus  . Asthma Father   . Hypertension Mother   . Lupus Mother     . Diabetes Mother   . Cancer Maternal Uncle 40       pancreatic cancer  . Cancer Paternal Grandmother 49       esophageal    Social History Social History   Tobacco Use  . Smoking status: Never Smoker  . Smokeless tobacco: Never Used  Substance Use Topics  . Alcohol use: Yes    Comment: occasional  . Drug use: Not Currently    Types: Marijuana    Comment: LAST TIME- 2 YEARS AGO    Review of Systems  All other systems negative except as documented in the HPI. All pertinent positives and negatives as reviewed in the HPI. ____________________________________________   PHYSICAL EXAM:  VITAL SIGNS: ED Triage Vitals  Enc Vitals Group     BP 12/15/17 1811 (!) 170/94     Pulse Rate 12/15/17 1811 (!) 102     Resp 12/15/17 1811 16     Temp 12/15/17 1811 98.5 F (36.9 C)     Temp Source 12/15/17 1811 Oral     SpO2 12/15/17 1811 100 %     Weight 12/15/17 1809 247 lb (112 kg)     Height 12/15/17 1809 5\' 10"  (1.778 m)    Constitutional: Alert and oriented. Well appearing and in no acute distress. Eyes: Conjunctivae are normal. PERRL. EOMI. Head: Atraumatic. Nose: No congestion/rhinnorhea. Mouth/Throat: Mucous membranes are moist.  Oropharynx non-erythematous. Neck: No stridor.  No meningeal signs.   Cardiovascular: Normal rate, regular rhythm. Good peripheral circulation. Grossly normal heart sounds.   Respiratory: Normal respiratory effort.  No retractions. Lungs CTAB. Gastrointestinal: Soft and nontender. No distention.  Musculoskeletal: No lower extremity tenderness nor edema. No gross deformities of extremities. Neurologic:  Normal speech and language. No gross focal neurologic deficits are appreciated. No altered mental status, able to give full seemingly accurate history.  Face is symmetric, EOM's intact, pupils equal and reactive, vision intact, tongue and uvula midline without deviation. Upper and Lower extremity motor 5/5, intact pain perception in distal  extremities, 2+ reflexes in biceps, patella and achilles tendons. Able to perform finger to nose normal with both hands. Walks without assistance or evident ataxia.   Skin:  Skin is warm, dry and intact. No rash noted.   ____________________________________________   LABS (all labs ordered are listed, but only abnormal results are displayed)  Labs Reviewed  PREGNANCY, URINE   ____________________________________________  RADIOLOGY  Ct Head Wo Contrast  Result Date: 12/15/2017 CLINICAL DATA:  Headache and pressure x4 days EXAM: CT HEAD WITHOUT CONTRAST TECHNIQUE: Contiguous axial images were obtained from the base of the skull through the vertex without intravenous contrast. COMPARISON:  04/27/2017 MRI FINDINGS: BRAIN: The ventricles and sulci are normal. No intraparenchymal hemorrhage, mass effect nor midline shift. No acute large vascular territory infarcts. Grey-white matter distinction is maintained. The basal ganglia are unremarkable. No abnormal extra-axial fluid collections. Basal cisterns are not effaced and midline. The brainstem and cerebellar hemispheres are without acute abnormalities. VASCULAR: Unremarkable. SKULL/SOFT TISSUES: No skull fracture. No significant soft tissue swelling. ORBITS/SINUSES: The included ocular globes and orbital contents are normal.The mastoid air cells are clear. The included paranasal sinuses are well-aerated. OTHER: None. IMPRESSION: Normal head CT. Electronically Signed   By: Tollie Ethavid  Kwon M.D.   On: 12/15/2017 18:57    ____________________________________________   PROCEDURES  Procedure(s) performed:   Procedures   ____________________________________________   INITIAL IMPRESSION / ASSESSMENT AND PLAN / ED COURSE  Unclear etiology for the cause of her headaches but will follow-up with neurology as already scheduled.  Otherwise follow-up with PCP.  CT negative.   Pertinent labs & imaging results that were available during my care of the  patient were reviewed by me and considered in my medical decision making (see chart for details).  ____________________________________________  FINAL CLINICAL IMPRESSION(S) / ED DIAGNOSES  Final diagnoses:  Nonintractable headache, unspecified chronicity pattern, unspecified headache type     MEDICATIONS GIVEN DURING THIS VISIT:  Medications  metoCLOPramide (REGLAN) injection 10 mg (10 mg Intravenous Given 12/15/17 1857)  lactated ringers bolus 1,000 mL (0 mLs Intravenous Stopped 12/15/17 2106)  diphenhydrAMINE (BENADRYL) injection 25 mg (25 mg Intravenous Given 12/15/17 1857)  methylPREDNISolone sodium succinate (SOLU-MEDROL) 125 mg/2 mL injection 125 mg (125 mg Intravenous Given 12/15/17 1857)  amLODipine (NORVASC) tablet 5 mg (5 mg Oral Given 12/15/17 1845)     NEW OUTPATIENT MEDICATIONS STARTED DURING THIS VISIT:  Discharge Medication List as of 12/15/2017  8:11 PM      Note:  This note was prepared with assistance of Dragon voice recognition software. Occasional wrong-word or sound-a-like substitutions may have occurred due to the inherent limitations of voice recognition software.   Kayonna Lawniczak, Barbara CowerJason, MD 12/16/17 [phone]

## 2017-12-15 NOTE — ED Triage Notes (Signed)
H/A x4 days . [facility] and then a walk in clinic

## 2017-12-17 ENCOUNTER — Other Ambulatory Visit: Payer: Self-pay

## 2017-12-17 ENCOUNTER — Encounter (HOSPITAL_COMMUNITY): Payer: Self-pay

## 2017-12-17 ENCOUNTER — Emergency Department (HOSPITAL_COMMUNITY)
Admission: EM | Admit: 2017-12-17 | Discharge: 2017-12-17 | Disposition: A | Discharge status: Home / Self Care | Payer: Medicaid Other | Attending: Emergency Medicine | Admitting: Emergency Medicine

## 2017-12-17 DIAGNOSIS — J4520 Mild intermittent asthma, uncomplicated: Secondary | ICD-10-CM | POA: Insufficient documentation

## 2017-12-17 DIAGNOSIS — I10 Essential (primary) hypertension: Secondary | ICD-10-CM

## 2017-12-17 DIAGNOSIS — R51 Headache: Secondary | ICD-10-CM | POA: Insufficient documentation

## 2017-12-17 DIAGNOSIS — Z79899 Other long term (current) drug therapy: Secondary | ICD-10-CM | POA: Insufficient documentation

## 2017-12-17 MED ORDER — HYDROCHLOROTHIAZIDE 12.5 MG PO TABS: 12.5000 mg | ORAL_TABLET | Freq: Every day | ORAL | 0 refills | Status: DC

## 2017-12-17 NOTE — ED Provider Notes (Addendum)
[facility]-EMERGENCY DEPT Provider Note   CSN: [id number] Arrival date & time: 12/17/17  [id number]     History   Chief Complaint Chief Complaint  Patient presents with  . Headache  . Hypertension    HPI Diane Lloyd is a 27 y.o. female.  27 year old female with history of hypertension who presents with bitemporal headache times several days.  Seen here 2 days ago for similar symptoms and had a negative head CT.  Patient had a blood pressure medications adjusted.  Denies any visual changes, emesis, weakness in her arms or legs.  No fever or neck pain.  No chest discomfort or shortness of breath.  Headache is absent in the morning but gets worse throughout the day.  Denies any temporal pain.  Patient has been medicating with Aleve with temporary results.  Was supposed to see her primary care doctor yesterday but was late for the appointment.  She has been rescheduled to see her on Monday.     Past Medical History:  Diagnosis Date  . Anemia    iron def  . Asthma   . Hypertension   . Lupus (HCC)    + ANA in 2012  . Obesity, unspecified   . PCOS (polycystic ovarian syndrome)     Patient Active Problem List   Diagnosis Date Noted  . Vaginal candidiasis 12/18/2015  . Diarrhea 11/10/2015  . Gastroenteritis 09/24/2015  . Screen for STD (sexually transmitted disease) 09/21/2015  . Rash and nonspecific skin eruption 08/31/2015  . Dysmenorrhea 08/31/2015  . Allergic rhinitis 05/17/2013  . Lupus (HCC)   . GERD (gastroesophageal reflux disease) 03/20/2012  . Polycystic ovarian syndrome 12/26/2011  . Family history of cardiac disorder 02/17/2011  . Contraception management 12/30/2010  . 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 Essential hypertension 01/27/2010  . ANEMIA, IRON DEFICIENCY 06/27/2009  . Obesity 03/10/2006  . ASTHMA, INTERMITTENT 03/10/2006  . ECZEMA, ATOPIC DERMATITIS 03/10/2006    Past Surgical History:  Procedure  Laterality Date  . TONSILLECTOMY       OB History    Gravida  0   Para      Term      Preterm      AB      Living        SAB      TAB      Ectopic      Multiple      Live Births               Home Medications    Prior to Admission medications   Medication Sig Start Date End Date Taking? Authorizing Provider  amLODipine (NORVASC) 5 MG tablet Take 1 tablet (5 mg total) by mouth daily. 08/27/17  Yes Prothero, Henderson NewcomerNancy Jean, CNM  ibuprofen (ADVIL,MOTRIN) 200 MG tablet Take 400 mg by mouth every 6 (six) hours as needed for moderate pain.   Yes [provider]  SPRINTEC 28 0.25-35 MG-MCG tablet Take 1 tablet by mouth daily.  08/26/17  Yes [provider]    Family History Family History  Problem Relation Age of Onset  . Cancer Father [id number]       esophagus  . Asthma Father   . Hypertension Mother   . Lupus Mother   . Diabetes Mother   . Cancer Maternal Uncle 40       pancreatic cancer  . Cancer Paternal Grandmother 7060       esophageal    Social  History Social History   Tobacco Use  . Smoking status: Never Smoker  . Smokeless tobacco: Never Used  Substance Use Topics  . Alcohol use: Yes    Comment: occasional  . Drug use: Not Currently    Types: Marijuana    Comment: LAST TIME- 2 YEARS AGO     Allergies   Diflucan [fluconazole]   Review of Systems Review of Systems  All other systems reviewed and are negative.    Physical Exam Updated Vital Signs BP (!) 185/80 (BP Location: Left Arm)   Pulse (!) 130   Temp 99.2 F (37.3 C) (Oral)   Resp 20   LMP 12/11/2017 (Exact Date)   SpO2 100%   Physical Exam  Constitutional: She is oriented to person, place, and time. She appears well-developed and well-nourished.  Non-toxic appearance. No distress.  HENT:  Head: Normocephalic and atraumatic.  Eyes: Pupils are equal, round, and reactive to light. Conjunctivae, EOM and lids are normal.  Neck: Normal range of motion. Neck supple.  No tracheal deviation present. No thyroid mass present.  Cardiovascular: Normal rate, regular rhythm and normal heart sounds. Exam reveals no gallop.  No murmur heard. Pulmonary/Chest: Effort normal and breath sounds normal. No stridor. No respiratory distress. She has no decreased breath sounds. She has no wheezes. She has no rhonchi. She has no rales.  Abdominal: Soft. Normal appearance and bowel sounds are normal. She exhibits no distension. There is no tenderness. There is no rebound and no CVA tenderness.  Musculoskeletal: Normal range of motion. She exhibits no edema or tenderness.  Neurological: She is alert and oriented to person, place, and time. She has normal strength. No cranial nerve deficit or sensory deficit. Coordination and gait normal. GCS eye subscore is 4. GCS verbal subscore is 5. GCS motor subscore is 6.  Strength 5 out of 5 throughout  Skin: Skin is warm and dry. No abrasion and no rash noted.  Psychiatric: She has a normal mood and affect. Her speech is normal and behavior is normal.  Nursing note and vitals reviewed.    ED Treatments / Results  Labs (all labs ordered are listed, but only abnormal results are displayed) Labs Reviewed - No data to display  EKG EKG Interpretation  Date/Time:  Saturday December 17 2017 08:49:09 EST Ventricular Rate:  103 PR Interval:    QRS Duration: 96 QT Interval:  351 QTC Calculation: 460 R Axis:   48 Text Interpretation:  Sinus tachycardia Confirmed by Lorre Nick ([id number]) on 12/17/2017 8:51:58 AM   Radiology Ct Head Wo Contrast  Result Date: 12/15/2017 CLINICAL DATA:  Headache and pressure x4 days EXAM: CT HEAD WITHOUT CONTRAST TECHNIQUE: Contiguous axial images were obtained from the base of the skull through the vertex without intravenous contrast. COMPARISON:  04/27/2017 MRI FINDINGS: BRAIN: The ventricles and sulci are normal. No intraparenchymal hemorrhage, mass effect nor midline shift. No acute large vascular  territory infarcts. Grey-white matter distinction is maintained. The basal ganglia are unremarkable. No abnormal extra-axial fluid collections. Basal cisterns are not effaced and midline. The brainstem and cerebellar hemispheres are without acute abnormalities. VASCULAR: Unremarkable. SKULL/SOFT TISSUES: No skull fracture. No significant soft tissue swelling. ORBITS/SINUSES: The included ocular globes and orbital contents are normal.The mastoid air cells are clear. The included paranasal sinuses are well-aerated. OTHER: None. IMPRESSION: Normal head CT. Electronically Signed   By: Tollie Eth M.D.   On: 12/15/2017 18:57    Procedures Procedures (including critical care time)  Medications Ordered in ED  Medications - No data to display   Initial Impression / Assessment and Plan / ED Course  I have reviewed the triage vital signs and the nursing notes.  Pertinent labs & imaging results that were available during my care of the patient were reviewed by me and considered in my medical decision making (see chart for details).     Patient's heart rate noted and states that she always has a high heart rate when she comes to the hospital.  Denies any chest discomfort or palpitations.  Blood work reviewed recently and she had normal renal function 6 days ago.  Will add a diuretic to her current blood pressure and regimen and encouraged her to see her primary care doctor on Monday.  Final Clinical Impressions(s) / ED Diagnoses   Final diagnoses:  None    ED Discharge Orders    None       Lorre Nick, MD 12/17/17 1610    Lorre Nick, MD 12/17/17 [phone]

## 2017-12-17 NOTE — ED Triage Notes (Signed)
She c/o generalized h/a plus tells me she has been having difficulty "controlling my blood pressure". She also states she was seen recently and was told by a provider to double her Amlodipine from 5mg  to 10. She is alert and oriented x 4 with clear speech.

## 2017-12-20 ENCOUNTER — Encounter (HOSPITAL_COMMUNITY): Payer: Self-pay | Admitting: Emergency Medicine

## 2017-12-20 ENCOUNTER — Emergency Department (HOSPITAL_COMMUNITY): Payer: Medicaid Other

## 2017-12-20 ENCOUNTER — Emergency Department (HOSPITAL_COMMUNITY)
Admission: EM | Admit: 2017-12-20 | Discharge: 2017-12-20 | Disposition: A | Discharge status: Home / Self Care | Payer: Medicaid Other | Attending: Emergency Medicine | Admitting: Emergency Medicine

## 2017-12-20 ENCOUNTER — Other Ambulatory Visit: Payer: Self-pay

## 2017-12-20 DIAGNOSIS — I10 Essential (primary) hypertension: Secondary | ICD-10-CM | POA: Insufficient documentation

## 2017-12-20 DIAGNOSIS — R002 Palpitations: Secondary | ICD-10-CM | POA: Insufficient documentation

## 2017-12-20 DIAGNOSIS — Z79899 Other long term (current) drug therapy: Secondary | ICD-10-CM | POA: Insufficient documentation

## 2017-12-20 DIAGNOSIS — J45909 Unspecified asthma, uncomplicated: Secondary | ICD-10-CM | POA: Insufficient documentation

## 2017-12-20 LAB — BASIC METABOLIC PANEL
Anion gap: 16 — ABNORMAL HIGH (ref 5–15)
BUN: 8 mg/dL (ref 6–20)
CO2: 21 mmol/L — ABNORMAL LOW (ref 22–32)
Calcium: 9.8 mg/dL (ref 8.9–10.3)
Chloride: 99 mmol/L (ref 98–111)
Creatinine, Ser: 0.81 mg/dL (ref 0.44–1.00)
GFR calc Af Amer: >60 mL/min (ref >60)
GFR calc non Af Amer: >60 mL/min (ref >60)
Glucose, Bld: 96 mg/dL (ref 70–99)
Potassium: 3.5 mmol/L (ref 3.5–5.1)
Sodium: 136 mmol/L (ref 135–145)

## 2017-12-20 LAB — CBC
HCT: 39.1 % (ref 36.0–46.0)
Hemoglobin: 11.9 g/dL — ABNORMAL LOW (ref 12.0–15.0)
MCH: 23.4 pg — ABNORMAL LOW (ref 26.0–34.0)
MCHC: 30.4 g/dL (ref 30.0–36.0)
MCV: 76.8 fL — ABNORMAL LOW (ref 80.0–100.0)
Platelets: 411 10*3/uL — ABNORMAL HIGH (ref 150–400)
RBC: 5.09 MIL/uL (ref 3.87–5.11)
RDW: 16.7 % — ABNORMAL HIGH (ref 11.5–15.5)
WBC: 7 10*3/uL (ref 4.0–10.5)
nRBC: 0 % (ref 0.0–0.2)

## 2017-12-20 LAB — TSH: TSH: 0.555 u[IU]/mL (ref 0.350–4.500)

## 2017-12-20 LAB — HEPATIC FUNCTION PANEL
ALT: 20 U/L (ref 0–44)
AST: 18 U/L (ref 15–41)
Albumin: 4.3 g/dL (ref 3.5–5.0)
Alkaline Phosphatase: 47 U/L (ref 38–126)
Bilirubin, Direct: 0.1 mg/dL (ref 0.0–0.2)
Indirect Bilirubin: 0.6 mg/dL (ref 0.3–0.9)
Total Bilirubin: 0.7 mg/dL (ref 0.3–1.2)
Total Protein: 8.9 g/dL — ABNORMAL HIGH (ref 6.5–8.1)

## 2017-12-20 LAB — I-STAT TROPONIN, ED: Troponin i, poc: 0 ng/mL (ref 0.00–0.08)

## 2017-12-20 LAB — T4, FREE: Free T4: 1.11 ng/dL (ref 0.82–1.77)

## 2017-12-20 LAB — I-STAT BETA HCG BLOOD, ED (MC, WL, AP ONLY): I-stat hCG, quantitative: <5 m[IU]/mL (ref <5)

## 2017-12-20 NOTE — ED Triage Notes (Addendum)
Pt reports feeling like her heart pounding for the last five days. [facility] and followed up with her primary doctor yesterday. Pt reports recent headaches as well. Now reports 3/10 central chest pains that started yesterday. Pt has a hx of HTN, compliant with medications.

## 2017-12-20 NOTE — Discharge Instructions (Signed)
Please follow up with your primary care physician. If you experience any shortness of breath, chest pain you may return to the ED for reevaluation.

## 2017-12-20 NOTE — ED Provider Notes (Signed)
[facility] EMERGENCY DEPARTMENT Provider Note   CSN: [id number] Arrival date & time: 12/20/17  1502     History   Chief Complaint Chief Complaint  Patient presents with  . Palpitations  . Hypertension    HPI Diane Lloyd is a 27 y.o. female.  27 y.o female with a PMH of HTN, PCOS, Lupus, Anemia presents to the ED with a chief complaint of chest pain x yesterday. Patient describes her pain as intermittent sharp pain in the middle of her chest.She reports this pain is worse with walking but better with lying flat. She has not tried any therapy for relieve in symptoms. Patient was seen by her PCP yesterday due to recurrent headaches and started on medication for prophylaxis therapy. She also reports intermittent tingling along the top of her head, face, arms and hands and feet.She has not tried any therapy for relieve in symptoms. Patient also has some nausea today but reports this has improved now. She denies any fever, shortness of breath, abdominal pain, or current headache.      Past Medical History:  Diagnosis Date  . Anemia    iron def  . Asthma   . Hypertension   . Lupus (HCC)    + ANA in 2012  . Obesity, unspecified   . PCOS (polycystic ovarian syndrome)     Patient Active Problem List   Diagnosis Date Noted  . Vaginal candidiasis 12/18/2015  . Diarrhea 11/10/2015  . Gastroenteritis 09/24/2015  . Screen for STD (sexually transmitted disease) 09/21/2015  . Rash and nonspecific skin eruption 08/31/2015  . Dysmenorrhea 08/31/2015  . Allergic rhinitis 05/17/2013  . Lupus (HCC)   . GERD (gastroesophageal reflux disease) 03/20/2012  . Polycystic ovarian syndrome 12/26/2011  . Family history of cardiac disorder 02/17/2011  . Contraception management 12/30/2010  . 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 Essential hypertension 01/27/2010  . ANEMIA, IRON DEFICIENCY 06/27/2009  . Obesity 03/10/2006  . ASTHMA, INTERMITTENT  03/10/2006  . ECZEMA, ATOPIC DERMATITIS 03/10/2006    Past Surgical History:  Procedure Laterality Date  . TONSILLECTOMY       OB History    Gravida  0   Para      Term      Preterm      AB      Living        SAB      TAB      Ectopic      Multiple      Live Births               Home Medications    Prior to Admission medications   Medication Sig Start Date End Date Taking? Authorizing Provider  acetaminophen (TYLENOL) 500 MG tablet Take 500 mg by mouth every 6 (six) hours as needed for headache.   Yes [provider]  amLODipine (NORVASC) 5 MG tablet Take 1 tablet (5 mg total) by mouth daily. 08/27/17  Yes Prothero, Henderson Newcomer, CNM  escitalopram (LEXAPRO) 10 MG tablet Take 10 mg by mouth daily. 12/19/17  Yes [provider]  hydrochlorothiazide (HYDRODIURIL) 12.5 MG tablet Take 1 tablet (12.5 mg total) by mouth daily. 12/17/17  Yes Lorre Nick, MD  SPRINTEC 28 0.25-35 MG-MCG tablet Take 1 tablet by mouth daily.  08/26/17  Yes [provider]    Family History Family History  Problem Relation Age of Onset  . Cancer Father 53       esophagus  .  Asthma Father   . Hypertension Mother   . Lupus Mother   . Diabetes Mother   . Cancer Maternal Uncle 40       pancreatic cancer  . Cancer Paternal Grandmother 87       esophageal    Social History Social History   Tobacco Use  . Smoking status: Never Smoker  . Smokeless tobacco: Never Used  Substance Use Topics  . Alcohol use: Yes    Comment: occasional  . Drug use: Not Currently    Types: Marijuana    Comment: LAST TIME- 2 YEARS AGO     Allergies   Diflucan [fluconazole]   Review of Systems Review of Systems  Constitutional: Negative for fever.  HENT: Negative for sore throat.   Respiratory: Negative for shortness of breath and wheezing.   Cardiovascular: Positive for chest pain and palpitations.  Gastrointestinal: Positive for nausea. Negative for abdominal  pain, diarrhea and vomiting.  Genitourinary: Negative for dysuria and flank pain.  Musculoskeletal: Negative for back pain.  Skin: Negative for pallor and wound.  Neurological: Negative for light-headedness.     Physical Exam Updated Vital Signs BP (!) 152/92   Pulse 81   Temp 99 F (37.2 C) (Oral)   Resp 14   Ht 5\' 10"  (1.778 m)   Wt 110.7 kg   LMP 12/11/2017 (Exact Date)   SpO2 99%   BMI 35.01 kg/m   Physical Exam  Constitutional: She appears well-developed and well-nourished.  HENT:  Head: Normocephalic and atraumatic.  Eyes: Pupils are equal, round, and reactive to light. Right eye exhibits normal extraocular motion and no nystagmus. Left eye exhibits normal extraocular motion and no nystagmus. Right pupil is round and reactive. Left pupil is round and reactive. Pupils are equal.  Pulmonary/Chest: She exhibits tenderness. She exhibits no edema and no swelling.    Abdominal: Bowel sounds are normal. There is no tenderness. There is no rigidity, no rebound, no guarding, no CVA tenderness, no tenderness at McBurney's point and negative Murphy's sign.  Nursing note and vitals reviewed.    ED Treatments / Results  Labs (all labs ordered are listed, but only abnormal results are displayed) Labs Reviewed  BASIC METABOLIC PANEL - Abnormal; Notable for the following components:      Result Value   CO2 21 (*)    Anion gap 16 (*)    All other components within normal limits  CBC - Abnormal; Notable for the following components:   Hemoglobin 11.9 (*)    MCV 76.8 (*)    MCH 23.4 (*)    RDW 16.7 (*)    Platelets 411 (*)    All other components within normal limits  HEPATIC FUNCTION PANEL - Abnormal; Notable for the following components:   Total Protein 8.9 (*)    All other components within normal limits  TSH  T4, FREE  I-STAT TROPONIN, ED  I-STAT BETA HCG BLOOD, ED ([facility], [facility], [facility] ONLY)    EKG EKG Interpretation  Date/Time:  Tuesday December 20 2017 15:28:07  EST Ventricular Rate:  102 PR Interval:  122 QRS Duration: 88 QT Interval:  356 QTC Calculation: 463 R Axis:   55 Text Interpretation:  Sinus tachycardia Otherwise normal ECG Since last EKG, subtle ST flattening in inferior leads Confirmed by Shaune Pollack [id number]) on 12/20/2017 3:54:46 PM   Radiology Dg Chest 2 View  Result Date: 12/20/2017 CLINICAL DATA:  Chest pain EXAM: CHEST - 2 VIEW COMPARISON:  July 12, 2017. FINDINGS: Lungs  are clear. Heart size and pulmonary vascularity are normal. No adenopathy. No pneumothorax. No bone lesions. IMPRESSION: No edema or consolidation. Electronically Signed   By: Bretta BangWilliam  Woodruff III M.D.   On: 12/20/2017 15:49    Procedures Procedures (including critical care time)  Medications Ordered in ED Medications - No data to display   Initial Impression / Assessment and Plan / ED Course  I have reviewed the triage vital signs and the nursing notes.  Pertinent labs & imaging results that were available during my care of the patient were reviewed by me and considered in my medical decision making (see chart for details).    Presents with chest pain which began yesterday along with feeling her heart pounding for the past 5 days.  Patient was seen by PCP yesterday and started on prophylactic medication for her recurrent headaches.  She is currently on 2 blood pressure medications 1 which was started by her PCP along with a second 1 started by 1 of the ED physicians.  Reports her blood pressure has been running very elevated causing her the headaches, she reports she tried explaining this to her PCP but they were not understanding at the time.  During evaluation patient denies any redness of breath, headache at the moment or other complaints.  Was tachycardic on arrival but heart rate seemed to significantly improve without treatment while she was in the room.  MP showed no electrolyte of normality, CO2 slightly decreased at 21, anion gap of 16.  CBC showed  no leukocytosis, hemoglobin stable, she does have a previous history of anemia but this numbers consistent with her previous visits.  Beta hCG was negative. First Troponin was also negative. Low suspicion for ACS as patient's pain has been going on for 5 days, it improved after sitting in bed.No treatments given. DG chest xray showed no pleural effusion, pneumothorax or consolidation.  And exhibited tachycardia on arrival but it now has improved, no hypoxia or shortness of breath at this time.  Low suspicion for pulmonary embolism.  Second troponin was also negative, a TSH and T4 were obtained to rule out any thyroid emergencies, both results were within normal limits.  I have explained these results to patient's mother and patient.  Patient's pressure along with heart rate have improved significantly while in the ED.  She is advised to follow-up with her PCP as needed.  Final Clinical Impressions(s) / ED Diagnoses   Final diagnoses:  Palpitations    ED Discharge Orders    None       Claude MangesSoto, Desyre Calma, PA-C 12/20/17 Idelle [id number]    Isaacs, Cameron, MD 12/21/17 [phone]

## 2017-12-26 ENCOUNTER — Ambulatory Visit: Payer: Medicaid Other | Admitting: Family Medicine

## 2018-01-03 ENCOUNTER — Other Ambulatory Visit: Payer: Self-pay | Admitting: Obstetrics and Gynecology

## 2018-01-03 ENCOUNTER — Other Ambulatory Visit (HOSPITAL_COMMUNITY)
Admission: RE | Admit: 2018-01-03 | Discharge: 2018-01-03 | Disposition: A | Discharge status: Home / Self Care | Payer: Medicaid Other | Source: Ambulatory Visit | Attending: Obstetrics and Gynecology | Admitting: Obstetrics and Gynecology

## 2018-01-03 DIAGNOSIS — Z01419 Encounter for gynecological examination (general) (routine) without abnormal findings: Secondary | ICD-10-CM | POA: Insufficient documentation

## 2018-01-06 LAB — CYTOLOGY - PAP: Diagnosis: NEGATIVE

## 2018-02-06 ENCOUNTER — Encounter: Payer: Self-pay | Admitting: Neurology

## 2018-02-06 ENCOUNTER — Encounter

## 2018-02-06 ENCOUNTER — Encounter: Payer: Self-pay | Admitting: Family Medicine

## 2018-02-06 ENCOUNTER — Ambulatory Visit: Payer: Self-pay | Attending: Family Medicine | Admitting: Family Medicine

## 2018-02-06 VITALS — BP 134/78 | HR 104 | Temp 99.3°F | Resp 18 | Ht 66.0 in | Wt 229.0 lb

## 2018-02-06 DIAGNOSIS — Z6836 Body mass index (BMI) 36.0-36.9, adult: Secondary | ICD-10-CM

## 2018-02-06 DIAGNOSIS — F418 Other specified anxiety disorders: Secondary | ICD-10-CM

## 2018-02-06 DIAGNOSIS — E66812 Obesity, class 2: Secondary | ICD-10-CM

## 2018-02-06 DIAGNOSIS — E669 Obesity, unspecified: Secondary | ICD-10-CM

## 2018-02-06 DIAGNOSIS — R0789 Other chest pain: Secondary | ICD-10-CM

## 2018-02-06 DIAGNOSIS — R519 Headache, unspecified: Secondary | ICD-10-CM

## 2018-02-06 DIAGNOSIS — R51 Headache: Secondary | ICD-10-CM

## 2018-02-06 DIAGNOSIS — R202 Paresthesia of skin: Secondary | ICD-10-CM

## 2018-02-06 DIAGNOSIS — R072 Precordial pain: Secondary | ICD-10-CM

## 2018-02-06 DIAGNOSIS — G8929 Other chronic pain: Secondary | ICD-10-CM

## 2018-02-06 DIAGNOSIS — R002 Palpitations: Secondary | ICD-10-CM

## 2018-02-06 DIAGNOSIS — R4589 Other symptoms and signs involving emotional state: Secondary | ICD-10-CM

## 2018-02-06 DIAGNOSIS — I10 Essential (primary) hypertension: Secondary | ICD-10-CM

## 2018-02-06 DIAGNOSIS — R5383 Other fatigue: Secondary | ICD-10-CM

## 2018-02-06 MED ORDER — LISINOPRIL-HYDROCHLOROTHIAZIDE 10-12.5 MG PO TABS: 1.0000 | ORAL_TABLET | Freq: Every day | ORAL | 1 refills | Status: DC

## 2018-02-06 MED ORDER — OMEPRAZOLE 40 MG PO CPDR: 40.0000 mg | DELAYED_RELEASE_CAPSULE | Freq: Every day | ORAL | 3 refills | Status: DC

## 2018-02-06 NOTE — Patient Instructions (Signed)
DASH Eating Plan  DASH stands for "Dietary Approaches to Stop Hypertension." The DASH eating plan is a healthy eating plan that has been shown to reduce high blood pressure (hypertension). It may also reduce your risk for type 2 diabetes, heart disease, and stroke. The DASH eating plan may also help with weight loss.  What are tips for following this plan?    General guidelines   Avoid eating more than 2,300 mg (milligrams) of salt (sodium) a day. If you have hypertension, you may need to reduce your sodium intake to 1,500 mg a day.   Limit alcohol intake to no more than 1 drink a day for nonpregnant women and 2 drinks a day for men. One drink equals 12 oz of beer, 5 oz of wine, or 1 oz of hard liquor.   Work with your health care provider to maintain a healthy body weight or to lose weight. Ask what an ideal weight is for you.   Get at least 30 minutes of exercise that causes your heart to beat faster (aerobic exercise) most days of the week. Activities may include walking, swimming, or biking.   Work with your health care provider or diet and nutrition specialist (dietitian) to adjust your eating plan to your individual calorie needs.  Reading food labels     Check food labels for the amount of sodium per serving. Choose foods with less than 5 percent of the Daily Value of sodium. Generally, foods with less than 300 mg of sodium per serving fit into this eating plan.   To find whole grains, look for the word "whole" as the first word in the ingredient list.  Shopping   Buy products labeled as "low-sodium" or "no salt added."   Buy fresh foods. Avoid canned foods and premade or frozen meals.  Cooking   Avoid adding salt when cooking. Use salt-free seasonings or herbs instead of table salt or sea salt. Check with your health care provider or pharmacist before using salt substitutes.   Do not fry foods. Cook foods using healthy methods such as baking, boiling, grilling, and broiling instead.   Cook with  heart-healthy oils, such as olive, canola, soybean, or sunflower oil.  Meal planning   Eat a balanced diet that includes:  ? 5 or more servings of fruits and vegetables each day. At each meal, try to fill half of your plate with fruits and vegetables.  ? Up to 6-8 servings of whole grains each day.  ? Less than 6 oz of lean meat, poultry, or fish each day. A 3-oz serving of meat is about the same size as a deck of cards. One egg equals 1 oz.  ? 2 servings of low-fat dairy each day.  ? A serving of nuts, seeds, or beans 5 times each week.  ? Heart-healthy fats. Healthy fats called Omega-3 fatty acids are found in foods such as flaxseeds and coldwater fish, like sardines, salmon, and mackerel.   Limit how much you eat of the following:  ? Canned or prepackaged foods.  ? Food that is high in trans fat, such as fried foods.  ? Food that is high in saturated fat, such as fatty meat.  ? Sweets, desserts, sugary drinks, and other foods with added sugar.  ? Full-fat dairy products.   Do not salt foods before eating.   Try to eat at least 2 vegetarian meals each week.   Eat more home-cooked food and less restaurant, buffet, and fast food.     When eating at a restaurant, ask that your food be prepared with less salt or no salt, if possible.  What foods are recommended?  The items listed may not be a complete list. Talk with your dietitian about what dietary choices are best for you.  Grains  Whole-grain or whole-wheat bread. Whole-grain or whole-wheat pasta. Brown rice. Oatmeal. Quinoa. Bulgur. Whole-grain and low-sodium cereals. Pita bread. Low-fat, low-sodium crackers. Whole-wheat flour tortillas.  Vegetables  Fresh or frozen vegetables (raw, steamed, roasted, or grilled). Low-sodium or reduced-sodium tomato and vegetable juice. Low-sodium or reduced-sodium tomato sauce and tomato paste. Low-sodium or reduced-sodium canned vegetables.  Fruits  All fresh, dried, or frozen fruit. Canned fruit in natural juice (without  added sugar).  Meat and other protein foods  Skinless chicken or turkey. Ground chicken or turkey. Pork with fat trimmed off. Fish and seafood. Egg whites. Dried beans, peas, or lentils. Unsalted nuts, nut butters, and seeds. Unsalted canned beans. Lean cuts of beef with fat trimmed off. Low-sodium, lean deli meat.  Dairy  Low-fat (1%) or fat-free (skim) milk. Fat-free, low-fat, or reduced-fat cheeses. Nonfat, low-sodium ricotta or cottage cheese. Low-fat or nonfat yogurt. Low-fat, low-sodium cheese.  Fats and oils  Soft margarine without trans fats. Vegetable oil. Low-fat, reduced-fat, or light mayonnaise and salad dressings (reduced-sodium). Canola, safflower, olive, soybean, and sunflower oils. Avocado.  Seasoning and other foods  Herbs. Spices. Seasoning mixes without salt. Unsalted popcorn and pretzels. Fat-free sweets.  What foods are not recommended?  The items listed may not be a complete list. Talk with your dietitian about what dietary choices are best for you.  Grains  Baked goods made with fat, such as croissants, muffins, or some breads. Dry pasta or rice meal packs.  Vegetables  Creamed or fried vegetables. Vegetables in a cheese sauce. Regular canned vegetables (not low-sodium or reduced-sodium). Regular canned tomato sauce and paste (not low-sodium or reduced-sodium). Regular tomato and vegetable juice (not low-sodium or reduced-sodium). Pickles. Olives.  Fruits  Canned fruit in a light or heavy syrup. Fried fruit. Fruit in cream or butter sauce.  Meat and other protein foods  Fatty cuts of meat. Ribs. Fried meat. Bacon. Sausage. Bologna and other processed lunch meats. Salami. Fatback. Hotdogs. Bratwurst. Salted nuts and seeds. Canned beans with added salt. Canned or smoked fish. Whole eggs or egg yolks. Chicken or turkey with skin.  Dairy  Whole or 2% milk, cream, and half-and-half. Whole or full-fat cream cheese. Whole-fat or sweetened yogurt. Full-fat cheese. Nondairy creamers. Whipped toppings.  Processed cheese and cheese spreads.  Fats and oils  Butter. Stick margarine. Lard. Shortening. Ghee. Bacon fat. Tropical oils, such as coconut, palm kernel, or palm oil.  Seasoning and other foods  Salted popcorn and pretzels. Onion salt, garlic salt, seasoned salt, table salt, and sea salt. Worcestershire sauce. Tartar sauce. Barbecue sauce. Teriyaki sauce. Soy sauce, including reduced-sodium. Steak sauce. Canned and packaged gravies. Fish sauce. Oyster sauce. Cocktail sauce. Horseradish that you find on the shelf. Ketchup. Mustard. Meat flavorings and tenderizers. Bouillon cubes. Hot sauce and Tabasco sauce. Premade or packaged marinades. Premade or packaged taco seasonings. Relishes. Regular salad dressings.  Where to find more information:   National Heart, Lung, and Blood Institute: www.nhlbi.nih.gov   American Heart Association: www.heart.org  Summary   The DASH eating plan is a healthy eating plan that has been shown to reduce high blood pressure (hypertension). It may also reduce your risk for type 2 diabetes, heart disease, and stroke.   With the   DASH eating plan, you should limit salt (sodium) intake to 2,300 mg a day. If you have hypertension, you may need to reduce your sodium intake to 1,500 mg a day.   When on the DASH eating plan, aim to eat more fresh fruits and vegetables, whole grains, lean proteins, low-fat dairy, and heart-healthy fats.   Work with your health care provider or diet and nutrition specialist (dietitian) to adjust your eating plan to your individual calorie needs.  This information is not intended to replace advice given to you by your health care provider. Make sure you discuss any questions you have with your health care provider.  Document Released: 12/17/2010 Document Revised: 12/22/2015 Document Reviewed: 12/22/2015  Elsevier Interactive Patient Education  2019 [employer]

## 2018-02-06 NOTE — Progress Notes (Signed)
hePatient has been taking mothers lisinopril-HCTZ 10mg -12.5mg  combo in place of her HCTZ stand alone.

## 2018-02-06 NOTE — Progress Notes (Signed)
Subjective:    Patient ID: Diane Lloyd, female    DOB: May 23, 1990, 28 y.o.   MRN: [id number]  HPI        28 yo female with Hypertension and complaint of sensation of her heart racing/palpitations for which she was seen in the emergency department in December.  Patient also with complaint of recurrent headaches as well as a sensation of tingling that is currently mostly in her hands but patient states that in the past she has had tingling all over her body.  Patient reports that she was supposed to see neurology but the referral closed.  Patient also has recurrent chest pain.  Patient's chest pain is sometimes in the left upper chest and times substernal as well as in the mid upper abdomen.  Patient reports that her chest pain is constant and is about a 3-5 on a 0-to-10 scale with 0 being no pain and 10 being the worst pain imaginable.  Patient also sometimes has a burning sensation in her substernal/mid chest area.  Patient also reports headaches that are on both sides of her head, headaches are dull, tingling and are about a 5-8 on a 0-to-10 scale.  Tylenol Extra Strength does help with her headaches.  Patient also has dizziness, nausea and noise sensitivity with her headaches.      Patient also has recurrent sensation of increased heart rate.  She does believe that this may be related to stress that she does tend to worry a lot.  Patient states that she generally worries about her health especially about her hypertension, chest pain and headaches.  Patient states that she has sensation of tingling all over and this is the reason that she was referred to neurology due to her headaches and tingling sensation.  Patient reports past medical history of hypertension, PCOS, prior work-up for lupus which was negative, anemia, asthma, eczema, depression, and GERD.  Patient reports that her asthma occurred in childhood and she has not had any recent symptoms.  Patient denies depression but admits to anxiety.   Patient reports that she is allergic to Diflucan which causes her to have a rash.  Patient reports that her only current medications are Sprintec and patient states that she started lisinopril hydrochlorothiazide at the end of December.  Patient was also taking atenolol at one point but this was not prescribed for her.       Patient reports that her only prior surgery has been tonsillectomy.  Patient reports that she does not smoke but occasionally drinks alcohol.  Patient is currently single.  Patient works for [employer] and states that her job can be stressful.  Patient reports family history significant for mother with hypertension and lupus.  Maternal grandmother with pancreatic cancer and paternal grandmother with ovarian cancer.  Paternal grandfather and paternal grandmother with hypertension and patient reports a family history of father possibly having had esophageal cancer.  Past Medical History:  Diagnosis Date  . Anemia    iron def  . Asthma   . Hypertension   . Lupus (HCC)    + ANA in 2012  . Obesity, unspecified   . PCOS (polycystic ovarian syndrome)   Patient saw Rheumatologist, 05/05/2017: on evidence of connective tissue disorder but lab work also done  and positive ANA 1:160 (normal less than 1:80), Speckled pattern Past Surgical History:  Procedure Laterality Date  . TONSILLECTOMY     Family History  Problem Relation Age of Onset  . Cancer Father 64  esophagus  . Asthma Father   . Hypertension Mother   . Lupus Mother   . Diabetes Mother   . Cancer Maternal Uncle 40       pancreatic cancer  . Cancer Paternal Grandmother 85       esophageal   Social History   Tobacco Use  . Smoking status: Never Smoker  . Smokeless tobacco: Never Used  Substance Use Topics  . Alcohol use: Yes    Comment: occasional  . Drug use: Not Currently    Types: Marijuana    Comment: LAST TIME- 2 YEARS AGO   Allergies  Allergen Reactions  . Diflucan [Fluconazole] Itching and Rash       Review of Systems  Constitutional: Positive for fatigue. Negative for chills and fever.  HENT: Positive for congestion (occasional). Negative for facial swelling, hearing loss, nosebleeds, sore throat and trouble swallowing.   Eyes: Negative for photophobia and visual disturbance.  Respiratory: Negative for cough and shortness of breath.   Cardiovascular: Positive for chest pain and palpitations (sensation of increased heart rate). Negative for leg swelling.  Gastrointestinal: Positive for abdominal pain (occasional epigastric pain). Negative for blood in stool, constipation, diarrhea, nausea and vomiting.  Endocrine: Negative for cold intolerance, heat intolerance, polydipsia, polyphagia and polyuria.  Genitourinary: Negative for dysuria, flank pain and frequency.  Musculoskeletal: Positive for arthralgias and myalgias. Negative for back pain, gait problem, joint swelling, neck pain and neck stiffness.  Skin: Positive for rash (has eczema). Negative for wound.  Allergic/Immunologic: Positive for immunocompromised state (patient thinks that she has Lupus but not on medication for treatment). Negative for environmental allergies and food allergies.  Neurological: Positive for dizziness, light-headedness, numbness (generalized numbness and tingling) and headaches. Negative for tremors, seizures, syncope, facial asymmetry, speech difficulty and weakness.  Hematological: Negative for adenopathy. Does not bruise/bleed easily.  Psychiatric/Behavioral: Positive for sleep disturbance (occasional). Negative for self-injury and suicidal ideas. The patient is nervous/anxious.        Objective:   Physical Exam BP 134/78 (BP Location: Left Arm, Patient Position: Sitting, Cuff Size: Normal)   Pulse (!) 104   Temp 99.3 F (37.4 C) (Oral)   Resp 18   Ht 5\' 6"  (1.676 m)   Wt 229 lb (103.9 kg)   LMP 01/30/2018   SpO2 100%   BMI 36.96 kg/m Nurse's notes and vital signs  reviewed General-well-nourished, well-developed/obese young adult female in no acute distress.  Patient is accompanied by her mother at today's visit ENT-TMs dull, nares with mild edema of the nasal turbinates with clear nasal discharge, patient with mild posterior pharynx erythema Neck-supple, no lymphadenopathy, no thyromegaly.  Patient with skin changes consistent with acanthosis nigricans on the neck Lungs-clear to auscultation bilaterally Cardiovascular-regular rate and rhythm Abdomen- truncal obesity, mild distention, soft, no reproducible abdominal tenderness Back-no CVA tenderness Extremities-no edema Psych-patient appears to be anxious but calms down as the visit progresses Neuro-cranial nerves II through XII are grossly intact        Assessment & Plan:  1. Essential hypertension Patient reports that she is currently on lisinopril hydrochlorothiazide for control of her blood pressure.  Blood pressure today's visit is 134/78 which is near goal of 130/80.  DASH diet encouraged and patient provided with refill of her lisinopril hydrochlorothiazide and CMP will be done in follow-up of medication use - Comprehensive metabolic panel - lisinopril-hydrochlorothiazide (PRINZIDE,ZESTORETIC) 10-12.5 MG tablet; Take 1 tablet by mouth daily. To lower blood pressure  Dispense: 90 tablet; Refill: 1  2.  Palpitations Patient with complaint of palpitations, sensation of increased heart rate.  Patient will have CMP done in follow-up of complaint of palpitations to look for electrolyte abnormality as well as CBC to look for anemia and TSH/T4 to look for thyroid disorder which may be contributing to her sensation of palpitations.  Patient will be referred to cardiology for further evaluation and treatment - Comprehensive metabolic panel - Ambulatory referral to Cardiology  3. Atypical chest pain Patient with atypical chest pain.  Patient will have H. pylori breath test to look for possible bacteria  that can cause stomach ulcers and patient will have CMP to look for electrolyte or liver abnormality.  Patient will also be referred to cardiology - H. pylori breath test - Comprehensive metabolic panel - Ambulatory referral to Cardiology  4. Substernal chest pain Patient with complaint of substernal chest pain and patient will have H. pylori breath test.  Patient has also been referred to cardiology - H. pylori breath test  5. Chronic nonintractable headache, unspecified headache type Patient with chronic headaches and sensation of generalized numbness and tingling.  I suspect that some of her headaches and tingling sensations may be related to stress/anxiety but will also refer patient to neurology for further evaluation and treatment - Ambulatory referral to Neurology  6. Anxiety about health Patient seems to have a great deal of anxiety regarding her health and patient was encouraged to seek counseling which she is reluctant to do at this time.  Also discussed the possibility of medications but patient does not wish to take a daily medicine such as sertraline or BuSpar to help with her anxiety.  7. Paresthesias Patient with complaint of recurrent paresthesias/numbness and tingling sensation.  Patient will have vitamin B12 level done at today's visit as well as CBC to look for anemia or vitamin B12 deficiency. TSH and T4 to look for thyroid disorder.  Follow-up patient will be referred to neurology for further evaluation and treatment - Ambulatory referral to Neurology - Vitamin B12 - TSH + free T4  8. Class 2 obesity with body mass index (BMI) of 36.0 to 36.9 in adult, unspecified obesity type, unspecified whether serious comorbidity present Patient will have hemoglobin A1c in follow-up of family history of diabetes as well as current obesity.  Weight loss through dietary changes and low impact cardiovascular exercise encouraged - Hemoglobin A1c  9. Fatigue, unspecified type Patient  with complaint of ongoing issues with fatigue and patient will have CMP to look for electrolyte disorder, elevated blood sugar or liver disorder, CBC to look for anemia or other blood disorder, vitamin B12 to look for B12 deficiency as well as TSH and free T4.  Patient also encouraged to take a daily over-the-counter vitamin D supplement of 1000 to 2000 IUs and if continued fatigue, patient will need vitamin D3 level done at a future visit. - Comprehensive metabolic panel - CBC with Differential - Vitamin B12 - TSH + free T4  An After Visit Summary was printed and given to the patient.  Allergies as of 02/06/2018      Reactions   Diflucan [fluconazole] Itching, Rash      Medication List       Accurate as of February 06, 2018 11:59 PM. Always use your most recent med list.        acetaminophen 500 MG tablet Commonly known as:  TYLENOL Take 500 mg by mouth every 6 (six) hours as needed for headache.   escitalopram 10 MG tablet Commonly  known as:  LEXAPRO Take 10 mg by mouth daily.   hydrochlorothiazide 12.5 MG tablet Commonly known as:  HYDRODIURIL Take 1 tablet (12.5 mg total) by mouth daily.   lisinopril-hydrochlorothiazide 10-12.5 MG tablet Commonly known as:  PRINZIDE,ZESTORETIC Take 1 tablet by mouth daily. To lower blood pressure   omeprazole 40 MG capsule Commonly known as:  PRILOSEC Take 1 capsule (40 mg total) by mouth daily. To lower stomach acid   Sprintec 28 0.25-35 MG-MCG tablet Generic drug:  norgestimate-ethinyl estradiol Take 1 tablet by mouth daily.      Return in about 3 weeks (around 02/27/2018) for HTN/chest pain.

## 2018-02-07 LAB — COMPREHENSIVE METABOLIC PANEL WITH GFR
ALT: 20 IU/L (ref 0–32)
AST: 14 IU/L (ref 0–40)
Albumin/Globulin Ratio: 1.4 (ref 1.2–2.2)
Albumin: 4.5 g/dL (ref 3.9–5.0)
Alkaline Phosphatase: 51 IU/L (ref 39–117)
BUN/Creatinine Ratio: 11 (ref 9–23)
BUN: 9 mg/dL (ref 6–20)
Bilirubin Total: 0.2 mg/dL (ref 0.0–1.2)
CO2: 17 mmol/L — ABNORMAL LOW (ref 20–29)
Calcium: 9.8 mg/dL (ref 8.7–10.2)
Chloride: 101 mmol/L (ref 96–106)
Creatinine, Ser: 0.81 mg/dL (ref 0.57–1.00)
GFR calc Af Amer: 117 mL/min/1.73
GFR calc non Af Amer: 101 mL/min/1.73
Globulin, Total: 3.3 g/dL (ref 1.5–4.5)
Glucose: 85 mg/dL (ref 65–99)
Potassium: 4.1 mmol/L (ref 3.5–5.2)
Sodium: 140 mmol/L (ref 134–144)
Total Protein: 7.8 g/dL (ref 6.0–8.5)

## 2018-02-07 LAB — HEMOGLOBIN A1C
Est. average glucose Bld gHb Est-mCnc: 97 mg/dL
Hgb A1c MFr Bld: 5 % (ref 4.8–5.6)

## 2018-02-07 LAB — CBC WITH DIFFERENTIAL/PLATELET
Basophils Absolute: 0 x10E3/uL (ref 0.0–0.2)
Basos: 0 %
EOS (ABSOLUTE): 0 x10E3/uL (ref 0.0–0.4)
Eos: 0 %
Hematocrit: 35.8 % (ref 34.0–46.6)
Hemoglobin: 11 g/dL — ABNORMAL LOW (ref 11.1–15.9)
Immature Grans (Abs): 0 x10E3/uL (ref 0.0–0.1)
Immature Granulocytes: 0 %
Lymphocytes Absolute: 1.5 x10E3/uL (ref 0.7–3.1)
Lymphs: 29 %
MCH: 24.2 pg — ABNORMAL LOW (ref 26.6–33.0)
MCHC: 30.7 g/dL — ABNORMAL LOW (ref 31.5–35.7)
MCV: 79 fL (ref 79–97)
Monocytes Absolute: 0.4 x10E3/uL (ref 0.1–0.9)
Monocytes: 7 %
Neutrophils Absolute: 3.4 x10E3/uL (ref 1.4–7.0)
Neutrophils: 64 %
Platelets: 370 x10E3/uL (ref 150–450)
RBC: 4.55 x10E6/uL (ref 3.77–5.28)
RDW: 16.1 % — ABNORMAL HIGH (ref 11.7–15.4)
WBC: 5.3 x10E3/uL (ref 3.4–10.8)

## 2018-02-07 LAB — TSH+FREE T4
Free T4: 1.65 ng/dL (ref 0.82–1.77)
TSH: 0.927 u[IU]/mL (ref 0.450–4.500)

## 2018-02-07 LAB — VITAMIN B12: Vitamin B-12: 462 pg/mL (ref 232–1245)

## 2018-02-08 LAB — H. PYLORI BREATH TEST: H pylori Breath Test: NEGATIVE

## 2018-02-17 NOTE — Progress Notes (Signed)
NEUROLOGY CONSULTATION NOTE  Diane Lloyd MRN: [id number] DOB: 1990/08/15  Referring provider: Cain Saupeammie Fulp, MD Primary care provider: Cain Saupeammie Fulp, MD  Reason for consult:  headache  HISTORY OF PRESENT ILLNESS: Diane Lloyd is a 28 year old right-handed African American woman with lupus, hypertension, and PCOS who presents for headache.  History supplemented by ED notes.  Onset:  Since early December 2019 Location:  Back of head, usually right sided.  Sometimes associated neck pain Quality:  pressure Intensity:  4/10.  She denies new headache, thunderclap headache or severe headache that wakes her from sleep. Aura:  none Prodrome:  none Postdrome:  none Associated symptoms:  Sometimes dizziness.  She denies associated nausea, vomiting, photophobia, phonophobia, visual disturbance or unilateral numbness or weakness. Duration:  10-15 minutes about 2 times in a 24 hour period. Frequency:  2 days a week Frequency of abortive medication: Tylenol Extra Strength 2 days a week Triggers:  None Relieving factors:  Tylenol Activity:  Not aggravated  Last year, she has endorsed episodes of paresthesias involving her entire body, including the scalp.  It occurs twice a month, lasting 2 minutes.  She had an MRI of the brain with and without contrast on 04/27/17 to further evaluate this, which was personally reviewed and was normal.  In December, she was seen in the ED for headaches as well as for abdominal pain, chest pain, paresthesias, palpitations and elevated blood pressure.  CT of head from 12/15/17 was personally reviewed and was normal.    02/06/18 LABS:  B12 462, TSH 0.927, Hgb A1c 5.  Current NSAIDS:  none Current analgesics:  Tylenol Extra Strength 500mg  Current triptans:  none Current ergotamine:  none Current anti-emetic:  none Current muscle relaxants:  none Current anti-anxiolytic:  none Current sleep aide:  none Current Antihypertensive medications:   Lisinopril-HCTZ Current Antidepressant medications:  none Current Anticonvulsant medications:  none Current anti-CGRP:  none Current Vitamins/Herbal/Supplements:  none Current Antihistamines/Decongestants:  none Other therapy:  none Hormone/birth control:  Sprintec  Past medications/therapies:  None  Caffeine:  Soda frequent prior to headache onset, now infrequent Diet:  5-6 16.9 oz bottles of water daily. Exercise:  No Depression:  no; Anxiety:  yes Other pain:  no Sleep hygiene:  Not too good. She has history of bifrontal headaches and usually do not require medication. Family history of headache:  No.   PAST MEDICAL HISTORY: Past Medical History:  Diagnosis Date  . Anemia    iron def  . Asthma   . Hypertension   . Lupus (HCC)    + ANA in 2012  . Obesity, unspecified   . PCOS (polycystic ovarian syndrome)     PAST SURGICAL HISTORY: Past Surgical History:  Procedure Laterality Date  . TONSILLECTOMY      MEDICATIONS: Current Outpatient Medications on File Prior to Visit  Medication Sig Dispense Refill  . acetaminophen (TYLENOL) 500 MG tablet Take 500 mg by mouth every 6 (six) hours as needed for headache.    . escitalopram (LEXAPRO) 10 MG tablet Take 10 mg by mouth daily.  1  . hydrochlorothiazide (HYDRODIURIL) 12.5 MG tablet Take 1 tablet (12.5 mg total) by mouth daily. (Patient not taking: Reported on 02/06/2018) 30 tablet 0  . lisinopril-hydrochlorothiazide (PRINZIDE,ZESTORETIC) 10-12.5 MG tablet Take 1 tablet by mouth daily. To lower blood pressure 90 tablet 1  . omeprazole (PRILOSEC) 40 MG capsule Take 1 capsule (40 mg total) by mouth daily. To lower stomach acid 30 capsule 3  .  SPRINTEC 28 0.25-35 MG-MCG tablet Take 1 tablet by mouth daily.   1   No current facility-administered medications on file prior to visit.     ALLERGIES: Allergies  Allergen Reactions  . Diflucan [Fluconazole] Itching and Rash    FAMILY HISTORY: Family History  Problem Relation  Age of Onset  . Cancer Father 27       esophagus  . Asthma Father   . Hypertension Mother   . Lupus Mother   . Diabetes Mother   . Cancer Maternal Uncle 40       pancreatic cancer  . Cancer Paternal Grandmother 57       esophageal   SOCIAL HISTORY: Social History   Socioeconomic History  . Marital status: Single    Spouse name: Not on file  . Number of children: Not on file  . Years of education: Not on file  . Highest education level: Not on file  Occupational History  . Not on file  Social Needs  . Financial resource strain: Not on file  . Food insecurity:    Worry: Not on file    Inability: Not on file  . Transportation needs:    Medical: Not on file    Non-medical: Not on file  Tobacco Use  . Smoking status: Never Smoker  . Smokeless tobacco: Never Used  Substance and Sexual Activity  . Alcohol use: Yes    Comment: occasional  . Drug use: Not Currently    Types: Marijuana    Comment: LAST TIME- 2 YEARS AGO  . Sexual activity: Yes    Birth control/protection: Pill  Lifestyle  . Physical activity:    Days per week: Not on file    Minutes per session: Not on file  . Stress: Not on file  Relationships  . Social connections:    Talks on phone: Not on file    Gets together: Not on file    Attends religious service: Not on file    Active member of club or organization: Not on file    Attends meetings of clubs or organizations: Not on file    Relationship status: Not on file  . Intimate partner violence:    Fear of current or ex partner: Not on file    Emotionally abused: Not on file    Physically abused: Not on file    Forced sexual activity: Not on file  Other Topics Concern  . Not on file  Social History Narrative  . Not on file    REVIEW OF SYSTEMS: Constitutional: No fevers, chills, or sweats, no generalized fatigue, change in appetite Eyes: No visual changes, double vision, eye pain Ear, nose and throat: No hearing loss, ear pain, nasal  congestion, sore throat Cardiovascular: No chest pain, palpitations Respiratory:  No shortness of breath at rest or with exertion, wheezes GastrointestinaI: No nausea, vomiting, diarrhea, abdominal pain, fecal incontinence Genitourinary:  No dysuria, urinary retention or frequency Musculoskeletal:  No neck pain, back pain Integumentary: No rash, pruritus, skin lesions Neurological: as above Psychiatric: anxiety Endocrine: No palpitations, fatigue, diaphoresis, mood swings, change in appetite, change in weight, increased thirst Hematologic/Lymphatic:  No purpura, petechiae. Allergic/Immunologic: no itchy/runny eyes, nasal congestion, recent allergic reactions, rashes  PHYSICAL EXAM: Blood pressure 126/62, pulse 89, height 5\' 10"  (1.778 m), weight 228 lb (103.4 kg), last menstrual period 01/30/2018, SpO2 99 %. General: No acute distress.  Patient appears well-groomed.   Head:  Normocephalic/atraumatic Eyes:  fundi examined but not visualized Neck: supple,  no paraspinal tenderness, full range of motion Back: No paraspinal tenderness Heart: regular rate and rhythm Lungs: Clear to auscultation bilaterally. Vascular: No carotid bruits. Neurological Exam: Mental status: alert and oriented to person, place, and time, recent and remote memory intact, fund of knowledge intact, attention and concentration intact, speech fluent and not dysarthric, language intact. Cranial nerves: CN I: not tested CN II: pupils equal, round and reactive to light, visual fields intact CN III, IV, VI:  full range of motion, no nystagmus, no ptosis CN V: facial sensation intact CN VII: upper and lower face symmetric CN VIII: hearing intact CN IX, X: gag intact, uvula midline CN XI: sternocleidomastoid and trapezius muscles intact CN XII: tongue midline Bulk & Tone: normal, no fasciculations. Motor:  5/5 throughout  Sensation: temperature and vibration sensation intact. Deep Tendon Reflexes:  2+ throughout, toes  downgoing.  Finger to nose testing:  Without dysmetria.  Heel to shin:  Without dysmetria.  Gait:  Normal station and stride.  Romberg negative.  IMPRESSION: 1.  Episodic tension-type headaches, not intractable.  Stable and manageable, not requiring pharmacologic intervention. 2.  Episodic paresthesias.  Unclear etiology.  Possibly related to anxiety.  As they are infrequent and episodic, not likely to be a peripheral neurologic etiology.  MRI brain and labs negative for secondary causes.  PLAN: 1.  She will continue Tylenol Extra-Strength as it is effective. 2.  Limit use of pain relievers to no more than 2 days out of week to prevent risk of rebound or medication-overuse headache. 3.  If headaches become more frequent, she should keep headache diary and make follow up appointment 4.  Follow up as needed.  Thank you for allowing me to take part in the care of this patient.  Shon Millet, DO  CC:  Cain Saupe, MD

## 2018-02-20 ENCOUNTER — Ambulatory Visit: Payer: Self-pay | Admitting: Neurology

## 2018-02-20 ENCOUNTER — Encounter: Payer: Self-pay | Admitting: Neurology

## 2018-02-20 VITALS — BP 126/62 | HR 89 | Ht 70.0 in | Wt 228.0 lb

## 2018-02-20 DIAGNOSIS — R202 Paresthesia of skin: Secondary | ICD-10-CM

## 2018-02-20 DIAGNOSIS — G44219 Episodic tension-type headache, not intractable: Secondary | ICD-10-CM

## 2018-02-20 NOTE — Patient Instructions (Signed)
1.  Limit use of pain relievers to no more than 2 days out of week to prevent risk of rebound or medication-overuse headache. 2.  If headaches become frequent, keep a headache diary and make a follow up appointment. 3.  Follow up as needed.

## 2018-02-20 NOTE — Addendum Note (Signed)
Addended byEverlena Cooper, ADAM R on: 02/20/2018 02:28 PM   Modules accepted: Level of Service

## 2018-02-27 ENCOUNTER — Telehealth: Payer: Self-pay | Admitting: Family Medicine

## 2018-02-27 NOTE — Telephone Encounter (Signed)
Pt called in stated its been more then 2 weeks and has not been contacted by cardiology would like to see if pcp could help with her being scheduled in with the cardiology she was referred too Please follow up

## 2018-02-27 NOTE — Telephone Encounter (Signed)
Patient verified DOB Patient was referred to [facility] and was provided the contact information for their office as well as the location. [address], [place], [place] [address]. Patient advised to contact the office for clarity on the appointment. No further questions.

## 2018-02-28 ENCOUNTER — Encounter (HOSPITAL_COMMUNITY): Payer: Self-pay

## 2018-02-28 ENCOUNTER — Emergency Department (HOSPITAL_COMMUNITY)
Admission: EM | Admit: 2018-02-28 | Discharge: 2018-03-01 | Disposition: A | Discharge status: Home / Self Care | Payer: Medicaid Other | Attending: Emergency Medicine | Admitting: Emergency Medicine

## 2018-02-28 DIAGNOSIS — R079 Chest pain, unspecified: Secondary | ICD-10-CM | POA: Insufficient documentation

## 2018-02-28 DIAGNOSIS — Z5321 Procedure and treatment not carried out due to patient leaving prior to being seen by health care provider: Secondary | ICD-10-CM | POA: Insufficient documentation

## 2018-02-28 LAB — CBC
HCT: 34.8 % — ABNORMAL LOW (ref 36.0–46.0)
Hemoglobin: 10.4 g/dL — ABNORMAL LOW (ref 12.0–15.0)
MCH: 24.2 pg — ABNORMAL LOW (ref 26.0–34.0)
MCHC: 29.9 g/dL — ABNORMAL LOW (ref 30.0–36.0)
MCV: 80.9 fL (ref 80.0–100.0)
Platelets: 350 10*3/uL (ref 150–400)
RBC: 4.3 MIL/uL (ref 3.87–5.11)
RDW: 15.9 % — ABNORMAL HIGH (ref 11.5–15.5)
WBC: 6.7 10*3/uL (ref 4.0–10.5)
nRBC: 0 % (ref 0.0–0.2)

## 2018-02-28 LAB — I-STAT TROPONIN, ED: Troponin i, poc: 0 ng/mL (ref 0.00–0.08)

## 2018-02-28 LAB — BASIC METABOLIC PANEL
Anion gap: 11 (ref 5–15)
BUN: 13 mg/dL (ref 6–20)
CO2: 22 mmol/L (ref 22–32)
Calcium: 9.1 mg/dL (ref 8.9–10.3)
Chloride: 102 mmol/L (ref 98–111)
Creatinine, Ser: 0.89 mg/dL (ref 0.44–1.00)
GFR calc Af Amer: >60 mL/min (ref >60)
GFR calc non Af Amer: >60 mL/min (ref >60)
Glucose, Bld: 84 mg/dL (ref 70–99)
Potassium: 3.4 mmol/L — ABNORMAL LOW (ref 3.5–5.1)
Sodium: 135 mmol/L (ref 135–145)

## 2018-02-28 LAB — I-STAT BETA HCG BLOOD, ED (MC, WL, AP ONLY): I-stat hCG, quantitative: <5 m[IU]/mL (ref <5)

## 2018-02-28 NOTE — ED Triage Notes (Signed)
Pt reports having upper left sided chest pain intermittently since December, pt states she has an appointment with the cardiologist on 03/20/18 but it got worse today. Pt states she has had dizziness and nausea with the chest pain.

## 2018-03-01 ENCOUNTER — Other Ambulatory Visit: Payer: Self-pay

## 2018-03-01 ENCOUNTER — Emergency Department (HOSPITAL_BASED_OUTPATIENT_CLINIC_OR_DEPARTMENT_OTHER)
Admission: EM | Admit: 2018-03-01 | Discharge: 2018-03-01 | Disposition: A | Discharge status: Home / Self Care | Payer: Medicaid Other | Attending: Emergency Medicine | Admitting: Emergency Medicine

## 2018-03-01 ENCOUNTER — Emergency Department (HOSPITAL_BASED_OUTPATIENT_CLINIC_OR_DEPARTMENT_OTHER): Payer: Medicaid Other

## 2018-03-01 ENCOUNTER — Encounter (HOSPITAL_BASED_OUTPATIENT_CLINIC_OR_DEPARTMENT_OTHER): Payer: Self-pay | Admitting: *Deleted

## 2018-03-01 DIAGNOSIS — R079 Chest pain, unspecified: Secondary | ICD-10-CM | POA: Insufficient documentation

## 2018-03-01 DIAGNOSIS — Z79899 Other long term (current) drug therapy: Secondary | ICD-10-CM | POA: Insufficient documentation

## 2018-03-01 DIAGNOSIS — J45909 Unspecified asthma, uncomplicated: Secondary | ICD-10-CM | POA: Insufficient documentation

## 2018-03-01 DIAGNOSIS — I10 Essential (primary) hypertension: Secondary | ICD-10-CM | POA: Insufficient documentation

## 2018-03-01 DIAGNOSIS — F329 Major depressive disorder, single episode, unspecified: Secondary | ICD-10-CM | POA: Insufficient documentation

## 2018-03-01 MED ORDER — HYDROXYZINE HCL 10 MG PO TABS
10.0000 mg | ORAL_TABLET | Freq: Once | ORAL | Status: DC
  Filled 2018-03-01: qty 1

## 2018-03-01 MED ORDER — HYDROXYZINE HCL 10 MG PO TABS: 10.0000 mg | ORAL_TABLET | Freq: Four times a day (QID) | ORAL | 0 refills | Status: DC | PRN

## 2018-03-01 NOTE — ED Provider Notes (Signed)
[facility] EMERGENCY DEPARTMENT Provider Note   CSN: [id number] Arrival date & time: 03/01/18  1348    History   Chief Complaint Chief Complaint  Patient presents with  . Chest Pain    HPI Diane Lloyd is a 28 y.o. female with a hx of anemia, asthma, HTN, obesity, and PCOS who presents to the ED with complaints of intermittent chest pain since early December 2019.  Patient states the chest pain varies in location and can be to the left or right anterior chest wall or to the epigastrium area.  She states current pain is epigastric/substernal, 4 out of 10 in severity, and was improved with Tylenol taken prior to arrival.  States that she has had increased stress over the past couple of months and that anxiety may make her symptoms worse.  No other specific alleviating or aggravating factors.  No change with a deep breath or with exertion.  She states she gets associated palpitations, nausea, and lightheadedness at times, not necessarily with every episode of pain.  She states that this pain and the associated symptoms are similar to what she has been experiencing on and off for the past 2.5 months.  She states she has been evaluated for this in the past and does have a follow-up appointment scheduled with cardiology 03/20/18.  Pain has been occurring intermittently, she states that happens anywhere from 10-15 times per month and lasts for 5 to 10 minutes to up to an hour.  Went to [facility] emergency department for evaluation last evening but left due to long wait time.  Denies fever, chills, cough, syncope, vomiting, diarrhea, leg pain/swelling, hemoptysis, recent surgery/trauma, recent long travel, personal hx of cancer, or hx of DVT/PE.  Tobacco use, illicit substance use or alcohol abuse, states she does consume alcohol occasionally.     HPI  Past Medical History:  Diagnosis Date  . Anemia    iron def  . Asthma   . Hypertension   . Lupus (HCC)    + ANA in 2012  .  Obesity, unspecified   . PCOS (polycystic ovarian syndrome)     Patient Active Problem List   Diagnosis Date Noted  . Vaginal candidiasis 12/18/2015  . Diarrhea 11/10/2015  . Gastroenteritis 09/24/2015  . Screen for STD (sexually transmitted disease) 09/21/2015  . Rash and nonspecific skin eruption 08/31/2015  . Dysmenorrhea 08/31/2015  . Allergic rhinitis 05/17/2013  . Lupus (HCC)   . GERD (gastroesophageal reflux disease) 03/20/2012  . Polycystic ovarian syndrome 12/26/2011  . Family history of cardiac disorder 02/17/2011  . Contraception management 12/30/2010  . 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 Essential hypertension 01/27/2010  . ANEMIA, IRON DEFICIENCY 06/27/2009  . Obesity 03/10/2006  . ASTHMA, INTERMITTENT 03/10/2006  . ECZEMA, ATOPIC DERMATITIS 03/10/2006    Past Surgical History:  Procedure Laterality Date  . TONSILLECTOMY       OB History    Gravida  0   Para      Term      Preterm      AB      Living        SAB      TAB      Ectopic      Multiple      Live Births               Home Medications    Prior to Admission medications   Medication Sig Start Date End Date Taking?  Authorizing Provider  acetaminophen (TYLENOL) 500 MG tablet Take 500 mg by mouth every 6 (six) hours as needed for headache.    [provider]  escitalopram (LEXAPRO) 10 MG tablet Take 10 mg by mouth daily. 12/19/17   [provider]  lisinopril-hydrochlorothiazide (PRINZIDE,ZESTORETIC) 10-12.5 MG tablet Take 1 tablet by mouth daily. To lower blood pressure 02/06/18   Fulp, Cammie, MD  SPRINTEC 28 0.25-35 MG-MCG tablet Take 1 tablet by mouth daily.  08/26/17   [provider]    Family History Family History  Problem Relation Age of Onset  . Cancer Father 51       esophagus  . Asthma Father   . Hypertension Mother   . Lupus Mother   . Diabetes Mother   . Cancer Maternal Uncle 40       pancreatic cancer  .  Cancer Paternal Grandmother 69       esophageal    Social History Social History   Tobacco Use  . Smoking status: Never Smoker  . Smokeless tobacco: Never Used  Substance Use Topics  . Alcohol use: Yes    Comment: occasional  . Drug use: Not Currently    Types: Marijuana    Comment: LAST TIME- 2 YEARS AGO     Allergies   Diflucan [fluconazole]   Review of Systems Review of Systems  Constitutional: Negative for chills and fever.  Respiratory: Negative for cough and shortness of breath.   Cardiovascular: Positive for chest pain and palpitations. Negative for leg swelling.  Gastrointestinal: Positive for nausea. Negative for blood in stool, constipation, diarrhea and vomiting.  Neurological: Positive for light-headedness. Negative for seizures, syncope, weakness, numbness and headaches.  All other systems reviewed and are negative.    Physical Exam Updated Vital Signs BP (!) 161/77   Pulse 96   Temp 98.4 F (36.9 C) (Oral)   Resp 18   Ht 5\' 10"  (1.778 m)   Wt 103 kg   LMP 01/30/2018   SpO2 100%   BMI 32.58 kg/m   Physical Exam Vitals signs and nursing note reviewed.  Constitutional:      General: She is not in acute distress.    Appearance: She is well-developed. She is not toxic-appearing.  HENT:     Head: Normocephalic and atraumatic.  Eyes:     General:        Right eye: No discharge.        Left eye: No discharge.     Conjunctiva/sclera: Conjunctivae normal.  Neck:     Musculoskeletal: Neck supple.  Cardiovascular:     Rate and Rhythm: Normal rate and regular rhythm.     Pulses:          Radial pulses are 2+ on the right side and 1+ on the left side.  Pulmonary:     Effort: Pulmonary effort is normal. No respiratory distress.     Breath sounds: Normal breath sounds. No wheezing, rhonchi or rales.  Chest:     Chest wall: Tenderness (Mild to anterior chest wall without palpable crepitus or overlying skin changes.) present.  Abdominal:      General: There is no distension.     Palpations: Abdomen is soft.     Tenderness: There is no abdominal tenderness. There is no guarding or rebound.     Comments: Negative Murphy's.  Musculoskeletal:     Right lower leg: No edema.     Left lower leg: No edema.  Skin:    General:  Skin is warm and dry.     Findings: No rash.  Neurological:     General: No focal deficit present.     Mental Status: She is alert.     Comments: Clear speech.   Psychiatric:        Behavior: Behavior normal.    ED Treatments / Results  Labs Results for orders placed or performed in visit on 02/06/18  H. pylori breath test  Result Value Ref Range   H pylori Breath Test Negative Negative  Comprehensive metabolic panel  Result Value Ref Range   Glucose 85 65 - 99 mg/dL   BUN 9 6 - 20 mg/dL   Creatinine, Ser 8.290.81 0.57 - 1.00 mg/dL   GFR calc non Af Amer 101 >59 mL/min/1.73   GFR calc Af Amer 117 >59 mL/min/1.73   BUN/Creatinine Ratio 11 9 - 23   Sodium 140 134 - 144 mmol/L   Potassium 4.1 3.5 - 5.2 mmol/L   Chloride 101 96 - 106 mmol/L   CO2 17 (L) 20 - 29 mmol/L   Calcium 9.8 8.7 - 10.2 mg/dL   Total Protein 7.8 6.0 - 8.5 g/dL   Albumin 4.5 3.9 - 5.0 g/dL   Globulin, Total 3.3 1.5 - 4.5 g/dL   Albumin/Globulin Ratio 1.4 1.2 - 2.2   Bilirubin Total 0.2 0.0 - 1.2 mg/dL   Alkaline Phosphatase 51 39 - 117 IU/L   AST 14 0 - 40 IU/L   ALT 20 0 - 32 IU/L  CBC with Differential  Result Value Ref Range   WBC 5.3 3.4 - 10.8 x10E3/uL   RBC 4.55 3.77 - 5.28 x10E6/uL   Hemoglobin 11.0 (L) 11.1 - 15.9 g/dL   Hematocrit 56.235.8 13.034.0 - 46.6 %   MCV 79 79 - 97 fL   MCH 24.2 (L) 26.6 - 33.0 pg   MCHC 30.7 (L) 31.5 - 35.7 g/dL   RDW 86.516.1 (H) 78.411.7 - 69.615.4 %   Platelets 370 150 - 450 x10E3/uL   Neutrophils 64 Not Estab. %   Lymphs 29 Not Estab. %   Monocytes 7 Not Estab. %   Eos 0 Not Estab. %   Basos 0 Not Estab. %   Neutrophils Absolute 3.4 1.4 - 7.0 x10E3/uL   Lymphocytes Absolute 1.5 0.7 - 3.1 x10E3/uL     Monocytes Absolute 0.4 0.1 - 0.9 x10E3/uL   EOS (ABSOLUTE) 0.0 0.0 - 0.4 x10E3/uL   Basophils Absolute 0.0 0.0 - 0.2 x10E3/uL   Immature Granulocytes 0 Not Estab. %   Immature Grans (Abs) 0.0 0.0 - 0.1 x10E3/uL  Vitamin B12  Result Value Ref Range   Vitamin B-12 462 232 - 1,245 pg/mL  TSH + free T4  Result Value Ref Range   TSH 0.927 0.450 - 4.500 uIU/mL   Free T4 1.65 0.82 - 1.77 ng/dL  Hemoglobin E9BA1c  Result Value Ref Range   Hgb A1c MFr Bld 5.0 4.8 - 5.6 %   Est. average glucose Bld gHb Est-mCnc 97 mg/dL   No results found.  EKG EKG Interpretation  Date/Time:  Wednesday March 01 2018 14:29:41 EST Ventricular Rate:  75 PR Interval:    QRS Duration: 101 QT Interval:  382 QTC Calculation: 427 R Axis:   50 Text Interpretation:  Sinus rhythm Confirmed by Benjiman CorePickering, Nathan [id number]) on 03/01/2018 2:32:49 PM   Radiology Dg Chest 2 View  Result Date: 03/01/2018 CLINICAL DATA:  Chest pain EXAM: CHEST - 2 VIEW COMPARISON:  12/20/2017 FINDINGS: The heart  size and mediastinal contours are within normal limits. Both lungs are clear. The visualized skeletal structures are unremarkable. IMPRESSION: No active cardiopulmonary disease. Electronically Signed   By: Tollie Eth M.D.   On: 03/01/2018 15:40    Procedures Procedures (including critical care time)  Medications Ordered in ED Medications - No data to display   Initial Impression / Assessment and Plan / ED Course  I have reviewed the triage vital signs and the nursing notes.  Pertinent labs & imaging results that were available during my care of the patient were reviewed by me and considered in my medical decision making (see chart for details).   Patient presents to the emergency department with complaints of intermittent chest discomfort for the past 2.5 months.  Patient is nontoxic-appearing, no apparent distress, her blood pressure is noted to be elevated, doubt HTN emergency at this time.  She has a fairly benign  physical exam.  Her anterior chest wall is mildly tender to palpation.  Heart regular rate and rhythm, lungs clear to auscultation bilaterally.  Abdomen is nontender without peritoneal signs, I do not suspect cholecystitis, pancreatitis or other acute abdominal pathology. Her prior records have been reviewed w/ similar ER presentations including visit to [facility] ED last evening where she LWBS.  CBC: Anemia with hgb/hct similar to prior ranges over the past 6 months. No leukocytosis BMP: Mild hypokalemia at 3.4, electrolytes otherwise WNL. Renal function preserved.  Preg test: negative Troponin: Negative She has also had prior thyroid function testing which has been normal as well as negative H pylori breath test.   No significant change in her sxs in the ER today- will obtain EKG & CXR EKG: NSR, no STEMI. Doubt ACS.  CXR: Negative for infiltrate, edema, effusion, or pneumothorax.   Sxs intermittent for 2.5 months- low risk wells, doubt PE. No widening of mediastinum on xray, symmetric pulses, doubt dissection. Unclear definitive etiology, there may be an anxiety component, will give atarax prescription to try, follow up with cardiology & PCP. BP has normalized.  I discussed results, treatment plan, need for follow-up, and return precautions with the patient. Provided opportunity for questions, patient confirmed understanding and is in agreement with plan.   Findings and plan of care discussed with supervising physician Dr. Rubin Payor who is in agreement.   Vitals:   03/01/18 1602 03/01/18 1603  BP: 128/70 128/70  Pulse: 90 89  Resp: 13 18  Temp:    SpO2: 100% 100%    Final Clinical Impressions(s) / ED Diagnoses   Final diagnoses:  Chest pain, unspecified type    ED Discharge Orders         Ordered    hydrOXYzine (ATARAX/VISTARIL) 10 MG tablet  Every 6 hours PRN     03/01/18 [address], [place], PA-C 03/01/18 1627    Benjiman Core, MD 03/02/18 1507

## 2018-03-01 NOTE — ED Notes (Signed)
I called patient name for a room and no one responded 

## 2018-03-01 NOTE — Discharge Instructions (Addendum)
You were seen in the ER today for chest pain.  Your chest x-ray was normal.  Your EKG was reassuring.  Your potassium last night was mildly low at 3.4.  Your hemoglobin was somewhat low but consistent with prior hemoglobins on record.  Please utilize the attached guidelines for potassium supplementation in your diet.  We are sending you home with Atarax and medicine to help with anxiety, please take this as needed for when you are feeling anxious.  Please follow-up with primary care as well as with cardiology.  See primary care within the next 3 to 5 days.  Follow-up with cardiology as scheduled.  We have prescribed you new medication(s) today. Discuss the medications prescribed today with your pharmacist as they can have adverse effects and interactions with your other medicines including over the counter and prescribed medications. Seek medical evaluation if you start to experience new or abnormal symptoms after taking one of these medicines, seek care immediately if you start to experience difficulty breathing, feeling of your throat closing, facial swelling, or rash as these could be indications of a more serious allergic reaction  Return to the ER for new or worsening symptoms or any other concerns.

## 2018-03-01 NOTE — ED Triage Notes (Signed)
pt c/o chest pain x 3 days , seen at [facility] Ed last night for same. Labs and EKG neg. C/o increased stress

## 2018-03-03 ENCOUNTER — Telehealth: Payer: Self-pay | Admitting: Family Medicine

## 2018-03-03 NOTE — Telephone Encounter (Signed)
Contacted patient and notified of appt schedule for 02/24. Scheduled appt. Will be the soonest available for a HFU

## 2018-03-03 NOTE — Telephone Encounter (Signed)
Patient called with a complaint of lip swelling.  Patient has recently started  two (2) months ago on  lisinopril-hydrochlorothiazide.  Patient denied shortness of breath or any other symptom.  Patient advised to go to the Emergency Room or Urgent Care to be evaluated.

## 2018-03-03 NOTE — Telephone Encounter (Signed)
Patient needs to be scheduled an OV per PCP for ED FU

## 2018-03-03 NOTE — Telephone Encounter (Signed)
Please ask patient to make office visit next week with her PCP in follow-up of ED/urgent care visit as well as blood pressure follow-up

## 2018-03-03 NOTE — Telephone Encounter (Signed)
Pt called in stating she is having allergic reaction lip is swollen on left side and feeling numb

## 2018-03-06 ENCOUNTER — Ambulatory Visit: Payer: Self-pay | Attending: Family Medicine | Admitting: Family Medicine

## 2018-03-06 ENCOUNTER — Encounter: Payer: Self-pay | Admitting: Family Medicine

## 2018-03-06 VITALS — BP 128/75 | HR 93 | Temp 98.8°F | Resp 18 | Ht 70.0 in | Wt 221.0 lb

## 2018-03-06 DIAGNOSIS — R0789 Other chest pain: Secondary | ICD-10-CM

## 2018-03-06 DIAGNOSIS — I10 Essential (primary) hypertension: Secondary | ICD-10-CM

## 2018-03-06 DIAGNOSIS — F418 Other specified anxiety disorders: Secondary | ICD-10-CM

## 2018-03-06 DIAGNOSIS — R4589 Other symptoms and signs involving emotional state: Secondary | ICD-10-CM

## 2018-03-06 NOTE — Patient Instructions (Signed)

## 2018-03-06 NOTE — Progress Notes (Signed)
Patient has been neurology and has cardiology appointment scheduled for 03/20/2018. Patient would like to discuss hydroxyzine prior to taking.

## 2018-03-06 NOTE — Progress Notes (Signed)
Subjective:    Patient ID: Diane Lloyd, female    DOB: 05/26/1990, 28 y.o.   MRN: [id number]  HPI       28 yo female seen in follow-up of hypertension as well as complaint of episodes of left sided and sometimes right sided chest pain and areas of numbness in various parts on her body which occurs randomly. She was recently seen in the ED on 03/01/2018 due to recurrent chest pain which started in December of 2019.  Patient states that she was prescribed hydroxyzine in the emergency department but states that she is not sure if she should take this medication.  Patient does have upcoming appointment with cardiology regarding her recurrent chest pain and patient also with sensation of palpitations.  Patient states that she is not quite sure why she was given the hydroxyzine.  Patient does admit that she does have issues with anxiety as she worries that there may be something seriously wrong with her heart or that she may have another undiagnosed medical condition.  Patient is accompanied by her mother at today's visit and patient's mother states that patient has been anxious for long time.  Patient states that the chest pain when it occurs is about a 3-4 on a 0-to-10 scale and is a pressure sensation/dull and aching.  Chest pain can last from 5 minutes up to an hour and sometimes resolves on its own but sometimes patient gets relief of pain by taking Tylenol.      Patient is taking the lisinopril hydrochlorothiazide for her blood pressure and patient denies any issues with the cough and no muscle cramping associated with the medication.  Patient additionally is on a birth control pill.  Patient has had no sensation of acute shortness of breath, no pain in the upper or lower extremities.  Patient reports that sometimes she does feel as if she has some brief tingling in her fingertips or in her feet.  Patient also at times feels some tingling/numbness around her mouth.  Patient states that when she feels  anxious, she tends to have the sensation of increased heart rate or palpitations along with nausea and she will feel lightheaded.  Patient states that if she lies down to rest she will start to feel better and the numbness and increased heart rate will go away.  Patient is not sure why she is having issues with anxiety.  Review of Systems  Constitutional: Positive for fatigue. Negative for chills and fever.  HENT: Negative for congestion, sore throat and trouble swallowing.   Eyes: Negative for photophobia and visual disturbance.  Respiratory: Negative for cough and shortness of breath.   Cardiovascular: Positive for chest pain and palpitations. Negative for leg swelling.  Gastrointestinal: Negative for abdominal pain, blood in stool, constipation, diarrhea and nausea.  Endocrine: Negative for polydipsia, polyphagia and polyuria.  Genitourinary: Negative for dysuria and frequency.  Musculoskeletal: Negative for arthralgias, back pain and gait problem.  Neurological: Positive for light-headedness and numbness. Negative for headaches.  Hematological: Negative for adenopathy. Does not bruise/bleed easily.  Psychiatric/Behavioral: Positive for sleep disturbance. Negative for self-injury and suicidal ideas. The patient is nervous/anxious.        Objective:   Physical Exam BP 128/75 (BP Location: Right Arm, Patient Position: Sitting, Cuff Size: Large)   Pulse 93   Temp 98.8 F (37.1 C) (Oral)   Resp 18   Ht 5\' 10"  (1.778 m)   Wt 221 lb (100.2 kg)   LMP 03/06/2018  SpO2 98%   BMI 31.71 kg/m Nurse's notes and vital signs reviewed General-well-nourished, well-developed slightly overweight for height young adult female in no acute distress.  Patient is accompanied by her mother at today's visit.  Patient does appear to be anxious at times. ENT-TMs gray, nares with mild edema of the nasal turbinates, normal oropharynx with mild posterior pharynx erythema.  Neck-supple, no lymphadenopathy, no  appreciable thyromegaly Cardiovascular-regular rate and rhythm Lungs-clear to auscultation bilaterally, no wheeze, breathing is nonlabored Abdomen-soft, nontender Back-no CVA tenderness Extremities-no edema Neuro-cranial nerves II through XII are grossly intact, negative Tinel and Phalen signs at the wrists bilaterally Psych- patient appears anxious and often speaks in a negative manner regarding her health Musculoskeletal- patient with reproducible left upper chest pain/chest wall pain with abduction of her left upper arm in a position as if she is being sworn into court.  This movement causes stretching of the pectoral muscles in the chest wall and patient had discomfort in that area with arm movement as described       Assessment & Plan:  1. Left-sided chest wall pain Patient with some reproducible left-sided chest wall pain on exam.  Discussed with patient the possibility that she may have some costochondritis or musculoskeletal chest pain.  Patient may take Tylenol as needed.  Patient is encouraged to keep her follow-up appointment with cardiology.  Also discussed with the patient that her recent EKG done in the emergency department was normal  2. Anxiety about health Patient admits to longstanding issues with being anxious/having anxiety particularly about her health.  Patient is under the impression that she may have an undetected/undiagnosed illness.  Patient is encouraged to keep her upcoming appointment with cardiology and neurology for further evaluation and reassurance.  Patient was also asked to ask try the hydroxyzine that was prescribed in the emergency department to help when she starts to feel more anxious.  Discussed a daily medication such as sertraline or BuSpar to help with anxiety but patient is not yet ready to start a medication at this time.  Patient was also encouraged to consider counseling.  Patient was also offered social work referral which she will consider.  Also  discussed with the patient to try slow deep breaths when she is feeling anxious as hyperventilating/breathing quickly may be causing her perioral numbness as well as a sensation of numbness in multiple areas of her body when she is feeling more anxious  3. Essential hypertension Discussed with the patient that her blood pressure is very well controlled at today's visit with a value of 128/75 and patient's goal blood pressure is 130/80 or less.  Patient is encouraged to continue a Dash type diet and continue her lisinopril hydrochlorothiazide.  Patient's notes and labs from the emergency department visit on 03/01/2018 were reviewed with the patient.  Patient with a potassium of 3.4 and patient reports that she has increased potassium rich foods in her diet.  An After Visit Summary was printed and given to the patient.  Allergies as of 03/06/2018      Reactions   Diflucan [fluconazole] Itching, Rash      Medication List       Accurate as of March 06, 2018 11:59 PM. Always use your most recent med list.        acetaminophen 500 MG tablet Commonly known as:  TYLENOL Take 500 mg by mouth every 6 (six) hours as needed for headache.   escitalopram 10 MG tablet Commonly known as:  LEXAPRO Take  10 mg by mouth daily.   hydrOXYzine 10 MG tablet Commonly known as:  ATARAX/VISTARIL Take 1 tablet (10 mg total) by mouth every 6 (six) hours as needed.   lisinopril-hydrochlorothiazide 10-12.5 MG tablet Commonly known as:  PRINZIDE,ZESTORETIC Take 1 tablet by mouth daily. To lower blood pressure   Sprintec 28 0.25-35 MG-MCG tablet Generic drug:  norgestimate-ethinyl estradiol Take 1 tablet by mouth daily.      Return in about 6 weeks (around 04/17/2018) for chest pain/blood pressure.

## 2018-03-10 ENCOUNTER — Ambulatory Visit: Payer: Medicaid Other | Admitting: Family

## 2018-03-10 ENCOUNTER — Encounter

## 2018-03-10 ENCOUNTER — Telehealth: Payer: Self-pay

## 2018-03-10 NOTE — Telephone Encounter (Signed)
I called patient yesterday afternoon and again this morning and left message to return call to clinic to let us know if she still needed appointment or not since she saw Dr. Jillyn Hidden recently.

## 2018-03-19 NOTE — Progress Notes (Signed)
Cardiology Office Note    Date:  03/20/2018   ID:  Diane Lloyd, DOB February 26, 1990, MRN [id number]  PCP:  Cain Saupe, MD  Cardiologist:  Armanda Magic, MD   Chief Complaint  Patient presents with  . Palpitations    History of Present Illness:  Diane Lloyd is a 28 y.o. female who is being seen today for the evaluation of chest pain and palpitations at the request of Fulp, Cammie, MD.  This is a 28 year old female with a history of chronic anemia, asthma, hypertension, PCOS and obesity.  She was seen in the emergency room on 03/01/2018 with complaints of intermittent chest pain and palpitations.  This has been going on since December.  It is somewhat atypical.  She says it occurs in multiple locations and one time can be on the left side and then on the right side of her chest wall or in the epigastric area.  At the time of her ER visit she was complaining of epigastric substernal pain that improved with Tylenol taken before she went to the ER.  She has been stressed over the past few months and increased anxiety which she thinks makes her symptoms worse.  The pain is not aggravated by movement or deep inspiration.  She does notice that her heart will race and she feels nauseated as well as lightheaded at times but not necessarily during the episodes of chest pain.  She says it can happen 10-15 times per month and last usually 5 to 10 minutes at a time but has lasted as long as an hour in the past.  She has not had any syncope.  She says she has noticed the palpitations are worse when she goes from sitting to standing but can also occur when she sitting or laying down.  These occur on a daily basis.  She denies any alcohol use but does drink 2 cans of soda a day and thinks she may not be staying as hydrated as normal.  In regards to the chest pain she tells me that it occurs across her chest and is more like a pressure that will occur in her epigastrium and radiate into her back.  There is  associated nausea.  She says she is taken Tums in the past without significant improvement.  She denies any tobacco use and she has no family history of MI except for your uncle.  She has been to the ER several times with negative work-up.  In review she has had ER visits since December for chest pain as well as headaches.  Her last ER visit her troponin was 0 and labs unremarkable except for a potassium of 3.4 and a hemoglobin of 10.4.  T SH normal at 1.65 hemoglobin A1c 5.  E KG shows normal sinus rhythm with no ST changes on 03/01/2018.  2D echo in 2013 was normal.  She is now here for further evaluation.  Past Medical History:  Diagnosis Date  . Anemia    iron def  . Asthma   . Hypertension   . Lupus (HCC)    + ANA in 2012  . Obesity, unspecified   . PCOS (polycystic ovarian syndrome)     Past Surgical History:  Procedure Laterality Date  . TONSILLECTOMY      Current Medications: Current Meds  Medication Sig  . acetaminophen (TYLENOL) 500 MG tablet Take 500 mg by mouth every 6 (six) hours as needed for headache.  . lisinopril-hydrochlorothiazide (PRINZIDE,ZESTORETIC) 10-12.5 MG tablet  Take 1 tablet by mouth daily. To lower blood pressure  . SPRINTEC 28 0.25-35 MG-MCG tablet Take 1 tablet by mouth daily.     Allergies:   Diflucan [fluconazole]   Social History   Socioeconomic History  . Marital status: Single    Spouse name: Not on file  . Number of children: 0  . Years of education: Not on file  . Highest education level: Some college, no degree  Occupational History  . Occupation: CSR    Employer: [employer]  Social Needs  . Financial resource strain: Not on file  . Food insecurity:    Worry: Not on file    Inability: Not on file  . Transportation needs:    Medical: Not on file    Non-medical: Not on file  Tobacco Use  . Smoking status: Never Smoker  . Smokeless tobacco: Never Used  Substance and Sexual Activity  . Alcohol use: Yes    Comment: occasional  .  Drug use: Not Currently    Types: Marijuana    Comment: LAST TIME- 2 YEARS AGO  . Sexual activity: Yes    Birth control/protection: Pill  Lifestyle  . Physical activity:    Days per week: Not on file    Minutes per session: Not on file  . Stress: Not on file  Relationships  . Social connections:    Talks on phone: Not on file    Gets together: Not on file    Attends religious service: Not on file    Active member of club or organization: Not on file    Attends meetings of clubs or organizations: Not on file    Relationship status: Not on file  Other Topics Concern  . Not on file  Social History Narrative   Patient is right-handed. She lives with her mother and uncle in a one level home. She occasionally drinks caffeine. She does not exercise.     Family History:  The patient's family history includes Asthma in her father; Cancer (age of onset: 46) in her maternal uncle; Cancer (age of onset: 48) in her father; Cancer (age of onset: 10) in her paternal grandmother; Diabetes in her mother; Hypertension in her mother; Lupus in her mother.   ROS:   Please see the history of present illness.    ROS All other systems reviewed and are negative.  No flowsheet data found.     PHYSICAL EXAM:   VS:  BP (!) 152/88   Pulse 100   Ht  (1.778 m)   Wt 228 lb 12.8 oz (103.8 kg)   LMP 03/06/2018   BMI 32.83 kg/m     Orthostatic VS for the past 24 hrs (Last 3 readings):  BP- Lying Pulse- Lying BP- Sitting Pulse- Sitting BP- Standing at 0 minutes Pulse- Standing at 0 minutes BP- Standing at 3 minutes Pulse- Standing at 3 minutes  03/20/18 0834 127/82 95 135/86 88 140/86 97 149/88 102    GEN: Well nourished, well developed, in no acute distress  HEENT: normal  Neck: no JVD, carotid bruits, or masses Cardiac: RRR; no murmurs, rubs, or gallops,no edema.  Intact distal pulses bilaterally.  Respiratory:  clear to auscultation bilaterally, normal work of breathing GI: soft,  nontender, nondistended, + BS MS: no deformity or atrophy  Skin: warm and dry, no rash Neuro:  Alert and Oriented x 3, Strength and sensation are intact Psych: euthymic mood, full affect  Wt Readings from Last 3 Encounters:  03/20/18 228  lb 12.8 oz (103.8 kg)  03/06/18 221 lb (100.2 kg)  03/01/18 227 lb 1.2 oz (103 kg)      Studies/Labs Reviewed:   EKG:  EKG is not ordered today.  Recent Labs: 02/06/2018: ALT 20; TSH 0.927 02/28/2018: BUN 13; Creatinine, Ser 0.89; Hemoglobin 10.4; Platelets 350; Potassium 3.4; Sodium 135   Lipid Panel    Component Value Date/Time   CHOL 135 12/12/2015 1441   TRIG 158 (H) 12/12/2015 1441   HDL 36 (L) 12/12/2015 1441   CHOLHDL 3.8 12/12/2015 1441   VLDL 32 (H) 12/12/2015 1441   LDLCALC 67 12/12/2015 1441    Additional studies/ records that were reviewed today include:  Office notes and hospital notes, labs, 2D echo 2013    ASSESSMENT:    1. Chest pain, unspecified type   2. Essential hypertension   3. Class 2 severe obesity due to excess calories with serious comorbidity in adult, unspecified BMI (HCC)      PLAN:  In order of problems listed above:  1.  Chest pain - her pain is  atypical in that it can occur at multiple areas of her chest.  She will occasionally have some problems with nausea and break out in a sweat with the discomfort.  The pain will go from her epigastric area into her back..  Her EKG in the ER was normal.  She thinks her discomfort is worse when she gets anxious.  She has never smoked and she has no family history of heart disease except for an uncle.  I have recommended getting an exercise treadmill test to rule out ischemia which in her age group would mainly be due to anomalous coronary artery takeoff.  I have also recommended a 2D echocardiogram to assess for structural heart disease as well as pericardial effusion.  2.  Hypertension -BP is borderline controlled on exam today.  She will continue on lisinopril HCT  10-12.5 mg daily.  Her blood pressure is followed by her PCP.  3.  Palpitations - these occur on a daily basis and are random in occurrence although seem to be more prominent when she goes from sitting to standing.  I suspect she may be overall volume depleted as she says she probably does not drink as much as she should and drinks 2 glasses of caffeinated soda daily.  She does not drink alcohol.  The orthostatic blood pressures were normal.  If event monitor is normal then would consider starting low-dose Toprol-XL given that her blood pressure is borderline elevated as well.  I recommended getting an event monitor to evaluate for arrhythmias.    Medication Adjustments/Labs and Tests Ordered: Current medicines are reviewed at length with the patient today.  Concerns regarding medicines are outlined above.  Medication changes, Labs and Tests ordered today are listed in the Patient Instructions below.  There are no Patient Instructions on file for this visit.   Signed, Armanda Magic, MD  03/20/2018 8:22 AM    [facility] [address] [place], [place], [place]  [address] Phone: [phone]; Fax: [phone]

## 2018-03-20 ENCOUNTER — Encounter: Payer: Self-pay | Admitting: Cardiology

## 2018-03-20 ENCOUNTER — Ambulatory Visit (INDEPENDENT_AMBULATORY_CARE_PROVIDER_SITE_OTHER): Payer: Self-pay | Admitting: Cardiology

## 2018-03-20 ENCOUNTER — Encounter

## 2018-03-20 VITALS — BP 152/88 | HR 100 | Ht 70.0 in | Wt 228.8 lb

## 2018-03-20 DIAGNOSIS — R002 Palpitations: Secondary | ICD-10-CM

## 2018-03-20 DIAGNOSIS — R079 Chest pain, unspecified: Secondary | ICD-10-CM

## 2018-03-20 DIAGNOSIS — I10 Essential (primary) hypertension: Secondary | ICD-10-CM

## 2018-03-20 NOTE — Patient Instructions (Signed)
Medication Instructions:  Your physician recommends that you continue on your current medications as directed. Please refer to the Current Medication list given to you today.  If you need a refill on your cardiac medications before your next appointment, please call your pharmacy.   Lab work: None If you have labs (blood work) drawn today and your tests are completely normal, you will receive your results only by: Marland Kitchen MyChart Message (if you have MyChart) OR . A paper copy in the mail If you have any lab test that is abnormal or we need to change your treatment, we will call you to review the results.  Testing/Procedures: Your physician has requested that you have an exercise tolerance test. For further information please visit [email or web address] Please also follow instruction sheet, as given.  Your physician has requested that you have an echocardiogram. Echocardiography is a painless test that uses sound waves to create images of your heart. It provides your doctor with information about the size and shape of your heart and how well your heart's chambers and valves are working. This procedure takes approximately one hour. There are no restrictions for this procedure.  Your physician has recommended that you wear an event monitor. Event monitors are medical devices that record the heart's electrical activity. Doctors most often [place] these monitors to diagnose arrhythmias. Arrhythmias are problems with the speed or rhythm of the heartbeat. The monitor is a small, portable device. You can wear one while you do your normal daily activities. This is usually used to diagnose what is causing palpitations/syncope (passing out).  Follow-Up: As needed follow up.

## 2018-03-30 ENCOUNTER — Ambulatory Visit: Payer: Medicaid Other | Admitting: Nurse Practitioner

## 2018-04-05 ENCOUNTER — Telehealth: Payer: Self-pay | Admitting: Cardiovascular Disease

## 2018-04-05 NOTE — Telephone Encounter (Signed)
     Primary Cardiologist:  Turner    Patient contacted.  History reviewed.  No symptoms to suggest any unstable cardiac conditions.  Based on discussion, with current pandemic situation, we will be postponing this appointment for [name] with a plan for f/u in  8-12  wks or sooner if feasible/necessary.  If symptoms change, she has been instructed to contact our office.   Routing to C19 CANCEL pool for tracking (P CV DIV CV19 CANCEL - reason for visit "other.")   assigning priority (3 = >12 wks).    Still having some CP .   Better, Related to eating and drinking  Does not exercise No CP with climbing steps.   Eats take out food frequently - not as much for the past month Does not smoke  Advised diet , exercise, wt loss program Advised avoiding greasy foods and fast foods.  Please cancel the GXT and the event monitor for Friday , March 24  She has been told that she will be contacted in 12 weeks to see how she is doing and if she needs to be rescheduled.    Kristeen Miss, MD  04/05/2018 12:02 PM

## 2018-04-07 ENCOUNTER — Ambulatory Visit: Payer: Self-pay | Admitting: *Deleted

## 2018-04-07 ENCOUNTER — Emergency Department (HOSPITAL_COMMUNITY)
Admission: EM | Admit: 2018-04-07 | Discharge: 2018-04-07 | Disposition: A | Discharge status: Home / Self Care | Payer: Medicaid Other | Attending: Emergency Medicine | Admitting: Emergency Medicine

## 2018-04-07 ENCOUNTER — Encounter (HOSPITAL_COMMUNITY): Payer: Self-pay

## 2018-04-07 ENCOUNTER — Telehealth: Payer: Self-pay | Admitting: Family Medicine

## 2018-04-07 ENCOUNTER — Ambulatory Visit (HOSPITAL_COMMUNITY): Payer: Medicaid Other

## 2018-04-07 ENCOUNTER — Other Ambulatory Visit: Payer: Self-pay

## 2018-04-07 ENCOUNTER — Ambulatory Visit (HOSPITAL_COMMUNITY)
Admission: EM | Admit: 2018-04-07 | Discharge: 2018-04-07 | Disposition: A | Discharge status: Home / Self Care | Payer: Medicaid Other | Attending: Family Medicine | Admitting: Family Medicine

## 2018-04-07 DIAGNOSIS — M3210 Systemic lupus erythematosus, organ or system involvement unspecified: Secondary | ICD-10-CM | POA: Insufficient documentation

## 2018-04-07 DIAGNOSIS — J45909 Unspecified asthma, uncomplicated: Secondary | ICD-10-CM | POA: Insufficient documentation

## 2018-04-07 DIAGNOSIS — R0789 Other chest pain: Secondary | ICD-10-CM

## 2018-04-07 DIAGNOSIS — I10 Essential (primary) hypertension: Secondary | ICD-10-CM | POA: Insufficient documentation

## 2018-04-07 DIAGNOSIS — F418 Other specified anxiety disorders: Secondary | ICD-10-CM

## 2018-04-07 DIAGNOSIS — M79601 Pain in right arm: Secondary | ICD-10-CM

## 2018-04-07 DIAGNOSIS — R079 Chest pain, unspecified: Secondary | ICD-10-CM

## 2018-04-07 DIAGNOSIS — R200 Anesthesia of skin: Secondary | ICD-10-CM

## 2018-04-07 DIAGNOSIS — Z79899 Other long term (current) drug therapy: Secondary | ICD-10-CM | POA: Insufficient documentation

## 2018-04-07 DIAGNOSIS — R202 Paresthesia of skin: Secondary | ICD-10-CM

## 2018-04-07 NOTE — Discharge Instructions (Addendum)
Your EKG was normal I am not seeing any concerning signs or symptoms on exam This could be anxiety about your situation  If you are still worried I would recommend going to the ER for further evaluation and treatment Otherwise keep your appointment with your cardiologist as planned

## 2018-04-07 NOTE — ED Triage Notes (Signed)
Pt from home; c/o R arm pain that began last night, described as a dull ache; denies injury; also c/o intermittent generalized CP that started in early December; pt states she was supposed to have a cardiology appointment today but appointment was rescheduled; CP 5/10, described as pressure; pt went to urgent care this afternoon, sent here for further evaluation

## 2018-04-07 NOTE — Telephone Encounter (Signed)
Attempt to call patient to make an appointment. Pt informs writer she was already in the UC/ ED.

## 2018-04-07 NOTE — ED Provider Notes (Signed)
[facility]    CSN: [id number] Arrival date & time: 04/07/18  1227     History   Chief Complaint Chief Complaint  Patient presents with  . Arm Pain  . Chest Pain    HPI Diane Lloyd is a 28 y.o. female.   Pt is a 28 year old female that presents with right-sided chest pain, arm pain.  This has been an ongoing problem for her for approximately 2 months.  She has had multiple work-ups in the ER and  been seen by cardiology for this since.  She is here today with concerns because the chest pain restarted today.  She is having some radiation of pain down her right arm with numbness and tingling.  The symptoms somewhat subsided after taking Tylenol and resting.  She is not currently having any chest pain or arm pain. Symptoms have resolved. Denies any recent strenuous activity or heavy lifting.  Patient does have a history of anxiety.  She was scheduled to have an appointment with cardiology today to do echocardiogram, stress test and have a Holter monitor placed.  This appointment was postponed due to the coronavirus pandemic.  She is very anxious that this did not occur and is still concerned about why she is having intermittent chest pain.  Denies any dizziness, headaches, vision changes.  Denies any shortness of breath or palpitations.  Denies any recent sick contacts or recent traveling.  No cough, congestion, fevers, chills, body aches.  No calf pain, swelling.  No history of DVTs or PEs.  ROS per HPI      Past Medical History:  Diagnosis Date  . Anemia    iron def  . Asthma   . Hypertension   . Lupus (HCC)    + ANA in 2012  . Obesity, unspecified   . PCOS (polycystic ovarian syndrome)     Patient Active Problem List   Diagnosis Date Noted  . Vaginal candidiasis 12/18/2015  . Diarrhea 11/10/2015  . Gastroenteritis 09/24/2015  . Screen for STD (sexually transmitted disease) 09/21/2015  . Rash and nonspecific skin eruption 08/31/2015  . Dysmenorrhea  08/31/2015  . Allergic rhinitis 05/17/2013  . Lupus (HCC)   . GERD (gastroesophageal reflux disease) 03/20/2012  . Polycystic ovarian syndrome 12/26/2011  . Chest pain 11/01/2011  . Family history of cardiac disorder 02/17/2011  . Contraception management 12/30/2010  . 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 Essential hypertension 01/27/2010  . ANEMIA, IRON DEFICIENCY 06/27/2009  . Obesity 03/10/2006  . ASTHMA, INTERMITTENT 03/10/2006  . ECZEMA, ATOPIC DERMATITIS 03/10/2006    Past Surgical History:  Procedure Laterality Date  . TONSILLECTOMY      OB History    Gravida  0   Para      Term      Preterm      AB      Living        SAB      TAB      Ectopic      Multiple      Live Births               Home Medications    Prior to Admission medications   Medication Sig Start Date End Date Taking? Authorizing Provider  acetaminophen (TYLENOL) 500 MG tablet Take 500 mg by mouth every 6 (six) hours as needed for headache.    [provider]  lisinopril-hydrochlorothiazide (PRINZIDE,ZESTORETIC) 10-12.5 MG tablet Take 1 tablet by mouth daily.  To lower blood pressure 02/06/18   Fulp, Cammie, MD  SPRINTEC 28 0.25-35 MG-MCG tablet Take 1 tablet by mouth daily.  08/26/17   [provider]    Family History Family History  Problem Relation Age of Onset  . Cancer Father 19       esophagus  . Asthma Father   . Hypertension Mother   . Lupus Mother   . Diabetes Mother   . Cancer Maternal Uncle 40       pancreatic cancer  . Cancer Paternal Grandmother 23       esophageal    Social History Social History   Tobacco Use  . Smoking status: Never Smoker  . Smokeless tobacco: Never Used  Substance Use Topics  . Alcohol use: Yes    Comment: occasional  . Drug use: Not Currently    Types: Marijuana    Comment: LAST TIME- 2 YEARS AGO     Allergies   Diflucan [fluconazole]   Review of Systems Review of Systems    Physical Exam Triage Vital Signs ED Triage Vitals  Enc Vitals Group     BP 04/07/18 1306 (!) 155/93     Pulse Rate 04/07/18 1306 61     Resp 04/07/18 1306 16     Temp 04/07/18 1306 98.6 F (37 C)     Temp Source 04/07/18 1306 Oral     SpO2 04/07/18 1306 97 %     Weight --      Height --      Head Circumference --      Peak Flow --      Pain Score 04/07/18 1307 3     Pain Loc --      Pain Edu? --      Excl. in GC? --    No data found.  Updated Vital Signs BP (!) 155/93 (BP Location: Right Arm)   Pulse 61   Temp 98.6 F (37 C) (Oral)   Resp 16   SpO2 97%   Visual Acuity Right Eye Distance:   Left Eye Distance:   Bilateral Distance:    Right Eye Near:   Left Eye Near:    Bilateral Near:     Physical Exam Vitals signs and nursing note reviewed.  Constitutional:      General: She is not in acute distress.    Appearance: She is well-developed. She is not ill-appearing, toxic-appearing or diaphoretic.  HENT:     Head: Normocephalic and atraumatic.  Eyes:     Conjunctiva/sclera: Conjunctivae normal.  Neck:     Musculoskeletal: Normal range of motion and neck supple.  Cardiovascular:     Rate and Rhythm: Normal rate and regular rhythm.     Heart sounds: Normal heart sounds. No murmur.  Pulmonary:     Effort: Pulmonary effort is normal. No respiratory distress.     Breath sounds: Normal breath sounds.  Chest:     Chest wall: No tenderness.  Musculoskeletal: Normal range of motion.     Right lower leg: She exhibits no tenderness. No edema.     Left lower leg: She exhibits no tenderness. No edema.  Skin:    General: Skin is warm and dry.  Neurological:     General: No focal deficit present.     Mental Status: She is alert.     Comments: No focal neuro deficits Strength 5/5 in all extremities Cranial nerves grossly intact  Psychiatric:        Mood and  Affect: Mood normal.      UC Treatments / Results  Labs (all labs ordered are listed, but only  abnormal results are displayed) Labs Reviewed - No data to display  EKG None  Radiology No results found.  Procedures Procedures (including critical care time)  Medications Ordered in UC Medications - No data to display  Initial Impression / Assessment and Plan / UC Course  I have reviewed the triage vital signs and the nursing notes.  Pertinent labs & imaging results that were available during my care of the patient were reviewed by me and considered in my medical decision making (see chart for details).     Pt here with chronic chest pain that has been going on for months She had another episode of this today. Symptoms have since resolved. She is very anxious.  She is not currently having any chest pain, arm pain or any worrisome symptoms. Her vital signs are stable and she is nontoxic or ill-appearing.  Blood pressure slightly elevated but I believe this is due to her anxiety. She was supposed to see cardiology today and have echocardiogram, stress testing and Holter monitor placed.  This scheduled appointment was postponed until May due to the pandemic. Symptoms started shortly after finding out this information.  I believe that her symptoms are anxiety related. EKG here was normal sinus rhythm with normal rate No concerning signs or symptoms on exam.  Neurological exam normal. Reassured patient that I do not believe this is cardiac related. Instructed that if her symptoms return to or she became more anxious about the situation then she would need to go to the ER for further evaluation and treatment Pt understanding and agreed.   Final Clinical Impressions(s) / UC Diagnoses   Final diagnoses:  Chest pain, unspecified type     Discharge Instructions     Your EKG was normal I am not seeing any concerning signs or symptoms on exam This could be anxiety about your situation  If you are still worried I would recommend going to the ER for further evaluation and treatment  Otherwise keep your appointment with your cardiologist as planned    ED Prescriptions    None     Controlled Substance Prescriptions [place] Controlled Substance Registry consulted? Not Applicable   Janace Aris, NP 04/07/18 1507

## 2018-04-07 NOTE — Telephone Encounter (Signed)
Pt called in c/o right arm discomfort/pain that started last night.  "I googled it and it said I could be having a stroke so I'm concerned".    She has had this happen before but not as long as this however she has never had it checked.  She is switching from Dr. Jillyn Hidden to Ria Clock, NP.     She has an appt to get established in May however the doctors are not seeing anyone in the office due to the coronavirus pandemic.   Due to this and not being an established pt I referred her to the urgent care/ED.  She agreed to go to the [facility]. Reason for Disposition . [1] MODERATE pain (e.g., interferes with normal activities) AND [2] present > 3 days  Answer Assessment - Initial Assessment Questions 1. ONSET: "When did the pain start?"     I 'm having pain in my right arm.   Started last night and is a dull pain that goes down my arm into my fingers.   I've had this before but it never went this long.    2. LOCATION: "Where is the pain located?"     Pain on top of my forearm and goes into my fingers at times it is from my shoulder down.     3. PAIN: "How bad is the pain?" (Scale 1-10; or mild, moderate, severe)   - MILD (1-3): doesn't interfere with normal activities   - MODERATE (4-7): interferes with normal activities (e.g., work or school) or awakens from sleep   - SEVERE (8-10): excruciating pain, unable to do any normal activities, unable to hold a cup of water     It's not hurting now.   It's more a discomfort than a pain. 4. WORK OR EXERCISE: "Has there been any recent work or exercise that involved this part of the body?"     No.   Maybe I slept on it wrong.   I took some Tylenol last night for the pain.   I took some aspirin this morning.   The discomfort is not coming as often. 5. CAUSE: "What do you think is causing the arm pain?"     I really don't know. 6. OTHER SYMPTOMS: "Do you have any other symptoms?" (e.g., neck pain, swelling, rash, fever, numbness, weakness)     No  swelling or numbness. 7. PREGNANCY: "Is there any chance you are pregnant?" "When was your last menstrual period?"     No.  Having it now.  Protocols used: ARM PAIN-A-AH

## 2018-04-07 NOTE — ED Provider Notes (Signed)
[facility] EMERGENCY DEPARTMENT Provider Note   CSN: [id number] Arrival date & time: 04/07/18  1430    History   Chief Complaint Chief Complaint  Patient presents with  . Chest Pain  . Arm Pain    HPI Diane Lloyd is a 28 y.o. female.     Right-sided chest pain with radiation to the right arm intermittently for the past 24 hours.  She has had chest pain for last 2 months and has had several evaluations.  Was seen in the urgent care center today and transferred to the emergency department.  No dyspnea, diaphoresis, nausea.  Past medical history hypertension.  No diabetes or cigarette smoking.  No recent travel or prolonged immobilization.  She is symptom-free at this time.  Nothing makes symptoms better or worse.     Past Medical History:  Diagnosis Date  . Anemia    iron def  . Asthma   . Hypertension   . Lupus (HCC)    + ANA in 2012  . Obesity, unspecified   . PCOS (polycystic ovarian syndrome)     Patient Active Problem List   Diagnosis Date Noted  . Vaginal candidiasis 12/18/2015  . Diarrhea 11/10/2015  . Gastroenteritis 09/24/2015  . Screen for STD (sexually transmitted disease) 09/21/2015  . Rash and nonspecific skin eruption 08/31/2015  . Dysmenorrhea 08/31/2015  . Allergic rhinitis 05/17/2013  . Lupus (HCC)   . GERD (gastroesophageal reflux disease) 03/20/2012  . Polycystic ovarian syndrome 12/26/2011  . Chest pain 11/01/2011  . Family history of cardiac disorder 02/17/2011  . Contraception management 12/30/2010  . 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 Essential hypertension 01/27/2010  . ANEMIA, IRON DEFICIENCY 06/27/2009  . Obesity 03/10/2006  . ASTHMA, INTERMITTENT 03/10/2006  . ECZEMA, ATOPIC DERMATITIS 03/10/2006    Past Surgical History:  Procedure Laterality Date  . TONSILLECTOMY       OB History    Gravida  0   Para      Term      Preterm      AB      Living        SAB      TAB      Ectopic      Multiple      Live Births               Home Medications    Prior to Admission medications   Medication Sig Start Date End Date Taking? Authorizing Provider  acetaminophen (TYLENOL) 500 MG tablet Take 500 mg by mouth every 6 (six) hours as needed for headache.    [provider]  lisinopril-hydrochlorothiazide (PRINZIDE,ZESTORETIC) 10-12.5 MG tablet Take 1 tablet by mouth daily. To lower blood pressure 02/06/18   Fulp, Cammie, MD  SPRINTEC 28 0.25-35 MG-MCG tablet Take 1 tablet by mouth daily.  08/26/17   [provider]    Family History Family History  Problem Relation Age of Onset  . Cancer Father 3       esophagus  . Asthma Father   . Hypertension Mother   . Lupus Mother   . Diabetes Mother   . Cancer Maternal Uncle 40       pancreatic cancer  . Cancer Paternal Grandmother 16       esophageal    Social History Social History   Tobacco Use  . Smoking status: Never Smoker  . Smokeless tobacco: Never Used  Substance Use Topics  . Alcohol use:  Yes    Comment: occasional  . Drug use: Not Currently    Types: Marijuana    Comment: LAST TIME- 2 YEARS AGO     Allergies   Diflucan [fluconazole]   Review of Systems Review of Systems  All other systems reviewed and are negative.    Physical Exam Updated Vital Signs BP (!) 148/86   Pulse 88   Temp 99 F (37.2 C) (Oral)   Resp 18   Ht 5\' 11"  (1.803 m)   Wt 99.8 kg   SpO2 99%   BMI 30.68 kg/m   Physical Exam Vitals signs and nursing note reviewed.  Constitutional:      Appearance: She is well-developed.  HENT:     Head: Normocephalic and atraumatic.  Eyes:     Conjunctiva/sclera: Conjunctivae normal.  Neck:     Musculoskeletal: Neck supple.  Cardiovascular:     Rate and Rhythm: Normal rate and regular rhythm.  Pulmonary:     Effort: Pulmonary effort is normal.     Breath sounds: Normal breath sounds.  Abdominal:     General: Bowel sounds are  normal.     Palpations: Abdomen is soft.  Musculoskeletal: Normal range of motion.  Skin:    General: Skin is warm and dry.  Neurological:     Mental Status: She is alert and oriented to person, place, and time.  Psychiatric:        Behavior: Behavior normal.      ED Treatments / Results  Labs (all labs ordered are listed, but only abnormal results are displayed) Labs Reviewed - No data to display  EKG EKG Interpretation  Date/Time:  Friday April 07 2018 14:46:10 EDT Ventricular Rate:  96 PR Interval:    QRS Duration: 90 QT Interval:  350 QTC Calculation: 443 R Axis:   53 Text Interpretation:  Sinus rhythm Confirmed by Donnetta Hutching ([id number]) on 04/07/2018 3:42:19 PM   Radiology No results found.  Procedures Procedures (including critical care time)  Medications Ordered in ED Medications - No data to display   Initial Impression / Assessment and Plan / ED Course  I have reviewed the triage vital signs and the nursing notes.  Pertinent labs & imaging results that were available during my care of the patient were reviewed by me and considered in my medical decision making (see chart for details).        Patient presents with chest pain.  EKG normal.  She is extremely low risk for ACS or PE.  No further evaluation in the emergency department.  Previous records reviewed.  Final Clinical Impressions(s) / ED Diagnoses   Final diagnoses:  Chest pain, unspecified type    ED Discharge Orders    None       Donnetta Hutching, MD 04/07/18 1710

## 2018-04-07 NOTE — Discharge Instructions (Addendum)
EKG was normal.  I reviewed your tests from February. You were slightly anemic.  Chemical associated with a heart attack was completely normal.  Follow-up your primary care doctor.

## 2018-04-07 NOTE — Telephone Encounter (Signed)
Message from Abbi R Burchel sent at 04/07/2018 11:29 AM EDT   Summary: rt arm pain   Pt complaining of rt arm pain radiating into fingers since last night. Pt describes pain as a dull ache. Pt states she has taken tylenol and bayer asprin and is still experiencing pain. Please call pt to advise.   [phone]  *New pt appt scheduled prior to today's call with Ria Clock in May*

## 2018-04-07 NOTE — ED Triage Notes (Signed)
Per pt she has been having chest pain and arm pain for about 2 months and was suppose to see cardiologist today and was canceled until May. Pt has been having right arm pain with some chest pain today. No nausea or sob now.Pt also stated she has anxiety.

## 2018-04-07 NOTE — Telephone Encounter (Signed)
Patient called stating that her right arm has been hurting like a dull ache. Patient states she is not sure if she should be worried or if she needs to be seen. Please follow up.

## 2018-04-10 ENCOUNTER — Ambulatory Visit: Payer: Self-pay | Admitting: *Deleted

## 2018-04-10 ENCOUNTER — Other Ambulatory Visit (HOSPITAL_COMMUNITY): Payer: Medicaid Other

## 2018-04-10 ENCOUNTER — Encounter: Payer: Self-pay | Admitting: Family Medicine

## 2018-04-10 NOTE — Telephone Encounter (Signed)
Patient does not have PCP at this time- Home care recommended- patient to go to [email or web address] if symptoms persist for extended period of time. Reviewed symptoms of COVID-19 and recommendations with that as well.  Reason for Disposition . [1] Sore throat with cough/cold symptoms AND [2] present < 5 days  Answer Assessment - Initial Assessment Questions 1. ONSET: "When did the throat start hurting?" (Hours or days ago)      2 days ago 2. SEVERITY: "How bad is the sore throat?" (Scale 1-10; mild, moderate or severe)   - MILD (1-3):  doesn't interfere with eating or normal activities   - MODERATE (4-7): interferes with eating some solids and normal activities   - SEVERE (8-10):  excruciating pain, interferes with most normal activities   - SEVERE DYSPHAGIA: can't swallow liquids, drooling     4 3. STREP EXPOSURE: "Has there been any exposure to strep within the past week?" If so, ask: "What type of contact occurred?"      no 4.  VIRAL SYMPTOMS: "Are there any symptoms of a cold, such as a runny nose, cough, hoarse voice or red eyes?"      Mucus drainage, slight cough 5. FEVER: "Do you have a fever?" If so, ask: "What is your temperature, how was it measured, and when did it start?"     no 6. PUS ON THE TONSILS: "Is there pus on the tonsils in the back of your throat?"     no 7. OTHER SYMPTOMS: "Do you have any other symptoms?" (e.g., difficulty breathing, headache, rash)     Headache- off/on- started yesterday 8. PREGNANCY: "Is there any chance you are pregnant?" "When was your last menstrual period?"     No- LMP -ended 3 days ago  Protocols used: SORE THROAT-A-AH

## 2018-04-12 ENCOUNTER — Ambulatory Visit: Payer: Self-pay | Admitting: *Deleted

## 2018-04-12 NOTE — Telephone Encounter (Signed)
Patient states her cough started yesterday after using nasal spray. Patient states she is using Mucinex as well.Patient states she has started a low grade temperature. Advised patient as previous- treat symptoms- use access to E visit as needed and go to ED if she has breathing difficultly.  Reason for Disposition . Cough  Answer Assessment - Initial Assessment Questions 1. ONSET: "When did the cough begin?"      yesterday 2. SEVERITY: "How bad is the cough today?"      Not bad- not often 3. RESPIRATORY DISTRESS: "Describe your breathing."      Not effecting breathing 4. FEVER: "Do you have a fever?" If so, ask: "What is your temperature, how was it measured, and when did it start?"     99.3- oral , this morning 5. SPUTUM: "Describe the color of your sputum" (clear, white, yellow, green)     white 6. HEMOPTYSIS: "Are you coughing up any blood?" If so ask: "How much?" (flecks, streaks, tablespoons, etc.)     no 7. CARDIAC HISTORY: "Do you have any history of heart disease?" (e.g., heart attack, congestive heart failure)      High blood pressure 8. LUNG HISTORY: "Do you have any history of lung disease?"  (e.g., pulmonary embolus, asthma, emphysema)     Childhood asthma 9. PE RISK FACTORS: "Do you have a history of blood clots?" (or: recent major surgery, recent prolonged travel, bedridden)     no 10. OTHER SYMPTOMS: "Do you have any other symptoms?" (e.g., runny nose, wheezing, chest pain)       no 11. PREGNANCY: "Is there any chance you are pregnant?" "When was your last menstrual period?"       no 12. TRAVEL: "Have you traveled out of the country in the last month?" (e.g., travel history, exposures)       No travel- no known exposure  Protocols used: COUGH - ACUTE PRODUCTIVE-A-AH

## 2018-04-13 ENCOUNTER — Ambulatory Visit: Payer: Self-pay | Admitting: Family Medicine

## 2018-04-13 NOTE — Telephone Encounter (Signed)
Pt informed we are not seeing patient in the office at this time and if she worsens to go to urgent care

## 2018-04-13 NOTE — Telephone Encounter (Signed)
Pt. Asking if she can be seen sooner to establish care. Has an appointment in May. Still has a "mostly dry cough." States her pharmacist recommended she try Claritin for allergy symptoms. No fever today. Instructed if she develops shortness of breath or chest pain to go to ED. Verbalizes understanding.

## 2018-04-17 ENCOUNTER — Telehealth: Payer: Self-pay | Admitting: Family Medicine

## 2018-04-17 ENCOUNTER — Other Ambulatory Visit: Payer: Self-pay | Admitting: Family Medicine

## 2018-04-17 ENCOUNTER — Encounter: Payer: Self-pay | Admitting: Family Medicine

## 2018-04-17 ENCOUNTER — Other Ambulatory Visit: Payer: Self-pay

## 2018-04-17 ENCOUNTER — Ambulatory Visit: Payer: Self-pay | Attending: Family Medicine | Admitting: Family Medicine

## 2018-04-17 DIAGNOSIS — R0789 Other chest pain: Secondary | ICD-10-CM

## 2018-04-17 DIAGNOSIS — J00 Acute nasopharyngitis [common cold]: Secondary | ICD-10-CM

## 2018-04-17 DIAGNOSIS — R4589 Other symptoms and signs involving emotional state: Secondary | ICD-10-CM

## 2018-04-17 DIAGNOSIS — N898 Other specified noninflammatory disorders of vagina: Secondary | ICD-10-CM

## 2018-04-17 DIAGNOSIS — F418 Other specified anxiety disorders: Secondary | ICD-10-CM

## 2018-04-17 DIAGNOSIS — I10 Essential (primary) hypertension: Secondary | ICD-10-CM

## 2018-04-17 MED ORDER — ATENOLOL 25 MG PO TABS: 12.5000 mg | ORAL_TABLET | Freq: Two times a day (BID) | ORAL | 0 refills | Status: DC

## 2018-04-17 MED ORDER — CETIRIZINE HCL 10 MG PO TABS: 10.0000 mg | ORAL_TABLET | Freq: Every day | ORAL | 3 refills | Status: DC

## 2018-04-17 NOTE — Telephone Encounter (Signed)
Patient called stating she believes she has bv please advise if patient needs to be seen.thank you

## 2018-04-17 NOTE — Telephone Encounter (Signed)
Order placed

## 2018-04-17 NOTE — Progress Notes (Signed)
Patient verified DOB Patient has taken medication today. Patient has not eaten. Patient denies any pain. Patient symptoms began 1 week ago. Mucous is clear and some tinges. Patient states mom had the same symptoms last week and was treated for allergies. Sore throat is no longer present. Patient has been taking atenolol for anxiety PRN.

## 2018-04-17 NOTE — Progress Notes (Signed)
Patient ID: Diane Lloyd, female   DOB: [birth year], 28 y.o.   MRN: [id number]  Patient left phone message and spoke with CMA about wanting testing for possible bacterial vaginitis.  Patient did not mention this during her telephone visit earlier today.  Order will be placed for patient to have urine ancillary testing for BV.

## 2018-04-17 NOTE — Telephone Encounter (Signed)
May patient complete a lab for urine and treatment via telephone?

## 2018-04-17 NOTE — Telephone Encounter (Signed)
She can come in for lab for ancillary but I spoke with her this am and she did not mention BV symptoms or concerns

## 2018-04-17 NOTE — Telephone Encounter (Signed)
Please schedule patient for urine drop off in lab

## 2018-04-17 NOTE — Telephone Encounter (Signed)
Patient did not complain of these symptoms during nurse assessment for pain of complaint. I will have front contact the patient for lab visit this week.

## 2018-04-17 NOTE — Progress Notes (Signed)
Virtual Visit via Telephone Note  I connected with Diane Lloyd on 04/17/18 at 8:55 am EST  by telephone and verified that I am speaking with the correct person using two identifiers.  Due to COVID-19 pandemic, there are limitations/restrictions on in- office visits therefore visit is being contacted by telephone   I discussed the limitations, risks, security and privacy concerns of performing an evaluation and management service by telephone and the availability of in person appointments. I also discussed with the patient that there may be a patient responsible charge related to this service. The patient expressed understanding and agreed to proceed.   History of Present Illness:      28 yo female with hypertension, atypical chest pain and anxiety disorder who initially stated that her call was regarding recent onset of nasal congestion, sore throat and sensation of mucus in the back of her throat which is been going on for little over a week.  Patient states that she took over-the-counter Claritin which did not seem to help.  Patient is also taking Mucinex.  Patient states that she no longer has a sore throat but continues to have nasal congestion which is slightly improved.  Patient also has a dull, frontal headache.  Patient denies any fever or chills.  Patient with an occasional cough which is mostly nonproductive.  Patient states that at the beginning of her symptoms, she did have some white to light yellow sputum with cough which has resolved.      Patient reports that she did not sleep with the anxiety medicine that was prescribed at her last visit.  Patient is taking the lisinopril hydrochlorothiazide for her blood pressure but is also taking atenolol on an as-needed basis that she states helps with her sensation of anxiety.  Patient continues to take atenolol that she obtains from someone else that she has never been prescribed this medication.  She is also status post recent emergency  department visit on 04/07/2018 for sensation of chest pain.  Patient continues to have chest pain that can be either on the right, left or mid chest.  Patient states that the pain is usually fleeting.  Pain does sometimes radiate to either arm.  Patient does not have any nausea, diaphoresis or radiation of pain into the neck, jaw or upper back.  Patient does feel that atenolol stops the chest pain.      Patient states that she is concerned because when she recently took atenolol and then check her blood pressure her blood pressure was 110/60.  Patient states that she checks her blood pressure daily with a home monitor as well as with an app on her phone.  Patient has had no dizziness or presyncopal sensation when her blood pressure is in the low 100s.  Past Medical History:  Diagnosis Date  . Anemia    iron def  . Asthma   . Hypertension   . Lupus (HCC)    + ANA in 2012  . Obesity, unspecified   . PCOS (polycystic ovarian syndrome)   . Positive ANA (antinuclear antibody) 05/05/2017   done by [facility] in [facility]; ANA 1:160, speckled pattern   Past Surgical History:  Procedure Laterality Date  . TONSILLECTOMY     Family History  Problem Relation Age of Onset  . Cancer Father 51       esophagus  . Asthma Father   . Hypertension Mother   . Lupus Mother   . Diabetes Mother   .  Cancer Maternal Uncle 40       pancreatic cancer  . Cancer Paternal Grandmother 59       esophageal   Social History   Tobacco Use  . Smoking status: Never Smoker  . Smokeless tobacco: Never Used  Substance Use Topics  . Alcohol use: Yes    Comment: occasional  . Drug use: Not Currently    Types: Marijuana    Comment: LAST TIME- 2 YEARS AGO   Allergies  Allergen Reactions  . Diflucan [Fluconazole] Itching and Rash    Review of Systems  Constitutional: Negative for chills and fever.  HENT: Positive for congestion and sore throat. Negative for ear pain.   Eyes: Negative for blurred vision  and double vision.  Respiratory: Positive for cough. Negative for shortness of breath and wheezing.   Cardiovascular: Positive for chest pain and palpitations. Negative for orthopnea, claudication, leg swelling and PND.  Gastrointestinal: Negative for abdominal pain, constipation, diarrhea, heartburn and nausea.  Genitourinary: Negative for dysuria and frequency.  Musculoskeletal: Negative for back pain and myalgias.  Skin: Negative for itching and rash.  Neurological: Positive for headaches. Negative for dizziness.  Endo/Heme/Allergies: Negative for polydipsia. Does not bruise/bleed easily.  Psychiatric/Behavioral: Negative for suicidal ideas. The patient is nervous/anxious.    Observations/Objective: No vital signs or physical examination done as visit was conducted via telephone  Assessment and Plan: 1. Essential hypertension Patient will continue her lisinopril-hydrochlorothiazide 10-12.5 mg a patient was also asked to take half of 25 mg atenolol twice daily not only for blood pressure but also to help with her sensation of anxiety.  Patient is asked to continue to monitor her blood pressure at home and patient may also wish to come into the office in 2 to 3 weeks for the nurse visit for recheck of her blood pressure.  Patient was asked to make an actual office visit in 3 to 4 weeks in follow-up of her hypertension, anxiety and atypical chest pain. - atenolol (TENORMIN) 25 MG tablet; Take 0.5 tablets (12.5 mg total) by mouth 2 (two) times daily.  Dispense: 90 tablet; Refill: 0  2. Atypical chest pain Prescription provided for atenolol 12.5 mg twice daily every day to help with her atypical chest pain as patient has had multiple negative emergency department work-ups for cardiac cause of her chest pain.  Patient does have upcoming cardiology follow-up which she should keep. - atenolol (TENORMIN) 25 MG tablet; Take 0.5 tablets (12.5 mg total) by mouth 2 (two) times daily.  Dispense: 90 tablet;  Refill: 0  3. Anxiety about health Patient with ongoing issues with anxiety regarding her health.  Patient has had multiple emergency department visits secondary to atypical chest pain with no cause found for her chest pain.  4.  Acute nasopharyngitis Discussed with the patient that she likely has a common cold versus increased symptoms secondary to allergic rhinitis.  Prescription has been sent to patient's pharmacy for cetirizine 10 mg which she should take in the evening as the medication can cause drowsiness.  This medication will help with postnasal drainage/nasal congestion.  If patient feels that the medication is too drying or sedating she may take half pill daily.   Future Appointments  Date Time Provider Department Center  04/28/2018  8:30 AM Cain Saupe, MD [facility] None  05/17/2018  1:00 PM [facility] ECHO 1 [facility]  05/17/2018  2:00 PM [facility]  05/17/2018  3:30 PM [facility] MONITOR [facility]  06/07/2018  9:00 AM Olive Bass, FNP [facility]    Follow Up Instructions:Return in about 3 weeks (around 05/08/2018) for HTN, anxiety.    I discussed the assessment and treatment plan with the patient. The patient was provided an opportunity to ask questions and all were answered. The patient agreed with the plan and demonstrated an understanding of the instructions.   The patient was advised to call back or seek an in-person evaluation if the symptoms worsen or if the condition fails to improve as anticipated.  I provided 12 minutes of non-face-to-face time during this encounter.   Cain Saupe, MD

## 2018-04-26 ENCOUNTER — Telehealth: Payer: Self-pay | Admitting: Family Medicine

## 2018-04-26 ENCOUNTER — Ambulatory Visit: Payer: Self-pay | Admitting: *Deleted

## 2018-04-26 NOTE — Telephone Encounter (Signed)
Patients call returned.  Patient identified by name and date of birth.  Patient called with a complaint of chest pressure.  Patient states that the pain is intermittent.  Patient describes pain as a discomfort.  Patient states pain is on the left side without radiation.  Patient denies shortness of breath or fever.  Patient endorses a productive cough.  Patient states chest pain has been present for two days.  Patient was prescribed zyrtec but hasn't taken it.  Patient also just got her acid reflux medicine today and has taken 1 pill today.  Patient concerned she has COVID  Patient advised that she show take her medications and see if they work and to call back tomorrow afternoon to be reevaluated.    Patient advised that if symptoms did not improve or worsened  Patient acknowledged understanding of advise.  then they should go to the Emergency Department or Urgent Care.

## 2018-04-26 NOTE — Telephone Encounter (Signed)
She needs to call her PCP- she is not a patient at out office.

## 2018-04-26 NOTE — Telephone Encounter (Signed)
Patient states she has headaches and chest pressure. Please follow up.

## 2018-04-26 NOTE — Telephone Encounter (Signed)
Yesterday pt had  chest pressure and burning in chest. No fever. She has a productive cough not a lot of phlegm. No sore throat. She has had headache and body aches. Headaches started last week and now off and on.  She has been not been around anyone that has been sick or tested positive for the virus. She does have a hx of reflux but does not have chest tightness when it happens. Flow at [facility] notified for  recommendation. Routing triage to office for review. Pt advised that if she starts having respiratory distress to call 911, pt voiced understanding.  Reason for Disposition . MILD difficulty breathing (e.g., minimal/no SOB at rest, SOB with walking, pulse <100)  Answer Assessment - Initial Assessment Questions 1. COVID-19 DIAGNOSIS: "Who made your Coronavirus (COVID-19) diagnosis?" "Was it confirmed by a positive lab test?" If not diagnosed by a HCP, ask "Are there lots of cases (community spread) where you live?" (See public health department website, if unsure)   * MAJOR community spread: high number of cases; numbers of cases are increasing; many people hospitalized.   * MINOR community spread: low number of cases; not increasing; few or no people hospitalized     Community spread 2. ONSET: "When did the COVID-19 symptoms start?"      Headache last week and chest pressure yesterday 3. WORST SYMPTOM: "What is your worst symptom?" (e.g., cough, fever, shortness of breath, muscle aches)     Chest pressure 4. COUGH: "How bad is the cough?"       Productive cough 5. FEVER: "Do you have a fever?" If so, ask: "What is your temperature, how was it measured, and when did it start?"     No fever 6. RESPIRATORY STATUS: "Describe your breathing?" (e.g., shortness of breath, wheezing, unable to speak)      Some shortness of breath  7. BETTER-SAME-WORSE: "Are you getting better, staying the same or getting worse compared to yesterday?"  If getting worse, ask, "In what way?"     worst 8.  HIGH RISK DISEASE: "Do you have any chronic medical problems?" (e.g., asthma, heart or lung disease, weak immune system, etc.)     High blood pressure 9. PREGNANCY: "Is there any chance you are pregnant?" "When was your last menstrual period?"     Not pregnant  LMP about 2 weeks ago 10. OTHER SYMPTOMS: "Do you have any other symptoms?"  (e.g., runny nose, headache, sore throat, loss of smell)       Headache off and on  Protocols used: CORONAVIRUS (COVID-19) DIAGNOSED OR SUSPECTED-A-AH

## 2018-04-26 NOTE — Telephone Encounter (Signed)
Spoke with patient and info given. She has been advised to contact Dr. Debroah Baller office for follow up regarding symptoms.

## 2018-04-27 ENCOUNTER — Encounter (HOSPITAL_COMMUNITY): Payer: Self-pay

## 2018-04-27 ENCOUNTER — Telehealth: Payer: Self-pay | Admitting: Family Medicine

## 2018-04-27 ENCOUNTER — Ambulatory Visit (HOSPITAL_COMMUNITY)
Admission: EM | Admit: 2018-04-27 | Discharge: 2018-04-27 | Disposition: A | Discharge status: Home / Self Care | Payer: Medicaid Other | Attending: Internal Medicine | Admitting: Internal Medicine

## 2018-04-27 ENCOUNTER — Other Ambulatory Visit: Payer: Self-pay

## 2018-04-27 DIAGNOSIS — Z3202 Encounter for pregnancy test, result negative: Secondary | ICD-10-CM

## 2018-04-27 DIAGNOSIS — K219 Gastro-esophageal reflux disease without esophagitis: Secondary | ICD-10-CM

## 2018-04-27 DIAGNOSIS — R112 Nausea with vomiting, unspecified: Secondary | ICD-10-CM

## 2018-04-27 DIAGNOSIS — R110 Nausea: Secondary | ICD-10-CM

## 2018-04-27 LAB — POCT URINALYSIS DIP (DEVICE)
Bilirubin Urine: NEGATIVE
Glucose, UA: NEGATIVE mg/dL
Ketones, ur: NEGATIVE mg/dL
Leukocytes,Ua: NEGATIVE
Nitrite: NEGATIVE
Protein, ur: NEGATIVE mg/dL
Specific Gravity, Urine: 1.025 (ref 1.005–1.030)
Urobilinogen, UA: 0.2 mg/dL (ref 0.0–1.0)
pH: 7 (ref 5.0–8.0)

## 2018-04-27 LAB — POCT PREGNANCY, URINE: Preg Test, Ur: NEGATIVE

## 2018-04-27 MED ORDER — LIDOCAINE VISCOUS HCL 2 % MT SOLN: 15.0000 mL | OROMUCOSAL | 0 refills | Status: DC | PRN

## 2018-04-27 MED ORDER — ALUM & MAG HYDROXIDE-SIMETH 200-200-20 MG/5ML PO SUSP
ORAL | Status: AC
  Filled 2018-04-27: qty 30

## 2018-04-27 MED ORDER — ALUM & MAG HYDROXIDE-SIMETH 200-200-20 MG/5ML PO SUSP
30.0000 mL | Freq: Once | ORAL | Status: AC
  Administered 2018-04-27: 30 mL via ORAL

## 2018-04-27 MED ORDER — ONDANSETRON HCL 4 MG PO TABS: 4.0000 mg | ORAL_TABLET | Freq: Four times a day (QID) | ORAL | 0 refills | Status: DC

## 2018-04-27 MED ORDER — LIDOCAINE VISCOUS HCL 2 % MT SOLN
OROMUCOSAL | Status: AC
  Filled 2018-04-27: qty 15

## 2018-04-27 MED ORDER — LIDOCAINE VISCOUS HCL 2 % MT SOLN
15.0000 mL | Freq: Once | OROMUCOSAL | Status: AC
  Administered 2018-04-27: 15 mL via ORAL

## 2018-04-27 NOTE — Discharge Instructions (Signed)
Urine did not show sign of infection Urine pregnancy negative  GI cocktail given in office Reports improvement Zofran prescribed.  Take as needed for nausea.   Viscous lidocaine prescribed.  This is an oral solution you can swish, gargle, and/or swallow as needed for symptomatic relief.  Do not exceed 8 doses in a 24 hour period.  Do not use prior to eating, as this will numb your entire mouth.    Continue with omeprazole as prescribed.   Avoid eating 2-3 hours before bed Elevate head of bed.  Avoid chocolate, caffeine, alcohol, onion, and mint prior to bed.  This relaxes the bottom part of your esophagus and can make your symptoms worse.  Follow up with PCP for recheck and to ensure symptoms are improving either tomorrow or next week Go to the ED if you have any new or worsening symptoms such as fever, chills, nausea, vomiting, chest pain, shortness of breath, diarrhea, bloody or dark tarry stools, constipation, urinary symptoms, worsening abdominal discomfort, symptoms that do not improve with medications, inability to keep fluids down, etc..Marland Kitchen

## 2018-04-27 NOTE — ED Provider Notes (Signed)
[facility]   [id number] 04/27/18 Arrival Time: 1451  CC: ABDOMINAL DISCOMFORT  SUBJECTIVE:  Diane Lloyd is a 28 y.o. female hx significant for asthma, anemia, HTN, lupus, obesity, and PCOS, who presents with complaint of nausea and burning/ fullness sensation in esophagus x 2 days.  Report eating "a lot of spicy foods" last week.  Also recently finished metronidazole for BV, denies alcohol use while taking this medication. Denies abdominal or chest discomfort.  Has tried acid omeprazole with temporary relief.  Denies alleviating or aggravating factors.  Denies association with eating, drinking, or using the restroom.  Reports similar symptoms in the past associated with acid reflux.  Last BM yesterday and normal for patient.  Admits to increased urinary frequency.    Denies fever, chills, appetite changes, weight changes, vomiting, chest pain, SOB, diarrhea, constipation, hematochezia, melena, dysuria, difficulty urinating, increased frequency or urgency, flank pain, loss of bowel or bladder function.   Patient's last menstrual period was 04/04/2018.  ROS: As per HPI.  Past Medical History:  Diagnosis Date  . Anemia    iron def  . Asthma   . Hypertension   . Lupus (HCC)    + ANA in 2012  . Obesity, unspecified   . PCOS (polycystic ovarian syndrome)   . Positive ANA (antinuclear antibody) 05/05/2017   done by [facility] in [facility]; ANA 1:160, speckled pattern   Past Surgical History:  Procedure Laterality Date  . TONSILLECTOMY     Allergies  Allergen Reactions  . Diflucan [Fluconazole] Itching and Rash   No current facility-administered medications on file prior to encounter.    Current Outpatient Medications on File Prior to Encounter  Medication Sig Dispense Refill  . acetaminophen (TYLENOL) 500 MG tablet Take 500 mg by mouth every 6 (six) hours as needed for headache.    Marland Kitchen atenolol (TENORMIN) 25 MG tablet Take 0.5 tablets (12.5 mg total) by mouth  2 (two) times daily. 90 tablet 0  . cetirizine (ZYRTEC) 10 MG tablet Take 1 tablet (10 mg total) by mouth at bedtime. As needed for nasal congestion 30 tablet 3  . lisinopril-hydrochlorothiazide (PRINZIDE,ZESTORETIC) 10-12.5 MG tablet Take 1 tablet by mouth daily. To lower blood pressure 90 tablet 1  . SPRINTEC 28 0.25-35 MG-MCG tablet Take 1 tablet by mouth daily.   1   Social History   Socioeconomic History  . Marital status: Single    Spouse name: Not on file  . Number of children: 0  . Years of education: Not on file  . Highest education level: Some college, no degree  Occupational History  . Occupation: CSR    Employer: [employer]  Social Needs  . Financial resource strain: Not on file  . Food insecurity:    Worry: Not on file    Inability: Not on file  . Transportation needs:    Medical: Not on file    Non-medical: Not on file  Tobacco Use  . Smoking status: Never Smoker  . Smokeless tobacco: Never Used  Substance and Sexual Activity  . Alcohol use: Yes    Comment: occasional  . Drug use: Not Currently    Types: Marijuana    Comment: LAST TIME- 2 YEARS AGO  . Sexual activity: Yes    Birth control/protection: Pill  Lifestyle  . Physical activity:    Days per week: Not on file    Minutes per session: Not on file  . Stress: Not on file  Relationships  . Social connections:  Talks on phone: Not on file    Gets together: Not on file    Attends religious service: Not on file    Active member of club or organization: Not on file    Attends meetings of clubs or organizations: Not on file    Relationship status: Not on file  . Intimate partner violence:    Fear of current or ex partner: Not on file    Emotionally abused: Not on file    Physically abused: Not on file    Forced sexual activity: Not on file  Other Topics Concern  . Not on file  Social History Narrative   Patient is right-handed. She lives with her mother and uncle in a one level home. She  occasionally drinks caffeine. She does not exercise.   Family History  Problem Relation Age of Onset  . Cancer Father [id number]       esophagus  . Asthma Father   . Hypertension Mother   . Lupus Mother   . Diabetes Mother   . Cancer Maternal Uncle 40       pancreatic cancer  . Cancer Paternal Grandmother [id number]       esophageal     OBJECTIVE:  Vitals:   04/27/18 1511 04/27/18 1513  BP:  126/82  Pulse:  92  Resp:  18  Temp:  98.4 F (36.9 C)  TempSrc:  Oral  SpO2:  97%  Weight: 225 lb (102.1 kg)     General appearance: Alert; NAD HEENT: NCAT.  Oropharynx clear.  Lungs: clear to auscultation bilaterally without adventitious breath sounds Heart: regular rate and rhythm.  Radial pulses 2+ symmetrical bilaterally Abdomen: soft, non-distended; normal active bowel sounds; non-tender to light and deep palpation; nontender at McBurney's point; negative Murphy's sign; no guarding Back: no CVA tenderness Extremities: no edema; symmetrical with no gross deformities Skin: warm and dry Neurologic: normal gait Psychological: alert and cooperative; normal mood and affect  LABS: Results for orders placed or performed during the hospital encounter of 04/27/18 (from the past 24 hour(s))  POCT urinalysis dip (device)     Status: Abnormal   Collection Time: 04/27/18  3:58 PM  Result Value Ref Range   Glucose, UA NEGATIVE NEGATIVE mg/dL   Bilirubin Urine NEGATIVE NEGATIVE   Ketones, ur NEGATIVE NEGATIVE mg/dL   Specific Gravity, Urine 1.025 1.005 - 1.030   Hgb urine dipstick TRACE (A) NEGATIVE   pH 7.0 5.0 - 8.0   Protein, ur NEGATIVE NEGATIVE mg/dL   Urobilinogen, UA 0.2 0.0 - 1.0 mg/dL   Nitrite NEGATIVE NEGATIVE   Leukocytes,Ua NEGATIVE NEGATIVE  Pregnancy, urine POC     Status: None   Collection Time: 04/27/18  4:03 PM  Result Value Ref Range   Preg Test, Ur NEGATIVE NEGATIVE    ASSESSMENT & PLAN:  1. Nausea without vomiting   2. Gastroesophageal reflux disease, esophagitis  presence not specified     Meds ordered this encounter  Medications  . AND Linked Order Group   . alum & mag hydroxide-simeth (MAALOX/MYLANTA) 200-200-20 MG/5ML suspension 30 mL   . lidocaine (XYLOCAINE) 2 % viscous mouth solution 15 mL  . ondansetron (ZOFRAN) 4 MG tablet    Sig: Take 1 tablet (4 mg total) by mouth every 6 (six) hours.    Dispense:  12 tablet    Refill:  0    Order Specific Question:   Supervising Provider    Answer:   Eustace MooreNELSON, YVONNE SUE [[id number]]  . lidocaine (XYLOCAINE)  2 % solution    Sig: Use as directed 15 mLs in the mouth or throat as needed for mouth pain.    Dispense:  100 mL    Refill:  0    Order Specific Question:   Supervising Provider    Answer:   Eustace Moore [[id number]]    Urine did not show sign of infection Urine pregnancy negative  GI cocktail given in office Reports improvement Zofran prescribed.  Take as needed for nausea.   Viscous lidocaine prescribed.  This is an oral solution you can swish, gargle, and/or swallow as needed for symptomatic relief.  Do not exceed 8 doses in a 24 hour period.  Do not use prior to eating, as this will numb your entire mouth.    Continue with omeprazole as prescribed.   Avoid eating 2-3 hours before bed Elevate head of bed.  Avoid chocolate, caffeine, alcohol, onion, and mint prior to bed.  This relaxes the bottom part of your esophagus and can make your symptoms worse.  Follow up with PCP for recheck and to ensure symptoms are improving either tomorrow or next week Go to the ED if you have any new or worsening symptoms such as fever, chills, nausea, vomiting, chest pain, shortness of breath, diarrhea, bloody or dark tarry stools, constipation, urinary symptoms, worsening abdominal discomfort, symptoms that do not improve with medications, inability to keep fluids down, etc...  Reviewed expectations re: course of current medical issues. Questions answered. Outlined signs and symptoms indicating need for  more acute intervention. Patient verbalized understanding. After Visit Summary given.   Rennis Harding, PA-C 04/27/18 1629

## 2018-04-27 NOTE — ED Triage Notes (Signed)
Pt cc abdominal pain. Pt was having a burning in her chest like acid reflux and she took her med for it and it got better. Pt states the chest discomfort was going since Tuesday of this week after taking the meds she began to feel better.  Pt has has nausea since last Sunday.  Pt has been using tylenol and mucinex as well.

## 2018-04-27 NOTE — Telephone Encounter (Signed)
Patient called stating she still has chest pressure. Please follow up

## 2018-04-27 NOTE — Telephone Encounter (Signed)
Patients call returned.  Patient identified by name and date of birth.  Patient called back as advised stating she was still having left sided chest pressure as she did yesterday.  Patient advised to go to Urgent Care for further evaluation.  Patient acknowledged understanding of advise.

## 2018-04-28 ENCOUNTER — Emergency Department (HOSPITAL_BASED_OUTPATIENT_CLINIC_OR_DEPARTMENT_OTHER)
Admission: EM | Admit: 2018-04-28 | Discharge: 2018-04-28 | Disposition: A | Discharge status: Home / Self Care | Payer: Self-pay | Attending: Emergency Medicine | Admitting: Emergency Medicine

## 2018-04-28 ENCOUNTER — Encounter (HOSPITAL_BASED_OUTPATIENT_CLINIC_OR_DEPARTMENT_OTHER): Payer: Self-pay

## 2018-04-28 ENCOUNTER — Encounter: Payer: Self-pay | Admitting: Family Medicine

## 2018-04-28 ENCOUNTER — Other Ambulatory Visit: Payer: Self-pay

## 2018-04-28 ENCOUNTER — Telehealth: Payer: Self-pay | Admitting: Family Medicine

## 2018-04-28 ENCOUNTER — Emergency Department (HOSPITAL_BASED_OUTPATIENT_CLINIC_OR_DEPARTMENT_OTHER): Payer: Self-pay

## 2018-04-28 ENCOUNTER — Ambulatory Visit: Payer: Self-pay | Attending: Family Medicine | Admitting: Family Medicine

## 2018-04-28 DIAGNOSIS — D649 Anemia, unspecified: Secondary | ICD-10-CM

## 2018-04-28 DIAGNOSIS — Z9114 Patient's other noncompliance with medication regimen: Secondary | ICD-10-CM

## 2018-04-28 DIAGNOSIS — R0789 Other chest pain: Secondary | ICD-10-CM

## 2018-04-28 DIAGNOSIS — D539 Nutritional anemia, unspecified: Secondary | ICD-10-CM | POA: Insufficient documentation

## 2018-04-28 DIAGNOSIS — Z8719 Personal history of other diseases of the digestive system: Secondary | ICD-10-CM | POA: Insufficient documentation

## 2018-04-28 DIAGNOSIS — R1010 Upper abdominal pain, unspecified: Secondary | ICD-10-CM | POA: Insufficient documentation

## 2018-04-28 DIAGNOSIS — Z79899 Other long term (current) drug therapy: Secondary | ICD-10-CM | POA: Insufficient documentation

## 2018-04-28 DIAGNOSIS — I10 Essential (primary) hypertension: Secondary | ICD-10-CM | POA: Insufficient documentation

## 2018-04-28 DIAGNOSIS — R4589 Other symptoms and signs involving emotional state: Secondary | ICD-10-CM

## 2018-04-28 DIAGNOSIS — J45909 Unspecified asthma, uncomplicated: Secondary | ICD-10-CM | POA: Insufficient documentation

## 2018-04-28 DIAGNOSIS — Z91148 Patient's other noncompliance with medication regimen for other reason: Secondary | ICD-10-CM

## 2018-04-28 DIAGNOSIS — F418 Other specified anxiety disorders: Secondary | ICD-10-CM

## 2018-04-28 DIAGNOSIS — R072 Precordial pain: Secondary | ICD-10-CM | POA: Insufficient documentation

## 2018-04-28 DIAGNOSIS — K219 Gastro-esophageal reflux disease without esophagitis: Secondary | ICD-10-CM

## 2018-04-28 LAB — COMPREHENSIVE METABOLIC PANEL
ALT: 13 U/L (ref 0–44)
AST: 13 U/L — ABNORMAL LOW (ref 15–41)
Albumin: 3.5 g/dL (ref 3.5–5.0)
Alkaline Phosphatase: 38 U/L (ref 38–126)
Anion gap: 6 (ref 5–15)
BUN: 11 mg/dL (ref 6–20)
CO2: 23 mmol/L (ref 22–32)
Calcium: 8.8 mg/dL — ABNORMAL LOW (ref 8.9–10.3)
Chloride: 105 mmol/L (ref 98–111)
Creatinine, Ser: 0.75 mg/dL (ref 0.44–1.00)
GFR calc Af Amer: >60 mL/min (ref >60)
GFR calc non Af Amer: >60 mL/min (ref >60)
Glucose, Bld: 97 mg/dL (ref 70–99)
Potassium: 3.5 mmol/L (ref 3.5–5.1)
Sodium: 134 mmol/L — ABNORMAL LOW (ref 135–145)
Total Bilirubin: 0.2 mg/dL — ABNORMAL LOW (ref 0.3–1.2)
Total Protein: 7.4 g/dL (ref 6.5–8.1)

## 2018-04-28 LAB — CBC WITH DIFFERENTIAL/PLATELET
Abs Immature Granulocytes: 0.02 10*3/uL (ref 0.00–0.07)
Basophils Absolute: 0 10*3/uL (ref 0.0–0.1)
Basophils Relative: 0 %
Eosinophils Absolute: 0 10*3/uL (ref 0.0–0.5)
Eosinophils Relative: 0 %
HCT: 33.2 % — ABNORMAL LOW (ref 36.0–46.0)
Hemoglobin: 10.3 g/dL — ABNORMAL LOW (ref 12.0–15.0)
Immature Granulocytes: 0 %
Lymphocytes Relative: 25 %
Lymphs Abs: 1.8 10*3/uL (ref 0.7–4.0)
MCH: 24.8 pg — ABNORMAL LOW (ref 26.0–34.0)
MCHC: 31 g/dL (ref 30.0–36.0)
MCV: 79.8 fL — ABNORMAL LOW (ref 80.0–100.0)
Monocytes Absolute: 0.5 10*3/uL (ref 0.1–1.0)
Monocytes Relative: 7 %
Neutro Abs: 4.7 10*3/uL (ref 1.7–7.7)
Neutrophils Relative %: 68 %
Platelets: 361 10*3/uL (ref 150–400)
RBC: 4.16 MIL/uL (ref 3.87–5.11)
RDW: 16.3 % — ABNORMAL HIGH (ref 11.5–15.5)
WBC: 7.1 10*3/uL (ref 4.0–10.5)
nRBC: 0 % (ref 0.0–0.2)

## 2018-04-28 LAB — TROPONIN I: Troponin I: <0.03 ng/mL (ref <0.03)

## 2018-04-28 LAB — LIPASE, BLOOD: Lipase: 39 U/L (ref 11–51)

## 2018-04-28 MED ORDER — FERROUS SULFATE 325 (65 FE) MG PO TABS: 325.0000 mg | ORAL_TABLET | Freq: Every day | ORAL | 0 refills | Status: DC

## 2018-04-28 NOTE — Progress Notes (Signed)
Virtual Visit via Telephone Note  I connected with Diane Lloyd on 04/28/18 at  8:30 AM EDT by telephone and verified that I am speaking with the correct person using two identifiers.  Due to in-office visit limitations/restrictions due to COVID-19 pandemic, visit is being performed via tele-health encounter,   I discussed the limitations, risks, security and privacy concerns of performing an evaluation and management service by telephone and the availability of in person appointments. I also discussed with the patient that there may be a patient responsible charge related to this service. The patient expressed understanding and agreed to proceed.  Patient location: Car-patient was asked to pull over to a safe location such as a parking lot to continue visit though she did express that she was using hands free communication Provider location: Office Patient contacted by phone by Delphia Gratesctavia Roberts, RMA  History of Present Illness:      28 yo female with history of anxiety, essential hypertension and recurrent atypical chest pain who is status post urgent care yesterday due to the complaint of nausea, burping and the sensation of fullness in her esophagus x 2 days. Per urgent care notes she was given GI cocktail with relief of symptoms and told to continue use of omeprazole and avoid known trigger foods-list provided to patient. Patient also prescribed Zofran for nausea and viscous lidocaine.        Patient reports that "they did not do an EKG like the nurse suggested that they do as the provider did not feel that it was needed."  Patient reports that last night she had onset of substernal chest pressure/chest pain and patient states that she was so afraid that she was having a heart attack that she started having a panic attack and was crying.  She reports that she checked her blood pressure which was elevated at 160 and patient then took an atenolol and upon recheck her blood pressure was decreasing  and this morning her blood pressure was in the 130s over 80.  Patient does have an upcoming cardiology appointment and she wonders if she will have cardiac monitoring done at that time.  Patient states she had not been taking both lisinopril-hydrochlorothiazide along with atenolol 12.5 mg twice daily which was discussed with the patient recently.  I discussed with the patient that by taking the atenolol 12.5 mg that may help keep her from having increased anxiety sensation.  Patient states that her blood pressure with both the lisinopril hydrochlorothiazide along with the atenolol causes her blood pressure to be around 110/60 but she denies any dizziness/lightheadedness associated with his blood pressure.       She reports that she does feel better this morning.  No current chest pain, no current abdominal pain and no nausea.  Patient states that she did not start the use of omeprazole until this Wednesday as she states that she had never picked it up previously from the pharmacy.  She denies any headaches or dizziness related to her blood pressure.  Past Medical History:  Diagnosis Date  . Anemia    iron def  . Asthma   . Hypertension   . Lupus (HCC)    + ANA in 2012  . Obesity, unspecified   . PCOS (polycystic ovarian syndrome)   . Positive ANA (antinuclear antibody) 05/05/2017   [facility]; ANA 1:160, speckled pattern   Past Surgical History:  Procedure Laterality Date  . TONSILLECTOMY     Family History  Problem Relation Age of Onset  . Cancer Father 88       esophagus  . Asthma Father   . Hypertension Mother   . Lupus Mother   . Diabetes Mother   . Cancer Maternal Uncle 40       pancreatic cancer  . Cancer Paternal Grandmother 71       esophageal   Social History   Tobacco Use  . Smoking status: Never Smoker  . Smokeless tobacco: Never Used  Substance Use Topics  . Alcohol use: Yes    Comment: occasional  . Drug use: Not Currently    Types:  Marijuana    Comment: LAST TIME- 2 YEARS AGO   Allergies  Allergen Reactions  . Diflucan [Fluconazole] Itching and Rash    Current Outpatient Medications:  .  acetaminophen (TYLENOL) 500 MG tablet, Take 500 mg by mouth every 6 (six) hours as needed for headache., Disp: , Rfl:  .  atenolol (TENORMIN) 25 MG tablet, Take 0.5 tablets (12.5 mg total) by mouth 2 (two) times daily., Disp: 90 tablet, Rfl: 0 .  cetirizine (ZYRTEC) 10 MG tablet, Take 1 tablet (10 mg total) by mouth at bedtime. As needed for nasal congestion, Disp: 30 tablet, Rfl: 3 .  lidocaine (XYLOCAINE) 2 % solution, Use as directed 15 mLs in the mouth or throat as needed for mouth pain., Disp: 100 mL, Rfl: 0 .  lisinopril-hydrochlorothiazide (PRINZIDE,ZESTORETIC) 10-12.5 MG tablet, Take 1 tablet by mouth daily. To lower blood pressure, Disp: 90 tablet, Rfl: 1 .  omeprazole (PRILOSEC) 40 MG capsule, Take 40 mg by mouth daily., Disp: , Rfl:  .  ondansetron (ZOFRAN) 4 MG tablet, Take 1 tablet (4 mg total) by mouth every 6 (six) hours., Disp: 12 tablet, Rfl: 0 .  SPRINTEC 28 0.25-35 MG-MCG tablet, Take 1 tablet by mouth daily. , Disp: , Rfl: 1  Review of Systems  Constitutional: Positive for malaise/fatigue. Negative for chills and fever.  HENT: Negative for congestion and sore throat.   Eyes: Negative for blurred vision, double vision and photophobia.  Respiratory: Negative for cough and shortness of breath.   Cardiovascular: Positive for chest pain and palpitations. Negative for leg swelling.  Gastrointestinal: Positive for heartburn and nausea. Negative for abdominal pain, blood in stool, constipation, diarrhea, melena and vomiting.  Genitourinary: Negative for dysuria and frequency.  Musculoskeletal: Negative for joint pain and myalgias.  Neurological: Negative for dizziness and headaches.  Endo/Heme/Allergies: Negative for polydipsia. Does not bruise/bleed easily.  Psychiatric/Behavioral: Negative for suicidal ideas. The  patient is nervous/anxious. The patient does not have insomnia.      Observations/Objective: No vital signs obtained at today's visit and no physical exam performed as visit was conducted via telephone  Assessment and Plan: 1. Atypical chest pain Patient was encouraged to keep her upcoming appointment with cardiology regarding her atypical chest pain and sensation of palpitations.  Again reiterated to patient that she should take the atenolol 12.5 mg twice daily along with her current lisinopril hydrochlorothiazide as I believe that the atenolol will help with her sensation of anxiety.  Discussed with patient that blood pressure of 100/60 is still considered to be within normal as long as patient is asymptomatic.  Patient had obtained atenolol from someone else which she then started taking on an as-needed basis when she felt that she was having anxiety and patient was encouraged to try the use of this medication on a daily basis when I last spoke with her however patient continues  to take medication as needed.  Patient had also in the past been prescribed PPI therapy for atypical chest pain which was thought to also be related to acid reflux but patient never started medication.  Patient's atypical chest pain does likely stem from a combination of GERD, as well as anxiety.  Patient was asked to come to the office for in person visit within the next 2 to 3 weeks but call or return sooner if she continues to have chest pain.  Go to the emergency department or urgent care for worsening/recurrent chest pain or any concerns.  2. Essential hypertension Patient is encouraged to continue lisinopril-hydrochlorothiazide as well as atenolol 12.5 mg twice daily as patient had been taking atenolol 25 mg which she obtained from a friend and was taking on an as-needed basis for her anxiety.  She was asked to make follow-up visit in the next 2 to 3 weeks or in person blood pressure check and blood work.  Discussed  possibility of discontinuing the hydrochlorothiazide portion of lisinopril/hydrochlorothiazide (patient began taking this medication on her own but bury medication from her mother) and having patient remain on lisinopril along with beta-blocker such as Toprol-XL or atenolol once daily as patient seems to also be concerned about her blood pressure being low when she takes both the lisinopril hydrochlorothiazide and atenolol daily.  3. GERD Patient instructed to take omeprazole 40 mg daily and also notify that it may take up to 2 weeks for her to have complete resolution of reflux symptoms.  Patient should also avoid spicy/greasy foods as well as avoidance of late night eating  4.  Anxiety about health Patient has been previously prescribed medication to help with anxiety which patient did not start.  Patient has declined referral for further mental health evaluation/treatment of anxiety but she did meet with medical social worker for counseling at a prior visit. She was asked to return for an in-person visit  5.  Noncompliance with medication treatment due to intermittent use of medication.  Noncompliance with medication regimen Discussed with patient the importance of medication compliance as well as the importance of not taking medications prescribed for others without consulting her primary care physician or other medical specialist    Future Appointments  Date Time Provider Department Center  05/12/2018  8:30 AM Cain Saupe, MD [facility] None  05/17/2018  1:00 PM [facility] E[facility] 2 [id number]  05/17/2018  2:00 PM [facility] TREADMILL [facility]  05/17/2018  3:30 PM [facility] MONITOR [facility]  06/07/2018  9:00 AM Olive Bass, FNP [facility]      Follow Up Instructions: return for in-person office visit in 2-3 weeks; go to ED/urgent care if recurrent issues with chest pain or any concerns    I discussed the assessment and treatment plan with the  patient. The patient was provided an opportunity to ask questions and all were answered. The patient agreed with the plan and demonstrated an understanding of the instructions.   The patient was advised to call back or seek an in-person evaluation if the symptoms worsen or if the condition fails to improve as anticipated.  I provided 13 minutes of non-face-to-face time during this encounter.   Cain Saupe, MD

## 2018-04-28 NOTE — Telephone Encounter (Signed)
New Message   Pt states she went to the ER and they told her to follow up with a GI doctor and the pt is wanting to confirm it with Dr. Jillyn Hidden. Please f/u

## 2018-04-28 NOTE — Progress Notes (Signed)
Per pt she was seen at the Urgent care yesterday 04-27-18.  Per pt the Urgent care stated it may be related to reflux  Per pt she is having abd pain, headache and chest pain.   Chest pressure started Tuesday,  Nausea started Sunday  Headaches started Wednesday

## 2018-04-28 NOTE — ED Notes (Signed)
Assisted to bathroom to provide urine specimen 

## 2018-04-28 NOTE — Discharge Instructions (Addendum)
Your symptoms are likely from gastritis, indigestion, or an ulcer. You should continue taking omeprazole as directed, and avoid spicy/fatty/acidic foods, avoid soda/coffee/tea/alcohol. Avoid laying down flat within 30 minutes of eating. Avoid NSAIDs like ibuprofen/aleve/motrin/etc on an empty stomach. May consider using over the counter tums/maalox as needed for additional relief. Use home zofran as directed as needed for nausea. Use tylenol as needed for pain. Start taking iron as directed. If this causes constipation then use over the counter colace or miralax to help with that. Follow up with your regular doctor in 5-7 days for recheck of symptoms. Return to the ER for changes or worsening symptoms.

## 2018-04-28 NOTE — ED Triage Notes (Signed)
Pt reports central chest and upper abdominal pain since Tuesday.  Had phone consultation with pmd and sent to  urgent care to get EKG.  EKG not performed yesterday at urgent care, pt states was told not indicated due to symptoms.  Pt continues to complain of pain. Occurs when lying down and sometimes during exertion.

## 2018-04-28 NOTE — ED Provider Notes (Signed)
[facility] EMERGENCY DEPARTMENT Provider Note   CSN: [id number] Arrival date & time: 04/28/18  1415    History   Chief Complaint Chief Complaint  Patient presents with   Chest Pain   Abdominal Pain    HPI    Diane Lloyd is a 28 y.o. female with a PMHx of GERD, anemia, asthma, HTN, lupus, PCOS, and other conditions listed below, who presents to the ED with complaints of upper abd pain and chest pain that began Tuesday (3 days ago) then resolved before returning yesterday and has been intermittent since then (although currently resolved).  Patient states that when her symptoms first started she thought it could be her indigestion, so she started taking her omeprazole and her symptoms resolved.  However yesterday her symptoms returned, she called her PCP and they advised her to be seen at [facility].  She went to [facility] yesterday, Upreg was neg, U/A did not reveal any evidence of UTI, she was given GI cocktail and improved, therefore discharged with rx's for zofran and viscous lidocaine.  She has not filled her rx's yet, however she continued to have symptoms intermittently so she decided to come here for further evaluation.  She describes her pain as 7/10 intermittent pressure across the anterior chest with a stabbing central chest and epigastric abdominal pain, which radiates into her epigastrium, no other radiation elsewhere, with no known aggravating factors, unchanged with inspiration or exertion, and minimally improved with Tylenol and omeprazole.  Reports associated intermittent nausea.  She again reports that currently her symptoms are resolved.  She is a non-smoker with no known family history of cardiac disease.  She is on OCPs.  She denies any fevers, chills, cough, lightheadedness, diaphoresis, shortness of breath, leg swelling, recent travel/surgery/immobilization, personal history of DVT/PE, vomiting, diarrhea, constipation, dysuria, hematuria, vaginal bleeding or discharge,  numbness, tingling, focal weakness, claudication, orthopnea, or any other complaints at this time.  The history is provided by the patient and medical records. No language interpreter was used.  Chest Pain  Associated symptoms: abdominal pain and nausea   Associated symptoms: no cough, no diaphoresis, no fever, no numbness, no shortness of breath, no vomiting and no weakness   Abdominal Pain  Associated symptoms: chest pain and nausea   Associated symptoms: no chills, no constipation, no cough, no diarrhea, no dysuria, no fever, no hematuria, no shortness of breath, no vaginal bleeding, no vaginal discharge and no vomiting     Past Medical History:  Diagnosis Date   Anemia    iron def   Asthma    Hypertension    Lupus (HCC)    + ANA in 2012   Obesity, unspecified    PCOS (polycystic ovarian syndrome)    Positive ANA (antinuclear antibody) 05/05/2017   done by [facility] in [facility]; ANA 1:160, speckled pattern    Patient Active Problem List   Diagnosis Date Noted   Vaginal candidiasis 12/18/2015   Diarrhea 11/10/2015   Gastroenteritis 09/24/2015   Screen for STD (sexually transmitted disease) 09/21/2015   Rash and nonspecific skin eruption 08/31/2015   Dysmenorrhea 08/31/2015   Allergic rhinitis 05/17/2013   GERD (gastroesophageal reflux disease) 03/20/2012   Polycystic ovarian syndrome 12/26/2011   Chest pain 11/01/2011   Family history of cardiac disorder 02/17/2011   Contraception management 12/30/2010   Depression, major, single episode, 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/23/2010   Essential hypertension 01/27/2010   ANEMIA, IRON DEFICIENCY 06/27/2009   Obesity 03/10/2006   ASTHMA, INTERMITTENT 03/10/2006  ECZEMA, ATOPIC DERMATITIS 03/10/2006    Past Surgical History:  Procedure Laterality Date   TONSILLECTOMY       OB History    Gravida  0   Para      Term      Preterm      AB      Living        SAB      TAB      Ectopic       Multiple      Live Births               Home Medications    Prior to Admission medications   Medication Sig Start Date End Date Taking? Authorizing Provider  acetaminophen (TYLENOL) 500 MG tablet Take 500 mg by mouth every 6 (six) hours as needed for headache.    [provider]  atenolol (TENORMIN) 25 MG tablet Take 0.5 tablets (12.5 mg total) by mouth 2 (two) times daily. 04/17/18   Fulp, Cammie, MD  cetirizine (ZYRTEC) 10 MG tablet Take 1 tablet (10 mg total) by mouth at bedtime. As needed for nasal congestion 04/17/18   Fulp, Cammie, MD  lidocaine (XYLOCAINE) 2 % solution Use as directed 15 mLs in the mouth or throat as needed for mouth pain. 04/27/18   Wurst, [place], PA-C  lisinopril-hydrochlorothiazide (PRINZIDE,ZESTORETIC) 10-12.5 MG tablet Take 1 tablet by mouth daily. To lower blood pressure 02/06/18   Fulp, Cammie, MD  omeprazole (PRILOSEC) 40 MG capsule Take 40 mg by mouth daily.    [provider]  ondansetron (ZOFRAN) 4 MG tablet Take 1 tablet (4 mg total) by mouth every 6 (six) hours. 04/27/18   Rennis Harding, PA-C  SPRINTEC 28 0.25-35 MG-MCG tablet Take 1 tablet by mouth daily.  08/26/17   [provider]    Family History Family History  Problem Relation Age of Onset   Cancer Father 69       esophagus   Asthma Father    Hypertension Mother    Lupus Mother    Diabetes Mother    Cancer Maternal Uncle 93       pancreatic cancer   Cancer Paternal Grandmother 62       esophageal    Social History Social History   Tobacco Use   Smoking status: Never Smoker   Smokeless tobacco: Never Used  Substance Use Topics   Alcohol use: Yes    Comment: occasional   Drug use: Not Currently    Types: Marijuana    Comment: LAST TIME- 2 YEARS AGO     Allergies   Diflucan [fluconazole]   Review of Systems Review of Systems  Constitutional: Negative for chills, diaphoresis and fever.  Respiratory: Negative for cough and shortness  of breath.   Cardiovascular: Positive for chest pain. Negative for leg swelling.  Gastrointestinal: Positive for abdominal pain and nausea. Negative for constipation, diarrhea and vomiting.  Genitourinary: Negative for dysuria, hematuria, vaginal bleeding and vaginal discharge.  Musculoskeletal: Negative for arthralgias and myalgias.  Skin: Negative for color change.  Allergic/Immunologic: Negative for immunocompromised state.  Neurological: Negative for weakness, light-headedness and numbness.  Psychiatric/Behavioral: Negative for confusion.   All other systems reviewed and are negative for acute change except as noted in the HPI.    Physical Exam Updated Vital Signs BP 123/71 (BP Location: Right Arm)    Pulse 78    Temp 98.4 F (36.9 C) (Oral)    Resp 18    Ht 5'  11" (1.803 m)    Wt 103.4 kg    LMP 04/04/2018    SpO2 100%    BMI 31.80 kg/m   Physical Exam Vitals signs and nursing note reviewed.  Constitutional:      General: She is not in acute distress.    Appearance: Normal appearance. She is well-developed. She is not toxic-appearing.     Comments: Afebrile, nontoxic, NAD  HENT:     Head: Normocephalic and atraumatic.  Eyes:     General:        Right eye: No discharge.        Left eye: No discharge.     Conjunctiva/sclera: Conjunctivae normal.  Neck:     Musculoskeletal: Normal range of motion and neck supple.  Cardiovascular:     Rate and Rhythm: Normal rate and regular rhythm.     Pulses: Normal pulses.     Heart sounds: Normal heart sounds, S1 normal and S2 normal. No murmur. No friction rub. No gallop.      Comments: RRR, nl s1/s2, no m/r/g, distal pulses intact, no pedal edema  Pulmonary:     Effort: Pulmonary effort is normal. No respiratory distress.     Breath sounds: Normal breath sounds. No decreased breath sounds, wheezing, rhonchi or rales.     Comments: CTAB in all lung fields, no w/r/r, no hypoxia or increased WOB, speaking in full sentences, SpO2 100% on  RA  Chest:     Chest wall: Tenderness present. No deformity or crepitus.       Comments: Chest wall with mild lower sternal TTP near the epigastric region, without crepitus, deformities, or retractions  Abdominal:     General: Bowel sounds are normal. There is no distension.     Palpations: Abdomen is soft. Abdomen is not rigid.     Tenderness: There is abdominal tenderness in the epigastric area. There is no right CVA tenderness, left CVA tenderness, guarding or rebound. Negative signs include Murphy's sign and McBurney's sign.     Comments: Soft, nondistended, +BS throughout, with mild epigastric TTP, no r/g/r, neg murphy's, neg mcburney's, no CVA TTP   Musculoskeletal: Normal range of motion.  Skin:    General: Skin is warm and dry.     Findings: No rash.  Neurological:     Mental Status: She is alert and oriented to person, place, and time.     Sensory: Sensation is intact. No sensory deficit.     Motor: Motor function is intact.  Psychiatric:        Mood and Affect: Mood and affect normal.        Behavior: Behavior normal.      ED Treatments / Results  Labs (all labs ordered are listed, but only abnormal results are displayed) Labs Reviewed  CBC WITH DIFFERENTIAL/PLATELET - Abnormal; Notable for the following components:      Result Value   Hemoglobin 10.3 (*)    HCT 33.2 (*)    MCV 79.8 (*)    MCH 24.8 (*)    RDW 16.3 (*)    All other components within normal limits  COMPREHENSIVE METABOLIC PANEL - Abnormal; Notable for the following components:   Sodium 134 (*)    Calcium 8.8 (*)    AST 13 (*)    Total Bilirubin 0.2 (*)    All other components within normal limits  LIPASE, BLOOD  TROPONIN I    EKG EKG Interpretation  Date/Time:  Friday April 28 2018 14:33:04 EDT Ventricular  Rate:  72 PR Interval:    QRS Duration: 96 QT Interval:  395 QTC Calculation: 433 R Axis:   61 Text Interpretation:  Sinus rhythm No STEMI.  Confirmed by Alona BeneLong, Joshua [phone]([id number]) on  04/28/2018 2:36:41 PM   Radiology Dg Chest 2 View  Result Date: 04/28/2018 CLINICAL DATA:  Chest pressure EXAM: CHEST - 2 VIEW COMPARISON:  March 01, 2018 FINDINGS: No edema or consolidation. Heart size and pulmonary vascularity are normal. No adenopathy. No pneumothorax. No bone lesions. IMPRESSION: No edema or consolidation. Electronically Signed   By: Bretta BangWilliam  Woodruff III M.D.   On: 04/28/2018 15:21    Procedures Procedures (including critical care time)  Medications Ordered in ED Medications - No data to display   Initial Impression / Assessment and Plan / ED Course  I have reviewed the triage vital signs and the nursing notes.  Pertinent labs & imaging results that were available during my care of the patient were reviewed by me and considered in my medical decision making (see chart for details).        28 y.o. female here with diffuse chest pain and upper abd pain x3 days, got better then returned. [facility] and upreg neg, given GI cocktail which improved it so was discharged with zofran and viscous lidocaine rx's (hasn't filled them yet). Continues to be intermittent, although currently improved. On exam, no tachycardia or hypoxia, no pedal edema, PERC neg, very mild epigastric and lower sternal TTP, nonperitoneal, clear lung exam. DDx includes gastritis vs GERD, etc, however I feel it's reasonable. Doubt PE or dissection. EKG nonischemic and unremarkable. Pt declines wanting anything for her symptoms currently since her symptoms are resolved. Will get basic labs and CXR, and reassess shortly.   4:24 PM CBC w/diff with mild stable anemia. CMP fairly unremarkable. Lipase WNL. Trop neg. CXR negative. Pt continues to feel well, no ongoing chest pain at this time. Suspect symptoms are related to GERD/gastritis, doubt ACS or other emergent pathology requiring further emergent work up at this time. Will start on iron supplementation for her anemia. Advised  continuation of omeprazole, use of meds that were rx'd yesterday, use of other OTC remedies for symptomatic relief, discussed diet/lifestyle modifications, and f/up with PCP in 1wk for recheck. I explained the diagnosis and have given explicit precautions to return to the ER including for any other new or worsening symptoms. The patient understands and accepts the medical plan as it's been dictated and I have answered their questions. Discharge instructions concerning home care and prescriptions have been given. The patient is STABLE and is discharged to home in good condition.    Final Clinical Impressions(s) / ED Diagnoses   Final diagnoses:  Precordial chest pain  Upper abdominal pain  Chronic anemia  Hx of gastroesophageal reflux (GERD)    ED Discharge Orders         Ordered    ferrous sulfate 325 (65 FE) MG tablet  Daily with breakfast     04/28/18 [address]1623           Martel Galvan, GuaynaboMercedes, New JerseyPA-C 04/28/18 1624    Charlynne PanderYao, David Hsienta, MD 04/28/18 2112

## 2018-05-01 ENCOUNTER — Other Ambulatory Visit: Payer: Self-pay | Admitting: Family Medicine

## 2018-05-01 DIAGNOSIS — R0789 Other chest pain: Secondary | ICD-10-CM

## 2018-05-01 NOTE — Telephone Encounter (Signed)
Pt called back stated her doctor advised for her to follow up with a GI she wanted to check if she was going to get referred out or if she would get a call please follow up

## 2018-05-01 NOTE — Telephone Encounter (Signed)
Please let patient know that a GI referral was placed

## 2018-05-01 NOTE — Progress Notes (Signed)
Patient ID: Diane Lloyd, female   DOB: [birth year], 28 y.o.   MRN: [id number]   Patient with recurrent atypical chest pain that is suggestive of GI source. Will place GI referral for further evaluation

## 2018-05-01 NOTE — Telephone Encounter (Signed)
Called and informed patient with what provider stated. Patient verbalized understanding.

## 2018-05-03 ENCOUNTER — Ambulatory Visit (INDEPENDENT_AMBULATORY_CARE_PROVIDER_SITE_OTHER): Payer: Self-pay | Admitting: Gastroenterology

## 2018-05-03 ENCOUNTER — Other Ambulatory Visit: Payer: Self-pay

## 2018-05-03 VITALS — BP 115/72 | HR 62 | Ht 71.0 in | Wt 224.0 lb

## 2018-05-03 DIAGNOSIS — K219 Gastro-esophageal reflux disease without esophagitis: Secondary | ICD-10-CM

## 2018-05-03 DIAGNOSIS — R634 Abnormal weight loss: Secondary | ICD-10-CM

## 2018-05-03 DIAGNOSIS — R1013 Epigastric pain: Secondary | ICD-10-CM

## 2018-05-03 NOTE — Addendum Note (Signed)
Addended by: Rachael Fee on: 05/03/2018 08:53 AM   Modules accepted: Level of Service

## 2018-05-03 NOTE — Progress Notes (Addendum)
She was scheduled for a "telemedicine visit this morning".  I spent about 10 minutes prepping her chart and my CMA Kelly spent about an equal amount of time yesterday prepping her chart.  I contacted her via text through Zoom app.  She did not reply.  I called her on her phone and she did not answer.  I left a message on her voicemail for her to call back if she is interested in rescheduling this appointment.  She called back to my office this morning, she was able to be reached through her mother's cell phone only and so we are able to complete this visit.  She was at home, I was in my office.  She agreed and consented to the telemedicine visit.  Grace Bushy, my certified medical assistant helped prepping her chart and following up with orders afterwards.   HPI: This is a pleasant 28 year old woman whom I met via this telephone visit today for the first time.  She has been to the emergency room several times over the past 6 months with atypical chest pains.  Last week she started having some heartburn to her chest and her throat.  She was started on omeprazole but she only takes it incorrectly about 2 to 3 hours before she eats a meal in the morning.  She has also had sharp epigastric pains that occur once to twice a day, the last 5 minutes.  They can radiate to her right upper quadrant.  She has lost 30 pounds since November she attributes a lot of this to stress and anxiety.  She does not take NSAIDs.  She drinks 2 caffeinated sodas 16 ounces each every day and occasionally a sweet tea.  She does not smoke cigarettes.  She does not drink alcohol  Labs April 2020 show hemoglobin 10.3, MCV 79.8, lipase normal, complete metabolic profile normal except for slightly low potassium and calcium  Right upper quadrant ultrasound June 2016 for right upper quadrant pain shows normal gallbladder and normal liver.  Chest x-ray April 2020 was normal  Multiple ER visits in 2020 most for atypical chest pain,  1 for nausea.  Evaluation by cardiology March 2020 suggested atypical chest pain.  Her EKG was normal.  She was scheduled for a cardiac event monitor, exercise tolerance stress test, and a 2D echo however she has not had any of those tests, I think partly because of the coronavirus pandemic  Chief complaint is   ROS: complete GI ROS as described in HPI, all other review negative.  Constitutional:  No unintentional weight loss   Past Medical History:  Diagnosis Date  . Anemia    iron def  . Asthma   . Hypertension   . Lupus ([place])    + ANA in 2012  . Obesity, unspecified   . PCOS (polycystic ovarian syndrome)   . Positive ANA (antinuclear antibody) 05/05/2017   done by [facility] in [facility]; ANA 1:160, speckled pattern    Past Surgical History:  Procedure Laterality Date  . TONSILLECTOMY      Current Outpatient Medications  Medication Sig Dispense Refill  . acetaminophen (TYLENOL) 500 MG tablet Take 500 mg by mouth every 6 (six) hours as needed for headache.    Marland Kitchen atenolol (TENORMIN) 25 MG tablet Take 0.5 tablets (12.5 mg total) by mouth 2 (two) times daily. 90 tablet 0  . ferrous sulfate 325 (65 FE) MG tablet Take 1 tablet (325 mg total) by mouth daily with breakfast. TAKE WITH  ORANGE JUICE 30 tablet 0  . lisinopril-hydrochlorothiazide (PRINZIDE,ZESTORETIC) 10-12.5 MG tablet Take 1 tablet by mouth daily. To lower blood pressure 90 tablet 1  . omeprazole (PRILOSEC) 40 MG capsule Take 40 mg by mouth daily.    . SPRINTEC 28 0.25-35 MG-MCG tablet Take 1 tablet by mouth daily.   1   No current facility-administered medications for this visit.     Allergies as of 05/03/2018 - Review Complete 05/02/2018  Allergen Reaction Noted  . Diflucan [fluconazole] Itching and Rash 02/05/2016    Family History  Problem Relation Age of Onset  . Cancer Father 12       esophagus  . Asthma Father   . Hypertension Mother   . Lupus Mother   . Diabetes Mother   . Cancer Maternal  Uncle 40       pancreatic cancer  . Cancer Paternal Grandmother 64       esophageal    Social History   Socioeconomic History  . Marital status: Single    Spouse name: Not on file  . Number of children: 0  . Years of education: Not on file  . Highest education level: Some college, no degree  Occupational History  . Occupation: CSR    Employer: [employer]  Social Needs  . Financial resource strain: Not on file  . Food insecurity:    Worry: Not on file    Inability: Not on file  . Transportation needs:    Medical: Not on file    Non-medical: Not on file  Tobacco Use  . Smoking status: Never Smoker  . Smokeless tobacco: Never Used  Substance and Sexual Activity  . Alcohol use: Yes    Comment: occasional  . Drug use: Not Currently    Types: Marijuana    Comment: LAST TIME- 2 YEARS AGO  . Sexual activity: Yes    Birth control/protection: Pill  Lifestyle  . Physical activity:    Days per week: Not on file    Minutes per session: Not on file  . Stress: Not on file  Relationships  . Social connections:    Talks on phone: Not on file    Gets together: Not on file    Attends religious service: Not on file    Active member of club or organization: Not on file    Attends meetings of clubs or organizations: Not on file    Relationship status: Not on file  . Intimate partner violence:    Fear of current or ex partner: Not on file    Emotionally abused: Not on file    Physically abused: Not on file    Forced sexual activity: Not on file  Other Topics Concern  . Not on file  Social History Narrative   Patient is right-handed. She lives with her mother and uncle in a one level home. She occasionally drinks caffeine. She does not exercise.     Physical Exam: Unable to perform because this was a "telemed visit" due to current Covid-19 pandemic  Assessment and plan: 28 y.o. female with GERD, epigastric pain, weight loss  First she is not taking her proton pump inhibitor  at the correct time in relation to meals and so I educated her about that.  She will start taking it 20 to 30 minutes before her first reliable meal of the day.  Acid might be playing some role in her symptoms.  Her intermittent brief epigastric pains it radiate to the right upper quadrant may  be biliary in origin.  She had an ultrasound 4 years ago which showed normal gallbladder without stones but I will going to repeat that for her now.  She might have symptomatic cholelithiasis going on here.  Lastly I have some concerns about her weight loss.  She blames anxiety however quite a bit of weight loss that she is reporting in the past for 5 months, 30 pounds.  I see this is a red flag and I recommend upper endoscopy at her soonest convenience to exclude neoplasm.  I think that is unlikely but prudent to proceed with testing.  I think a delay here might lead to significant patient morbidity.  Please see the "Patient Instructions" section for addition details about the plan.  Owens Loffler, MD [facility] 05/03/2018, 7:52 AM

## 2018-05-03 NOTE — Patient Instructions (Addendum)
She knows to change the way she is taking her omeprazole so that she takes it 20 to 30 minutes prior to her first reliable meal of the day  We will arrange a right upper quadrant ultrasound for her intermittent epigastric pain that radiates to the right upper quadrant.  This might be biliary colic.  We will arrange an upper endoscopy next week for her epigastric pain, GERD, weight loss of 30 pounds over the past several months.  You have been scheduled for an abdominal ultrasound at [facility] [address] [place] on 05/10/18 at 9am. Please arrive 15 minutes prior to your appointment for registration. Make certain not to have anything to eat or drink 6 hours prior to your appointment. Should you need to reschedule your appointment, please contact radiology at [phone]. This test typically takes about 30 minutes to perform.

## 2018-05-03 NOTE — Addendum Note (Signed)
Addended by: Barbaraann Rondo A on: 05/03/2018 09:22 AM   Modules accepted: Orders

## 2018-05-07 ENCOUNTER — Encounter: Payer: Self-pay | Admitting: Family Medicine

## 2018-05-08 ENCOUNTER — Telehealth: Payer: Self-pay | Admitting: *Deleted

## 2018-05-08 NOTE — Telephone Encounter (Signed)
LMOM to confirm appointment and do to COVID screening questions

## 2018-05-08 NOTE — Telephone Encounter (Signed)
Patient is returning your call.  

## 2018-05-08 NOTE — Telephone Encounter (Signed)
Covid-19 travel screening questions  Have you traveled in the last 14 days? no If yes where?  Do you now or have you had a fever in the last 14 days? no  Do you have any respiratory symptoms of shortness of breath or cough now or in the last 14 days? no  Do you have any family members or close contacts with diagnosed or suspected Covid-19? No  Pt is aware that care partner will be waiting in the car during her procedure

## 2018-05-09 ENCOUNTER — Other Ambulatory Visit: Payer: Self-pay

## 2018-05-09 ENCOUNTER — Encounter: Payer: Self-pay | Admitting: Gastroenterology

## 2018-05-09 ENCOUNTER — Ambulatory Visit (AMBULATORY_SURGERY_CENTER): Payer: Self-pay | Admitting: Gastroenterology

## 2018-05-09 VITALS — BP 117/67 | HR 75 | Temp 99.3°F | Resp 13 | Ht 71.0 in | Wt 224.0 lb

## 2018-05-09 DIAGNOSIS — R634 Abnormal weight loss: Secondary | ICD-10-CM

## 2018-05-09 DIAGNOSIS — K219 Gastro-esophageal reflux disease without esophagitis: Secondary | ICD-10-CM

## 2018-05-09 MED ORDER — SODIUM CHLORIDE 0.9 % IV SOLN: 500.0000 mL | Freq: Once | INTRAVENOUS | Status: DC

## 2018-05-09 NOTE — Op Note (Signed)
[facility] Patient Name: Diane Lloyd Procedure Date: 05/09/2018 9:56 AM MRN: [id number] Endoscopist: Rachael Fee , MD Age: 28 Referring MD:  Date of Birth: March 06, 1990 Gender: Female Account #: [id number] Procedure:                Upper GI endoscopy Indications:              Heartburn, chest pain, weight loss Medicines:                Monitored Anesthesia Care Procedure:                Pre-Anesthesia Assessment:                           - Prior to the procedure, a History and Physical                            was performed, and patient medications and                            allergies were reviewed. The patient's tolerance of                            previous anesthesia was also reviewed. The risks                            and benefits of the procedure and the sedation                            options and risks were discussed with the patient.                            All questions were answered, and informed consent                            was obtained. Prior Anticoagulants: The patient has                            taken no previous anticoagulant or antiplatelet                            agents. ASA Grade Assessment: II - A patient with                            mild systemic disease. After reviewing the risks                            and benefits, the patient was deemed in                            satisfactory condition to undergo the procedure.                           After obtaining informed consent, the endoscope was  passed under direct vision. Throughout the                            procedure, the patient's blood pressure, pulse, and                            oxygen saturations were monitored continuously. The                            Endoscope was introduced through the mouth, and                            advanced to the second part of duodenum. The upper                            GI endoscopy was  accomplished without difficulty.                            The patient tolerated the procedure well. Scope In: Scope Out: Findings:                 The esophagus was normal.                           The stomach was normal.                           The examined duodenum was normal. Complications:            No immediate complications. Estimated blood loss:                            None. Estimated Blood Loss:     Estimated blood loss: none. Impression:               - Normal esophagus.                           - Normal stomach.                           - Normal examined duodenum. Recommendation:           - Patient has a contact number available for                            emergencies. The signs and symptoms of potential                            delayed complications were discussed with the                            patient. Return to normal activities tomorrow.                            Written discharge instructions were provided to the  patient.                           - Resume previous diet.                           - Continue present medications (the omeprazole is                            best taken 20-30 min before a meal).                           - Await the results of your upcoming ultrasound (to                            check for gallstones) Rachael Fee, MD 05/09/2018 10:08:28 AM This report has been signed electronically.

## 2018-05-09 NOTE — Progress Notes (Signed)
To PACU, VSS. Report to [email or web address].tb 

## 2018-05-09 NOTE — Progress Notes (Signed)
Notified Adriana Reams CRNA that patient had 2-3oz of water at 7:15, per [place] ok to proceed after 9:15

## 2018-05-09 NOTE — Patient Instructions (Signed)
Take Omeprazole 20-30 minutes before meals.    Await results of upcoming ultrasound.    YOU HAD AN ENDOSCOPIC PROCEDURE TODAY AT THE [facility]:   Refer to the procedure report that was given to you for any specific questions about what was found during the examination.  If the procedure report does not answer your questions, please call your gastroenterologist to clarify.  If you requested that your care partner not be given the details of your procedure findings, then the procedure report has been included in a sealed envelope for you to review at your convenience later.  YOU SHOULD EXPECT: Some feelings of bloating in the abdomen. Passage of more gas than usual.  Walking can help get rid of the air that was put into your GI tract during the procedure and reduce the bloating. If you had a lower endoscopy (such as a colonoscopy or flexible sigmoidoscopy) you may notice spotting of blood in your stool or on the toilet paper. If you underwent a bowel prep for your procedure, you may not have a normal bowel movement for a few days.  Please Note:  You might notice some irritation and congestion in your nose or some drainage.  This is from the oxygen used during your procedure.  There is no need for concern and it should clear up in a day or so.  SYMPTOMS TO REPORT IMMEDIATELY:    Following upper endoscopy (EGD)  Vomiting of blood or coffee ground material  New chest pain or pain under the shoulder blades  Painful or persistently difficult swallowing  New shortness of breath  Fever of 100F or higher  Black, tarry-looking stools  For urgent or emergent issues, a gastroenterologist can be reached at any hour by calling [phone].   DIET:  We do recommend a small meal at first, but then you may proceed to your regular diet.  Drink plenty of fluids but you should avoid alcoholic beverages for 24 hours.  ACTIVITY:  You should plan to take it easy for the rest of today and you  should NOT DRIVE or use heavy machinery until tomorrow (because of the sedation medicines used during the test).    FOLLOW UP: Our staff will call the number listed on your records the next business day following your procedure to check on you and address any questions or concerns that you may have regarding the information given to you following your procedure. If we do not reach you, we will leave a message.  However, if you are feeling well and you are not experiencing any problems, there is no need to return our call.  We will assume that you have returned to your regular daily activities without incident.  If any biopsies were taken you will be contacted by phone or by letter within the next 1-3 weeks.  Please call us at [phone] if you have not heard about the biopsies in 3 weeks.    SIGNATURES/CONFIDENTIALITY: You and/or your care partner have signed paperwork which will be entered into your electronic medical record.  These signatures attest to the fact that that the information above on your After Visit Summary has been reviewed and is understood.  Full responsibility of the confidentiality of this discharge information lies with you and/or your care-partner.

## 2018-05-10 ENCOUNTER — Telehealth: Payer: Self-pay | Admitting: *Deleted

## 2018-05-10 ENCOUNTER — Ambulatory Visit
Admission: RE | Admit: 2018-05-10 | Discharge: 2018-05-10 | Disposition: A | Discharge status: Home / Self Care | Payer: Self-pay | Source: Ambulatory Visit | Attending: Gastroenterology | Admitting: Gastroenterology

## 2018-05-10 DIAGNOSIS — R634 Abnormal weight loss: Secondary | ICD-10-CM

## 2018-05-10 DIAGNOSIS — K219 Gastro-esophageal reflux disease without esophagitis: Secondary | ICD-10-CM

## 2018-05-10 DIAGNOSIS — R1013 Epigastric pain: Secondary | ICD-10-CM

## 2018-05-10 NOTE — Telephone Encounter (Signed)
  Follow up Call-  Call back number 05/09/2018  Post procedure Call Back phone  # [phone]  Permission to leave phone message Yes  Some recent data might be hidden    [facility]

## 2018-05-11 ENCOUNTER — Other Ambulatory Visit: Payer: Self-pay

## 2018-05-11 ENCOUNTER — Ambulatory Visit (INDEPENDENT_AMBULATORY_CARE_PROVIDER_SITE_OTHER): Payer: Self-pay | Admitting: Licensed Clinical Social Worker

## 2018-05-11 DIAGNOSIS — F411 Generalized anxiety disorder: Secondary | ICD-10-CM
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
## 2018-05-12 ENCOUNTER — Ambulatory Visit: Payer: Medicaid Other

## 2018-05-12 ENCOUNTER — Ambulatory Visit: Payer: Medicaid Other | Admitting: Family Medicine

## 2018-05-17 ENCOUNTER — Ambulatory Visit (HOSPITAL_COMMUNITY): Payer: Self-pay | Attending: Cardiology

## 2018-05-17 ENCOUNTER — Telehealth: Payer: Self-pay | Admitting: Radiology

## 2018-05-17 ENCOUNTER — Other Ambulatory Visit: Payer: Self-pay

## 2018-05-17 DIAGNOSIS — R079 Chest pain, unspecified: Secondary | ICD-10-CM

## 2018-05-17 NOTE — Telephone Encounter (Signed)
Enrolled patient for a 30 day [employer] event monitor to be mailed due to covid-19. Brief instructions were gone over the the patient and she knows to expect the monitor to arrive in 3-4 days.

## 2018-05-18 ENCOUNTER — Telehealth: Payer: Self-pay | Admitting: Cardiology

## 2018-05-18 NOTE — Telephone Encounter (Signed)
Next few weeks would be ok

## 2018-05-18 NOTE — Telephone Encounter (Signed)
The patient wanted to know when she could have her stress test. She says she has chest pain constant throughout the day. She says the pain is on the left side, top of her breast. She says the pain improves if she pushes on it or lays down. She also has occasional dizziness.

## 2018-05-18 NOTE — Telephone Encounter (Signed)
  Patient had a message from Morgan and was told to call if she had questions. She would like to speak to him because she has a few questions. Please call after 3pm, she will be at work until then.

## 2018-05-18 NOTE — Telephone Encounter (Signed)
New Message ° ° ° ° °Patient returning your call please call her back. °

## 2018-05-19 NOTE — Telephone Encounter (Signed)
Spoke with the patient, she accepted.

## 2018-05-22 ENCOUNTER — Other Ambulatory Visit: Payer: Self-pay

## 2018-05-22 ENCOUNTER — Ambulatory Visit (HOSPITAL_COMMUNITY): Payer: Medicaid Other | Admitting: Licensed Clinical Social Worker

## 2018-05-22 ENCOUNTER — Telehealth: Payer: Self-pay

## 2018-05-22 DIAGNOSIS — F411 Generalized anxiety disorder: Secondary | ICD-10-CM

## 2018-05-22 NOTE — Telephone Encounter (Signed)
1. Have you developed a fever since your procedure? No.  2.   Have you had an respiratory symptoms (SOB or cough) since your procedure? No.  3.   Have you tested positive for COVID 19 since your procedure No.  3.   Have you had any family members/close contacts diagnosed with the COVID 19 since your procedure?  No   If any of these questions are a yes, please inquire if patient has been seen by family doctor and route this note to Tracy Walton, RN. 

## 2018-05-22 NOTE — Progress Notes (Signed)
Diane Lloyd is a 28 y.o. female patient anxiety. Client reports decrease in anxiety, no ER visits related to physical response to anxiety. Client is starting a new job this week and 'broke up' with an unhealthy relationship 2 days ago. Client reports anxiety is manageable at this time and she does not feel she is in need of therapy services at this time. Client would like ot be considered for medication management. Clinician provided client options for engaging in therapy in the future if needed. Clinician assessed for SI/HI/psychosis and overall level of functioning. Client denies SI/HI/psychosis, reports okay sleep and appetite.    Harlon Ditty, LCSW   Diagnosis: Anxiety state, Depression, single episode mild Goal: Manage physical symptoms of anxiety. Time 12:03pm-12:20pm  Virtual Visit via Telephone Note  I connected with Diane Lloyd on 05/22/18 at 12:00 PM EDT by telephone and verified that I am speaking with the correct person using two identifiers.   I discussed the limitations, risks, security and privacy concerns of performing an evaluation and management service by telephone and the availability of in person appointments. I also discussed with the patient that there may be a patient responsible charge related to this service. The patient expressed understanding and agreed to proceed.   I discussed the assessment and treatment plan with the patient. The patient was provided an opportunity to ask questions and all were answered. The patient agreed with the plan and demonstrated an understanding of the instructions.   The patient was advised to call back or seek an in-person evaluation if the symptoms worsen or if the condition fails to improve as anticipated.  I provided 15 minutes of non-face-to-face time during this encounter.   Harlon Ditty, LCSW

## 2018-05-23 ENCOUNTER — Ambulatory Visit (INDEPENDENT_AMBULATORY_CARE_PROVIDER_SITE_OTHER): Payer: Self-pay

## 2018-05-23 DIAGNOSIS — R002 Palpitations: Secondary | ICD-10-CM

## 2018-05-29 ENCOUNTER — Telehealth: Payer: Self-pay | Admitting: Family

## 2018-05-29 ENCOUNTER — Ambulatory Visit: Payer: Self-pay | Admitting: *Deleted

## 2018-05-29 NOTE — Telephone Encounter (Signed)
Please advise 

## 2018-05-29 NOTE — Telephone Encounter (Signed)
Dizziness with woozy feeling and sometimes with vertigo. This has been occurring for about one month and is intermittent. Also, has tingling all over and feels pressure in her head plus headaches. These are also intermittent symptoms. She will have episodes of blurred vision with some of the dizziness. These symptoms last only minutes and resolve on its own.B/p 129/63 pulse is 69 bpm now. Denies N/V/diarrhea/fever. 4//6 started atenolol 25 MG tablet she was prescribed to take I/2 tab twice daily but takes only I/2 tab daily due to it lowered her pressure too much. Drinking only 2 bottles of daily and has a dry mouth. Advised increasing water intake daily and lay down with legs up to improve blood flow.No recent travels and no known positive contacts.Reviewed urgent symptoms requiring immediate evaluation. Stated she understood. Aware she will not receive call from office until the morning.  Reason for Disposition . Taking a medicine that could cause dizziness (e.g., blood pressure medications, diuretics)  Answer Assessment - Initial Assessment Questions 1. DRIPTION: "Describe your dizziness."    Dizziness on/off for a month. 2. LIGHTHEADED: "Do you feel lightheaded?" (e.g., somewhat faint, woozy, weak upon standing)     Woozy and room spinning not related to her actions 3. VERTIGO: "Do you feel like either you or the room is spinning or tilting?" (i.e. vertigo)     Yes at times but less often than the woozy feeling 4. SEVERITY: "How bad is it?"  "Do you feel like you are going to faint?" "Can you stand and walk?"   - MILD - walking normally   - MODERATE - interferes with normal activities (e.g., work, school)    - SEVERE - unable to stand, requires support to walk, feels like passing out now.      Moderate to severe 5. ONSET:  "When did the dizziness begin?"     About one month ago 6. AGGRAVATING FACTORS: "Does anything make it worse?" (e.g., standing, change in head position)     Nothing in  particular 7. HEART RATE: "Can you tell me your heart rate?" "How many beats in 15 seconds?"  (Note: not all patients can do this)       129/73 p.69 bpm 8. SE: "What do you think is causing the dizziness?"   unsure 9. RECURRENT SYMPTOM: "Have you had dizziness before?" If so, ask: "When was the last time?" "What happened that time?"    Yes, in December MRI did not show anything. 10. OTHER SYMPTOMS: "Do you have any other symptoms?" (e.g., fever, chest pain, vomiting, diarrhea, bleeding)       Headaches daily, pressure all over her head that comes and goes 11. PREGNANCY: "Is there any chance you are pregnant?" "When was your last menstrual period?"       no  Protocols used: DIZZINESS [facility]

## 2018-05-29 NOTE — Telephone Encounter (Signed)
Regarding her upcoming appointment on May 27; We have reviewed the number of no-shows and cancellations she has at our office over the past 3 months. She has cancelled 3 new patient appointments. According to our protocol, we are not going to be able to see her at this office. She is already established with a PCP and needs to continue care with that provider or see another provider at a different [place] office.

## 2018-05-30 NOTE — Telephone Encounter (Signed)
cont'd from previous note at 3:55 PM:  voice message was left for pt. that if she is not currently under care of Cammie Fulp, MD, and wants to see a [place] physician, she will need to call to request an appt. at a different [place] office, as the [place] office will not continue to schedule appts. for her. Advised she can call [phone] to discuss further.

## 2018-05-30 NOTE — Telephone Encounter (Signed)
Attempted to contact pt. as a follow-up from nurse triage on 5/18.   Unable to reach pt. At [phone]; left voice message re: appt. With Ria Clock has been cancelled due to her not showing up or cancelling several previous appts. make aware that she needs to either contact current PCP that is listed in chart; (Dr. Cain Saupe)

## 2018-05-30 NOTE — Telephone Encounter (Signed)
I have called patient x2 this morning----will continue trying to reach her

## 2018-05-31 ENCOUNTER — Emergency Department (HOSPITAL_BASED_OUTPATIENT_CLINIC_OR_DEPARTMENT_OTHER)
Admission: EM | Admit: 2018-05-31 | Discharge: 2018-05-31 | Disposition: A | Discharge status: Home / Self Care | Payer: Self-pay | Attending: Emergency Medicine | Admitting: Emergency Medicine

## 2018-05-31 ENCOUNTER — Encounter (HOSPITAL_BASED_OUTPATIENT_CLINIC_OR_DEPARTMENT_OTHER): Payer: Self-pay

## 2018-05-31 ENCOUNTER — Emergency Department (HOSPITAL_BASED_OUTPATIENT_CLINIC_OR_DEPARTMENT_OTHER)
Admission: EM | Admit: 2018-05-31 | Discharge: 2018-05-31 | Disposition: A | Discharge status: Home / Self Care | Payer: Medicaid Other | Attending: Emergency Medicine | Admitting: Emergency Medicine

## 2018-05-31 ENCOUNTER — Encounter (HOSPITAL_BASED_OUTPATIENT_CLINIC_OR_DEPARTMENT_OTHER): Payer: Self-pay | Admitting: Emergency Medicine

## 2018-05-31 ENCOUNTER — Telehealth: Payer: Self-pay | Admitting: Family Medicine

## 2018-05-31 ENCOUNTER — Other Ambulatory Visit: Payer: Self-pay

## 2018-05-31 DIAGNOSIS — Z79899 Other long term (current) drug therapy: Secondary | ICD-10-CM | POA: Insufficient documentation

## 2018-05-31 DIAGNOSIS — R519 Headache, unspecified: Secondary | ICD-10-CM

## 2018-05-31 DIAGNOSIS — R51 Headache: Secondary | ICD-10-CM | POA: Insufficient documentation

## 2018-05-31 DIAGNOSIS — J45909 Unspecified asthma, uncomplicated: Secondary | ICD-10-CM | POA: Insufficient documentation

## 2018-05-31 DIAGNOSIS — I10 Essential (primary) hypertension: Secondary | ICD-10-CM | POA: Insufficient documentation

## 2018-05-31 LAB — CBC WITH DIFFERENTIAL/PLATELET
Abs Immature Granulocytes: 0.01 10*3/uL (ref 0.00–0.07)
Basophils Absolute: 0 10*3/uL (ref 0.0–0.1)
Basophils Relative: 0 %
Eosinophils Absolute: 0 10*3/uL (ref 0.0–0.5)
Eosinophils Relative: 0 %
HCT: 35.1 % — ABNORMAL LOW (ref 36.0–46.0)
Hemoglobin: 10.7 g/dL — ABNORMAL LOW (ref 12.0–15.0)
Immature Granulocytes: 0 %
Lymphocytes Relative: 36 %
Lymphs Abs: 1.7 10*3/uL (ref 0.7–4.0)
MCH: 24.7 pg — ABNORMAL LOW (ref 26.0–34.0)
MCHC: 30.5 g/dL (ref 30.0–36.0)
MCV: 80.9 fL (ref 80.0–100.0)
Monocytes Absolute: 0.5 10*3/uL (ref 0.1–1.0)
Monocytes Relative: 11 %
Neutro Abs: 2.6 10*3/uL (ref 1.7–7.7)
Neutrophils Relative %: 53 %
Platelets: 335 10*3/uL (ref 150–400)
RBC: 4.34 MIL/uL (ref 3.87–5.11)
RDW: 16.2 % — ABNORMAL HIGH (ref 11.5–15.5)
WBC: 4.9 10*3/uL (ref 4.0–10.5)
nRBC: 0 % (ref 0.0–0.2)

## 2018-05-31 LAB — COMPREHENSIVE METABOLIC PANEL
ALT: 20 U/L (ref 0–44)
AST: 20 U/L (ref 15–41)
Albumin: 3.9 g/dL (ref 3.5–5.0)
Alkaline Phosphatase: 41 U/L (ref 38–126)
Anion gap: 9 (ref 5–15)
BUN: 11 mg/dL (ref 6–20)
CO2: 24 mmol/L (ref 22–32)
Calcium: 9.2 mg/dL (ref 8.9–10.3)
Chloride: 103 mmol/L (ref 98–111)
Creatinine, Ser: 0.79 mg/dL (ref 0.44–1.00)
GFR calc Af Amer: >60 mL/min (ref >60)
GFR calc non Af Amer: >60 mL/min (ref >60)
Glucose, Bld: 87 mg/dL (ref 70–99)
Potassium: 4.1 mmol/L (ref 3.5–5.1)
Sodium: 136 mmol/L (ref 135–145)
Total Bilirubin: 0.7 mg/dL (ref 0.3–1.2)
Total Protein: 7.7 g/dL (ref 6.5–8.1)

## 2018-05-31 LAB — PREGNANCY, URINE: Preg Test, Ur: NEGATIVE

## 2018-05-31 MED ORDER — KETOROLAC TROMETHAMINE 60 MG/2ML IM SOLN
60.0000 mg | Freq: Once | INTRAMUSCULAR | Status: AC
  Administered 2018-05-31: 60 mg via INTRAMUSCULAR
  Filled 2018-05-31: qty 2

## 2018-05-31 MED ORDER — PROMETHAZINE HCL 25 MG PO TABS: 25.0000 mg | ORAL_TABLET | Freq: Three times a day (TID) | ORAL | 0 refills | Status: DC | PRN

## 2018-05-31 MED ORDER — SODIUM CHLORIDE 0.9 % IV BOLUS
1000.0000 mL | Freq: Once | INTRAVENOUS | Status: AC
  Administered 2018-05-31: 15:00:00 1000 mL via INTRAVENOUS

## 2018-05-31 MED ORDER — METOCLOPRAMIDE HCL 5 MG/ML IJ SOLN
10.0000 mg | Freq: Once | INTRAMUSCULAR | Status: AC
  Administered 2018-05-31: 10 mg via INTRAVENOUS
  Filled 2018-05-31: qty 2

## 2018-05-31 MED FILL — PROMETHAZINE 25 MG TABLET: 25 | 3 days supply | Qty: 8 | Fill #0

## 2018-05-31 NOTE — ED Notes (Signed)
Pt reports some improvement in headache.  Continues to report burning sensation.

## 2018-05-31 NOTE — Telephone Encounter (Signed)
Please attempt to contact patient and have her go back to the ED if she is not feeling well and have her schedule ED follow-up appointment here in office

## 2018-05-31 NOTE — Telephone Encounter (Signed)
Patient called stating she was recently discharged from the ED and states she is having head pain and head pressure and would like to get a referral appt/ for a MRI please follow up.

## 2018-05-31 NOTE — ED Provider Notes (Addendum)
[facility] EMERGENCY DEPARTMENT Provider Note   CSN: [id number] Arrival date & time: 05/31/18  1305    History   Chief Complaint Chief Complaint  Patient presents with  . Headache    HPI Diane Lloyd is a 28 y.o. female.     The history is provided by the patient and medical records. No language interpreter was used.  Headache   Diane Lloyd is a 28 y.o. female who presents to the Emergency Department complaining of HA. Presents to the emergency department complaining of headache that began two days ago. Discussed described as a pins and needles sensation across the top of her head. She denies any fevers, cough, shortness of breath, vomiting, numbness, weakness. She was seen in the emergency department earlier today and was feeling partially improved only to have worsening symptoms. She called her PCP, who told her to come into the emergency department. She also complains of a month and a half of intermittent dizziness as well as intermittent body aches. Past Medical History:  Diagnosis Date  . Anemia    iron def  . Anxiety   . Asthma   . Hypertension   . Obesity, unspecified   . PCOS (polycystic ovarian syndrome)   . Positive ANA (antinuclear antibody) 05/05/2017   [facility]; ANA 1:160, speckled pattern    Patient Active Problem List   Diagnosis Date Noted  . Vaginal candidiasis 12/18/2015  . Diarrhea 11/10/2015  . Gastroenteritis 09/24/2015  . Screen for STD (sexually transmitted disease) 09/21/2015  . Rash and nonspecific skin eruption 08/31/2015  . Dysmenorrhea 08/31/2015  . Allergic rhinitis 05/17/2013  . GERD (gastroesophageal reflux disease) 03/20/2012  . Polycystic ovarian syndrome 12/26/2011  . Chest pain 11/01/2011  . Family history of cardiac disorder 02/17/2011  . Contraception management 12/30/2010  . 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 Essential hypertension 01/27/2010  . ANEMIA,  IRON DEFICIENCY 06/27/2009  . Obesity 03/10/2006  . ASTHMA, INTERMITTENT 03/10/2006  . ECZEMA, ATOPIC DERMATITIS 03/10/2006    Past Surgical History:  Procedure Laterality Date  . TONSILLECTOMY       OB History    Gravida  0   Para      Term      Preterm      AB      Living        SAB      TAB      Ectopic      Multiple      Live Births               Home Medications    Prior to Admission medications   Medication Sig Start Date End Date Taking? Authorizing Provider  acetaminophen (TYLENOL) 500 MG tablet Take 500 mg by mouth every 6 (six) hours as needed for headache.    [provider]  atenolol (TENORMIN) 25 MG tablet Take 0.5 tablets (12.5 mg total) by mouth 2 (two) times daily. 04/17/18   Fulp, Cammie, MD  ferrous sulfate 325 (65 FE) MG tablet Take 1 tablet (325 mg total) by mouth daily with breakfast. TAKE WITH ORANGE JUICE 04/28/18   Street, BrooklynMercedes, PA-C  lisinopril-hydrochlorothiazide (PRINZIDE,ZESTORETIC) 10-12.5 MG tablet Take 1 tablet by mouth daily. To lower blood pressure 02/06/18   Fulp, Cammie, MD  omeprazole (PRILOSEC) 40 MG capsule Take 40 mg by mouth daily.    [provider]  promethazine (PHENERGAN) 25 MG tablet Take 1 tablet (25  mg total) by mouth every 8 (eight) hours as needed for nausea or vomiting. 05/31/18   Tilden Fossa, MD  SPRINTEC 28 0.25-35 MG-MCG tablet Take 1 tablet by mouth daily.  08/26/17   [provider]    Family History Family History  Problem Relation Age of Onset  . Cancer Father 57       esophagus  . Asthma Father   . Esophageal cancer Father   . Hypertension Mother   . Lupus Mother   . Diabetes Mother   . Cancer Maternal Uncle 40       pancreatic cancer  . Cancer Paternal Grandmother 15       esophageal  . Colon cancer Neg Hx   . Ulcerative colitis Neg Hx     Social History Social History   Tobacco Use  . Smoking status: Never Smoker  . Smokeless tobacco: Never Used   Substance Use Topics  . Alcohol use: Yes    Comment: occasional  . Drug use: Not Currently    Types: Marijuana     Allergies   Diflucan [fluconazole]   Review of Systems Review of Systems  Neurological: Positive for headaches.  All other systems reviewed and are negative.    Physical Exam Updated Vital Signs BP 126/69 (BP Location: Left Arm)   Pulse 77   Temp 98.8 F (37.1 C) (Oral)   Ht 5\' 11"  (1.803 m)   Wt 99.8 kg   LMP 05/19/2018   SpO2 100%   BMI 30.68 kg/m   Physical Exam Vitals signs and nursing note reviewed.  Constitutional:      Appearance: She is well-developed.  HENT:     Head: Normocephalic and atraumatic.  Eyes:     Extraocular Movements: Extraocular movements intact.     Pupils: Pupils are equal, round, and reactive to light.  Neck:     Musculoskeletal: Neck supple.  Cardiovascular:     Rate and Rhythm: Normal rate and regular rhythm.     Heart sounds: No murmur.  Pulmonary:     Effort: Pulmonary effort is normal. No respiratory distress.     Breath sounds: Normal breath sounds.  Abdominal:     Palpations: Abdomen is soft.     Tenderness: There is no abdominal tenderness. There is no guarding or rebound.  Musculoskeletal:        General: No swelling or tenderness.  Skin:    General: Skin is warm and dry.  Neurological:     General: No focal deficit present.     Mental Status: She is alert and oriented to person, place, and time.     Cranial Nerves: No cranial nerve deficit.     Sensory: No sensory deficit.     Motor: No weakness.  Psychiatric:        Behavior: Behavior normal.      ED Treatments / Results  Labs (all labs ordered are listed, but only abnormal results are displayed) Labs Reviewed  CBC WITH DIFFERENTIAL/PLATELET - Abnormal; Notable for the following components:      Result Value   Hemoglobin 10.7 (*)    HCT 35.1 (*)    MCH 24.7 (*)    RDW 16.2 (*)    All other components within normal limits  COMPREHENSIVE  METABOLIC PANEL  PREGNANCY, URINE    EKG None  Radiology No results found.  Procedures Procedures (including critical care time)  Medications Ordered in ED Medications  sodium chloride 0.9 % bolus 1,000 mL (1,000 mLs  Intravenous New Bag/Given 05/31/18 1442)  metoCLOPramide (REGLAN) injection 10 mg (10 mg Intravenous Given 05/31/18 1443)     Initial Impression / Assessment and Plan / ED Course  I have reviewed the triage vital signs and the nursing notes.  Pertinent labs & imaging results that were available during my care of the patient were reviewed by me and considered in my medical decision making (see chart for details).       Patient here for evaluation of persistent headache. She is well appearing on evaluation with no focal neurologic deficits. She had worsening symptoms after being administered Toradol earlier today. Will treat with Reglan IV as well as IV fluids.   Labs demonstrate stable anemia, no significant electrolyte disturbance. Following Reglan administration she states that her headache is improving but does still having pins and needles sensation on her scalp. Discussed with patient that I do not feel that a CT head would be of benefit at this time is there is no clinical evidence of subarachnoid hemorrhage, space occupying lesion, do not feel that the risks outweigh the benefits. Offered patient additional medications to see if this is with her paresthesias and she declines. Plan to discharge home with outpatient follow-up and return precautions. Final Clinical Impressions(s) / ED Diagnoses   Final diagnoses:  Bad headache    ED Discharge Orders    None       Tilden Fossa, MD 05/31/18 1442    Tilden Fossa, MD 05/31/18 [phone]

## 2018-05-31 NOTE — ED Provider Notes (Signed)
[facility] EMERGENCY DEPARTMENT Provider Note   CSN: [id number] Arrival date & time: 05/31/18  [id number]    History   Chief Complaint Chief Complaint  Patient presents with  . Headache    HPI Diane Lloyd is a 28 y.o. female.     g  The history is provided by the patient and medical records. No language interpreter was used.  Headache   Diane Lloyd is a 28 y.o. female who presents to the Emergency Department complaining of HA. She presents to the emergency department complaining of two days of headache. Headache is located in the occipital region and she states that it began suddenly. It is waxing and waning with no clear alleviating or worsening factors. She feels like there is burning throughout her head as well as pins and needles throughout her entire body. She has associated nausea. She has occasional blurred vision. No numbness, weakness. She denies any fevers, cough, abdominal pain, dysuria. She had a similar headache in December of last year and had a CT head at that time and also saw neurology. She does have a history of hypertension, no additional medical problems. She denies any chance of pregnancy. She took extra strength Tylenol at home with no significant change in her headache. No one else in her household has a headache. There is no change in headache with positioning, meals, activity. Past Medical History:  Diagnosis Date  . Anemia    iron def  . Anxiety   . Asthma   . Hypertension   . Lupus (HCC)    + ANA in 2012  . Obesity, unspecified   . PCOS (polycystic ovarian syndrome)   . Positive ANA (antinuclear antibody) 05/05/2017   [facility] [place]; ANA 1:160, speckled pattern    Patient Active Problem List   Diagnosis Date Noted  . Vaginal candidiasis 12/18/2015  . Diarrhea 11/10/2015  . Gastroenteritis 09/24/2015  . Screen for STD (sexually transmitted disease) 09/21/2015  . Rash and nonspecific skin eruption  08/31/2015  . Dysmenorrhea 08/31/2015  . Allergic rhinitis 05/17/2013  . GERD (gastroesophageal reflux disease) 03/20/2012  . Polycystic ovarian syndrome 12/26/2011  . Chest pain 11/01/2011  . Family history of cardiac disorder 02/17/2011  . Contraception management 12/30/2010  . 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 Essential hypertension 01/27/2010  . ANEMIA, IRON DEFICIENCY 06/27/2009  . Obesity 03/10/2006  . ASTHMA, INTERMITTENT 03/10/2006  . ECZEMA, ATOPIC DERMATITIS 03/10/2006    Past Surgical History:  Procedure Laterality Date  . TONSILLECTOMY       OB History    Gravida  0   Para      Term      Preterm      AB      Living        SAB      TAB      Ectopic      Multiple      Live Births               Home Medications    Prior to Admission medications   Medication Sig Start Date End Date Taking? Authorizing Provider  acetaminophen (TYLENOL) 500 MG tablet Take 500 mg by mouth every 6 (six) hours as needed for headache.   Yes [provider]  atenolol (TENORMIN) 25 MG tablet Take 0.5 tablets (12.5 mg total) by mouth 2 (two) times daily. 04/17/18  Yes Fulp, Cammie, MD  lisinopril-hydrochlorothiazide (PRINZIDE,ZESTORETIC) 10-12.5 MG  tablet Take 1 tablet by mouth daily. To lower blood pressure 02/06/18  Yes Fulp, Cammie, MD  omeprazole (PRILOSEC) 40 MG capsule Take 40 mg by mouth daily.   Yes [provider]  ferrous sulfate 325 (65 FE) MG tablet Take 1 tablet (325 mg total) by mouth daily with breakfast. TAKE WITH ORANGE JUICE 04/28/18   Street, Weston, PA-C  SPRINTEC 28 0.25-35 MG-MCG tablet Take 1 tablet by mouth daily.  08/26/17   [provider]    Family History Family History  Problem Relation Age of Onset  . Cancer Father 52       esophagus  . Asthma Father   . Esophageal cancer Father   . Hypertension Mother   . Lupus Mother   . Diabetes Mother   . Cancer Maternal Uncle 40        pancreatic cancer  . Cancer Paternal Grandmother 63       esophageal  . Colon cancer Neg Hx   . Ulcerative colitis Neg Hx     Social History Social History   Tobacco Use  . Smoking status: Never Smoker  . Smokeless tobacco: Never Used  Substance Use Topics  . Alcohol use: Yes    Comment: occasional  . Drug use: Not Currently    Types: Marijuana    Comment: LAST TIME- 2 YEARS AGO     Allergies   Diflucan [fluconazole]   Review of Systems Review of Systems  Neurological: Positive for headaches.  All other systems reviewed and are negative.    Physical Exam Updated Vital Signs BP 122/77 (BP Location: Right Arm)   Pulse 76   Temp 98.3 F (36.8 C) (Oral)   Resp 18   Ht 5\' 11"  (1.803 m)   Wt 102.1 kg   LMP 04/19/2018   SpO2 99%   BMI 31.38 kg/m   Physical Exam Vitals signs and nursing note reviewed.  Constitutional:      Appearance: She is well-developed.  HENT:     Head: Normocephalic and atraumatic.     Right Ear: Tympanic membrane normal.     Left Ear: Tympanic membrane normal.  Eyes:     Extraocular Movements: Extraocular movements intact.     Pupils: Pupils are equal, round, and reactive to light.  Cardiovascular:     Rate and Rhythm: Normal rate and regular rhythm.     Heart sounds: No murmur.  Pulmonary:     Effort: Pulmonary effort is normal. No respiratory distress.     Breath sounds: Normal breath sounds.  Abdominal:     Palpations: Abdomen is soft.     Tenderness: There is no abdominal tenderness. There is no guarding or rebound.  Musculoskeletal:        General: No swelling or tenderness.  Skin:    General: Skin is warm and dry.     Capillary Refill: Capillary refill takes less than 2 seconds.  Neurological:     General: No focal deficit present.     Mental Status: She is alert and oriented to person, place, and time.     Cranial Nerves: No cranial nerve deficit.     Motor: No weakness.     Comments: Visual fields are grossly intact.  No pronator drift. Five out of five strength in all four extremities with sensation to light touch intact in all four extremities  Psychiatric:        Behavior: Behavior normal.      ED Treatments / Results  Labs (  all labs ordered are listed, but only abnormal results are displayed) Labs Reviewed - No data to display  EKG None  Radiology No results found.  Procedures Procedures (including critical care time)  Medications Ordered in ED Medications  ketorolac (TORADOL) injection 60 mg (has no administration in time range)     Initial Impression / Assessment and Plan / ED Course  I have reviewed the triage vital signs and the nursing notes.  Pertinent labs & imaging results that were available during my care of the patient were reviewed by me and considered in my medical decision making (see chart for details).        Patient with history of hypertension here for evaluation of headache for two days. She has experienced similar episodes in the past. She is neurologically intact on examination and nontoxic appearing. Presentation is not consistent with subarachnoid hemorrhage, CVA, meningitis, dural sinus thrombosis. Discussed with patient symptomatic treatment.  She is feeling improved on recheck following treatment in the ED.  Outpatient follow up and return precautions.    Final Clinical Impressions(s) / ED Diagnoses   Final diagnoses:  Bad headache    ED Discharge Orders    None       Tilden Fossa, MD 05/31/18 [phone]

## 2018-05-31 NOTE — ED Triage Notes (Signed)
Pt c/o HA "feels like burning-pins and needles" x 2 days-denies injury- pain is "all over" head-NAD-steady gait-was seen here earlier for same-advised by PCP to come back to ED due to cont'd HA-NAD-steady gait

## 2018-05-31 NOTE — Telephone Encounter (Signed)
Spoke with patient and informed her with what provider stated. Patient verbalized understanding.  

## 2018-05-31 NOTE — ED Triage Notes (Signed)
"   I have had head pressure x 2 days and feels like pins and needles all over since Sat" Has had the same in past

## 2018-05-31 NOTE — Telephone Encounter (Signed)
Nurse called the patient's home phone number but received no answer and message was left on the voicemail for the patient to call back.  Return phone number given.  Noticed on encounter tab that the patient has gone to the ED.

## 2018-05-31 NOTE — Telephone Encounter (Signed)
Tried to reach patient x2, she's not answering, but should already have several phone messages left explaining that she will need to seek care at her primary office with dr fulp

## 2018-06-01 NOTE — Telephone Encounter (Signed)
Patient has scheduled with another provider.

## 2018-06-06 ENCOUNTER — Encounter: Payer: Self-pay | Admitting: Primary Care

## 2018-06-06 ENCOUNTER — Ambulatory Visit: Payer: Self-pay | Attending: Primary Care | Admitting: Primary Care

## 2018-06-06 ENCOUNTER — Other Ambulatory Visit: Payer: Self-pay

## 2018-06-06 ENCOUNTER — Ambulatory Visit: Payer: Self-pay | Admitting: Internal Medicine

## 2018-06-06 DIAGNOSIS — H539 Unspecified visual disturbance: Secondary | ICD-10-CM

## 2018-06-06 DIAGNOSIS — R519 Headache, unspecified: Secondary | ICD-10-CM

## 2018-06-06 NOTE — Progress Notes (Signed)
Virtual Visit via Telephone Note  I connected with [name] on 06/06/18 at  3:10 PM EDT by telephone and verified that I am speaking with the correct person using two identifiers.   I discussed the limitations, risks, security and privacy concerns of performing an evaluation and management service by telephone and the availability of in person appointments. I also discussed with the patient that there may be a patient responsible charge related to this service. The patient expressed understanding and agreed to proceed.   History of Present Illness: Patient is being seen for a hospital f/u for headaches. Last eye exam 2006.   Observations/Objective: Review of Systems  Constitutional: Negative.   HENT: Negative.   Eyes: Positive for blurred vision.  Respiratory: Negative.   Cardiovascular: Negative.   Gastrointestinal: Negative.   Genitourinary: Negative.   Musculoskeletal: Negative.   Skin: Negative.     Assessment and Plan: Diane Lloyd was seen today for hospitalization follow-up.  Diagnoses and all orders for this visit:  Visual disturbance Last visit ophthalmologist 2006 notice changing in vision and needs glasses. Maybe related to frequent h/a -     Ambulatory referral to Ophthalmology  Bilateral headaches OTC tylenol states works cont to take prn  -     Ambulatory referral to Ophthalmology    Follow Up Instructions:    I discussed the assessment and treatment plan with the patient. The patient was provided an opportunity to ask questions and all were answered. The patient agreed with the plan and demonstrated an understanding of the instructions.   The patient was advised to call back or seek an in-person evaluation if the symptoms worsen or if the condition fails to improve as anticipated.  I provided 12 minutes of non-face-to-face time during this encounter.   [name], NP

## 2018-06-06 NOTE — Progress Notes (Signed)
Patient verified DOB Patient has taken medication. Patient has not eaten today. Patient denies pain at this time. Patient complains of intermittent HA and pins and needles generalized over body. BP this am : 121/80 HR:76

## 2018-06-07 ENCOUNTER — Ambulatory Visit: Payer: Medicaid Other | Admitting: Family

## 2018-06-10 ENCOUNTER — Ambulatory Visit (HOSPITAL_COMMUNITY)
Admission: EM | Admit: 2018-06-10 | Discharge: 2018-06-10 | Disposition: A | Discharge status: Home / Self Care | Payer: Medicaid Other | Attending: Family Medicine | Admitting: Family Medicine

## 2018-06-10 ENCOUNTER — Other Ambulatory Visit: Payer: Self-pay

## 2018-06-10 DIAGNOSIS — R0789 Other chest pain: Secondary | ICD-10-CM

## 2018-06-10 NOTE — ED Triage Notes (Signed)
Per pt she is on a heat monitor and has very bad anxiety. Pt is saying chest pain with some sob. Pt is very tearful and says she has anxiety and has no medication refill and has to wait for dr to open up. NO DISTRESS NOTED

## 2018-06-10 NOTE — ED Provider Notes (Signed)
[facility]    CSN: [id number] Arrival date & time: 06/10/18  1036     History   Chief Complaint Chief Complaint  Patient presents with   Chest Pain    anxiety    HPI Diane Lloyd is a 28 y.o. female.   This is a 28 year old woman who comes in for evaluation of chest pain.  Per pt she is on a heat monitor and has very bad anxiety. Pt is saying chest pain with some sob. Pt is very tearful and says she has anxiety and has no medication refill and has to wait for dr to open up. NO DISTRESS NOTED  Dull chest pain began 4 hours PTA and started over left sternal border.  It has migrated to left anterior chest.  She is tender over 3rd and fourth ribs anteriorly.  No shortness of breath, F/H MI, leg pain, fever, cough, nausea or vomiting.      Note from April 07, 2018: Pt is a 28 year old female that presents with right-sided chest pain, arm pain.  This has been an ongoing problem for her for approximately 2 months.  She has had multiple work-ups in the ER and  been seen by cardiology for this since.  She is here today with concerns because the chest pain restarted today.  She is having some radiation of pain down her right arm with numbness and tingling.  The symptoms somewhat subsided after taking Tylenol and resting.  She is not currently having any chest pain or arm pain. Symptoms have resolved. Denies any recent strenuous activity or heavy lifting.  Patient does have a history of anxiety.  She was scheduled to have an appointment with cardiology today to do echocardiogram, stress test and have a Holter monitor placed.  This appointment was postponed due to the coronavirus pandemic.  She is very anxious that this did not occur and is still concerned about why she is having intermittent chest pain.  Denies any dizziness, headaches, vision changes.  Denies any shortness of breath or palpitations.  Denies any recent sick contacts or recent traveling.  No cough, congestion,  fevers, chills, body aches.  No calf pain, swelling.  No history of DVTs or PEs.     Past Medical History:  Diagnosis Date   Anemia    iron def   Anxiety    Asthma    Hypertension    Obesity, unspecified    PCOS (polycystic ovarian syndrome)    Positive ANA (antinuclear antibody) 05/05/2017   done by [facility] in [facility]; ANA 1:160, speckled pattern    Patient Active Problem List   Diagnosis Date Noted   Vaginal candidiasis 12/18/2015   Diarrhea 11/10/2015   Gastroenteritis 09/24/2015   Screen for STD (sexually transmitted disease) 09/21/2015   Rash and nonspecific skin eruption 08/31/2015   Dysmenorrhea 08/31/2015   Allergic rhinitis 05/17/2013   GERD (gastroesophageal reflux disease) 03/20/2012   Polycystic ovarian syndrome 12/26/2011   Chest pain 11/01/2011   Family history of cardiac disorder 02/17/2011   Contraception management 12/30/2010   Depression, major, single episode, 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 Authorizing Provider  acetaminophen (TYLENOL) 500 MG tablet Take 500 mg by mouth every 6 (six) hours as needed for headache.    [provider]  atenolol (TENORMIN) 25 MG tablet Take 0.5 tablets (12.5 mg total) by mouth 2 (two) times daily. 04/17/18   Fulp, Cammie, MD  ferrous sulfate 325 (65 FE) MG tablet Take 1 tablet (325 mg total) by mouth daily with breakfast. TAKE WITH ORANGE JUICE 04/28/18   Street, [place],  PA-C  lisinopril-hydrochlorothiazide (PRINZIDE,ZESTORETIC) 10-12.5 MG tablet Take 1 tablet by mouth daily. To lower blood pressure 02/06/18   Fulp, Cammie, MD  omeprazole (PRILOSEC) 40 MG capsule Take 40 mg by mouth daily.    [provider]  promethazine (PHENERGAN) 25 MG tablet Take 1 tablet (25 mg total) by mouth every 8 (eight) hours as needed for nausea or vomiting. 05/31/18   Tilden Fossa, MD  SPRINTEC 28 0.25-35 MG-MCG tablet Take 1 tablet by mouth daily.  08/26/17   [provider]    Family History Family History  Problem Relation Age of Onset   Cancer Father 17       esophagus   Asthma Father    Esophageal cancer Father    Hypertension Mother    Lupus Mother    Diabetes Mother    Cancer Maternal Uncle 75       pancreatic cancer   Cancer Paternal Grandmother 34       esophageal   Colon cancer Neg Hx    Ulcerative colitis Neg Hx     Social History Social History   Tobacco Use   Smoking status: Never Smoker   Smokeless tobacco: Never Used  Substance Use Topics   Alcohol use: Yes    Comment: occasional   Drug use: Not Currently    Types: Marijuana     Allergies   Diflucan [fluconazole]   Review of Systems Review of Systems  Cardiovascular: Positive for chest pain.  All other systems reviewed and are negative.    Physical Exam Triage Vital Signs ED Triage Vitals  Enc Vitals Group     BP 06/10/18 1142 131/69     Pulse Rate 06/10/18 1142 65     Resp 06/10/18 1142 16     Temp 06/10/18 1142 98.3 F (36.8 C)     Temp Source 06/10/18 1142 Oral     SpO2 06/10/18 1142 100 %     Weight --      Height --      Head Circumference --      Peak Flow --      Pain Score 06/10/18 1141 7     Pain Loc --      Pain Edu? --      Excl. in GC? --    No data found.  Updated Vital Signs BP 131/69 (BP Location: Right Arm)    Pulse 65    Temp 98.3 F (36.8 C) (Oral)    Resp 16    LMP 05/19/2018    SpO2 100%    Physical  Exam Vitals signs and nursing note reviewed.  Constitutional:      Appearance: She is well-developed. She is obese.  Eyes:     Pupils: Pupils are equal, round, and reactive to light.  Neck:     Musculoskeletal: Normal range of motion and neck supple.  Cardiovascular:     Rate and Rhythm: Normal rate and regular rhythm.     Heart sounds: Normal heart sounds.  Pulmonary:     Effort: Pulmonary effort is normal.     Breath sounds: Normal breath sounds.  Chest:     Chest wall: Tenderness present. No mass, deformity, crepitus or edema.  Abdominal:     Palpations: Abdomen is soft.  Musculoskeletal: Normal range of motion.  Skin:    General: Skin is warm and dry.  Neurological:     General: No focal deficit present.     Mental Status: She is alert.  Psychiatric:        Mood and Affect: Mood normal.        Behavior: Behavior normal.      UC Treatments / Results  Labs (all labs ordered are listed, but only abnormal results are displayed) Labs Reviewed - No data to display  EKG 12 lead EKG does not show significant interval change.  Radiology No results found.  Procedures Procedures (including critical care time)  Medications Ordered in UC Medications - No data to display  Initial Impression / Assessment and Plan / UC Course  I have reviewed the triage vital signs and the nursing notes.  Pertinent labs & imaging results that were available during my care of the patient were reviewed by me and considered in my medical decision making (see chart for details).    Final Clinical Impressions(s) / UC Diagnoses   Final diagnoses:  Chest wall pain     Discharge Instructions     Your EKG does not show significant interval change.    ED Prescriptions    None     Controlled Substance Prescriptions [place] Controlled Substance Registry consulted? Not Applicable   Elvina SidleLauenstein, Lunna Vogelgesang, MD 06/10/18 1236

## 2018-06-10 NOTE — Discharge Instructions (Addendum)
Your EKG does not show significant interval change.

## 2018-06-11 ENCOUNTER — Emergency Department (HOSPITAL_BASED_OUTPATIENT_CLINIC_OR_DEPARTMENT_OTHER)
Admission: EM | Admit: 2018-06-11 | Discharge: 2018-06-11 | Disposition: A | Discharge status: Home / Self Care | Payer: Medicaid Other | Attending: Emergency Medicine | Admitting: Emergency Medicine

## 2018-06-11 ENCOUNTER — Other Ambulatory Visit: Payer: Self-pay

## 2018-06-11 ENCOUNTER — Encounter (HOSPITAL_BASED_OUTPATIENT_CLINIC_OR_DEPARTMENT_OTHER): Payer: Self-pay | Admitting: Emergency Medicine

## 2018-06-11 ENCOUNTER — Emergency Department (HOSPITAL_BASED_OUTPATIENT_CLINIC_OR_DEPARTMENT_OTHER): Payer: Medicaid Other

## 2018-06-11 DIAGNOSIS — R079 Chest pain, unspecified: Secondary | ICD-10-CM | POA: Insufficient documentation

## 2018-06-11 DIAGNOSIS — J45909 Unspecified asthma, uncomplicated: Secondary | ICD-10-CM | POA: Insufficient documentation

## 2018-06-11 DIAGNOSIS — I10 Essential (primary) hypertension: Secondary | ICD-10-CM | POA: Insufficient documentation

## 2018-06-11 DIAGNOSIS — R002 Palpitations: Secondary | ICD-10-CM | POA: Insufficient documentation

## 2018-06-11 DIAGNOSIS — Z79899 Other long term (current) drug therapy: Secondary | ICD-10-CM | POA: Insufficient documentation

## 2018-06-11 LAB — COMPREHENSIVE METABOLIC PANEL
ALT: 20 U/L (ref 0–44)
AST: 21 U/L (ref 15–41)
Albumin: 3.9 g/dL (ref 3.5–5.0)
Alkaline Phosphatase: 44 U/L (ref 38–126)
Anion gap: 8 (ref 5–15)
BUN: 11 mg/dL (ref 6–20)
CO2: 24 mmol/L (ref 22–32)
Calcium: 8.9 mg/dL (ref 8.9–10.3)
Chloride: 104 mmol/L (ref 98–111)
Creatinine, Ser: 0.88 mg/dL (ref 0.44–1.00)
GFR calc Af Amer: >60 mL/min (ref >60)
GFR calc non Af Amer: >60 mL/min (ref >60)
Glucose, Bld: 103 mg/dL — ABNORMAL HIGH (ref 70–99)
Potassium: 3.7 mmol/L (ref 3.5–5.1)
Sodium: 136 mmol/L (ref 135–145)
Total Bilirubin: 0.5 mg/dL (ref 0.3–1.2)
Total Protein: 7.5 g/dL (ref 6.5–8.1)

## 2018-06-11 LAB — CBC WITH DIFFERENTIAL/PLATELET
Abs Immature Granulocytes: 0 10*3/uL (ref 0.00–0.07)
Basophils Absolute: 0 10*3/uL (ref 0.0–0.1)
Basophils Relative: 0 %
Eosinophils Absolute: 0 10*3/uL (ref 0.0–0.5)
Eosinophils Relative: 0 %
HCT: 36 % (ref 36.0–46.0)
Hemoglobin: 11.1 g/dL — ABNORMAL LOW (ref 12.0–15.0)
Immature Granulocytes: 0 %
Lymphocytes Relative: 34 %
Lymphs Abs: 1.7 10*3/uL (ref 0.7–4.0)
MCH: 24.7 pg — ABNORMAL LOW (ref 26.0–34.0)
MCHC: 30.8 g/dL (ref 30.0–36.0)
MCV: 80 fL (ref 80.0–100.0)
Monocytes Absolute: 0.4 10*3/uL (ref 0.1–1.0)
Monocytes Relative: 7 %
Neutro Abs: 3 10*3/uL (ref 1.7–7.7)
Neutrophils Relative %: 59 %
Platelets: 330 10*3/uL (ref 150–400)
RBC: 4.5 MIL/uL (ref 3.87–5.11)
RDW: 15.7 % — ABNORMAL HIGH (ref 11.5–15.5)
WBC: 5.2 10*3/uL (ref 4.0–10.5)
nRBC: 0 % (ref 0.0–0.2)

## 2018-06-11 LAB — PREGNANCY, URINE: Preg Test, Ur: NEGATIVE

## 2018-06-11 LAB — TROPONIN I
Troponin I: <0.03 ng/mL (ref <0.03)
Troponin I: <0.03 ng/mL (ref <0.03)

## 2018-06-11 LAB — LIPASE, BLOOD: Lipase: 44 U/L (ref 11–51)

## 2018-06-11 MED ORDER — ACETAMINOPHEN 500 MG PO TABS
1000.0000 mg | ORAL_TABLET | Freq: Once | ORAL | Status: AC
  Administered 2018-06-11: 09:00:00 1000 mg via ORAL
  Filled 2018-06-11: qty 2

## 2018-06-11 NOTE — ED Provider Notes (Signed)
[facility] EMERGENCY DEPARTMENT Provider Note   CSN: [id number] Arrival date & time: 06/11/18  4098    History   Chief Complaint Chief Complaint  Patient presents with  . Chest Pain    HPI Diane Lloyd is a 28 y.o. female.     HPI  Episode of chest pain, began yesterday, continued through the night.  Coming and going.  Currently 5/10.  This morning when stood up heart was racing and had heart rate of 168, lasted about 5 minutes then improved.  Took her atenolol this am, was previously prescribed by PCP>   Chest pain feels like sharp pain and dull ache, left chest radiating upwards, sometimes radiating to right.  When lean forward is worse, laying down is better. Have had occasional pins and needles all over with it.  Had some nausea and diaphoresis last night. No shortness of breath, no leg pain or swelling.  Not on OCPs. No recent surgeries. Drove to [place] on Sunday and back next day.  No hx of DVT/PE.  No smoking, etoh, other drugs.  Mom has hx of irregular heart beat and mini-strokes, uncle has hx of CAD.  This episode feels different than previous. Had seen Cardiology, scheduled for stress test. Had ECHO and monitor placed.   Wearing monitor from Cardiology, will come of 6/10   Past Medical History:  Diagnosis Date  . Anemia    iron def  . Anxiety   . Asthma   . Hypertension   . Obesity, unspecified   . PCOS (polycystic ovarian syndrome)   . Positive ANA (antinuclear antibody) 05/05/2017   done by [facility] in [facility]; ANA 1:160, speckled pattern    Patient Active Problem List   Diagnosis Date Noted  . Vaginal candidiasis 12/18/2015  . Diarrhea 11/10/2015  . Gastroenteritis 09/24/2015  . Screen for STD (sexually transmitted disease) 09/21/2015  . Rash and nonspecific skin eruption 08/31/2015  . Dysmenorrhea 08/31/2015  . Allergic rhinitis 05/17/2013  . GERD (gastroesophageal reflux disease) 03/20/2012  . Polycystic ovarian syndrome  12/26/2011  . Chest pain 11/01/2011  . Family history of cardiac disorder 02/17/2011  . Contraception management 12/30/2010  . 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 Essential hypertension 01/27/2010  . ANEMIA, IRON DEFICIENCY 06/27/2009  . Obesity 03/10/2006  . ASTHMA, INTERMITTENT 03/10/2006  . ECZEMA, ATOPIC DERMATITIS 03/10/2006    Past Surgical History:  Procedure Laterality Date  . TONSILLECTOMY       OB History    Gravida  0   Para      Term      Preterm      AB      Living        SAB      TAB      Ectopic      Multiple      Live Births               Home Medications    Prior to Admission medications   Medication Sig Start Date End Date Taking? Authorizing Provider  atenolol (TENORMIN) 25 MG tablet Take 0.5 tablets (12.5 mg total) by mouth 2 (two) times daily. 04/17/18  Yes Fulp, Cammie, MD  lisinopril-hydrochlorothiazide (PRINZIDE,ZESTORETIC) 10-12.5 MG tablet Take 1 tablet by mouth daily. To lower blood pressure 02/06/18  Yes Fulp, Cammie, MD  omeprazole (PRILOSEC) 40 MG capsule Take 40 mg by mouth daily.   Yes [provider]  acetaminophen (TYLENOL) 500 MG tablet Take  500 mg by mouth every 6 (six) hours as needed for headache.    [provider]  ferrous sulfate 325 (65 FE) MG tablet Take 1 tablet (325 mg total) by mouth daily with breakfast. TAKE WITH ORANGE JUICE 04/28/18   Street, [place], PA-C  promethazine (PHENERGAN) 25 MG tablet Take 1 tablet (25 mg total) by mouth every 8 (eight) hours as needed for nausea or vomiting. 05/31/18   Tilden Fossa, MD  SPRINTEC 28 0.25-35 MG-MCG tablet Take 1 tablet by mouth daily.  08/26/17   [provider]    Family History Family History  Problem Relation Age of Onset  . Cancer Father 57       esophagus  . Asthma Father   . Esophageal cancer Father   . Hypertension Mother   . Lupus Mother   . Diabetes Mother   . Cancer Maternal Uncle 40        pancreatic cancer  . Cancer Paternal Grandmother 50       esophageal  . Colon cancer Neg Hx   . Ulcerative colitis Neg Hx     Social History Social History   Tobacco Use  . Smoking status: Never Smoker  . Smokeless tobacco: Never Used  Substance Use Topics  . Alcohol use: Yes    Comment: occasional  . Drug use: Not Currently    Types: Marijuana     Allergies   Diflucan [fluconazole]   Review of Systems Review of Systems  Constitutional: Negative for fever.  HENT: Negative for sore throat.   Eyes: Negative for visual disturbance.  Respiratory: Negative for cough and shortness of breath.   Cardiovascular: Positive for chest pain and palpitations. Negative for leg swelling.  Gastrointestinal: Positive for nausea. Negative for abdominal pain (has intermittent RUQ abdominal pain, had it last night but resolved. ), diarrhea and vomiting.  Genitourinary: Negative for difficulty urinating and enuresis.  Musculoskeletal: Negative for back pain and neck pain.  Skin: Negative for rash.  Neurological: Positive for light-headedness. Negative for syncope and headaches.     Physical Exam Updated Vital Signs BP 124/84 (BP Location: Right Arm)   Pulse 75   Temp 98.5 F (36.9 C) (Oral)   Resp 16   Ht 5\' 11"  (1.803 m)   Wt 99.8 kg   LMP 05/19/2018   SpO2 99%   BMI 30.68 kg/m   Physical Exam Vitals signs and nursing note reviewed.  Constitutional:      General: She is not in acute distress.    Appearance: She is well-developed. She is not diaphoretic.  HENT:     Head: Normocephalic and atraumatic.  Eyes:     Conjunctiva/sclera: Conjunctivae normal.  Neck:     Musculoskeletal: Normal range of motion.  Cardiovascular:     Rate and Rhythm: Normal rate and regular rhythm.     Heart sounds: Normal heart sounds. No murmur. No friction rub. No gallop.   Pulmonary:     Effort: Pulmonary effort is normal. No respiratory distress.     Breath sounds: Normal breath sounds. No  wheezing or rales.  Abdominal:     General: There is no distension.     Palpations: Abdomen is soft.     Tenderness: There is no abdominal tenderness. There is no guarding.  Musculoskeletal:        General: No tenderness.  Skin:    General: Skin is warm and dry.     Findings: No erythema or rash.  Neurological:  Mental Status: She is alert and oriented to person, place, and time.      ED Treatments / Results  Labs (all labs ordered are listed, but only abnormal results are displayed) Labs Reviewed  CBC WITH DIFFERENTIAL/PLATELET - Abnormal; Notable for the following components:      Result Value   Hemoglobin 11.1 (*)    MCH 24.7 (*)    RDW 15.7 (*)    All other components within normal limits  COMPREHENSIVE METABOLIC PANEL - Abnormal; Notable for the following components:   Glucose, Bld 103 (*)    All other components within normal limits  PREGNANCY, URINE  LIPASE, BLOOD  TROPONIN I  TROPONIN I    EKG EKG Interpretation  Date/Time:  Sunday Jun 11 2018 06:49:06 EDT Ventricular Rate:  91 PR Interval:    QRS Duration: 98 QT Interval:  350 QTC Calculation: 431 R Axis:   55 Text Interpretation:  Sinus rhythm No significant change since last tracing Confirmed by Ward, Kristen ([id number]) on 06/11/2018 6:53:19 AM   Radiology Dg Chest 2 View  Result Date: 06/11/2018 CLINICAL DATA:  Chest pain x1 day EXAM: CHEST - 2 VIEW COMPARISON:  04/28/2018 FINDINGS: Lungs are clear.  No pleural effusion or pneumothorax. The heart is normal in size. Visualized osseous structures are within normal limits. IMPRESSION: Normal chest radiographs. Electronically Signed   By: Sriyesh  Krishnan M.D.   On: 06/11/2018 08:14    Procedures Procedures (including critical care time)  Medications Ordered in ED Medications  acetaminophen (TYLENOL) tablet 1,000 mg (1,000 mg Oral Given 06/11/18 0917)     Initial Impression / Assessment and Plan / ED Course  I have reviewed the triage vital signs  and the nursing notes.  Pertinent labs & imaging results that were available during my care of the patient were reviewed by me and considered in my medical decision making (see chart for details).        28 yo female with history of hypertension, asthma, PCOS, episodes of palpitations and chest pain for which she has seen Cardiology, has 30 day [employer] event monitor in place who presents with chest pain beginning yesterday and episode of palpitations today. Differential diagnosis for chest pain includes pulmonary embolus, dissection, pneumothorax, pneumonia, ACS, myocarditis, pericarditis, anxiety.  EKG was done and evaluate by me and showed no acute ST changes and no signs of pericarditis. Chest x-ray was done and evaluated by me and radiology and showed no sign of pneumonia or pneumothorax. Patient is PERC negative and low risk Wells and have low suspicion for PE.  Patient is low risk HEART score and had delta troponins which were both negative.  Called Dr. Tenny Crawoss, Cardiology as patient has event monitor in place. This type will need to be turned in to examine results. Recommend she continue to wear the monitor and record events.  In the emergency department, she was monitored without any signs of arrythmia .  Recommend continued follow up with PCP and Cardiology.   Final Clinical Impressions(s) / ED Diagnoses   Final diagnoses:  Chest pain, unspecified type  Palpitations    ED Discharge Orders    None       Alvira MondaySchlossman, Talbert Trembath, MD 06/11/18 1137

## 2018-06-11 NOTE — ED Triage Notes (Signed)
Pt reports substernal chest pain radiating to L shoulder since yesterday. Reports some sweating and nausea. Wearing holter monitor. Seen yesterday at UC, reports EKG at that time was fine. States this morning, pain was worse noted her HR to be 168. Pt took atenolol and BP meds at that time and came to ED.

## 2018-06-15 ENCOUNTER — Encounter: Payer: Self-pay | Admitting: Family Medicine

## 2018-06-15 ENCOUNTER — Telehealth: Payer: Self-pay | Admitting: Family Medicine

## 2018-06-15 ENCOUNTER — Other Ambulatory Visit: Payer: Self-pay

## 2018-06-15 ENCOUNTER — Other Ambulatory Visit (HOSPITAL_COMMUNITY)
Admission: RE | Admit: 2018-06-15 | Discharge: 2018-06-15 | Disposition: A | Discharge status: Home / Self Care | Payer: Medicaid Other | Source: Ambulatory Visit | Attending: Family Medicine | Admitting: Family Medicine

## 2018-06-15 ENCOUNTER — Ambulatory Visit: Payer: Medicaid Other | Attending: Family Medicine | Admitting: Family Medicine

## 2018-06-15 DIAGNOSIS — N898 Other specified noninflammatory disorders of vagina: Secondary | ICD-10-CM

## 2018-06-15 DIAGNOSIS — R300 Dysuria: Secondary | ICD-10-CM | POA: Insufficient documentation

## 2018-06-15 DIAGNOSIS — N3001 Acute cystitis with hematuria: Secondary | ICD-10-CM

## 2018-06-15 DIAGNOSIS — R350 Frequency of micturition: Secondary | ICD-10-CM

## 2018-06-15 LAB — POCT URINALYSIS DIP (CLINITEK)
Bilirubin, UA: NEGATIVE
Glucose, UA: NEGATIVE mg/dL
Nitrite, UA: NEGATIVE
POC PROTEIN,UA: NEGATIVE
Spec Grav, UA: 1.015
Urobilinogen, UA: 4 U/dL — AB
pH, UA: 7

## 2018-06-15 MED ORDER — SULFAMETHOXAZOLE-TRIMETHOPRIM 800-160 MG PO TABS: 1.0000 | ORAL_TABLET | Freq: Two times a day (BID) | ORAL | 0 refills | Status: DC

## 2018-06-15 NOTE — Progress Notes (Signed)
Per patient  Dull aches in stomach to her back Body aches all over  Headaches.  Taking OTC medications for yeast infection with cottage cheese discharge.   Sx's onset 3 days ago  LMP- 05/19/2018  Instructed to come in to leave specimen  Has taken ES Tylenol. Helps sometimes.

## 2018-06-15 NOTE — Telephone Encounter (Addendum)
Attempt to call patient to make an appointment discuss sx's with PCP. LMOVM to return call.   Per patient  Dull aches in stomach to her back Body aches all over  Headaches. Taking OTC medications for yeast infection with cottage cheese discharge.   Sx's onset 3 days ago  LMP- 05/19/2018   Has taken ES Tylenol. Helps sometimes.   Scheduled an appointment for today- Televisit

## 2018-06-15 NOTE — Telephone Encounter (Signed)
Patient called stating she has been having body and stomach ache for the past 3 days. Please follow up

## 2018-06-15 NOTE — Progress Notes (Signed)
Virtual Visit via Telephone Note  I connected with Diane Lloyd on 06/15/18 at  2:30 PM EDT by telephone and verified that I am speaking with the correct person using two identifiers.   I discussed the limitations, risks, security and privacy concerns of performing an evaluation and management service by telephone and the availability of in person appointments. I also discussed with the patient that there may be a patient responsible charge related to this service. The patient expressed understanding and agreed to proceed.  Patient Location: Home Provider Location: Office Others participating in call: Guillermina [place] Richard, RMA   History of Present Illness:       28 year old female with complaint of upper and lower abdominal pain.  She states that the pain in her upper and lower abdomen has been occurring for the past 3 days.  She feels that she has a dull aching sensation on the sides of her abdomen which radiates to her back.  She feels as if she has had recent onset of fever and chills as well as frequent urination in addition to the pain in the lower abdomen.  She denies any possibility of pregnancy as she states that she has had a recent negative urine pregnancy test at the hospital.  She has felt slightly nauseous.  She also feels as if she has had some white clumpy discharge for the past 3 days and has been using over-the-counter Monistat which she started yesterday.  She also feels as if she has had some generalized body aches as well as a frontal headache and a sore throat last week.  She states that she did have testing for COVID which was negative.      Past Medical History:  Diagnosis Date  . Anemia    iron def  . Anxiety   . Asthma   . Hypertension   . Obesity, unspecified   . PCOS (polycystic ovarian syndrome)   . Positive ANA (antinuclear antibody) 05/05/2017   [facility]; ANA 1:160, speckled pattern    Past Surgical History:  Procedure  Laterality Date  . TONSILLECTOMY      Family History  Problem Relation Age of Onset  . Cancer Father [id number]       esophagus  . Asthma Father   . Esophageal cancer Father   . Hypertension Mother   . Lupus Mother   . Diabetes Mother   . Cancer Maternal Uncle 40       pancreatic cancer  . Cancer Paternal Grandmother [id number]       esophageal  . Colon cancer Neg Hx   . Ulcerative colitis Neg Hx     Social History   Tobacco Use  . Smoking status: Never Smoker  . Smokeless tobacco: Never Used  Substance Use Topics  . Alcohol use: Yes    Comment: occasional  . Drug use: Not Currently    Types: Marijuana     Allergies  Allergen Reactions  . Diflucan [Fluconazole] Itching and Rash       Observations/Objective: No vital signs or physical exam conducted as visit was done via telephone  Assessment and Plan: 1. Urinary frequency Patient has been asked to make a lab visit before 5 PM today to leave urine sample for urinalysis secondary to her complaints of abdominal pain and urinary frequency.  Patient was suspected urinary tract infection. - POCT URINALYSIS DIP (CLINITEK)  2. Dysuria; 3.  Vaginal discharge She has complaint of dysuria, urinary frequency and  vaginal discharge.  She will come in today for lab visit for urine cytology and urinalysis to look for possible urinary tract infection and/or STI/BV or candidal infection.  She reports that she has started the use of over-the-counter Monistat for treatment of possible yeast infection. - POCT URINALYSIS DIP (CLINITEK) - Urine cytology ancillary only  4. Acute cystitis with hematuria Patient with abnormal findings on urinalysis consistent with acute cystitis with hematuria.  Prescription has been sent to patient's pharmacy for Septra DS twice daily x7 days while urine culture is pending.  She will be notified if a change in antibiotic therapy is needed based on the results.  She is also encouraged to increase water/fluid intake. -  Urine Culture  Follow Up Instructions:Return in about 1 week (around 06/22/2018) for f/u in 1-2 weeks if symptoms continue.    I discussed the assessment and treatment plan with the patient. The patient was provided an opportunity to ask questions and all were answered. The patient agreed with the plan and demonstrated an understanding of the instructions.   The patient was advised to call back or seek an in-person evaluation if the symptoms worsen or if the condition fails to improve as anticipated.  I provided 16 minutes of non-face-to-face time during this encounter.   Cain Saupe, MD

## 2018-06-18 LAB — URINE CULTURE

## 2018-06-19 LAB — URINE CYTOLOGY ANCILLARY ONLY
Chlamydia: NEGATIVE
Neisseria Gonorrhea: NEGATIVE
Trichomonas: NEGATIVE

## 2018-06-20 ENCOUNTER — Ambulatory Visit (INDEPENDENT_AMBULATORY_CARE_PROVIDER_SITE_OTHER): Payer: Self-pay | Admitting: Internal Medicine

## 2018-06-20 ENCOUNTER — Other Ambulatory Visit: Payer: Self-pay

## 2018-06-20 ENCOUNTER — Encounter: Payer: Self-pay | Admitting: Internal Medicine

## 2018-06-20 ENCOUNTER — Ambulatory Visit: Payer: Medicaid Other | Attending: Primary Care | Admitting: Primary Care

## 2018-06-20 ENCOUNTER — Encounter: Payer: Self-pay | Admitting: Primary Care

## 2018-06-20 DIAGNOSIS — F410 Panic disorder [episodic paroxysmal anxiety] without agoraphobia: Secondary | ICD-10-CM | POA: Insufficient documentation

## 2018-06-20 DIAGNOSIS — F320 Major depressive disorder, single episode, mild: Secondary | ICD-10-CM

## 2018-06-20 DIAGNOSIS — F419 Anxiety disorder, unspecified: Secondary | ICD-10-CM

## 2018-06-20 DIAGNOSIS — K219 Gastro-esophageal reflux disease without esophagitis: Secondary | ICD-10-CM

## 2018-06-20 DIAGNOSIS — F411 Generalized anxiety disorder: Secondary | ICD-10-CM | POA: Insufficient documentation

## 2018-06-20 DIAGNOSIS — R1013 Epigastric pain: Secondary | ICD-10-CM

## 2018-06-20 DIAGNOSIS — D509 Iron deficiency anemia, unspecified: Secondary | ICD-10-CM

## 2018-06-20 DIAGNOSIS — I10 Essential (primary) hypertension: Secondary | ICD-10-CM

## 2018-06-20 DIAGNOSIS — E282 Polycystic ovarian syndrome: Secondary | ICD-10-CM

## 2018-06-20 NOTE — Progress Notes (Signed)
Virtual Visit via Video Note  I connected with Diane Lloyd on 06/20/18 at  1:30 PM EDT by a video enabled telemedicine application and verified that I am speaking with the correct person using two identifiers.  Location patient: home Location provider: work office Persons participating in the virtual visit: patient, provider  I discussed the limitations of evaluation and management by telemedicine and the availability of in person appointments. The patient expressed understanding and agreed to proceed.   HPI: This is a scheduled visit to establish care and to discuss chronic medical conditions.  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  She is a never smoker, drinks alcohol rarely, used to smoke THC, last used in 2017.  Her family history is significant for father with esophageal cancer, maternal grandmother with pancreatic cancer, paternal grandmother with breast cancer, a mother with hypertension and TIAs and a maternal uncle with a prior stroke.  She was recently diagnosed with a UTI at urgent care and completed 5 days of Bactrim, states all her symptoms have resolved.  She is due for CPE.  Her past medical history significant for:  1.  Hypertension that has been well controlled on lisinopril/HCTZ, atenolol.  She tells me her blood pressure today was 126/77.  2.  Obesity  3.  GERD well-controlled on omeprazole  4.  Depression/anxiety, she has a therapist and has a scheduled appointment with psychiatry on June 16 to discuss medication management.  5.  Polycystic ovarian syndrome  She has no acute complaints today.   ROS: Constitutional: Denies fever, chills, diaphoresis, appetite change and fatigue.  HEENT: Denies photophobia, eye pain, redness, hearing loss, ear pain, congestion, sore throat, rhinorrhea, sneezing, mouth sores, trouble swallowing, neck pain, neck stiffness and tinnitus.   Respiratory: Denies SOB, DOE, cough, chest tightness,  and  wheezing.   Cardiovascular: Denies chest pain, palpitations and leg swelling.  Gastrointestinal: Denies nausea, vomiting, abdominal pain, diarrhea, constipation, blood in stool and abdominal distention.  Genitourinary: Denies dysuria, urgency, frequency, hematuria, flank pain and difficulty urinating.  Endocrine: Denies: hot or cold intolerance, sweats, changes in hair or nails, polyuria, polydipsia. Musculoskeletal: Denies myalgias, back pain, joint swelling, arthralgias and gait problem.  Skin: Denies pallor, rash and wound.  Neurological: Denies dizziness, seizures, syncope, weakness, light-headedness, numbness and headaches.  Hematological: Denies adenopathy. Easy bruising, personal or family bleeding history  Psychiatric/Behavioral: Denies suicidal ideation, mood changes, confusion, nervousness, sleep disturbance and agitation   Past Medical History:  Diagnosis Date  . Anemia    iron def  . Anxiety   . Asthma   . Hypertension   . Obesity, unspecified   . PCOS (polycystic ovarian syndrome)   . Positive ANA (antinuclear antibody) 05/05/2017   done by [facility] in [facility]; ANA 1:160, speckled pattern    Past Surgical History:  Procedure Laterality Date  . TONSILLECTOMY      Family History  Problem Relation Age of Onset  . Cancer Father 72       esophagus  . Asthma Father   . Esophageal cancer Father   . Hypertension Mother   . Lupus Mother   . Diabetes Mother   . Cancer Maternal Uncle 40       pancreatic cancer  . Cancer Paternal Grandmother 60       esophageal  . Colon cancer Neg Hx   . Ulcerative colitis Neg Hx     SOCIAL HX:   reports that she has  never smoked. She has never used smokeless tobacco. She reports current alcohol use. She reports previous drug use. Drug: Marijuana.   Current Outpatient Medications:  .  acetaminophen (TYLENOL) 500 MG tablet, Take 500 mg by mouth every 6 (six) hours as needed for headache., Disp: , Rfl:  .  atenolol  (TENORMIN) 25 MG tablet, Take 0.5 tablets (12.5 mg total) by mouth 2 (two) times daily., Disp: 90 tablet, Rfl: 0 .  ferrous sulfate 325 (65 FE) MG tablet, Take 1 tablet (325 mg total) by mouth daily with breakfast. TAKE WITH ORANGE JUICE (Patient not taking: Reported on 06/15/2018), Disp: 30 tablet, Rfl: 0 .  lisinopril-hydrochlorothiazide (PRINZIDE,ZESTORETIC) 10-12.5 MG tablet, Take 1 tablet by mouth daily. To lower blood pressure, Disp: 90 tablet, Rfl: 1 .  omeprazole (PRILOSEC) 40 MG capsule, Take 40 mg by mouth daily., Disp: , Rfl:  .  promethazine (PHENERGAN) 25 MG tablet, Take 1 tablet (25 mg total) by mouth every 8 (eight) hours as needed for nausea or vomiting. (Patient not taking: Reported on 06/15/2018), Disp: 8 tablet, Rfl: 0 .  SPRINTEC 28 0.25-35 MG-MCG tablet, Take 1 tablet by mouth daily. , Disp: , Rfl: 1 .  sulfamethoxazole-trimethoprim (BACTRIM DS) 800-160 MG tablet, Take 1 tablet by mouth 2 (two) times daily., Disp: 6 tablet, Rfl: 0  EXAM:   VITALS per patient if applicable: Vital signs as reported by patient today: Blood pressure 126/77, heart rate 84, temperature 98.6  GENERAL: alert, oriented, appears well and in no acute distress  HEENT: atraumatic, conjunttiva clear, no obvious abnormalities on inspection of external nose and ears  NECK: normal movements of the head and neck  LUNGS: on inspection no signs of respiratory distress, breathing rate appears normal, no obvious gross increased work of breathing, gasping or wheezing  CV: no obvious cyanosis  MS: moves all visible extremities without noticeable abnormality  PSYCH/NEURO: pleasant and cooperative, no obvious depression or anxiety, speech and thought processing grossly intact  ASSESSMENT AND PLAN:   Class 2 severe obesity due to excess calories with serious comorbidity in adult, unspecified BMI (HCC) -Discussed healthy lifestyle, including increased physical activity and better food choices to promote weight  loss.  Iron deficiency anemia, unspecified iron deficiency anemia type -Recent hemoglobin of 11.1, no recent iron studies.  Essential hypertension -Appears well-controlled on current regimen.  Depression, major, single episode, mild (HCC) Anxiety -Has follow-up scheduled with psychiatry on June 16 for medication management. -Continue CBT.  Gastroesophageal reflux disease, esophagitis presence not specified -Continue PPI therapy.  Polycystic ovarian syndrome -Noted  She will schedule return visit for physical.     I discussed the assessment and treatment plan with the patient. The patient was provided an opportunity to ask questions and all were answered. The patient agreed with the plan and demonstrated an understanding of the instructions.   The patient was advised to call back or seek an in-person evaluation if the symptoms worsen or if the condition fails to improve as anticipated.    Chaya JanEstela Hernandez Acosta, MD  [facility]

## 2018-06-20 NOTE — Progress Notes (Signed)
Headache and stomach pain for 4 days

## 2018-06-21 ENCOUNTER — Encounter: Payer: Self-pay | Admitting: Family Medicine

## 2018-06-21 ENCOUNTER — Ambulatory Visit: Payer: Self-pay | Admitting: *Deleted

## 2018-06-21 NOTE — Telephone Encounter (Signed)
Agree with triage's recommendation of increased fluids, as well as OTC pain relievers (tylenol/ibuprofen). To call back if no relief.

## 2018-06-21 NOTE — Telephone Encounter (Signed)
Pt calling with having a headache over the last 5 days. She has had headaches before but have not last this long. She saw her pcp yesterday for a new patient appointment but did not think she could discuss the headache at this visit. She also feels like she maybe dehydrated, taking a fluid pill and not really drinking a lot of water. She also stated that she will get dizzy. Advised to start drinking more water, get more rest and take Tylenol or Ibuprofen for her headache. Her b/p this morning was 127/46 and HR 72. Temp 98.7.  Pt voiced understanding. notified LB at [facility] for possible appointment and recommendation. Routing triage to the office for review. Reason for Disposition . [1] MODERATE headache (e.g., interferes with normal activities) AND [2] present > 24 hours AND [3] unexplained  (Exceptions: analgesics not tried, typical migraine, or headache part of viral illness)  Answer Assessment - Initial Assessment Questions 1. LOCATION: "Where does it hurt?"     In the front and it varies 2. ONSET: "When did the headache start?" (Minutes, hours or days)      5 days ago 3. PATTERN: "Does the pain come and go, or has it been constant since it started?"     Comes and goes 4. SEVERITY: "How bad is the pain?" and "What does it keep you from doing?"  (e.g., Scale 1-10; mild, moderate, or severe)   - MILD (1-3): doesn't interfere with normal activities    - MODERATE (4-7): interferes with normal activities or awakens from sleep    - SEVERE (8-10): excruciating pain, unable to do any normal activities        Pain# 8 5. RECURRENT SYMPTOM: "Have you ever had headaches before?" If so, ask: "When was the last time?" and "What happened that time?"      Has had headaches and they would just go away, not like this 6. CAUSE: "What do you think is causing the headache?"     no 7. MIGRAINE: "Have you been diagnosed with migraine headaches?" If so, ask: "Is this headache similar?"      no 8. HEAD  INJURY: "Has there been any recent injury to the head?"      no 9. OTHER SYMPTOMS: "Do you have any other symptoms?" (fever, stiff neck, eye pain, sore throat, cold symptoms)     Fever, sore throat last week 10. PREGNANCY: "Is there any chance you are pregnant?" "When was your last menstrual period?"       Not sure. The last period was May 8 th. Just stopped birth control and period was supposed to start 3 days ago  Protocols used: HEADACHE-A-AH

## 2018-06-22 ENCOUNTER — Telehealth: Payer: Self-pay | Admitting: Internal Medicine

## 2018-06-22 NOTE — Progress Notes (Signed)
Per pt she is having headaches and head pressure and right side of her stomach and the pain radiates to her right back. Per pt it do not matter what she's doing. Per pt headache for 6 days and head pressure started 3 days ago. Per pt she is taking Tylenol XR sometimes and Motrin. Per pt she's been at the Neurologist last year.   Med refills

## 2018-06-22 NOTE — Telephone Encounter (Signed)
Patients call taken.  Patient identified by name and date of birth.  Patient called with a complaint of hed pressure on the frontal lobe.  Patient had a constant headache for 5 days prior to the pressure coming on yesterday.  Patient states she is having near syncopal episodes when sitting and lying down.  OTC Tylenol is ineffective.  Patient denies any syncopal episodes.  Patient endorses intermittent abdominal pain.  Patient denies any bowel or urinary issues.  Patient denies chest pain, shortness of breath, sore throat, congestion.  Patient made appointment for tomorrow.  Patient acknowledged understanding of advice.

## 2018-06-22 NOTE — Telephone Encounter (Signed)
Patient called stating she has head pressure and feels like she is going to "fall out". Please follow up.

## 2018-06-23 ENCOUNTER — Encounter: Payer: Self-pay | Admitting: Family Medicine

## 2018-06-23 ENCOUNTER — Emergency Department (HOSPITAL_BASED_OUTPATIENT_CLINIC_OR_DEPARTMENT_OTHER)
Admission: EM | Admit: 2018-06-23 | Discharge: 2018-06-23 | Disposition: A | Discharge status: Home / Self Care | Payer: Medicaid Other | Attending: Emergency Medicine | Admitting: Emergency Medicine

## 2018-06-23 ENCOUNTER — Emergency Department (HOSPITAL_COMMUNITY)
Admission: EM | Admit: 2018-06-23 | Discharge: 2018-06-23 | Disposition: A | Discharge status: Home / Self Care | Payer: Medicaid Other | Attending: Emergency Medicine | Admitting: Emergency Medicine

## 2018-06-23 ENCOUNTER — Encounter (HOSPITAL_BASED_OUTPATIENT_CLINIC_OR_DEPARTMENT_OTHER): Payer: Self-pay | Admitting: *Deleted

## 2018-06-23 ENCOUNTER — Other Ambulatory Visit: Payer: Self-pay

## 2018-06-23 ENCOUNTER — Ambulatory Visit: Payer: Medicaid Other | Attending: Internal Medicine | Admitting: Family Medicine

## 2018-06-23 DIAGNOSIS — I10 Essential (primary) hypertension: Secondary | ICD-10-CM | POA: Insufficient documentation

## 2018-06-23 DIAGNOSIS — R42 Dizziness and giddiness: Secondary | ICD-10-CM | POA: Insufficient documentation

## 2018-06-23 DIAGNOSIS — Z5321 Procedure and treatment not carried out due to patient leaving prior to being seen by health care provider: Secondary | ICD-10-CM | POA: Insufficient documentation

## 2018-06-23 DIAGNOSIS — J45909 Unspecified asthma, uncomplicated: Secondary | ICD-10-CM | POA: Insufficient documentation

## 2018-06-23 DIAGNOSIS — R51 Headache: Secondary | ICD-10-CM | POA: Insufficient documentation

## 2018-06-23 DIAGNOSIS — R519 Headache, unspecified: Secondary | ICD-10-CM

## 2018-06-23 DIAGNOSIS — Z79899 Other long term (current) drug therapy: Secondary | ICD-10-CM | POA: Insufficient documentation

## 2018-06-23 MED ORDER — CETIRIZINE HCL 10 MG PO TABS: 10.0000 mg | ORAL_TABLET | Freq: Every day | ORAL | 0 refills | Status: DC

## 2018-06-23 MED ORDER — DIPHENHYDRAMINE HCL 50 MG/ML IJ SOLN
12.5000 mg | Freq: Once | INTRAMUSCULAR | Status: AC
  Administered 2018-06-23: 12.5 mg via INTRAVENOUS
  Filled 2018-06-23: qty 1

## 2018-06-23 MED ORDER — SODIUM CHLORIDE 0.9 % IV BOLUS
500.0000 mL | Freq: Once | INTRAVENOUS | Status: AC
  Administered 2018-06-23: 500 mL via INTRAVENOUS

## 2018-06-23 MED ORDER — MECLIZINE HCL 25 MG PO TABS: 25.0000 mg | ORAL_TABLET | Freq: Three times a day (TID) | ORAL | 0 refills | Status: DC | PRN

## 2018-06-23 MED ORDER — KETOROLAC TROMETHAMINE 30 MG/ML IJ SOLN
30.0000 mg | Freq: Once | INTRAMUSCULAR | Status: AC
  Administered 2018-06-23: 30 mg via INTRAVENOUS
  Filled 2018-06-23: qty 1

## 2018-06-23 MED ORDER — METOCLOPRAMIDE HCL 5 MG/ML IJ SOLN
10.0000 mg | Freq: Once | INTRAMUSCULAR | Status: AC
  Administered 2018-06-23: 10 mg via INTRAVENOUS
  Filled 2018-06-23: qty 2

## 2018-06-23 NOTE — ED Provider Notes (Signed)
[facility] Provider Note   CSN: [id number] Arrival date & time: 06/23/18  1826    History   Chief Complaint Chief Complaint  Patient presents with  . Headache    HPI Diane Lloyd is a 28 y.o. female.     The history is provided by the patient and medical records. No language interpreter was used.  Headache Associated symptoms: dizziness   Associated symptoms: no numbness and no weakness    Diane Lloyd is a 28 y.o. female who presents to the Emergency Department complaining of headache for the last 5 days.  A few times a day, she will have episodes of dizziness.  She reports that this feels like the room is spinning around her.  Will happen randomly, not necessarily just with quick movements or standing. She reports history of similar headaches multiple times in the past, however typically they do not last this many days.  She did have a fever on Monday of 101.  She has had no fevers the remainder of the week and has been checking daily.  She denies any neck pain or pain with movement of her neck.  She had an ED visit with her primary care doctor today who prescribed her meclizine as needed for the dizziness and cetirizine, thinking that her sinuses could be contributing to her headaches.  She currently is not experiencing any dizziness.   Past Medical History:  Diagnosis Date  . Anemia    iron def  . Anxiety   . Asthma   . Hypertension   . Obesity, unspecified   . PCOS (polycystic ovarian syndrome)   . Positive ANA (antinuclear antibody) 05/05/2017   done by [facility] in [facility]; ANA 1:160, speckled pattern    Patient Active Problem List   Diagnosis Date Noted  . Anxiety 06/20/2018  . Vaginal candidiasis 12/18/2015  . Diarrhea 11/10/2015  . Gastroenteritis 09/24/2015  . Screen for STD (sexually transmitted disease) 09/21/2015  . Rash and nonspecific skin eruption 08/31/2015  . Dysmenorrhea 08/31/2015  . Allergic  rhinitis 05/17/2013  . GERD (gastroesophageal reflux disease) 03/20/2012  . Polycystic ovarian syndrome 12/26/2011  . Chest pain 11/01/2011  . Family history of cardiac disorder 02/17/2011  . Contraception management 12/30/2010  . Depression, major, single episode, mild ([place]) 09/23/2010  . Essential hypertension 01/27/2010  . ANEMIA, IRON DEFICIENCY 06/27/2009  . Obesity 03/10/2006  . ASTHMA, INTERMITTENT 03/10/2006  . ECZEMA, ATOPIC DERMATITIS 03/10/2006    Past Surgical History:  Procedure Laterality Date  . TONSILLECTOMY       OB History    Gravida  0   Para      Term      Preterm      AB      Living        SAB      TAB      Ectopic      Multiple      Live Births               Home Medications    Prior to Admission medications   Medication Sig Start Date End Date Taking? Authorizing Provider  acetaminophen (TYLENOL) 500 MG tablet Take 500 mg by mouth every 6 (six) hours as needed for headache.   Yes [provider]  atenolol (TENORMIN) 25 MG tablet Take 0.5 tablets (12.5 mg total) by mouth 2 (two) times daily. 04/17/18  Yes Fulp, Cammie, MD  lisinopril-hydrochlorothiazide (PRINZIDE,ZESTORETIC) 10-12.5 MG tablet  Take 1 tablet by mouth daily. To lower blood pressure 02/06/18  Yes Fulp, Cammie, MD  meclizine (ANTIVERT) 25 MG tablet Take 1 tablet (25 mg total) by mouth 3 (three) times daily as needed for dizziness. 06/23/18  Yes Fulp, Cammie, MD  omeprazole (PRILOSEC) 40 MG capsule Take 40 mg by mouth daily.   Yes [provider]  cetirizine (ZYRTEC) 10 MG tablet Take 1 tablet (10 mg total) by mouth at bedtime. To help with head pressure/congestion 06/23/18   Fulp, Cammie, MD  ferrous sulfate 325 (65 FE) MG tablet Take 1 tablet (325 mg total) by mouth daily with breakfast. TAKE WITH ORANGE JUICE Patient not taking: Reported on 06/15/2018 04/28/18   Street, [place], PA-C    Family History Family History  Problem Relation Age of Onset  .  Cancer Father [id number]       esophagus  . Asthma Father   . Esophageal cancer Father   . Hypertension Mother   . Lupus Mother   . Diabetes Mother   . Cancer Maternal Uncle 40       pancreatic cancer  . Cancer Paternal Grandmother 8060       esophageal  . Colon cancer Neg Hx   . Ulcerative colitis Neg Hx     Social History Social History   Tobacco Use  . Smoking status: Never Smoker  . Smokeless tobacco: Never Used  Substance Use Topics  . Alcohol use: Yes    Comment: occasional  . Drug use: Not Currently    Types: Marijuana     Allergies   Diflucan [fluconazole]   Review of Systems Review of Systems  Neurological: Positive for dizziness and headaches. Negative for syncope, weakness, light-headedness and numbness.  All other systems reviewed and are negative.    Physical Exam Updated Vital Signs BP 125/76   Pulse 75   Temp 98.3 F (36.8 C) (Oral)   Resp 20   Ht 5\' 11"  (1.803 m)   Wt 100.4 kg   LMP 05/19/2018   SpO2 100%   BMI 30.88 kg/m   Physical Exam Vitals signs and nursing note reviewed.  Constitutional:      General: She is not in acute distress.    Appearance: She is well-developed. She is not diaphoretic.  HENT:     Head: Normocephalic and atraumatic.  Eyes:     General: No scleral icterus.    Conjunctiva/sclera: Conjunctivae normal.     Pupils: Pupils are equal, round, and reactive to light.     Comments: No nystagmus   Neck:     Musculoskeletal: Normal range of motion and neck supple.     Comments: Full active and passive ROM without pain.  No midline or paraspinal tenderness. No nuchal rigidity or meningeal signs. Cardiovascular:     Rate and Rhythm: Normal rate and regular rhythm.     Heart sounds: Normal heart sounds.  Pulmonary:     Effort: Pulmonary effort is normal. No respiratory distress.     Breath sounds: Normal breath sounds. No wheezing or rales.  Abdominal:     General: Bowel sounds are normal. There is no distension.      Palpations: Abdomen is soft.     Tenderness: There is no abdominal tenderness. There is no guarding or rebound.  Musculoskeletal: Normal range of motion.  Lymphadenopathy:     Cervical: No cervical adenopathy.  Skin:    General: Skin is warm and dry.     Findings: No rash.  Neurological:  Mental Status: She is alert and oriented to person, place, and time.     Cranial Nerves: No cranial nerve deficit.     Coordination: Coordination normal.     Deep Tendon Reflexes: Reflexes are normal and symmetric.     Comments: Alert, oriented, thought content appropriate, able to give a coherent history. Speech is clear and goal oriented, able to follow commands.  Cranial Nerves:  II:  Peripheral visual fields grossly normal, pupils equal, round, reactive to light III, IV, VI: EOM intact bilaterally, ptosis not present V,VII: smile symmetric, eyes kept closed tightly against resistance, facial light touch sensation equal VIII: hearing grossly normal IX, X: symmetric soft palate movement, uvula elevates symmetrically  XI: bilateral shoulder shrug symmetric and strong XII: midline tongue extension 5/5 muscle strength in upper and lower extremities bilaterally including strong and equal grip strength and dorsiflexion/plantar flexion Sensory to light touch normal in all four extremities.  Normal finger-to-nose and rapid alternating movements. No drift. Steady gait.      ED Treatments / Results  Labs (all labs ordered are listed, but only abnormal results are displayed) Labs Reviewed - No data to display  EKG None  Radiology No results found.  Procedures Procedures (including critical care time)  Medications Ordered in ED Medications  ketorolac (TORADOL) 30 MG/ML injection 30 mg (30 mg Intravenous Given 06/23/18 1946)  sodium chloride 0.9 % bolus 500 mL ( Intravenous Stopped 06/23/18 2045)  metoCLOPramide (REGLAN) injection 10 mg (10 mg Intravenous Given 06/23/18 1948)  diphenhydrAMINE  (BENADRYL) injection 12.5 mg (12.5 mg Intravenous Given 06/23/18 1943)     Initial Impression / Assessment and Plan / ED Course  I have reviewed the triage vital signs and the nursing notes.  Pertinent labs & imaging results that were available during my care of the patient were reviewed by me and considered in my medical decision making (see chart for details).       Lanier ClamDeneshia D Lloyd is a 28 y.o. female who presents to ED for headache c/w their typical migraine, however lasting longer than usual. No focal neuro deficits on exam. Migraine cocktail and fluids given.   On re-evaluation, patient feels much improved, symptoms resolved. Ambulated with steady gait. Asymptomatic with this as well. She does report intermittent dizziness. She states she has had this with her previous headaches as well. Has had MRI of brain in the past which was normal as well. The patient denies any other neurologic symptoms such as visual changes, focal numbness/weakness,  confusion, or speech difficulty to suggest a life-threatening intracranial process such as intracranial hemorrhage or mass. The patient has no clotting risk factors thus venous sinus thrombosis is unlikely. No fevers, neck pain or nuchal rigidity to suggest meningitis. I feel that the patient is safe for discharge home at this time given resolution of symptoms and history of similar. Did speak with her at length about return precautions.  Neuro or PCP follow up strongly encouraged. All questions answered.  Final Clinical Impressions(s) / ED Diagnoses   Final diagnoses:  Bad headache    ED Discharge Orders    None       Laikynn Pollio, Chase PicketJaime Pilcher, PA-C 06/23/18 2048    Jacalyn LefevreHaviland, Julie, MD 06/23/18 2137

## 2018-06-23 NOTE — ED Notes (Signed)
Pt noted to have an even steady gait during ambulation.

## 2018-06-23 NOTE — ED Triage Notes (Signed)
Per pt she has been having headache and congestion for about 5 days. Head pressure with burning. Pt has been taking tylenol and a prescription from dr for congestion with no relief.Some dizziness.

## 2018-06-23 NOTE — Discharge Instructions (Signed)
It was my pleasure taking care of you today!  Please follow up with your doctor.   Fortunately, your evaluation today is reassuring with no apparent emergent cause for your headache at this time. With that being said, it is VERY important that you monitor your symptoms at home. If you develop worsening headache, new fever, new neck stiffness, rash, weakness, numbness, trouble with your speech, trouble walking, new or worsening symptoms or any concerning symptoms, please return to the ED immediately.

## 2018-06-23 NOTE — ED Triage Notes (Signed)
Headache and dizziness x 5 days. She went to [facility] but left before being seen due to wait time. She also had a virtual visit today.

## 2018-06-23 NOTE — Progress Notes (Signed)
New Patient Office Visit  Subjective:  Patient ID: Diane Lloyd, female    DOB: Mar 17, 1990  Age: 28 y.o. MRN: [id number]  CC:  Chief Complaint  Patient presents with  . Headache  . Abdominal Pain   Patient's visit was conducted by phone due to her recent onset of fever which could be a symptom of COVID-19.  Limitations of non-face-to-face visit were discussed with the patient and she agreed to proceed with today's visit.  Patient location: Home Physician location: Office Others participating in call: Guillermina City[place]avia Richard, RMA  HPI Diane Lloyd presents for complaint of 4 days of frontal headache/head pressure as well as dizziness.  Patient also reports that on Wednesday she had a temperature of 101.4 but had a negative COVID test.  She reports that she was tested for COVID on Tuesday prior to onset of fever.  She has not had a temperature greater than 98.8 today but did have temperature of 99.4 yesterday.  She denies any shortness of breath or cough, no nasal congestion or postnasal drainage.  She denies any current abdominal pain-no nausea or vomiting or diarrhea.  She does have some fatigue.  Patient states that her current headache has been as high as a 8 on a 0-to-10 scale.  Headache consist of dull/aching and pressure sensation.  When patient has dizziness she feels as if the room is spinning and this usually occurs when she is sitting down.  When she stands up the dizziness feels more so as if she is slightly off balance.  She has had no focal numbness or weakness, no slurred speech, no numbness or tingling.  She has had no urinary frequency, urgency or dysuria.  She does not have any symptoms which she feels could indicate urinary tract infection which she has had in the past.  Patient complains that occasionally she feels as if she has some mild blurred vision which has been occurring since she had onset of her headache.  Patient otherwise denies any visual disturbance.  Past  Medical History:  Diagnosis Date  . Anemia    iron def  . Anxiety   . Asthma   . Hypertension   . Obesity, unspecified   . PCOS (polycystic ovarian syndrome)   . Positive ANA (antinuclear antibody) 05/05/2017   [facility]; ANA 1:160, speckled pattern    Past Surgical History:  Procedure Laterality Date  . TONSILLECTOMY      Family History  Problem Relation Age of Onset  . Cancer Father 352       esophagus  . Asthma Father   . Esophageal cancer Father   . Hypertension Mother   . Lupus Mother   . Diabetes Mother   . Cancer Maternal Uncle 40       pancreatic cancer  . Cancer Paternal Grandmother [id number]       esophageal  . Colon cancer Neg Hx   . Ulcerative colitis Neg Hx     Social History   Socioeconomic History  . Marital status: Single    Spouse name: Not on file  . Number of children: 0  . Years of education: Not on file  . Highest education level: Some college, no degree  Occupational History  . Occupation: CSR    Employer: [employer]  Social Needs  . Financial resource strain: Not on file  . Food insecurity    Worry: Not on file    Inability: Not on file  . Transportation  needs    Medical: Not on file    Non-medical: Not on file  Tobacco Use  . Smoking status: Never Smoker  . Smokeless tobacco: Never Used  Substance and Sexual Activity  . Alcohol use: Yes    Comment: occasional  . Drug use: Not Currently    Types: Marijuana  . Sexual activity: Yes    Birth control/protection: None  Lifestyle  . Physical activity    Days per week: Not on file    Minutes per session: Not on file  . Stress: Not on file  Relationships  . Social Musicianconnections    Talks on phone: Not on file    Gets together: Not on file    Attends religious service: Not on file    Active member of club or organization: Not on file    Attends meetings of clubs or organizations: Not on file    Relationship status: Not on file  . Intimate partner violence     Fear of current or ex partner: Not on file    Emotionally abused: Not on file    Physically abused: Not on file    Forced sexual activity: Not on file  Other Topics Concern  . Not on file  Social History Narrative   Patient is right-handed. She lives with her mother and uncle in a one level home. She occasionally drinks caffeine. She does not exercise.    ROS Review of Systems  Constitutional: Positive for fatigue and fever. Negative for chills.  HENT: Negative for ear pain, nosebleeds, postnasal drip, rhinorrhea, sinus pressure, sinus pain, sore throat and trouble swallowing.   Eyes: Positive for visual disturbance. Negative for photophobia.  Respiratory: Negative for cough and shortness of breath.   Cardiovascular: Negative for chest pain, palpitations and leg swelling.  Gastrointestinal: Negative for abdominal pain, constipation, diarrhea and nausea.  Endocrine: Negative for polydipsia, polyphagia and polyuria.  Genitourinary: Negative for dysuria and frequency.  Musculoskeletal: Negative for back pain and gait problem.  Neurological: Positive for dizziness and headaches. Negative for tremors, syncope, speech difficulty and numbness.  Psychiatric/Behavioral: Negative for self-injury and suicidal ideas. The patient is nervous/anxious.     Objective:   Today's Vitals: LMP 05/19/2018 (Exact Date)   Physical Exam no vital signs or physical examination done as visit was conducted by phone  Assessment & Plan:  1. Acute nonintractable headache, unspecified headache type Patient with complaint of recent onset of headache at the front/top of the head.  She denies any symptoms of nasal congestion/postnasal drainage.  She has also had recent fever but she reports negative COVID-19 testing though patient did seem somewhat confused regarding when she exactly had the test in relation to when she has symptoms.  She reports that she decided to have COVID testing because she recently traveled [place]  of [place] to [place].  She reports onset of fever 1 day after COVID testing.  Prescription will be sent to patient's pharmacy for cetirizine in case allergic rhinitis is contributing to her headache/head pressure.  Discussed with the patient that without seeing her in person I could not further diagnose the cause of her headaches or dizziness.  She may wish to check her blood pressure to make sure that this is stable.  Patient was evaluated by another physician/family medicine office on 06/20/2018 with whom she established care however patient states that due to lack of insurance coverage she returned to this office.  Also discussed with the patient that she did not have any  symptoms that would warrant antibiotic treatment as her recent fever, headache and dizziness would more likely be of viral etiology and an antibiotic would not be effective.  Patient was advised to go to the emergency room this weekend if she has worsening of her headache, continued fever or any concerns.  Patient denies any neck stiffness and does not currently have a fever.  Patient was encouraged to try over-the-counter Tylenol or Tylenol migraine to see if this helps with her headache - cetirizine (ZYRTEC) 10 MG tablet; Take 1 tablet (10 mg total) by mouth at bedtime. To help with head pressure/congestion  Dispense: 30 tablet; Refill: 0  2. Dizziness Patient with complaint of dizziness.  Prescription will be sent to patient's pharmacy for meclizine to take as needed.  Prescription also for Zyrtec to help with head pressure which may be related to allergic rhinitis.  Discussed with the patient that I could not further diagnose her issues with that and in person visit but she was encouraged to go to the emergency department for further evaluation this weekend if she has continued or worsening dizziness, continued or worsening headache or any concerns. - meclizine (ANTIVERT) 25 MG tablet; Take 1 tablet (25 mg total) by mouth 3 (three) times  daily as needed for dizziness.  Dispense: 30 tablet; Refill: 0 - cetirizine (ZYRTEC) 10 MG tablet; Take 1 tablet (10 mg total) by mouth at bedtime. To help with head pressure/congestion  Dispense: 30 tablet; Refill: 0   Outpatient Encounter Medications as of 06/23/2018  Medication Sig  . acetaminophen (TYLENOL) 500 MG tablet Take 500 mg by mouth every 6 (six) hours as needed for headache.  Marland Kitchen atenolol (TENORMIN) 25 MG tablet Take 0.5 tablets (12.5 mg total) by mouth 2 (two) times daily.  Marland Kitchen lisinopril-hydrochlorothiazide (PRINZIDE,ZESTORETIC) 10-12.5 MG tablet Take 1 tablet by mouth daily. To lower blood pressure  . omeprazole (PRILOSEC) 40 MG capsule Take 40 mg by mouth daily.  . ferrous sulfate 325 (65 FE) MG tablet Take 1 tablet (325 mg total) by mouth daily with breakfast. TAKE WITH ORANGE JUICE (Patient not taking: Reported on 06/15/2018)   No facility-administered encounter medications on file as of 06/23/2018.     Follow-up: Return in 1 to 2 weeks if not improving, emergency department this weekend if symptoms worsen acutely or any concerns   Antony Blackbird, MD

## 2018-06-24 NOTE — Progress Notes (Signed)
Virtual Visit via Telephone Note  I connected with Diane Lloyd on 06/24/18 at  3:30 PM EDT by telephone and verified that I am speaking with the correct person using two identifiers.   I discussed the limitations, risks, security and privacy concerns of performing an evaluation and management service by telephone and the availability of in person appointments. I also discussed with the patient that there may be a patient responsible charge related to this service. The patient expressed understanding and agreed to proceed.   History of Present Illness: Diane Lloyd is having a telemetry visit for GI complaints of headache and abdominal pain for the last 4 days unknown etiology.  She denies eating or drinking from out of her norm.  Explained to patient she could not have to PCPs she was seen the day before with Dr. Jerilee Hoh for the same problem.  Advised her to select 1 PCP and be followed by them.   Observations/Objective: Review of Systems  Constitutional: Negative.   Eyes: Negative.   Respiratory: Negative.   Cardiovascular: Negative.   Gastrointestinal: Positive for abdominal pain, nausea and vomiting.  Genitourinary: Negative.   Musculoskeletal: Negative.   Skin: Negative.     Assessment and Plan: Diane Lloyd was seen today for headache and abdominal pain.  Diagnoses and all orders for this visit:  Epigastric pain Advised patient to stay hydrated with water or Gatorade try a brat diet bananas rice applesauce and toast until signs and symptoms are relieved.  If she continues to have nausea and vomiting and unable to maintain fluids or soft foods advised to proceed to urgent care or emergency room.   Follow Up Instructions:    I discussed the assessment and treatment plan with the patient. The patient was provided an opportunity to ask questions and all were answered. The patient agreed with the plan and demonstrated an unerstanding of the instructions.   The patient was advised  to all back or seek an in-person evaluation if the symptoms worsen or if the condition fails to improve as anticipated.  I provided 12 minutes of non-face-to-face time during this encounter.  This includes reading reviewing notes from Dr. Jerilee Hoh on the previous day and other encounters   Kerin Perna, NP

## 2018-06-26 ENCOUNTER — Other Ambulatory Visit: Payer: Self-pay

## 2018-06-27 ENCOUNTER — Encounter: Payer: Self-pay | Admitting: Cardiology

## 2018-06-27 ENCOUNTER — Ambulatory Visit (INDEPENDENT_AMBULATORY_CARE_PROVIDER_SITE_OTHER): Payer: Self-pay | Admitting: Psychiatry

## 2018-06-27 ENCOUNTER — Encounter (HOSPITAL_COMMUNITY): Payer: Self-pay | Admitting: Psychiatry

## 2018-06-27 ENCOUNTER — Other Ambulatory Visit: Payer: Self-pay

## 2018-06-27 DIAGNOSIS — F410 Panic disorder [episodic paroxysmal anxiety] without agoraphobia: Secondary | ICD-10-CM

## 2018-06-27 DIAGNOSIS — F411 Generalized anxiety disorder: Secondary | ICD-10-CM

## 2018-06-27 MED ORDER — TRAZODONE HCL 50 MG PO TABS: 50.0000 mg | ORAL_TABLET | Freq: Every evening | ORAL | 0 refills | Status: DC | PRN

## 2018-06-27 MED ORDER — PAROXETINE HCL 20 MG PO TABS: 20.0000 mg | ORAL_TABLET | Freq: Every day | ORAL | 0 refills | Status: DC

## 2018-06-27 NOTE — Telephone Encounter (Signed)
New Message   Patient would like a nurse to call her to go over heart monitor results she received on my chart.

## 2018-06-27 NOTE — Telephone Encounter (Signed)
See result encounter
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
## 2018-06-29 ENCOUNTER — Telehealth: Payer: Self-pay

## 2018-06-29 MED ORDER — ATENOLOL 25 MG PO TABS: 25.0000 mg | ORAL_TABLET | Freq: Two times a day (BID) | ORAL | 3 refills | Status: DC

## 2018-06-29 NOTE — Telephone Encounter (Signed)
-----   Message from Sueanne Margarita, MD sent at 06/27/2018  2:24 PM EDT ----- Increase Atenolol to 25mg  BID and followup with PA in 4 weeks  Traci ----- Message ----- From: Sarina Ill, RN Sent: 06/27/2018  12:57 PM EDT To: Sueanne Margarita, MD  The patient is still having some palpitations now and then, she wanted to try some medication. She is currently on atenolol.

## 2018-06-29 NOTE — Telephone Encounter (Signed)
Spoke with the patient on 06/28/18. She expressed understanding and had no further questions.

## 2018-06-30 ENCOUNTER — Inpatient Hospital Stay: Payer: Medicaid Other | Admitting: Family Medicine

## 2018-06-30 ENCOUNTER — Encounter: Payer: Self-pay | Admitting: Primary Care

## 2018-06-30 ENCOUNTER — Ambulatory Visit: Payer: Self-pay | Attending: Family Medicine | Admitting: Family Medicine

## 2018-06-30 DIAGNOSIS — N898 Other specified noninflammatory disorders of vagina: Secondary | ICD-10-CM

## 2018-06-30 DIAGNOSIS — R51 Headache: Secondary | ICD-10-CM

## 2018-06-30 DIAGNOSIS — R519 Headache, unspecified: Secondary | ICD-10-CM

## 2018-06-30 MED ORDER — METRONIDAZOLE 500 MG PO TABS: 500.0000 mg | ORAL_TABLET | Freq: Two times a day (BID) | ORAL | 0 refills | Status: DC

## 2018-06-30 NOTE — Progress Notes (Signed)
Virtual Visit via Video Note  I connected with Diane Lloyd on 06/30/18 at  2:10 PM EDT by a video enabled telemedicine application and verified that I am speaking with the correct person using two identifiers.  Location: Patient: passenger in car Provider:Office Others participating in the call: Emilio Aspen, RMA who initiated call to the patient so that patient would join the [place] video visit   I discussed the limitations of evaluation and management by telemedicine and the availability of in person appointments. The patient expressed understanding and agreed to proceed.  History of Present Illness:      28 yo female who is status post recent tele-health visit followed by ED visit due to continued complaint of recent onset of top of the head headache.  Patient reports that she continues to have a headache/painful area in the mid frontal top of the head for approximately 2 weeks.  She states that sometimes the area hurts when she touches it.  She reports that she recently went to the emergency department after her telehealth visit due to her continued headache.  She reports that she was given a migraine cocktail in the emergency department which did help improve her headache but the next day the headache returned and she has had daily headache.  At today's visit she also complains of occasional visual disturbance during the headache.  States sometimes feels as if she has slight blurring of vision in the right eye during a headache.  She states that her headache pain is about an 8 on a 0-to-10 scale and her headache pain is sharp.  She has had some mild dizziness associated with the onset of the headaches.  She reports that she has not had headaches that were the same as her current headache in the past.  She denies any slurred speech, no focal numbness or weakness.  She reports that she feels tired because of her headache.      Patient also reports that she believes that she has recurrence of  BV due to the presence of some clear to light white vaginal discharge with a slight odor.  Patient would like treatment.   Observations/Objective: No vital signs or physical examination was done at today's visit as it was contacted by video. General-patient did not appear to be in any acute distress.  She had no slurred speech, extraocular movements appeared intact and normal conjunctiva on video visualization.  She did not have any evidence of facial weakness.  She did not appear acutely ill.  Assessment and Plan: 1. Vaginal discharge Prescription will be sent to patient's pharmacy for metronidazole 500 mg twice daily x7 days and if vaginal discharge does not resolve, patient will need in person visit for pelvic exam  2. Acute nonintractable headache, unspecified headache type Patient with continued complaint of headache and patient has had recurrent medical visits within the past 2 weeks due to her complaint of headache.  Patient will be scheduled for head CT but is aware that she should return to the emergency department if she has worsening of her headache, continued changes in vision, slurred speech or focal weakness or any other concerns. - CT Head Wo Contrast; Future  Follow Up Instructions:Return in about 2 weeks (around 07/14/2018) for Headaches-1 to 2-week follow-up, ED if symptoms worsen.    I discussed the assessment and treatment plan with the patient. The patient was provided an opportunity to ask questions and all were answered. The patient agreed with the plan and demonstrated  an understanding of the instructions.   The patient was advised to call back or seek an in-person evaluation if the symptoms worsen or if the condition fails to improve as anticipated.  I provided 7  minutes of non-face-to-face time during this encounter.   Cain Saupeammie Khayla Koppenhaver, MD

## 2018-07-04 ENCOUNTER — Encounter: Payer: Self-pay | Admitting: Family Medicine

## 2018-07-05 ENCOUNTER — Telehealth: Payer: Self-pay | Admitting: *Deleted

## 2018-07-05 NOTE — Telephone Encounter (Signed)
-----   Message from Cammie Fulp, MD sent at 07/04/2018 11:18 PM EDT ----- Regarding: Schedule head CT See most recent visit note.  Patient needs to be scheduled for head CT in follow-up of headaches  

## 2018-07-05 NOTE — Telephone Encounter (Signed)
Spoke with patient and informed her with what provider stated and patient verbalized understanding. Staff called patient insurance to get prior auth for patient and was told that prior auth was not needed for procedure requesting. Called Central Scheduling and spoke with Ginger and an appt for pt CT of the Head w/o contrast was made for June 30 at 4:30 arriving at 4:15pm at the Radiology department at [facility]. Staff called patient and informed her with information and she verbalized understanding.

## 2018-07-05 NOTE — Telephone Encounter (Signed)
Staff called [employer] to complete Prior Auth for patient CT of the Head without Contrast. Per Tedra Senegal., this patient insurance plan do not require a Prior Auth for the type of service patient is needing to be completed.

## 2018-07-05 NOTE — Telephone Encounter (Signed)
Called patient to inform her with what provider stated due to patient recent call about her headache. Staff unable to speak with patient due to patient voicemail box being full.

## 2018-07-05 NOTE — Telephone Encounter (Signed)
-----   Message from Antony Blackbird, MD sent at 07/04/2018 11:18 PM EDT ----- Regarding: Schedule head CT See most recent visit note.  Patient needs to be scheduled for head CT in follow-up of headaches

## 2018-07-11 ENCOUNTER — Other Ambulatory Visit: Payer: Self-pay

## 2018-07-11 ENCOUNTER — Ambulatory Visit (HOSPITAL_COMMUNITY)
Admission: RE | Admit: 2018-07-11 | Discharge: 2018-07-11 | Disposition: A | Discharge status: Home / Self Care | Payer: Medicaid Other | Source: Ambulatory Visit | Attending: Family Medicine | Admitting: Family Medicine

## 2018-07-11 DIAGNOSIS — R51 Headache: Secondary | ICD-10-CM | POA: Insufficient documentation

## 2018-07-11 DIAGNOSIS — R519 Headache, unspecified: Secondary | ICD-10-CM

## 2018-07-16 ENCOUNTER — Encounter (HOSPITAL_COMMUNITY): Payer: Self-pay

## 2018-07-16 ENCOUNTER — Ambulatory Visit (HOSPITAL_COMMUNITY)
Admission: EM | Admit: 2018-07-16 | Discharge: 2018-07-16 | Disposition: A | Discharge status: Home / Self Care | Payer: Medicaid Other | Attending: Urgent Care | Admitting: Urgent Care

## 2018-07-16 ENCOUNTER — Other Ambulatory Visit: Payer: Self-pay

## 2018-07-16 ENCOUNTER — Telehealth: Payer: Self-pay

## 2018-07-16 DIAGNOSIS — Z20822 Contact with and (suspected) exposure to covid-19: Secondary | ICD-10-CM

## 2018-07-16 DIAGNOSIS — R0982 Postnasal drip: Secondary | ICD-10-CM | POA: Insufficient documentation

## 2018-07-16 DIAGNOSIS — R059 Cough, unspecified: Secondary | ICD-10-CM

## 2018-07-16 DIAGNOSIS — R05 Cough: Secondary | ICD-10-CM | POA: Insufficient documentation

## 2018-07-16 DIAGNOSIS — J029 Acute pharyngitis, unspecified: Secondary | ICD-10-CM | POA: Insufficient documentation

## 2018-07-16 DIAGNOSIS — B349 Viral infection, unspecified: Secondary | ICD-10-CM | POA: Insufficient documentation

## 2018-07-16 LAB — POCT RAPID STREP A: Streptococcus, Group A Screen (Direct): NEGATIVE

## 2018-07-16 MED ORDER — BENZONATATE 100 MG PO CAPS: 100.0000 mg | ORAL_CAPSULE | Freq: Three times a day (TID) | ORAL | 0 refills | Status: DC | PRN

## 2018-07-16 NOTE — Telephone Encounter (Signed)
Called pt and Covid 19 testing scheduled for 07/17/18 at 2:45 pm at [facility] Pt advised of address, to stay in car for testing and to wear mask to testing site. Pt verbalized understanding.

## 2018-07-16 NOTE — ED Provider Notes (Signed)
MRN: [id number] DOB: [birth year]  Subjective:   Diane Lloyd is a 28 y.o. female presenting for 3-day history of mild to moderate malaise, sinus congestion, postnasal drainage, throat pain, productive cough. Has tried otc cough medications, hot tea, salt water gargles.   No current facility-administered medications for this encounter.   Current Outpatient Medications:  .  acetaminophen (TYLENOL) 500 MG tablet, Take 500 mg by mouth every 6 (six) hours as needed for headache., Disp: , Rfl:  .  atenolol (TENORMIN) 25 MG tablet, Take 1 tablet (25 mg total) by mouth 2 (two) times daily., Disp: 180 tablet, Rfl: 3 .  lisinopril-hydrochlorothiazide (PRINZIDE,ZESTORETIC) 10-12.5 MG tablet, Take 1 tablet by mouth daily. To lower blood pressure, Disp: 90 tablet, Rfl: 1 .  omeprazole (PRILOSEC) 40 MG capsule, Take 40 mg by mouth daily., Disp: , Rfl:  .  PARoxetine (PAXIL) 20 MG tablet, Take 1 tablet (20 mg total) by mouth daily for 30 days., Disp: 30 tablet, Rfl: 0 .  traZODone (DESYREL) 50 MG tablet, Take 1 tablet (50 mg total) by mouth at bedtime as needed for up to 30 days for sleep., Disp: 30 tablet, Rfl: 0    Allergies  Allergen Reactions  . Diflucan [Fluconazole] Itching and Rash    Past Medical History:  Diagnosis Date  . Anemia    iron def  . Anxiety   . Asthma   . Hypertension   . Obesity, unspecified   . PCOS (polycystic ovarian syndrome)   . Positive ANA (antinuclear antibody) 05/05/2017   [facility]; ANA 1:160, speckled pattern     Past Surgical History:  Procedure Laterality Date  . TONSILLECTOMY      Review of Systems  Constitutional: Negative for fever and malaise/fatigue.  HENT: Positive for congestion and sore throat. Negative for ear pain and sinus pain.   Eyes: Negative for blurred vision, double vision, discharge and redness.  Respiratory: Positive for cough. Negative for hemoptysis, shortness of breath and wheezing.    Cardiovascular: Negative for chest pain.  Gastrointestinal: Negative for abdominal pain, diarrhea, nausea and vomiting.  Genitourinary: Negative for dysuria, flank pain and hematuria.  Musculoskeletal: Negative for myalgias.  Skin: Negative for rash.  Neurological: Negative for dizziness, weakness and headaches.  Psychiatric/Behavioral: Negative for depression and substance abuse.    Objective:   Vitals: BP 126/70 (BP Location: Left Arm)   Pulse 67   Temp 98.9 F (37.2 C) (Oral)   Resp 16   SpO2 98%   Physical Exam Constitutional:      General: She is not in acute distress.    Appearance: Normal appearance. She is well-developed. She is not ill-appearing, toxic-appearing or diaphoretic.  HENT:     Head: Normocephalic and atraumatic.     Right Ear: Tympanic membrane and ear canal normal. No drainage or tenderness. No middle ear effusion. Tympanic membrane is not erythematous.     Left Ear: Tympanic membrane and ear canal normal. No drainage or tenderness.  No middle ear effusion. Tympanic membrane is not erythematous.     Nose: Nose normal. No congestion or rhinorrhea.     Mouth/Throat:     Mouth: Mucous membranes are moist. No oral lesions.     Pharynx: Oropharynx is clear. No pharyngeal swelling, oropharyngeal exudate, posterior oropharyngeal erythema or uvula swelling.     Tonsils: No tonsillar exudate or tonsillar abscesses.  Eyes:     Extraocular Movements: Extraocular movements intact.     Right eye: Normal  extraocular motion.     Left eye: Normal extraocular motion.     Conjunctiva/sclera: Conjunctivae normal.     Pupils: Pupils are equal, round, and reactive to light.  Neck:     Musculoskeletal: Normal range of motion and neck supple.  Cardiovascular:     Rate and Rhythm: Normal rate and regular rhythm.     Pulses: Normal pulses.     Heart sounds: Normal heart sounds. No murmur. No friction rub. No gallop.   Pulmonary:     Effort: Pulmonary effort is normal. No  respiratory distress.     Breath sounds: Normal breath sounds. No stridor. No wheezing, rhonchi or rales.  Lymphadenopathy:     Cervical: No cervical adenopathy.  Skin:    General: Skin is warm and dry.     Findings: No rash.  Neurological:     General: No focal deficit present.     Mental Status: She is alert and oriented to person, place, and time.  Psychiatric:        Mood and Affect: Mood normal.        Behavior: Behavior normal.        Thought Content: Thought content normal.     Results for orders placed or performed during the hospital encounter of 07/16/18 (from the past 24 hour(s))  POCT rapid strep A [facility])     Status: None   Collection Time: 07/16/18 11:58 AM  Result Value Ref Range   Streptococcus, Group A Screen (Direct) NEGATIVE NEGATIVE    Assessment and Plan :   1. Viral syndrome   2. Sore throat   3. Post-nasal drainage   4. Cough    Counseled patient on nature of COVID-19 including modes of transmission, diagnostic testing, management and supportive care. Testing pending. Advised supportive care, offered symptomatic relief. Counseled patient on potential for adverse effects with medications prescribed/recommended today, ER and return-to-clinic precautions discussed, patient verbalized understanding.     Jaynee Eagles, [place] 07/16/18 1202

## 2018-07-16 NOTE — ED Triage Notes (Signed)
Patient presents to Urgent Care with complaints of intermittent sore throat since 3 days ago and nasal congestion since yesterday. Patient reports she has not had a fever, cough started yesterday as well.

## 2018-07-16 NOTE — Discharge Instructions (Addendum)
We will manage this as a viral syndrome. For sore throat or cough try using a honey-based tea. Use 3 teaspoons of honey with juice squeezed from half lemon. Place shaved pieces of ginger into 1/2-1 cup of water and warm over stove top. Then mix the ingredients and repeat every 4 hours as needed. Please take Tylenol 500mg every 6 hours. Hydrate very well with at least 2 liters of water. Eat light meals such as soups to replenish electrolytes and soft fruits, veggies. Start an antihistamine like Zyrtec, Allegra or Claritin for postnasal drainage, sinus congestion.  You can take this together with pseudoephedrine (Sudafed) at a dose of 60 mg 3 times a day oral 120 mg twice daily as needed for the same kind of congestion.  However, do not take Sudafed if you have high blood pressure or are prone to palpitations, have abnormal heart rhythms. ° °

## 2018-07-16 NOTE — Telephone Encounter (Signed)
-----   Message from Jaynee Eagles, [place] sent at 07/16/2018 11:27 AM EDT ----- Regarding: COVID testing needed

## 2018-07-17 ENCOUNTER — Other Ambulatory Visit: Payer: Self-pay

## 2018-07-17 DIAGNOSIS — Z20822 Contact with and (suspected) exposure to covid-19: Secondary | ICD-10-CM

## 2018-07-18 LAB — CULTURE, GROUP A STREP (THRC)

## 2018-07-19 ENCOUNTER — Telehealth: Payer: Self-pay | Admitting: *Deleted

## 2018-07-19 NOTE — Telephone Encounter (Signed)
Left message on machine for patient to schedule her CPE in 3 months

## 2018-07-22 LAB — NOVEL CORONAVIRUS, NAA: SARS-CoV-2, NAA: NOT DETECTED

## 2018-07-25 ENCOUNTER — Telehealth: Payer: Self-pay | Admitting: Physician Assistant

## 2018-07-25 NOTE — Progress Notes (Signed)
Cardiology Office Note    Date:  07/26/2018   ID:  Diane ClamDeneshia D Gloor, DOB [birth year], MRN [id number]  PCP:  Cain SaupeFulp, Cammie, MD  Cardiologist: Armanda Magicraci Turner, MD EPS: None  Chief Complaint  Patient presents with  . Follow-up    History of Present Illness:  Diane Lloyd is a 28 y.o. female with history of HTN, obesity, asthma, PCOS, and chronic anemia. Patient say Dr. Mayford Knifeurner 03/20/18 after ER visit for palpitations and chest pain. EKG's have been normal. Echo normal 2013. Echo 05/17/18 normal LVEF no valvular abn. holter monitor 06/26/18 average HR 77 but range 45-178 with rare PVC's. Patient was still having symptoms and atenolol was increased to 25 mg BID. GXT was ordered for atypical chest pain hasn't been done yet.   Patient never increased her atenolol to BID. She has changed her diet and exercising-walking 3 miles daily. Much improvement in her palpitations and chest pain. She had been drinking 4-5 sodas and tea daily which she cut out. 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     Past Medical History:  Diagnosis Date  . Anemia    iron def  . Anxiety   . Asthma   . Hypertension   . Obesity, unspecified   . PCOS (polycystic ovarian syndrome)   . Positive ANA (antinuclear antibody) 05/05/2017   done [facility]; ANA 1:160, speckled pattern    Past Surgical History:  Procedure Laterality Date  . TONSILLECTOMY      Current Medications: Current Meds  Medication Sig  . acetaminophen (TYLENOL) 500 MG tablet Take 500 mg by mouth every 6 (six) hours as needed for headache.  Marland Kitchen. atenolol (TENORMIN) 25 MG tablet Take 1 tablet (25 mg total) by mouth daily. You may take an extra tablet as needed for palpitations  . benzonatate (TESSALON) 100 MG capsule Take 1-2 capsules (100-200 mg total) by mouth 3 (three) times daily as needed.  Marland Kitchen. lisinopril-hydrochlorothiazide (ZESTORETIC) 10-12.5 MG tablet Take 1 tablet by mouth daily. To lower blood pressure  . omeprazole  (PRILOSEC) 40 MG capsule Take 40 mg by mouth daily.  Marland Kitchen. PARoxetine (PAXIL) 20 MG tablet Take 1 tablet (20 mg total) by mouth daily for 30 days.  . traZODone (DESYREL) 50 MG tablet Take 1 tablet (50 mg total) by mouth at bedtime as needed for up to 30 days for sleep.  . [DISCONTINUED] atenolol (TENORMIN) 25 MG tablet Take 1 tablet (25 mg total) by mouth 2 (two) times daily. (Patient taking differently: Take 25 mg by mouth daily. )  . [DISCONTINUED] lisinopril-hydrochlorothiazide (PRINZIDE,ZESTORETIC) 10-12.5 MG tablet Take 1 tablet by mouth daily. To lower blood pressure     Allergies:   Diflucan [fluconazole]   Social History   Socioeconomic History  . Marital status: Single    Spouse name: Not on file  . Number of children: 0  . Years of education: Not on file  . Highest education level: Some college, no degree  Occupational History  . Occupation: CSR    Employer: [employer]  Social Needs  . Financial resource strain: Not on file  . Food insecurity    Worry: Not on file    Inability: Not on file  . Transportation needs    Medical: Not on file    Non-medical: Not on file  Tobacco Use  . Smoking status: Never Smoker  . Smokeless tobacco: Never Used  Substance and Sexual Activity  . Alcohol use: Yes    Comment: occasional  .  Drug use: Not Currently    Types: Marijuana  . Sexual activity: Yes    Birth control/protection: None  Lifestyle  . Physical activity    Days per week: Not on file    Minutes per session: Not on file  . Stress: Not on file  Relationships  . Social Musicianconnections    Talks on phone: Not on file    Gets together: Not on file    Attends religious service: Not on file    Active member of club or organization: Not on file    Attends meetings of clubs or organizations: Not on file    Relationship status: Not on file  Other Topics Concern  . Not on file  Social History Narrative   Patient is right-handed. She lives with her mother and uncle in a one level  home. She occasionally drinks caffeine. She does not exercise.     Family History:  The patient's   family history includes Anxiety disorder in her cousin and paternal aunt; Asthma in her father; Cancer (age of onset: 4240) in her maternal uncle; Cancer (age of onset: 2252) in her father; Cancer (age of onset: 6560) in her paternal grandmother; Depression in her paternal aunt; Diabetes in her mother; Esophageal cancer in her father; Hypertension in her mother; Lupus in her mother.   ROS:   Please see the history of present illness.    ROS All other systems reviewed and are negative.   PHYSICAL EXAM:   VS:  BP 124/64   Pulse 68   Ht 5\' 11"  (1.803 m)   Wt 226 lb 1.9 oz (102.6 kg)   SpO2 98%   BMI 31.54 kg/m   Physical Exam  GEN: Well nourished, well developed, in no acute distress  Neck: no JVD, carotid bruits, or masses Cardiac:RRR; no murmurs, rubs, or gallops  Respiratory:  clear to auscultation bilaterally, normal work of breathing GI: soft, nontender, nondistended, + BS Ext: without cyanosis, clubbing, or edema, Good distal pulses bilaterally Neuro:  Alert and Oriented x 3 Psych: euthymic mood, full affect  Wt Readings from Last 3 Encounters:  07/26/18 226 lb 1.9 oz (102.6 kg)  06/23/18 221 lb 6.4 oz (100.4 kg)  06/11/18 220 lb (99.8 kg)      Studies/Labs Reviewed:   EKG:  EKG is not ordered today.   Recent Labs: 02/06/2018: TSH 0.927 06/11/2018: ALT 20; BUN 11; Creatinine, Ser 0.88; Hemoglobin 11.1; Platelets 330; Potassium 3.7; Sodium 136   Lipid Panel    Component Value Date/Time   CHOL 135 12/12/2015 1441   TRIG 158 (H) 12/12/2015 1441   HDL 36 (L) 12/12/2015 1441   CHOLHDL 3.8 12/12/2015 1441   VLDL 32 (H) 12/12/2015 1441   LDLCALC 67 12/12/2015 1441    Additional studies/ records that were reviewed today include:   05/17/18      1. The left ventricle has normal systolic function with an ejection fraction of 60-65%. The cavity size was normal. Left ventricular  diastolic parameters were normal. No evidence of left ventricular regional wall motion abnormalities. GLS normal,  -22.5%.  2. The right ventricle has normal systolic function. The cavity was normal. There is no increase in right ventricular wall thickness.  3. The aortic valve is tricuspid. No stenosis of the aortic valve.  4. The aortic root is normal in size and structure.  5. No evidence of mitral valve stenosis. No mitral regurgitation.  6. The IVC was normal in size. No complete TR doppler  jet so unable to estimate PA systolic pressure.   FINDINGS  Left Ventricle: The left ventricle has normal systolic function, with an ejection fraction of 60-65%. The cavity size was normal. There is no increase in left ventricular wall thickness. Left ventricular diastolic parameters were normal. No evidence of  left ventricular regional wall motion abnormalities..   Right Ventricle: The right ventricle has normal systolic function. The cavity was normal. There is no increase in right ventricular wall thickness.   Left Atrium: Left atrial size was normal in size.   Right Atrium: Right atrial size was normal in size.   Interatrial Septum: No atrial level shunt detected by color flow Doppler.   Pericardium: There is no evidence of pericardial effusion.   Mitral Valve: The mitral valve is normal in structure. Mitral valve regurgitation is not visualized by color flow Doppler. No evidence of mitral valve stenosis.   Tricuspid Valve: The tricuspid valve is normal in structure. Tricuspid valve regurgitation was not visualized by color flow Doppler.   Aortic Valve: The aortic valve is tricuspid Aortic valve regurgitation was not visualized by color flow Doppler. There is No stenosis of the aortic valve.   Pulmonic Valve: The pulmonic valve was normal in structure. Pulmonic valve regurgitation is not visualized by color flow Doppler.   Aorta: The aortic root is normal in size and structure.   Venous:  The inferior vena cava is normal in size with greater than 50% respiratory variability.      holter monitor 6/2020Sinus bradycardia, normal sinus rhythm and sinus tachycardia. The average heart rate was 77bpm. The heart rate ranged from 45 to 178bpm.  Rare PVCs      ASSESSMENT:    1. Palpitations   2. Essential hypertension   3. Chest pain, unspecified type   4. Obesity (BMI 30-39.9)      PLAN:  In order of problems listed above:  Palpitations-rare PVC's on holter. She never increased her Atenolol  to 25 mg BID because of low pulse. She's feeling much better with change of diet and exercise. Continue atenolol 25 mg once daily and can take extra as needed. F/u with Dr. Radford Pax in 6 months unless GXT abnormal.  Chest pain atypical- GXT hasn't been scheduled yet. yet  Essential HTN BP much better controlled.  Obesity-has started exercising and plant based diet  Medication Adjustments/Labs and Tests Ordered: Current medicines are reviewed at length with the patient today.  Concerns regarding medicines are outlined above.  Medication changes, Labs and Tests ordered today are listed in the Patient Instructions below. Patient Instructions  Medication Instructions:  Your physician has recommended you make the following change in your medication:   TAKE: atenolol 25 mg once a day. You may take an extra tablet once a day as needed for palpitations  Lab work: None Ordered  If you have labs (blood work) drawn today and your tests are completely normal, you will receive your results only by: Marland Kitchen MyChart Message (if you have MyChart) OR . A paper copy in the mail If you have any lab test that is abnormal or we need to change your treatment, we will call you to review the results.  Testing/Procedures: None ordered  Follow-Up: At [facility], you and your health needs are our priority.  As part of our continuing mission to provide you with exceptional heart care, we have created  designated Provider Care Teams.  These Care Teams include your primary Cardiologist (physician) and Advanced Practice Providers (APPs -  Physician Assistants and Nurse Practitioners) who all work together to provide you with the care you need, when you need it. . You will need a follow up appointment in 6 months.  Please call our office 2 months in advance to schedule this appointment.  You may see Armanda Magicraci Turner, MD or one of the following Advanced Practice Providers on your designated Care Team:   . Robbie LisBrittainy Simmons, PA-C . Dayna Dunn, PA-C . Jacolyn ReedyMichele Kaidynce Pfister, PA-C  Any Other Special Instructions Will Be Listed Below (If Applicable).       Elson ClanSigned, Eilene Voigt, PA-C  07/26/2018 9:27 AM    [facility] [address], [place], [place]  [address] Phone: [phone]; Fax: [phone]

## 2018-07-25 NOTE — Telephone Encounter (Signed)
New Message ° ° ° °Left message to confirm appt and answer covid questions  °

## 2018-07-26 ENCOUNTER — Ambulatory Visit (INDEPENDENT_AMBULATORY_CARE_PROVIDER_SITE_OTHER): Payer: Self-pay | Admitting: Physician Assistant

## 2018-07-26 ENCOUNTER — Encounter: Payer: Self-pay | Admitting: Physician Assistant

## 2018-07-26 ENCOUNTER — Encounter (HOSPITAL_COMMUNITY): Payer: Self-pay

## 2018-07-26 ENCOUNTER — Other Ambulatory Visit: Payer: Self-pay

## 2018-07-26 VITALS — BP 124/64 | HR 68 | Ht 71.0 in | Wt 226.1 lb

## 2018-07-26 DIAGNOSIS — E669 Obesity, unspecified: Secondary | ICD-10-CM

## 2018-07-26 DIAGNOSIS — R002 Palpitations: Secondary | ICD-10-CM

## 2018-07-26 DIAGNOSIS — I10 Essential (primary) hypertension: Secondary | ICD-10-CM

## 2018-07-26 DIAGNOSIS — R079 Chest pain, unspecified: Secondary | ICD-10-CM

## 2018-07-26 MED ORDER — LISINOPRIL-HYDROCHLOROTHIAZIDE 10-12.5 MG PO TABS: 1.0000 | ORAL_TABLET | Freq: Every day | ORAL | 3 refills | Status: DC

## 2018-07-26 MED ORDER — ATENOLOL 25 MG PO TABS: 25.0000 mg | ORAL_TABLET | Freq: Every day | ORAL | 3 refills | Status: DC

## 2018-07-26 NOTE — Patient Instructions (Signed)
Medication Instructions:  Your physician has recommended you make the following change in your medication:   TAKE: atenolol 25 mg once a day. You may take an extra tablet once a day as needed for palpitations  Lab work: None Ordered  If you have labs (blood work) drawn today and your tests are completely normal, you will receive your results only by: Marland Kitchen MyChart Message (if you have MyChart) OR . A paper copy in the mail If you have any lab test that is abnormal or we need to change your treatment, we will call you to review the results.  Testing/Procedures: None ordered  Follow-Up: At [facility], you and your health needs are our priority.  As part of our continuing mission to provide you with exceptional heart care, we have created designated Provider Care Teams.  These Care Teams include your primary Cardiologist (physician) and Advanced Practice Providers (APPs -  Physician Assistants and Nurse Practitioners) who all work together to provide you with the care you need, when you need it. . You will need a follow up appointment in 6 months.  Please call our office 2 months in advance to schedule this appointment.  You may see Fransico Him, MD or one of the following Advanced Practice Providers on your designated Care Team:   . Lyda Jester, PA-C . Dayna Dunn, PA-C . Ermalinda Barrios, PA-C  Any Other Special Instructions Will Be Listed Below (If Applicable).

## 2018-07-27 ENCOUNTER — Ambulatory Visit (INDEPENDENT_AMBULATORY_CARE_PROVIDER_SITE_OTHER): Payer: Medicaid Other | Admitting: Psychiatry

## 2018-07-27 DIAGNOSIS — F411 Generalized anxiety disorder: Secondary | ICD-10-CM

## 2018-07-27 DIAGNOSIS — F410 Panic disorder [episodic paroxysmal anxiety] without agoraphobia: Secondary | ICD-10-CM

## 2018-07-27 MED ORDER — PAROXETINE HCL 20 MG PO TABS: 20.0000 mg | ORAL_TABLET | Freq: Every day | ORAL | 1 refills | Status: DC

## 2018-07-27 NOTE — Progress Notes (Signed)
BH MD/PA/NP OP Progress Note  07/27/2018 1:06 PM Diane Lloyd  MRN:  [id number] Interview was conducted by phone and I verified that I was speaking with the correct person using two identifiers. I discussed the limitations of evaluation and management by telemedicine and  the availability of in person appointments. Patient expressed understanding and agreed to proceed.  Chief Complaint: "I feel much better".  HPI: 28 yo single AAF with new onset of generalized anxiety (wories and ruminations about current situation with COVID-19 most of all) and infrequent panic attacks. No clear sx of depression. 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 She has no hx of major depression, mania, psychosis or alcohol/drug addiction. She was briefly in counseling and has never tried psychotropic medications. We added paroxetine 20 mg in am with breakfast and trazodone 50-100 mg at HS for insomnia. Panic attacks fully resolved and generalized anxiety symptoms appear to have resolved as well. She no longer needs to take trazodone.   Visit Diagnosis:    ICD-10-CM   1. Generalized anxiety disorder with panic attacks  F41.1    F41.0     Past Psychiatric History: Please see intake H&P.  Past Medical History:  Past Medical History:  Diagnosis Date  . Anemia    iron def  . Anxiety   . Asthma   . Hypertension   . Obesity, unspecified   . PCOS (polycystic ovarian syndrome)   . Positive ANA (antinuclear antibody) 05/05/2017   [facility]; ANA 1:160, speckled pattern    Past Surgical History:  Procedure Laterality Date  . TONSILLECTOMY      Family Psychiatric History: Reviewed.  Family History:  Family History  Problem Relation Age of Onset  . Cancer Father 352       esophagus  . Asthma Father   . Esophageal cancer Father   . Hypertension Mother   . Lupus Mother   . Diabetes Mother   .  Cancer Maternal Uncle 40       pancreatic cancer  . Cancer Paternal Grandmother [id number]       esophageal  . Anxiety disorder Paternal Aunt   . Depression Paternal Aunt   . Anxiety disorder Cousin   . Colon cancer Neg Hx   . Ulcerative colitis Neg Hx     Social History:  Social History   Socioeconomic History  . Marital status: Single    Spouse name: Not on file  . Number of children: 0  . Years of education: Not on file  . Highest education level: Some college, no degree  Occupational History  . Occupation: CSR    Employer: [employer]  Social Needs  . Financial resource strain: Not on file  . Food insecurity    Worry: Not on file    Inability: Not on file  . Transportation needs    Medical: Not on file    Non-medical: Not on file  Tobacco Use  . Smoking status: Never Smoker  . Smokeless tobacco: Never Used  Substance and Sexual Activity  . Alcohol use: Yes    Comment: occasional  . Drug use: Not Currently    Types: Marijuana  . Sexual activity: Yes    Birth control/protection: None  Lifestyle  . Physical activity    Days per week: Not on file    Minutes per session: Not on file  . Stress: Not on file  Relationships  . Social Musicianconnections    Talks on phone: Not on file    Gets together: Not on file    Attends religious service: Not on file    Active member of club or organization: Not on file    Attends meetings of clubs or organizations: Not on file    Relationship status: Not on file  Other Topics Concern  . Not on file  Social History Narrative   Patient is right-handed. She lives with her mother and uncle in a one level home. She occasionally drinks caffeine. She does not exercise.    Allergies:  Allergies  Allergen Reactions  . Diflucan [Fluconazole] Itching and Rash    Metabolic Disorder Labs: Lab Results  Component Value Date   HGBA1C 5.0 02/06/2018   Lab Results  Component Value Date   PROLACTIN 10.7 10/05/2011   Lab Results  Component Value  Date   CHOL 135 12/12/2015   TRIG 158 (H) 12/12/2015   HDL 36 (L) 12/12/2015   CHOLHDL 3.8 12/12/2015   VLDL 32 (H) 12/12/2015   LDLCALC 67 12/12/2015   LDLCALC 67 03/17/2011   Lab Results  Component Value Date   TSH 0.927 02/06/2018   TSH 0.555 12/20/2017    Therapeutic Level Labs: No results found for: LITHIUM No results found for: VALPROATE No components found for:  CBMZ  Current Medications: Current Outpatient Medications  Medication Sig Dispense Refill  . acetaminophen (TYLENOL) 500 MG tablet Take 500 mg by mouth every 6 (six) hours as needed for headache.    Marland Kitchen. atenolol (TENORMIN) 25 MG tablet Take 1 tablet (25 mg total) by mouth daily. You may take an extra tablet as needed for palpitations 90 tablet 3  . benzonatate (TESSALON) 100 MG capsule Take 1-2 capsules (100-200 mg total) by mouth 3 (three) times daily as needed. 60 capsule 0  . lisinopril-hydrochlorothiazide (ZESTORETIC) 10-12.5 MG tablet Take 1 tablet by mouth daily. To lower blood pressure 90 tablet 3  . omeprazole (PRILOSEC) 40 MG capsule Take 40 mg by mouth daily.    Marland Kitchen. PARoxetine (PAXIL) 20 MG tablet Take 1 tablet (20 mg total) by mouth daily for 30 days. 30 tablet 0   No current facility-administered medications for this visit.     Psychiatric Specialty Exam: Review of Systems  All other systems reviewed and are negative.   There were no vitals taken for this visit.There is no height or weight on file to calculate BMI.  General Appearance: NA  Eye Contact:  NA  Speech:  Clear and Coherent and Normal Rate  Volume:  Normal  Mood:  Mild anxiety  Affect:  NA  Thought Process:  Goal Directed  Orientation:  Full (Time, Place, and Person)  Thought Content: Logical   Suicidal Thoughts:  No  Homicidal Thoughts:  No  Memory:  Immediate;   Good Recent;   Good Remote;   Good  Judgement:  Good  Insight:  Good  Psychomotor Activity:  NA  Concentration:  Concentration: Good and Attention Span: Good  Recall:   Good  Fund of Knowledge: Good  Language: Good  Akathisia:  Negative  Handed:  Right  AIMS (if indicated): not done  Assets:  ArchitectCommunication Skills Financial Resources/Insurance Housing Resilience Vocational/Educational  ADL's:  Intact  Cognition: WNL  Sleep:  Good   Screenings: GAD-7     [facility]  Visit from 06/06/2018 in [place] Office Visit from 04/28/2018 in [place] Office Visit from 03/06/2018 in [place]  Total GAD-7 Score  5  15  5  18  19     PHQ2-9     Office Visit from 06/20/2018 in [place] Office Visit from 06/15/2018 in [place] Office Visit from 06/06/2018 in [place] Office Visit from 04/28/2018 in [place] [address] Office Visit from 03/06/2018 in [place]  PHQ-2 Total Score  0  2  2  0  4  PHQ-9 Total Score  1  3  2   0  10       Assessment and Plan: 28 yo single AAF with new onset of generalized anxiety (wories and ruminations about current situation with COVID-19 most of all) and infrequent panic attacks. No clear sx of depression. 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 She has no hx of major depression, mania, psychosis or alcohol/drug addiction. She was briefly in counseling and has never tried psychotropic medications. We added paroxetine 20 mg in am with breakfast and trazodone 50-100 mg at HS for insomnia. Panic attacks fully resolved and generalized anxiety symptoms appear to have resolved as well. She no longer uses trazodone.   Dx: GAD  Plan: Continue paroxetine 20 mg daily, do not renew  trazodone 100 mg. Next appointment in 3 months or prn. The plan was discussed with patient who had an opportunity to ask questions and these were all answered. I spend 25 minutes in phone consultation with the patient.    Stephanie Acre, MD 07/27/2018, 1:06 PM

## 2018-07-31 ENCOUNTER — Telehealth: Payer: Self-pay | Admitting: Family Medicine

## 2018-07-31 NOTE — Telephone Encounter (Signed)
Pt called and has concerns, since her CT scan came in pt is still having headaches and would like to know the next step.. please follow up

## 2018-08-02 NOTE — Telephone Encounter (Signed)
Can you have her schedule a follow-up appointment regarding her headaches

## 2018-08-03 NOTE — Telephone Encounter (Signed)
Nurse called the patient's home phone number but received no answer and message was left on the voicemail for the patient to call back.  Return phone number given. 

## 2018-08-03 NOTE — Telephone Encounter (Signed)
Patients call taken.  Patient identified by name and date of birth.  Patient informed that she coulr have an appointment with Dr. Chapman Fitch tomorrow at 1640.  Patient acknowledged understanding of advice.

## 2018-08-04 ENCOUNTER — Other Ambulatory Visit: Payer: Self-pay

## 2018-08-04 ENCOUNTER — Encounter: Payer: Self-pay | Admitting: Family Medicine

## 2018-08-04 ENCOUNTER — Ambulatory Visit: Payer: Self-pay | Attending: Family Medicine | Admitting: Family Medicine

## 2018-08-04 ENCOUNTER — Telehealth: Payer: Self-pay | Admitting: Cardiology

## 2018-08-04 DIAGNOSIS — R519 Headache, unspecified: Secondary | ICD-10-CM

## 2018-08-04 DIAGNOSIS — R350 Frequency of micturition: Secondary | ICD-10-CM

## 2018-08-04 DIAGNOSIS — N926 Irregular menstruation, unspecified: Secondary | ICD-10-CM

## 2018-08-04 DIAGNOSIS — R1032 Left lower quadrant pain: Secondary | ICD-10-CM

## 2018-08-04 DIAGNOSIS — R202 Paresthesia of skin: Secondary | ICD-10-CM

## 2018-08-04 DIAGNOSIS — R51 Headache: Secondary | ICD-10-CM

## 2018-08-04 DIAGNOSIS — R110 Nausea: Secondary | ICD-10-CM

## 2018-08-04 DIAGNOSIS — R42 Dizziness and giddiness: Secondary | ICD-10-CM

## 2018-08-04 DIAGNOSIS — R1031 Right lower quadrant pain: Secondary | ICD-10-CM

## 2018-08-04 MED ORDER — ATENOLOL 25 MG PO TABS: 25.0000 mg | ORAL_TABLET | Freq: Every day | ORAL | 6 refills | Status: DC

## 2018-08-04 NOTE — Progress Notes (Signed)
Patient is having abdominal pain with nausea. 

## 2018-08-04 NOTE — Telephone Encounter (Signed)
**Note De-Identified Drevin Ortner Obfuscation** I have sent the pts Atenolol RX in to be filled as requested.

## 2018-08-04 NOTE — Progress Notes (Signed)
Virtual Visit via Telephone Note  I connected with Linzie Collin on 08/04/18 at  4:10 PM EDT by telephone and verified that I am speaking with the correct person using two identifiers.   I discussed the limitations, risks, security and privacy concerns of performing an evaluation and management service by telephone and the availability of in person appointments. I also discussed with the patient that there may be a patient responsible charge related to this service. The patient expressed understanding and agreed to proceed.  Patient Location: Home Provider Location: Office at [place] Others participating in call: just patient and provider   History of Present Illness:      28 yo female seen due to the complaint of continued generalized headaches which she reports are now less intense and do not occur daily. Headaches are still dull and generalized but patient also reports recent onset of pins and needles sensation in her entire scalp as well as episodes of dizziness for the past 3 days.  Patient reports that she does not have the dizziness or abnormal scalp sensation today.  When she was having the dizziness it was worse with standing/walking and she would feel as if she was off balance.  When she was sit down, she would have the sensation that objects around her were moving but this would quickly resolve as well as the dizzy sensation when she was sitting down.  She has had no recent fever or chills, no nasal congestion or sore throat.  Patient has had recent recurrent episodes of severe nausea which occur 3-4 times per day but she has not thrown up.  She denies pregnancy although her period is late.  She states that she took a home pregnancy test a week ago which was negative.  She is also been having some crampy lower abdominal pain, mostly in the center of her lower abdomen as if her.  Wants to start but has not done so.  Cramping sensation has been present for the past 2 weeks.  She is also having  more frequent urination but no burning or discomfort with urination.  Patient states that the nausea can occur randomly and sometimes also occurs while she is eating.  She denies any chest pain or palpitations.  No shortness of breath or cough.  No sore throat or difficulty swallowing.   Past Medical History:  Diagnosis Date  . Anemia    iron def  . Anxiety   . Asthma   . Hypertension   . Obesity, unspecified   . PCOS (polycystic ovarian syndrome)   . Positive ANA (antinuclear antibody) 05/05/2017   done by [facility] in [facility]; ANA 1:160, speckled pattern    Past Surgical History:  Procedure Laterality Date  . TONSILLECTOMY      Family History  Problem Relation Age of Onset  . Cancer Father 41       esophagus  . Asthma Father   . Esophageal cancer Father   . Hypertension Mother   . Lupus Mother   . Diabetes Mother   . Cancer Maternal Uncle 40       pancreatic cancer  . Cancer Paternal Grandmother 68       esophageal  . Anxiety disorder Paternal Aunt   . Depression Paternal Aunt   . Anxiety disorder Cousin   . Colon cancer Neg Hx   . Ulcerative colitis Neg Hx     Social History   Tobacco Use  . Smoking status: Never Smoker  .  Smokeless tobacco: Never Used  Substance Use Topics  . Alcohol use: Yes    Comment: occasional  . Drug use: Not Currently    Types: Marijuana     Allergies  Allergen Reactions  . Diflucan [Fluconazole] Itching and Rash       Observations/Objective: No vital signs or physical exam conducted as visit was done via telephone  Assessment and Plan: 1. Dizziness Patient reports onset for the past 3 days of recurrent dizziness and abnormal sensation to the scalp.  She is also had some nausea and increased urinary frequency along with lower abdominal cramping and states that her menses are late.  She has been asked to come into the clinic for lab work including BMP to check for electrolyte abnormality or elevated glucose due to her  complaints of urinary frequency and dizziness.  She will also have a urine pregnancy test due to her late menses and nausea though urinary tract infection may also be contributing or causing her nausea and lower abdominal cramping.  Patient should rest and remain well-hydrated in the meantime but go to the emergency department if she has any acute increase in any of her symptoms.  2. Paresthesia Patient with complaint of recent onset of scalp paresthesias for the past 3 days.  I am not sure of the cause of the symptoms.  She reports that the abnormal sensation is not present today.  She has had a recent head CT which showed no acute abnormalities.  She will have BMP to look for electrolyte abnormality that may be contributing to her complaint of dizziness and abnormal scalp sensation.  3. Nausea; 4.  Urinary frequency; late menses; bilateral lower abdominal cramping Patient with complaint of recurrent nausea as well as late menses and lower abdominal cramping.  Patient has been asked to schedule either an in office visit next week or lab visit to have urinalysis to look for urinary tract infection as well as urine pregnancy test to look for possible pregnancy as a cause of her symptoms.  Future lab orders placed.  6. Recurrent headache Patient has had recent CT scan done in follow-up of her recurrent headaches and CT scan was negative for any acute abnormalities.  She does report less frequent and less intense headaches though she has had new onset of abnormal sensation in the scalp for the past 3 days.  Patient can continue the use of over-the-counter Tylenol as needed for headaches or scalp pain but should avoid the use of ibuprofen at this time in case she is pregnant.   Follow Up Instructions:Return in about 1 week (around 08/11/2018) for lab/office visit next week; go to ED if symptoms worsen this weekend.    I discussed the assessment and treatment plan with the patient. The patient was provided  an opportunity to ask questions and all were answered. The patient agreed with the plan and demonstrated an understanding of the instructions.   The patient was advised to call back or seek an in-person evaluation if the symptoms worsen or if the condition fails to improve as anticipated.  I provided 25 minutes of non-face-to-face time during this encounter which includes 12 minutes of speaking with the patient regarding her current symptoms as well as review of recent notes, labs and imaging and placing orders for upcoming tests and advising patient regarding her current symptoms.   Cain Saupeammie Rayen Palen, MD

## 2018-08-04 NOTE — Telephone Encounter (Signed)
New Message   *STAT* If patient is at the pharmacy, call can be transferred to refill team.   1. Which medications need to be refilled? (please list name of each medication and dose if known)  atenolol (TENORMIN) 25 MG tablet 2. Which pharmacy/location (including street and city if local pharmacy) is medication to be sent to? [facility] ([place] - [address] 3. Do they need a 30 day or 90 day supply? 30 day supply

## 2018-08-09 ENCOUNTER — Ambulatory Visit: Payer: Self-pay | Attending: Family Medicine

## 2018-08-09 ENCOUNTER — Emergency Department (HOSPITAL_BASED_OUTPATIENT_CLINIC_OR_DEPARTMENT_OTHER)
Admission: EM | Admit: 2018-08-09 | Discharge: 2018-08-10 | Disposition: A | Discharge status: Home / Self Care | Payer: Self-pay | Attending: Emergency Medicine | Admitting: Emergency Medicine

## 2018-08-09 ENCOUNTER — Encounter (HOSPITAL_BASED_OUTPATIENT_CLINIC_OR_DEPARTMENT_OTHER): Payer: Self-pay

## 2018-08-09 ENCOUNTER — Other Ambulatory Visit: Payer: Self-pay

## 2018-08-09 DIAGNOSIS — R350 Frequency of micturition: Secondary | ICD-10-CM

## 2018-08-09 DIAGNOSIS — R42 Dizziness and giddiness: Secondary | ICD-10-CM

## 2018-08-09 DIAGNOSIS — R110 Nausea: Secondary | ICD-10-CM

## 2018-08-09 DIAGNOSIS — Z79899 Other long term (current) drug therapy: Secondary | ICD-10-CM | POA: Insufficient documentation

## 2018-08-09 DIAGNOSIS — R0789 Other chest pain: Secondary | ICD-10-CM | POA: Insufficient documentation

## 2018-08-09 DIAGNOSIS — I10 Essential (primary) hypertension: Secondary | ICD-10-CM | POA: Insufficient documentation

## 2018-08-09 DIAGNOSIS — J45909 Unspecified asthma, uncomplicated: Secondary | ICD-10-CM | POA: Insufficient documentation

## 2018-08-09 DIAGNOSIS — R202 Paresthesia of skin: Secondary | ICD-10-CM

## 2018-08-09 NOTE — Addendum Note (Signed)
Addended by: Emilio Aspen A on: 08/09/2018 11:24 AM   Modules accepted: Orders

## 2018-08-09 NOTE — ED Triage Notes (Signed)
Pt c/o CP x today-NAD-steady gait-pt with recent cards work up-NAD-steady gait

## 2018-08-09 NOTE — ED Notes (Signed)
Pt states CP that started this morning. Reports that she went to her PCP this morning due to chronic HAs to have bloodwork done but did not mention to PCP that she was having CP. States that the pain is intermittent and radiates into her back.  Denies SOB, reports nausea with the pain.

## 2018-08-10 ENCOUNTER — Telehealth: Payer: Self-pay | Admitting: Cardiology

## 2018-08-10 ENCOUNTER — Emergency Department (HOSPITAL_BASED_OUTPATIENT_CLINIC_OR_DEPARTMENT_OTHER): Payer: Self-pay

## 2018-08-10 DIAGNOSIS — I10 Essential (primary) hypertension: Secondary | ICD-10-CM

## 2018-08-10 LAB — BASIC METABOLIC PANEL WITH GFR
BUN/Creatinine Ratio: 9 (ref 9–23)
BUN: 8 mg/dL (ref 6–20)
CO2: 21 mmol/L (ref 20–29)
Calcium: 9.1 mg/dL (ref 8.7–10.2)
Chloride: 102 mmol/L (ref 96–106)
Creatinine, Ser: 0.85 mg/dL (ref 0.57–1.00)
GFR calc Af Amer: 109 mL/min/1.73
GFR calc non Af Amer: 95 mL/min/1.73
Glucose: 82 mg/dL (ref 65–99)
Potassium: 4.2 mmol/L (ref 3.5–5.2)
Sodium: 139 mmol/L (ref 134–144)

## 2018-08-10 MED ORDER — NAPROXEN 250 MG PO TABS
500.0000 mg | ORAL_TABLET | Freq: Once | ORAL | Status: AC
  Administered 2018-08-10: 500 mg via ORAL
  Filled 2018-08-10: qty 2

## 2018-08-10 MED ORDER — LISINOPRIL-HYDROCHLOROTHIAZIDE 20-25 MG PO TABS: 1.0000 | ORAL_TABLET | Freq: Every day | ORAL | 1 refills | Status: DC

## 2018-08-10 MED ORDER — NAPROXEN 375 MG PO TABS: ORAL_TABLET | ORAL | 0 refills | Status: DC

## 2018-08-10 NOTE — Telephone Encounter (Signed)
Per pt B/P has been elevated for 2 days 170/90 before meds and pt did take an extra lisinopril/HCTZ today and B/P remains elevated at 153/90 HR been running in the 60's Pt has started eating healthier over the last 2 days (drinking more water and eating more fruits)the patient went to ED yesterday for chest pain and was discharged dx chest wall pain Pt still awaiting to get GXT scheduled Will forward to Dr Radford Pax for review and recommendations ./cy

## 2018-08-10 NOTE — Telephone Encounter (Signed)
Spoke with pt and went over recommendations.  Pt verbalized understanding and was in agreement with plan.  She will come for labs 8/6.

## 2018-08-10 NOTE — Telephone Encounter (Signed)
Follow up ° ° °Patient is returning your call. Please call. ° ° ° °

## 2018-08-10 NOTE — Telephone Encounter (Signed)
Lm for pt to call back ./cy 

## 2018-08-10 NOTE — ED Provider Notes (Signed)
[facility] Provider Note: Diane Spurling, MD, FACEP  CSN: [id number] MRN: [id number] ARRIVAL: 08/09/18 at 2147 ROOM: [place]  Chest Pain   HISTORY OF PRESENT ILLNESS  08/10/18 12:33 AM Diane Lloyd is a 28 y.o. female who complains of left upper chest pain since yesterday.  She has had similar pain in the past and has had a cardiology work-up that included a Holter monitor and echocardiogram which were unremarkable.  She still has a stress test pending.  The pain is dull, located in the left upper chest radiating laterally, is worse with palpation, and is rated a 6 out of 10.  She denies associated cough, shortness of breath or rash.  She was seen at the [facility] yesterday but failed to mention her chest pain.  She denies leg pain, leg swelling or recent travel.   Past Medical History:  Diagnosis Date  . Anemia    iron def  . Anxiety   . Asthma   . Hypertension   . Obesity, unspecified   . PCOS (polycystic ovarian syndrome)   . Positive ANA (antinuclear antibody) 05/05/2017   done by [facility]umat[facility] in [facility]; ANA 1:160, speckled pattern    Past Surgical History:  Procedure Laterality Date  . TONSILLECTOMY      Family History  Problem Relation Age of Onset  . Cancer Father 53       esophagus  . Asthma Father   . Esophageal cancer Father   . Hypertension Mother   . Lupus Mother   . Diabetes Mother   . Cancer Maternal Uncle 40       pancreatic cancer  . Cancer Paternal Grandmother 45       esophageal  . Anxiety disorder Paternal Aunt   . Depression Paternal Aunt   . Anxiety disorder Cousin   . Colon cancer Neg Hx   . Ulcerative colitis Neg Hx     Social History   Tobacco Use  . Smoking status: Never Smoker  . Smokeless tobacco: Never Used  Substance Use Topics  . Alcohol use: Yes    Comment: occasional  . Drug use: Not Currently    Types: Marijuana    Prior to Admission medications    Medication Sig Start Date End Date Taking? Authorizing Provider  atenolol (TENORMIN) 25 MG tablet Take 1 tablet (25 mg total) by mouth daily. You may take an extra tablet as needed for palpitations 08/04/18   Sueanne Margarita, MD  lisinopril-hydrochlorothiazide (ZESTORETIC) 10-12.5 MG tablet Take 1 tablet by mouth daily. To lower blood pressure 07/26/18   Imogene Burn, PA-C  naproxen (NAPROSYN) 375 MG tablet Take 1 tablet twice daily as needed for chest wall pain. 08/10/18   Glora Hulgan, MD  omeprazole (PRILOSEC) 40 MG capsule Take 40 mg by mouth daily.    [provider]  PARoxetine (PAXIL) 20 MG tablet Take 1 tablet (20 mg total) by mouth daily. 07/27/18 01/23/19  Pucilowski, Marchia Bond, MD  cetirizine (ZYRTEC) 10 MG tablet Take 1 tablet (10 mg total) by mouth at bedtime. To help with head pressure/congestion 06/23/18 07/16/18  Fulp, Ander Gaster, MD  ferrous sulfate 325 (65 FE) MG tablet Take 1 tablet (325 mg total) by mouth daily with breakfast. TAKE WITH ORANGE JUICE Patient not taking: Reported on 06/15/2018 04/28/18 07/16/18  Street, [place], PA-C    Allergies Diflucan [fluconazole]   REVIEW OF SYSTEMS  Negative except as noted here or  in the History of Present Illness.   PHYSICAL EXAMINATION  Initial Vital Signs Blood pressure (!) 155/87, pulse 89, temperature 98.6 F (37 C), temperature source Oral, resp. rate 18, height 5\' 11"  (1.803 m), weight 102.5 kg, last menstrual period 08/04/2018, SpO2 100 %.  Examination General: Well-developed, well-nourished female in no acute distress; appearance consistent with age of record HENT: normocephalic; atraumatic Eyes: pupils equal, round and reactive to light; extraocular muscles intact Neck: supple Heart: regular rate and rhythm Lungs: clear to auscultation bilaterally Chest: Left upper and lateral chest wall tenderness without deformity or crepitus Abdomen: soft; nondistended; nontender; bowel sounds present Extremities: No deformity;  full range of motion; pulses normal Neurologic: Awake, alert and oriented; motor function intact in all extremities and symmetric; no facial droop Skin: Warm and dry Psychiatric: Normal mood and affect   RESULTS  Summary of this visit's results, reviewed by myself:   EKG Interpretation  Date/Time:  Wednesday August 09 2018 21:56:09 EDT Ventricular Rate:  86 PR Interval:  132 QRS Duration: 94 QT Interval:  366 QTC Calculation: 437 R Axis:   45 Text Interpretation:  Normal sinus rhythm Normal ECG No significant change was found Confirmed by Paula LibraMolpus, Baley Shands ([id number]) on 08/09/2018 10:49:38 PM      Laboratory Studies: No results found for this or any previous visit (from the past 24 hour(s)). Imaging Studies: Dg Chest 2 View  Result Date: 08/10/2018 CLINICAL DATA:  Left chest pain EXAM: CHEST - 2 VIEW COMPARISON:  06/11/2018 FINDINGS: Lungs are clear.  No pleural effusion or pneumothorax. The heart is normal in size. Visualized osseous structures are within normal limits. IMPRESSION: Normal chest radiographs. Electronically Signed   By: Charline BillsSriyesh  Krishnan M.D.   On: 08/10/2018 01:20    ED COURSE and MDM  Nursing notes and initial vitals signs, including pulse oximetry, reviewed.  Vitals:   08/09/18 2201  BP: (!) 155/87  Pulse: 89  Resp: 18  Temp: 98.6 F (37 C)  TempSrc: Oral  SpO2: 100%  Weight: 102.5 kg  Height: 5\' 11"  (1.803 m)   1:29 AM Patient's pain likely due to costochondritis especially given her reassuring recent cardiac work-up.  She is feeling somewhat better after oral naproxen.  PROCEDURES    ED DIAGNOSES     ICD-10-CM   1. Chest wall pain  R07.89        Paula LibraMolpus, Gioia Ranes, MD 08/10/18 0130

## 2018-08-10 NOTE — Telephone Encounter (Signed)
New Message     Pt is having high BP and went to be seen in the ER because she was having chest pain as well. Today BP 175/105   She is wondering if she needs to adjust her medication    Please call

## 2018-08-10 NOTE — Telephone Encounter (Signed)
Increase lisinopril HCT to 20-25mg  daily and check BP and HR daily for a week and call with the results.  Needs BMET in 1 week

## 2018-08-11 LAB — URINE CULTURE

## 2018-08-17 ENCOUNTER — Other Ambulatory Visit: Payer: Self-pay

## 2018-08-23 ENCOUNTER — Other Ambulatory Visit: Payer: Self-pay

## 2018-08-23 ENCOUNTER — Other Ambulatory Visit: Payer: Self-pay | Admitting: *Deleted

## 2018-08-23 DIAGNOSIS — I10 Essential (primary) hypertension: Secondary | ICD-10-CM

## 2018-08-23 LAB — BASIC METABOLIC PANEL
BUN/Creatinine Ratio: 15 (ref 9–23)
BUN: 11 mg/dL (ref 6–20)
CO2: 23 mmol/L (ref 20–29)
Calcium: 9.7 mg/dL (ref 8.7–10.2)
Chloride: 99 mmol/L (ref 96–106)
Creatinine, Ser: 0.75 mg/dL (ref 0.57–1.00)
GFR calc Af Amer: 127 mL/min/{1.73_m2} (ref >59)
GFR calc non Af Amer: 110 mL/min/{1.73_m2} (ref >59)
Glucose: 103 mg/dL — ABNORMAL HIGH (ref 65–99)
Potassium: 4.2 mmol/L (ref 3.5–5.2)
Sodium: 137 mmol/L (ref 134–144)

## 2018-08-31 ENCOUNTER — Telehealth: Payer: Self-pay | Admitting: Cardiology

## 2018-08-31 NOTE — Telephone Encounter (Signed)
Attempted to call back; phone immediately went to voice mail

## 2018-08-31 NOTE — Telephone Encounter (Signed)
Patient states she began feeling dizzy yesterday. Report BP was 96/52 mmHg and throughout the day systolic readings remained in the low 956'L and diastolic readings remained in the low 60's. She reports a low BP reading this morning so she only took 1/2 of her lisinopril-HCTZ and felt better today. BP is currently 127/76 mmHg. I asked about hydration and nutrition and she admits to only drinking 2-3 bottles of water daily. She asked about good food sources of water and we discussed eating a balanced diet of fruits, vegetables, and lean protein. I advised her to limit intake of  sugary electrolyte drinks and to be aware of sudden change in positions when BP is low. She asks about lisinopril-HCTZ dose and I advised she may try taking 1/2 in the morning and 1/2 in the evening as she works on better hydration and to call back if symptoms do not improve over the next week or so. She verbalized understanding and agreement and thanked me for the call.

## 2018-08-31 NOTE — Telephone Encounter (Signed)
New Message   Pt c/o BP issue:  1. What are your last 5 BP readings? 96/50 (yesterday),  117/64 (today) 2. Are you having any other symptoms (ex. Dizziness, headache, blurred vision, passed out)? Some dizziness on yesterday 3. What is your medication issue?

## 2018-08-31 NOTE — Telephone Encounter (Signed)
Pt returned your call please give her a call back. °

## 2018-08-31 NOTE — Telephone Encounter (Signed)
Agree with recommendations.  

## 2018-08-31 NOTE — Telephone Encounter (Signed)
Left message for patient to call back  

## 2018-09-24 ENCOUNTER — Other Ambulatory Visit: Payer: Self-pay

## 2018-09-24 ENCOUNTER — Emergency Department (HOSPITAL_BASED_OUTPATIENT_CLINIC_OR_DEPARTMENT_OTHER)
Admission: EM | Admit: 2018-09-24 | Discharge: 2018-09-24 | Disposition: A | Discharge status: Home / Self Care | Payer: Medicaid Other | Attending: Emergency Medicine | Admitting: Emergency Medicine

## 2018-09-24 ENCOUNTER — Encounter (HOSPITAL_BASED_OUTPATIENT_CLINIC_OR_DEPARTMENT_OTHER): Payer: Self-pay | Admitting: Emergency Medicine

## 2018-09-24 DIAGNOSIS — I10 Essential (primary) hypertension: Secondary | ICD-10-CM | POA: Insufficient documentation

## 2018-09-24 DIAGNOSIS — Z79899 Other long term (current) drug therapy: Secondary | ICD-10-CM | POA: Insufficient documentation

## 2018-09-24 DIAGNOSIS — R079 Chest pain, unspecified: Secondary | ICD-10-CM

## 2018-09-24 DIAGNOSIS — J45909 Unspecified asthma, uncomplicated: Secondary | ICD-10-CM | POA: Insufficient documentation

## 2018-09-24 DIAGNOSIS — R0789 Other chest pain: Secondary | ICD-10-CM | POA: Insufficient documentation

## 2018-09-24 LAB — COMPREHENSIVE METABOLIC PANEL
ALT: 31 U/L (ref 0–44)
AST: 23 U/L (ref 15–41)
Albumin: 4 g/dL (ref 3.5–5.0)
Alkaline Phosphatase: 53 U/L (ref 38–126)
Anion gap: 11 (ref 5–15)
BUN: 10 mg/dL (ref 6–20)
CO2: 24 mmol/L (ref 22–32)
Calcium: 9.4 mg/dL (ref 8.9–10.3)
Chloride: 101 mmol/L (ref 98–111)
Creatinine, Ser: 0.71 mg/dL (ref 0.44–1.00)
GFR calc Af Amer: >60 mL/min (ref >60)
GFR calc non Af Amer: >60 mL/min (ref >60)
Glucose, Bld: 104 mg/dL — ABNORMAL HIGH (ref 70–99)
Potassium: 3.8 mmol/L (ref 3.5–5.1)
Sodium: 136 mmol/L (ref 135–145)
Total Bilirubin: 0.5 mg/dL (ref 0.3–1.2)
Total Protein: 7.3 g/dL (ref 6.5–8.1)

## 2018-09-24 LAB — CBC WITH DIFFERENTIAL/PLATELET
Abs Immature Granulocytes: 0.01 10*3/uL (ref 0.00–0.07)
Basophils Absolute: 0 10*3/uL (ref 0.0–0.1)
Basophils Relative: 0 %
Eosinophils Absolute: 0 10*3/uL (ref 0.0–0.5)
Eosinophils Relative: 0 %
HCT: 34.7 % — ABNORMAL LOW (ref 36.0–46.0)
Hemoglobin: 10.5 g/dL — ABNORMAL LOW (ref 12.0–15.0)
Immature Granulocytes: 0 %
Lymphocytes Relative: 30 %
Lymphs Abs: 1.7 10*3/uL (ref 0.7–4.0)
MCH: 23.7 pg — ABNORMAL LOW (ref 26.0–34.0)
MCHC: 30.3 g/dL (ref 30.0–36.0)
MCV: 78.3 fL — ABNORMAL LOW (ref 80.0–100.0)
Monocytes Absolute: 0.4 10*3/uL (ref 0.1–1.0)
Monocytes Relative: 7 %
Neutro Abs: 3.5 10*3/uL (ref 1.7–7.7)
Neutrophils Relative %: 63 %
Platelets: 305 10*3/uL (ref 150–400)
RBC: 4.43 MIL/uL (ref 3.87–5.11)
RDW: 16.3 % — ABNORMAL HIGH (ref 11.5–15.5)
WBC: 5.7 10*3/uL (ref 4.0–10.5)
nRBC: 0 % (ref 0.0–0.2)

## 2018-09-24 LAB — PREGNANCY, URINE: Preg Test, Ur: NEGATIVE

## 2018-09-24 LAB — TROPONIN I (HIGH SENSITIVITY): Troponin I (High Sensitivity): <2 ng/L (ref <18)

## 2018-09-24 MED ORDER — LIDOCAINE VISCOUS HCL 2 % MT SOLN
15.0000 mL | Freq: Once | OROMUCOSAL | Status: AC
  Administered 2018-09-24: 15 mL via ORAL
  Filled 2018-09-24: qty 15

## 2018-09-24 MED ORDER — ALUM & MAG HYDROXIDE-SIMETH 200-200-20 MG/5ML PO SUSP
30.0000 mL | Freq: Once | ORAL | Status: AC
  Administered 2018-09-24: 30 mL via ORAL
  Filled 2018-09-24: qty 30

## 2018-09-24 NOTE — Discharge Instructions (Addendum)
You were seen in the emergency department for burning chest pain.  You had blood work and an EKG that did not show any serious findings.  This is likely related to reflux and you should continue Maalox in between meals and at bedtime.  Please continue your regular medications and avoid NSAIDs like naproxen.  Follow-up with your doctor and return if any worsening symptoms.

## 2018-09-24 NOTE — ED Triage Notes (Signed)
Chest pain for 1 month, described as burning.

## 2018-09-24 NOTE — ED Provider Notes (Signed)
[facility] EMERGENCY DEPARTMENT Provider Note   CSN: [id number] Arrival date & time: 09/24/18  7564     History   Chief Complaint Chief Complaint  Patient presents with  . Chest Pain    HPI Diane Lloyd is a 28 y.o. female.  She has history of hypertension.  She is presenting with a complaint of burning chest pain that started last evening and kept her up through the night.  She said she has had chest pain before but the burning with some pain in.  She ate some buffalo chicken last night.  She tried Tums and her omeprazole without any improvement in her symptoms.  She does not feel short of breath.  It radiates through to her back.  She denies any cocaine.  She is a non-smoker and is not on any OCPs.    The history is provided by the patient.  Chest Pain Pain location:  Substernal area Pain quality: burning   Pain radiates to:  Mid back Pain severity:  Moderate Onset quality:  Gradual Duration:  12 hours Timing:  Constant Progression:  Unchanged Chronicity:  New Context: at rest   Relieved by:  Nothing Worsened by:  Nothing Ineffective treatments:  Antacids Associated symptoms: back pain and heartburn   Associated symptoms: no abdominal pain, no cough, no diaphoresis, no fever, no headache, no lower extremity edema, no nausea, no shortness of breath and no vomiting   Risk factors: hypertension   Risk factors: not pregnant, no prior DVT/PE and no smoking     Past Medical History:  Diagnosis Date  . Anemia    iron def  . Anxiety   . Asthma   . Hypertension   . Obesity, unspecified   . PCOS (polycystic ovarian syndrome)   . Positive ANA (antinuclear antibody) 05/05/2017   done by [facility] in [facility]; ANA 1:160, speckled pattern    Patient Active Problem List   Diagnosis Date Noted  . Generalized anxiety disorder with panic attacks 06/20/2018  . Vaginal candidiasis 12/18/2015  . Diarrhea 11/10/2015  . Gastroenteritis 09/24/2015  .  Screen for STD (sexually transmitted disease) 09/21/2015  . Rash and nonspecific skin eruption 08/31/2015  . Dysmenorrhea 08/31/2015  . Allergic rhinitis 05/17/2013  . GERD (gastroesophageal reflux disease) 03/20/2012  . Polycystic ovarian syndrome 12/26/2011  . Abdominal pain 12/26/2011  . Chest pain 11/01/2011  . Family history of cardiac disorder 02/17/2011  . Contraception management 12/30/2010  . 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 Essential hypertension 01/27/2010  . ANEMIA, IRON DEFICIENCY 06/27/2009  . Obesity 03/10/2006  . ASTHMA, INTERMITTENT 03/10/2006  . ECZEMA, ATOPIC DERMATITIS 03/10/2006    Past Surgical History:  Procedure Laterality Date  . TONSILLECTOMY       OB History    Gravida  0   Para      Term      Preterm      AB      Living        SAB      TAB      Ectopic      Multiple      Live Births               Home Medications    Prior to Admission medications   Medication Sig Start Date End Date Taking? Authorizing Provider  atenolol (TENORMIN) 25 MG tablet Take 1 tablet (25 mg total) by mouth daily. You may take an extra tablet as  needed for palpitations 08/04/18   Quintella Reicherturner, Traci R, MD  lisinopril-hydrochlorothiazide (ZESTORETIC) 20-25 MG tablet Take 1 tablet by mouth daily. 08/10/18   Quintella Reicherturner, Traci R, MD  naproxen (NAPROSYN) 375 MG tablet Take 1 tablet twice daily as needed for chest wall pain. 08/10/18   Molpus, John, MD  omeprazole (PRILOSEC) 40 MG capsule Take 40 mg by mouth daily.    [provider]  PARoxetine (PAXIL) 20 MG tablet Take 1 tablet (20 mg total) by mouth daily. 07/27/18 01/23/19  Pucilowski, Roosvelt Maserlgierd A, MD  cetirizine (ZYRTEC) 10 MG tablet Take 1 tablet (10 mg total) by mouth at bedtime. To help with head pressure/congestion 06/23/18 07/16/18  Fulp, Hewitt Shortsammie, MD  ferrous sulfate 325 (65 FE) MG tablet Take 1 tablet (325 mg total) by mouth daily with breakfast. TAKE WITH ORANGE JUICE Patient not  taking: Reported on 06/15/2018 04/28/18 07/16/18  Street, [place], PA-C    Family History Family History  Problem Relation Age of Onset  . Cancer Father 10052       esophagus  . Asthma Father   . Esophageal cancer Father   . Hypertension Mother   . Lupus Mother   . Diabetes Mother   . Cancer Maternal Uncle 40       pancreatic cancer  . Cancer Paternal Grandmother 9060       esophageal  . Anxiety disorder Paternal Aunt   . Depression Paternal Aunt   . Anxiety disorder Cousin   . Colon cancer Neg Hx   . Ulcerative colitis Neg Hx     Social History Social History   Tobacco Use  . Smoking status: Never Smoker  . Smokeless tobacco: Never Used  Substance Use Topics  . Alcohol use: Yes    Comment: occasional  . Drug use: Not Currently    Types: Marijuana     Allergies   Diflucan [fluconazole]   Review of Systems Review of Systems  Constitutional: Negative for diaphoresis and fever.  HENT: Negative for sore throat.   Eyes: Negative for visual disturbance.  Respiratory: Negative for cough and shortness of breath.   Cardiovascular: Positive for chest pain.  Gastrointestinal: Positive for heartburn. Negative for abdominal pain, nausea and vomiting.  Genitourinary: Negative for dysuria.  Musculoskeletal: Positive for back pain.  Skin: Negative for rash.  Neurological: Negative for headaches.     Physical Exam Updated Vital Signs BP 125/71 (BP Location: Right Arm)   Pulse 83   Resp 16   Ht 5\' 11"  (1.803 m)   Wt 99.8 kg   LMP 09/07/2018   SpO2 100%   BMI 30.68 kg/m   Physical Exam Vitals signs and nursing note reviewed.  Constitutional:      General: She is not in acute distress.    Appearance: She is well-developed.  HENT:     Head: Normocephalic and atraumatic.  Eyes:     Conjunctiva/sclera: Conjunctivae normal.  Neck:     Musculoskeletal: Neck supple.  Cardiovascular:     Rate and Rhythm: Normal rate and regular rhythm.     Heart sounds: Normal heart  sounds. No murmur.  Pulmonary:     Effort: Pulmonary effort is normal. No respiratory distress.     Breath sounds: Normal breath sounds.  Abdominal:     Palpations: Abdomen is soft.     Tenderness: There is no abdominal tenderness.  Musculoskeletal: Normal range of motion.     Right lower leg: She exhibits no tenderness. No edema.     Left  lower leg: She exhibits no tenderness. No edema.  Skin:    General: Skin is warm and dry.     Capillary Refill: Capillary refill takes less than 2 seconds.  Neurological:     General: No focal deficit present.     Mental Status: She is alert.      ED Treatments / Results  Labs (all labs ordered are listed, but only abnormal results are displayed) Labs Reviewed  CBC WITH DIFFERENTIAL/PLATELET - Abnormal; Notable for the following components:      Result Value   Hemoglobin 10.5 (*)    HCT 34.7 (*)    MCV 78.3 (*)    MCH 23.7 (*)    RDW 16.3 (*)    All other components within normal limits  COMPREHENSIVE METABOLIC PANEL - Abnormal; Notable for the following components:   Glucose, Bld 104 (*)    All other components within normal limits  PREGNANCY, URINE  TROPONIN I (HIGH SENSITIVITY)    EKG EKG Interpretation  Date/Time:  Sunday September 24 2018 08:46:16 EDT Ventricular Rate:  69 PR Interval:    QRS Duration: 99 QT Interval:  400 QTC Calculation: 429 R Axis:   52 Text Interpretation:  Sinus rhythm similar to prior 7/20 Confirmed by Aletta Edouard [phone]) on 09/24/2018 8:53:27 AM   Radiology No results found.  Procedures Procedures (including critical care time)  Medications Ordered in ED Medications  alum & mag hydroxide-simeth (MAALOX/MYLANTA) 200-200-20 MG/5ML suspension 30 mL (30 mLs Oral Given 09/24/18 0905)    And  lidocaine (XYLOCAINE) 2 % viscous mouth solution 15 mL (15 mLs Oral Given 09/24/18 0905)     Initial Impression / Assessment and Plan / ED Course  I have reviewed the triage vital signs and the nursing  notes.  Pertinent labs & imaging results that were available during my care of the patient were reviewed by me and considered in my medical decision making (see chart for details).  Clinical Course as of Sep 24 1651  Sun Sep 23, 1268  8467 28 year old female history of hypertension here with burning chest pain since last evening.  She is not tachycardic tachypneic or hypoxic.  PERC negative.  Initial EKG unchanged from priors and normal.  Trying GI cocktail.  If that does not work we will consider doing further work-up.   [MB]  [phone] Patient states after the GI cocktail that the burning was better but the pain still persisted.  Getting some labs and a troponin and a pregnancy test.   [MB]  1033 Patient's troponin is less than 2.  This is very atypical pains I do not think she needs a repeat troponin.  Will discharge with PCP follow-up.   [MB]    Clinical Course User Index [MB] Hayden Rasmussen, MD       Final Clinical Impressions(s) / ED Diagnoses   Final diagnoses:  Nonspecific chest pain    ED Discharge Orders    None       Hayden Rasmussen, MD 09/24/18 [phone]

## 2018-10-19 ENCOUNTER — Other Ambulatory Visit: Payer: Self-pay

## 2018-10-19 ENCOUNTER — Ambulatory Visit (INDEPENDENT_AMBULATORY_CARE_PROVIDER_SITE_OTHER): Payer: Self-pay | Admitting: Psychiatry

## 2018-10-19 DIAGNOSIS — F411 Generalized anxiety disorder: Secondary | ICD-10-CM

## 2018-10-19 DIAGNOSIS — F410 Panic disorder [episodic paroxysmal anxiety] without agoraphobia: Secondary | ICD-10-CM

## 2018-10-19 NOTE — Progress Notes (Signed)
BH MD/PA/NP OP Progress Note  10/19/2018 3:14 PM Diane Lloyd  MRN:  [id number] Interview was conducted by phone and I verified that I was speaking with the correct person using two identifiers. I discussed the limitations of evaluation and management by telemedicine and  the availability of in person appointments. Patient expressed understanding and agreed to proceed.  Chief Complaint: "I have my days but in general I am in much better place".  HPI: 28 yo single AAF with new onset of generalized anxiety (wories and ruminations about current situation with COVID-19 most of all) and infrequent panic attacks. No clear sx of depression. 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 She has no hx of major depression, mania, psychosis or alcohol/drug addiction. She was briefly in counseling and has never tried psychotropic medications. Panic attacks fully resolved on 20 mg of paroxetine and generalized anxiety symptoms appear to have resolved as well. She no longer uses trazodone for middle insomnia..   Visit Diagnosis:    ICD-10-CM   1. Generalized anxiety disorder with panic attacks  F41.1    F41.0     Past Psychiatric History: Please see intake H&P>  Past Medical History:  Past Medical History:  Diagnosis Date  . Anemia    iron def  . Anxiety   . Asthma   . Hypertension   . Obesity, unspecified   . PCOS (polycystic ovarian syndrome)   . Positive ANA (antinuclear antibody) 05/05/2017   [facility]; ANA 1:160, speckled pattern    Past Surgical History:  Procedure Laterality Date  . TONSILLECTOMY      Family Psychiatric History: Reviewed.  Family History:  Family History  Problem Relation Age of Onset  . Cancer Father 4052       esophagus  . Asthma Father   . Esophageal cancer Father   . Hypertension Mother   . Lupus Mother   . Diabetes Mother   . Cancer Maternal  Uncle 40       pancreatic cancer  . Cancer Paternal Grandmother 1660       esophageal  . Anxiety disorder Paternal Aunt   . Depression Paternal Aunt   . Anxiety disorder Cousin   . Colon cancer Neg Hx   . Ulcerative colitis Neg Hx     Social History:  Social History   Socioeconomic History  . Marital status: Single    Spouse name: Not on file  . Number of children: 0  . Years of education: Not on file  . Highest education level: Some college, no degree  Occupational History  . Occupation: CSR    Employer: [employer]  Social Needs  . Financial resource strain: Not on file  . Food insecurity    Worry: Not on file    Inability: Not on file  . Transportation needs    Medical: Not on file    Non-medical: Not on file  Tobacco Use  . Smoking status: Never Smoker  . Smokeless tobacco: Never Used  Substance and Sexual Activity  . Alcohol use: Yes    Comment: occasional  . Drug use: Not Currently    Types: Marijuana  . Sexual activity: Yes    Birth control/protection: None  Lifestyle  . Physical activity    Days per week: Not on file    Minutes per session: Not on file  . Stress: Not on file  Relationships  .  Social Musician on phone: Not on file    Gets together: Not on file    Attends religious service: Not on file    Active member of club or organization: Not on file    Attends meetings of clubs or organizations: Not on file    Relationship status: Not on file  Other Topics Concern  . Not on file  Social History Narrative   Patient is right-handed. She lives with her mother and uncle in a one level home. She occasionally drinks caffeine. She does not exercise.    Allergies:  Allergies  Allergen Reactions  . Diflucan [Fluconazole] Itching and Rash    Metabolic Disorder Labs: Lab Results  Component Value Date   HGBA1C 5.0 02/06/2018   Lab Results  Component Value Date   PROLACTIN 10.7 10/05/2011   Lab Results  Component Value Date   CHOL 135  12/12/2015   TRIG 158 (H) 12/12/2015   HDL 36 (L) 12/12/2015   CHOLHDL 3.8 12/12/2015   VLDL 32 (H) 12/12/2015   LDLCALC 67 12/12/2015   LDLCALC 67 03/17/2011   Lab Results  Component Value Date   TSH 0.927 02/06/2018   TSH 0.555 12/20/2017    Therapeutic Level Labs: No results found for: LITHIUM No results found for: VALPROATE No components found for:  CBMZ  Current Medications: Current Outpatient Medications  Medication Sig Dispense Refill  . atenolol (TENORMIN) 25 MG tablet Take 1 tablet (25 mg total) by mouth daily. You may take an extra tablet as needed for palpitations 45 tablet 6  . lisinopril-hydrochlorothiazide (ZESTORETIC) 20-25 MG tablet Take 1 tablet by mouth daily. 90 tablet 1  . naproxen (NAPROSYN) 375 MG tablet Take 1 tablet twice daily as needed for chest wall pain. 15 tablet 0  . omeprazole (PRILOSEC) 40 MG capsule Take 40 mg by mouth daily.    Marland Kitchen PARoxetine (PAXIL) 20 MG tablet Take 1 tablet (20 mg total) by mouth daily. 90 tablet 1   No current facility-administered medications for this visit.       Psychiatric Specialty Exam: Review of Systems  All other systems reviewed and are negative.   There were no vitals taken for this visit.There is no height or weight on file to calculate BMI.  General Appearance: NA  Eye Contact:  NA  Speech:  Clear and Coherent and Normal Rate  Volume:  Normal  Mood:  Euthymic  Affect:  NA  Thought Process:  Goal Directed and Linear  Orientation:  Full (Time, Place, and Person)  Thought Content: Logical   Suicidal Thoughts:  No  Homicidal Thoughts:  No  Memory:  Immediate;   Good Recent;   Good Remote;   Good  Judgement:  Good  Insight:  Good  Psychomotor Activity:  NA  Concentration:  Concentration: Good  Recall:  Good  Fund of Knowledge: Good  Language: Good  Akathisia:  Negative  Handed:  Right  AIMS (if indicated): not done  Assets:  Communication Skills Desire for Improvement Financial  Resources/Insurance Housing Physical Health Vocational/Educational  ADL's:  Intact  Cognition: WNL  Sleep:  Fair   Screenings: GAD-7     Office Visit from 06/20/2018 in [facility] Office Visit from 06/15/2018 in [facility] Office Visit from 06/06/2018 in [facility] Office Visit from 04/28/2018 in [facility] Office Visit from 03/06/2018 in [facility]  Total GAD-7 Score  5  15  5  18  19     PHQ2-9     Office Visit from 06/20/2018 in [place] Office Visit from 06/15/2018 in [place] Office Visit from 06/06/2018 in [place] Office Visit from 04/28/2018 in [place] Office Visit from 03/06/2018 in [place]  PHQ-2 Total Score  0  2  2  0  4  PHQ-9 Total Score  1  3  2   0  10       Assessment and Plan: 28 yo single AAF with new onset of generalized anxiety (wories and ruminations about current situation with COVID-19 most of all) and infrequent panic attacks. No clear sx of depression. 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 She has no hx of major depression, mania, psychosis or alcohol/drug addiction. She was briefly in counseling and has never tried psychotropic medications. Panic attacks fully resolved on 20 mg of paroxetine and generalized anxiety symptoms appear to have resolved as well. She no longer uses trazodone for middle insomnia.Marland Kitchen   Dx: GAD  Plan: Continue paroxetine 20 mg daily. Next appointment in 3 months or prn. The plan was discussed with patient who had an opportunity to ask questions and these were all answered. I spend 25 minutes in phone consultation with the patient.     Stephanie Acre, MD 10/19/2018, 3:14 PM

## 2018-10-30 ENCOUNTER — Other Ambulatory Visit: Payer: Self-pay | Admitting: Family Medicine

## 2018-11-01 ENCOUNTER — Telehealth (HOSPITAL_COMMUNITY): Payer: Self-pay

## 2018-11-01 NOTE — Telephone Encounter (Signed)
Patient is calling to request something for sleep. Patient states that she can go to sleep, but she is not staying asleep.

## 2018-11-05 ENCOUNTER — Encounter (HOSPITAL_BASED_OUTPATIENT_CLINIC_OR_DEPARTMENT_OTHER): Payer: Self-pay | Admitting: Emergency Medicine

## 2018-11-05 ENCOUNTER — Emergency Department (HOSPITAL_BASED_OUTPATIENT_CLINIC_OR_DEPARTMENT_OTHER)
Admission: EM | Admit: 2018-11-05 | Discharge: 2018-11-05 | Disposition: A | Discharge status: Home / Self Care | Payer: Medicaid Other | Attending: Emergency Medicine | Admitting: Emergency Medicine

## 2018-11-05 ENCOUNTER — Other Ambulatory Visit: Payer: Self-pay

## 2018-11-05 DIAGNOSIS — J45909 Unspecified asthma, uncomplicated: Secondary | ICD-10-CM | POA: Insufficient documentation

## 2018-11-05 DIAGNOSIS — I10 Essential (primary) hypertension: Secondary | ICD-10-CM | POA: Insufficient documentation

## 2018-11-05 DIAGNOSIS — Z79899 Other long term (current) drug therapy: Secondary | ICD-10-CM | POA: Insufficient documentation

## 2018-11-05 DIAGNOSIS — Z888 Allergy status to other drugs, medicaments and biological substances status: Secondary | ICD-10-CM | POA: Insufficient documentation

## 2018-11-05 DIAGNOSIS — R200 Anesthesia of skin: Secondary | ICD-10-CM | POA: Insufficient documentation

## 2018-11-05 DIAGNOSIS — R202 Paresthesia of skin: Secondary | ICD-10-CM | POA: Insufficient documentation

## 2018-11-05 MED ORDER — METHYLPREDNISOLONE 4 MG PO TBPK: ORAL_TABLET | ORAL | 0 refills | Status: DC

## 2018-11-05 NOTE — ED Triage Notes (Signed)
Intermittent R hand numbness and tingling x 3 days. Also c/o intermittent L side chest pain radiating into her back for "awhile". She is followed by cardiology and has a stress test coming up.

## 2018-11-05 NOTE — ED Notes (Signed)
Pt verbalized understanding of dc instructions.

## 2018-11-05 NOTE — ED Provider Notes (Signed)
[facility] EMERGENCY DEPARTMENT Provider Note   CSN: [id number] Arrival date & time: 11/05/18  [id number]     History   Chief Complaint Chief Complaint  Patient presents with  . Chest Pain  . Numbness    HPI Diane Lloyd is a 28 y.o. female.     HPI Patient reports she is getting tingling sensation and sometimes numbness in her right arm and hand.  It has been present for 3 days.  She reports that lots of times is mostly a numb feeling on the back of her hand.  No headache or visual change.  Patient reports that she is also sometimes noticing a burning quality pain in her chest that sometimes goes to her back.  This is something has come and gone for quite a while.  Patient has also had problems off and on for years with all over body tingling sensations and tingling and numbness feeling in her feet that is variable.  It may be in 1 foot or the other and come and go.  Patient has in the past been seen by neurology and has had an MRI of the brain within approximately the past year.  No acute findings.  She has also had an echocardiogram this year.  Patient has been seen by cardiology.  She has not been sick recently no fevers, no chills.  She reports this morning her blood pressure was pretty elevated up to 170s over 90s.  She took her morning medications of atenolol and lisinopril.  Pressures have normalized.  She reports she is compliant with her medications and typically her blood pressures run 120s over 70s when taking her medication.  Patient is an only child.  Patient's mother is living.  She did not have any early onset coronary artery disease.  If she has had some strokes in her later years.  Patient's father died of esophageal cancer. Past Medical History:  Diagnosis Date  . Anemia    iron def  . Anxiety   . Asthma   . Hypertension   . Obesity, unspecified   . PCOS (polycystic ovarian syndrome)   . Positive ANA (antinuclear antibody) 05/05/2017   [facility]; ANA 1:160, speckled pattern    Patient Active Problem List   Diagnosis Date Noted  . Generalized anxiety disorder with panic attacks 06/20/2018  . Vaginal candidiasis 12/18/2015  . Diarrhea 11/10/2015  . Gastroenteritis 09/24/2015  . Screen for STD (sexually transmitted disease) 09/21/2015  . Rash and nonspecific skin eruption 08/31/2015  . Dysmenorrhea 08/31/2015  . Allergic rhinitis 05/17/2013  . GERD (gastroesophageal reflux disease) 03/20/2012  . Polycystic ovarian syndrome 12/26/2011  . Abdominal pain 12/26/2011  . Chest pain 11/01/2011  . Family history of cardiac disorder 02/17/2011  . Contraception management 12/30/2010  . 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 Essential hypertension 01/27/2010  . ANEMIA, IRON DEFICIENCY 06/27/2009  . Obesity 03/10/2006  . ASTHMA, INTERMITTENT 03/10/2006  . ECZEMA, ATOPIC DERMATITIS 03/10/2006    Past Surgical History:  Procedure Laterality Date  . TONSILLECTOMY       OB History    Gravida  0   Para      Term      Preterm      AB      Living        SAB      TAB      Ectopic      Multiple  Live Births               Home Medications    Prior to Admission medications   Medication Sig Start Date End Date Taking? Authorizing Provider  atenolol (TENORMIN) 25 MG tablet Take 1 tablet (25 mg total) by mouth daily. You may take an extra tablet as needed for palpitations 08/04/18   Quintella Reichert, MD  lisinopril-hydrochlorothiazide (ZESTORETIC) 20-25 MG tablet Take 1 tablet by mouth daily. 08/10/18   Quintella Reichert, MD  methylPREDNISolone (MEDROL DOSEPAK) 4 MG TBPK tablet Take per dose pack instruction 11/05/18   Arby Barrette, MD  naproxen (NAPROSYN) 375 MG tablet Take 1 tablet twice daily as needed for chest wall pain. 08/10/18   Molpus, John, MD  omeprazole (PRILOSEC) 40 MG capsule TAKE 1 CAPSULE BY MOUTH DAILY TO LOWER STOMACH ACID 10/30/18   Fulp, Cammie, MD   PARoxetine (PAXIL) 20 MG tablet Take 1 tablet (20 mg total) by mouth daily. 07/27/18 01/23/19  Pucilowski, Roosvelt Maser, MD  cetirizine (ZYRTEC) 10 MG tablet Take 1 tablet (10 mg total) by mouth at bedtime. To help with head pressure/congestion 06/23/18 07/16/18  Fulp, Hewitt Shorts, MD  ferrous sulfate 325 (65 FE) MG tablet Take 1 tablet (325 mg total) by mouth daily with breakfast. TAKE WITH ORANGE JUICE Patient not taking: Reported on 06/15/2018 04/28/18 07/16/18  Street, [place], PA-C    Family History Family History  Problem Relation Age of Onset  . Cancer Father 50       esophagus  . Asthma Father   . Esophageal cancer Father   . Hypertension Mother   . Lupus Mother   . Diabetes Mother   . Cancer Maternal Uncle 40       pancreatic cancer  . Cancer Paternal Grandmother 15       esophageal  . Anxiety disorder Paternal Aunt   . Depression Paternal Aunt   . Anxiety disorder Cousin   . Colon cancer Neg Hx   . Ulcerative colitis Neg Hx     Social History Social History   Tobacco Use  . Smoking status: Never Smoker  . Smokeless tobacco: Never Used  Substance Use Topics  . Alcohol use: Yes    Comment: occasional  . Drug use: Not Currently    Types: Marijuana     Allergies   Diflucan [fluconazole]   Review of Systems Review of Systems 10 Systems reviewed and are negative for acute change except as noted in the HPI.   Physical Exam Updated Vital Signs BP (!) 147/90 (BP Location: Right Arm)   Pulse 89   Temp 99.1 F (37.3 C) (Oral)   Resp 18   Ht 5\' 11"  (1.803 m)   Wt 104.3 kg   LMP 10/06/2018   SpO2 100%   BMI 32.08 kg/m   Physical Exam Constitutional:      Comments: Patient is alert nontoxic and clinically well in appearance.  HENT:     Head: Normocephalic and atraumatic.     Nose: Nose normal.     Mouth/Throat:     Mouth: Mucous membranes are moist.     Pharynx: Oropharynx is clear.  Eyes:     Extraocular Movements: Extraocular movements intact.      Conjunctiva/sclera: Conjunctivae normal.     Pupils: Pupils are equal, round, and reactive to light.  Neck:     Musculoskeletal: Normal range of motion and neck supple.  Cardiovascular:     Rate and Rhythm: Normal rate and regular  rhythm.     Pulses: Normal pulses.     Heart sounds: Normal heart sounds.  Pulmonary:     Effort: Pulmonary effort is normal.     Breath sounds: Normal breath sounds.  Abdominal:     General: There is no distension.     Palpations: Abdomen is soft.     Tenderness: There is no abdominal tenderness.  Musculoskeletal: Normal range of motion.        General: No swelling or tenderness.     Right lower leg: No edema.     Left lower leg: No edema.     Comments: Soft tissues and pulses of the right upper extremity are normal.  Lower extremities are normal without any calf tenderness or peripheral edema.  Feet are in good condition, warm and dry.  Skin:    General: Skin is warm and dry.  Neurological:     General: No focal deficit present.     Mental Status: She is oriented to person, place, and time.     Cranial Nerves: No cranial nerve deficit.     Sensory: No sensory deficit.     Motor: No weakness.     Coordination: Coordination normal.     Comments: Upper extremity strength is normal.  Patient has excellent resistance to downward pressure with arms abducted at 90 degrees.  Normal strength to resistance at biceps flexion and extension.  Grip strength 5\5 symmetric.  No pronator drift.  Patient sensation to light touch is symmetric over both upper extremities.  Psychiatric:        Mood and Affect: Mood normal.      ED Treatments / Results  Labs (all labs ordered are listed, but only abnormal results are displayed) Labs Reviewed - No data to display  EKG EKG Interpretation  Date/Time:  Sunday November 05 2018 07:36:36 EDT Ventricular Rate:  85 PR Interval:    QRS Duration: 101 QT Interval:  351 QTC Calculation: 418 R Axis:   33 Text  Interpretation:  Sinus rhythm normal, no change Confirmed by Charlesetta Shanks [phone]) on 11/05/2018 8:06:18 AM   Radiology No results found.  Procedures Procedures (including critical care time)  Medications Ordered in ED Medications - No data to display   Initial Impression / Assessment and Plan / ED Course  I have reviewed the triage vital signs and the nursing notes.  Pertinent labs & imaging results that were available during my care of the patient were reviewed by me and considered in my medical decision making (see chart for details).       Patient has had variable symptoms of tingling, numbness and paresthesias.  This is been in different patterns over a number of years.  Patient has had an MRI April last year that did not show any abnormality, notably no demyelinating appearance.  She also has had an echocardiogram 5 months ago showing no structural anomalies.  Patient is scheduled to get a stress test this upcoming week.  At this time, I have low suspicion for acute CVA.  Sensation and motor function are normal.  Paresthesias are subjective and most noticeable in the back of the hand.  Possible nerve impingement type syndrome.  Will do a trial of a Medrol Dosepak with follow-up with her PCP this week for reassessment for response to treatment.  I do not have high suspicion for cardiac ischemic presentation.  Patient is getting a stress test this week and is followed by cardiology.  Return precautions provided.  Final  Clinical Impressions(s) / ED Diagnoses   Final diagnoses:  Paresthesia  Numbness    ED Discharge Orders         Ordered    methylPREDNISolone (MEDROL DOSEPAK) 4 MG TBPK tablet     11/05/18 4782           Arby Barrette, MD 11/05/18 [phone]

## 2018-11-06 ENCOUNTER — Other Ambulatory Visit (HOSPITAL_COMMUNITY): Payer: Self-pay | Admitting: Psychiatry

## 2018-11-06 ENCOUNTER — Other Ambulatory Visit (HOSPITAL_COMMUNITY)
Admission: RE | Admit: 2018-11-06 | Discharge: 2018-11-06 | Disposition: A | Discharge status: Home / Self Care | Payer: Medicaid Other | Source: Ambulatory Visit | Attending: Cardiology | Admitting: Cardiology

## 2018-11-06 DIAGNOSIS — Z01812 Encounter for preprocedural laboratory examination: Secondary | ICD-10-CM | POA: Insufficient documentation

## 2018-11-06 DIAGNOSIS — Z20828 Contact with and (suspected) exposure to other viral communicable diseases: Secondary | ICD-10-CM | POA: Insufficient documentation

## 2018-11-06 MED ORDER — TRAZODONE HCL 100 MG PO TABS: 100.0000 mg | ORAL_TABLET | Freq: Every evening | ORAL | 0 refills | Status: DC | PRN

## 2018-11-06 NOTE — Telephone Encounter (Signed)
She told me she now sleeps OK - seems this is not the case. She was in the past on trazodone so I reordered it.

## 2018-11-07 ENCOUNTER — Emergency Department (HOSPITAL_BASED_OUTPATIENT_CLINIC_OR_DEPARTMENT_OTHER)
Admission: EM | Admit: 2018-11-07 | Discharge: 2018-11-08 | Disposition: A | Discharge status: Home / Self Care | Payer: Medicaid Other | Attending: Emergency Medicine | Admitting: Emergency Medicine

## 2018-11-07 ENCOUNTER — Other Ambulatory Visit: Payer: Self-pay

## 2018-11-07 ENCOUNTER — Telehealth (HOSPITAL_COMMUNITY): Payer: Self-pay

## 2018-11-07 ENCOUNTER — Encounter (HOSPITAL_BASED_OUTPATIENT_CLINIC_OR_DEPARTMENT_OTHER): Payer: Self-pay

## 2018-11-07 DIAGNOSIS — I10 Essential (primary) hypertension: Secondary | ICD-10-CM | POA: Insufficient documentation

## 2018-11-07 DIAGNOSIS — Z79899 Other long term (current) drug therapy: Secondary | ICD-10-CM | POA: Insufficient documentation

## 2018-11-07 DIAGNOSIS — M79605 Pain in left leg: Secondary | ICD-10-CM | POA: Insufficient documentation

## 2018-11-07 DIAGNOSIS — J45909 Unspecified asthma, uncomplicated: Secondary | ICD-10-CM | POA: Insufficient documentation

## 2018-11-07 LAB — NOVEL CORONAVIRUS, NAA (HOSP ORDER, SEND-OUT TO REF LAB; TAT 18-24 HRS): SARS-CoV-2, NAA: NOT DETECTED

## 2018-11-07 MED ORDER — CYCLOBENZAPRINE HCL 10 MG PO TABS: 10.0000 mg | ORAL_TABLET | Freq: Two times a day (BID) | ORAL | 0 refills | Status: DC | PRN

## 2018-11-07 MED ORDER — KETOROLAC TROMETHAMINE 60 MG/2ML IM SOLN
30.0000 mg | Freq: Once | INTRAMUSCULAR | Status: AC
  Administered 2018-11-07: 30 mg via INTRAMUSCULAR
  Filled 2018-11-07: qty 2

## 2018-11-07 NOTE — ED Provider Notes (Signed)
Emergency Department Provider Note   I have reviewed the triage vital signs and the nursing notes.   HISTORY  Chief Complaint Leg Pain   HPI Diane Lloyd is a 28 y.o. female who presents the emerge department today with unprovoked left calf cramp.  Patient states that this started earlier in the day she took a couple Tylenol did not get better so she came here out of concern for DVT.  States she had muscle cramps before.  She states the pain is better now than when she got here.  She also states it hurts worse with walking.  Of note she was here for chest pain a couple days ago but does not have this at this time.  She has no shortness of breath this time.  She has no history of DVTs, recent surgeries or long car rides.   No other associated or modifying symptoms.    Past Medical History:  Diagnosis Date  . Anemia    iron def  . Anxiety   . Asthma   . Hypertension   . Obesity, unspecified   . PCOS (polycystic ovarian syndrome)   . Positive ANA (antinuclear antibody) 05/05/2017   done by [facility] in [facility]; ANA 1:160, speckled pattern    Patient Active Problem List   Diagnosis Date Noted  . Generalized anxiety disorder with panic attacks 06/20/2018  . Vaginal candidiasis 12/18/2015  . Diarrhea 11/10/2015  . Gastroenteritis 09/24/2015  . Screen for STD (sexually transmitted disease) 09/21/2015  . Rash and nonspecific skin eruption 08/31/2015  . Dysmenorrhea 08/31/2015  . Allergic rhinitis 05/17/2013  . GERD (gastroesophageal reflux disease) 03/20/2012  . Polycystic ovarian syndrome 12/26/2011  . Abdominal pain 12/26/2011  . Chest pain 11/01/2011  . Family history of cardiac disorder 02/17/2011  . Contraception management 12/30/2010  . 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 Essential hypertension 01/27/2010  . ANEMIA, IRON DEFICIENCY 06/27/2009  . Obesity 03/10/2006  . ASTHMA, INTERMITTENT 03/10/2006  . ECZEMA, ATOPIC  DERMATITIS 03/10/2006    Past Surgical History:  Procedure Laterality Date  . TONSILLECTOMY      Current Outpatient Rx  . Order #: [id number] Class: Normal  . Order #: [id number] Class: Normal  . Order #: [id number] Class: Normal  . Order #: [id number] Class: Normal  . Order #: [id number] Class: Print  . Order #: [id number] Class: Normal  . Order #: [id number] Class: Normal  . Order #: [id number] Class: Normal    Allergies Diflucan [fluconazole]  Family History  Problem Relation Age of Onset  . Cancer Father 67       esophagus  . Asthma Father   . Esophageal cancer Father   . Hypertension Mother   . Lupus Mother   . Diabetes Mother   . Cancer Maternal Uncle 40       pancreatic cancer  . Cancer Paternal Grandmother 61       esophageal  . Anxiety disorder Paternal Aunt   . Depression Paternal Aunt   . Anxiety disorder Cousin   . Colon cancer Neg Hx   . Ulcerative colitis Neg Hx     Social History Social History   Tobacco Use  . Smoking status: Never Smoker  . Smokeless tobacco: Never Used  Substance Use Topics  . Alcohol use: Yes    Comment: occasional  . Drug use: Not Currently    Review of Systems  All other systems negative except as documented in the HPI. All pertinent positives and negatives as reviewed in the  HPI. ____________________________________________   PHYSICAL EXAM:  VITAL SIGNS: ED Triage Vitals  Enc Vitals Group     BP 11/07/18 2158 123/66     Pulse Rate 11/07/18 2158 79     Resp 11/07/18 2158 18     Temp 11/07/18 2158 98.7 F (37.1 C)     Temp Source 11/07/18 2158 Oral     SpO2 11/07/18 2158 100 %     Weight 11/07/18 2159 229 lb (103.9 kg)     Height 11/07/18 2159 5\' 11"  (1.803 m)     Head Circumference --      Peak Flow --      Pain Score 11/07/18 2155 7     Pain Loc --      Pain Edu? --      Excl. in [place]? --     Constitutional: Alert and oriented. Well appearing and in no acute distress. Eyes: Conjunctivae are normal. PERRL. EOMI.  Head: Atraumatic. Nose: No congestion/rhinnorhea. Mouth/Throat: Mucous membranes are moist.  Oropharynx non-erythematous. Neck: No stridor.  No meningeal signs.   Cardiovascular: Normal rate, regular rhythm. Good peripheral circulation. Grossly normal heart sounds.   Respiratory: Normal respiratory effort.  No retractions. Lungs CTAB. Gastrointestinal: Soft and nontender. No distention.  Musculoskeletal: No lower extremity tenderness nor edema. No gross deformities of extremities.  Tenderness to palpation left calf worse with dorsiflexion of the left foot.  No obvious edema.  Pulses intact.  Strength is intact. Neurologic:  Normal speech and language. No gross focal neurologic deficits are appreciated.  Skin:  Skin is warm, dry and intact. No rash noted.   ____________________________________________   INITIAL IMPRESSION / ASSESSMENT AND PLAN / ED COURSE  Likely muscular in nature but will come back tomorrow for DVT study.  Low suspicion for DVT so will not prophylactically give Lovenox at this time.  Low suspicion for pulmonary embolus with normal vital signs being PERC negative.   Pertinent labs & imaging results that were available during my care of the patient were reviewed by me and considered in my medical decision making (see chart for details).  A medical screening exam was performed and I feel the patient has had an appropriate workup for their chief complaint at this time and likelihood of emergent condition existing is low. They have been counseled on decision, discharge, follow up and which symptoms necessitate immediate return to the emergency department. They or their family verbally stated understanding and agreement with plan and discharged in stable condition.   ____________________________________________  FINAL CLINICAL IMPRESSION(S) / ED DIAGNOSES  Final diagnoses:  Left leg pain     MEDICATIONS GIVEN DURING THIS VISIT:  Medications  ketorolac (TORADOL)  injection 30 mg (30 mg Intramuscular Given 11/07/18 2356)     NEW OUTPATIENT MEDICATIONS STARTED DURING THIS VISIT:  New Prescriptions   CYCLOBENZAPRINE (FLEXERIL) 10 MG TABLET    Take 1 tablet (10 mg total) by mouth 2 (two) times daily as needed for muscle spasms.    Note:  This note was prepared with assistance of Dragon voice recognition software. Occasional wrong-word or sound-a-like substitutions may have occurred due to the inherent limitations of voice recognition software.   Merrily Pew, MD 11/07/18 [phone]

## 2018-11-07 NOTE — ED Triage Notes (Signed)
Pt c/o pain to left calf-started yesterday-denies injury-NAD-steady gait

## 2018-11-07 NOTE — Telephone Encounter (Signed)
Encounter complete. 

## 2018-11-08 ENCOUNTER — Telehealth (HOSPITAL_COMMUNITY): Payer: Self-pay

## 2018-11-08 ENCOUNTER — Ambulatory Visit (HOSPITAL_BASED_OUTPATIENT_CLINIC_OR_DEPARTMENT_OTHER)
Admission: RE | Admit: 2018-11-08 | Discharge: 2018-11-08 | Disposition: A | Discharge status: Home / Self Care | Payer: Self-pay | Source: Ambulatory Visit | Attending: Emergency Medicine | Admitting: Emergency Medicine

## 2018-11-08 DIAGNOSIS — M79662 Pain in left lower leg: Secondary | ICD-10-CM | POA: Insufficient documentation

## 2018-11-08 NOTE — Telephone Encounter (Signed)
Encounter complete. 

## 2018-11-09 ENCOUNTER — Other Ambulatory Visit: Payer: Self-pay

## 2018-11-09 ENCOUNTER — Ambulatory Visit (HOSPITAL_COMMUNITY)
Admission: RE | Admit: 2018-11-09 | Discharge: 2018-11-09 | Disposition: A | Discharge status: Home / Self Care | Payer: Self-pay | Source: Ambulatory Visit | Attending: Cardiology | Admitting: Cardiology

## 2018-11-09 DIAGNOSIS — R079 Chest pain, unspecified: Secondary | ICD-10-CM

## 2018-11-09 LAB — EXERCISE TOLERANCE TEST
Estimated workload: 6.9 METS
Exercise duration (min): 5 min
Exercise duration (sec): 0 s
MPHR: 194 {beats}/min
Peak HR: 162 {beats}/min
Percent HR: 83 %
RPE: 18
Rest HR: 81 {beats}/min

## 2018-11-10 ENCOUNTER — Telehealth: Payer: Self-pay

## 2018-11-10 DIAGNOSIS — R079 Chest pain, unspecified: Secondary | ICD-10-CM

## 2018-11-10 NOTE — Telephone Encounter (Signed)
-----   Message from Diane Margarita, MD sent at 11/09/2018  5:34 PM EDT ----- No ischemia on stress test but fell short of target HR.  No arrhythmia.  Given her young age it would be unlikely for her to have CAD.  Please get a cardiac MRI to assess for anomalous coronary artery which would be a more likely etiology of cardiac CP in this age group

## 2018-11-10 NOTE — Telephone Encounter (Signed)
Notes recorded by Frederik Schmidt, RN on 11/10/2018 at 8:17 AM EDT  The patient has been notified of the result and verbalized understanding. All questions (if any) were answered.  Frederik Schmidt, RN 11/10/2018 8:17 AM

## 2018-11-15 NOTE — Progress Notes (Deleted)
Patient ID: Diane Lloyd, female   DOB: 14-Jul-1990, 28 y.o.   MRN: [id number] After being seen in the ED for L calf pain R/O DVT 10/27 and 10/28.  Venous U/S was negative for DVT.

## 2018-11-16 ENCOUNTER — Inpatient Hospital Stay: Payer: Medicaid Other

## 2018-11-22 ENCOUNTER — Other Ambulatory Visit: Payer: Self-pay

## 2018-11-22 ENCOUNTER — Encounter: Payer: Self-pay | Admitting: Cardiology

## 2018-11-22 ENCOUNTER — Encounter (HOSPITAL_COMMUNITY): Payer: Self-pay

## 2018-11-22 ENCOUNTER — Telehealth: Payer: Self-pay | Admitting: Family Medicine

## 2018-11-22 ENCOUNTER — Ambulatory Visit (HOSPITAL_COMMUNITY)
Admission: EM | Admit: 2018-11-22 | Discharge: 2018-11-22 | Disposition: A | Discharge status: Home / Self Care | Payer: No Typology Code available for payment source | Attending: Family Medicine | Admitting: Family Medicine

## 2018-11-22 DIAGNOSIS — J029 Acute pharyngitis, unspecified: Secondary | ICD-10-CM

## 2018-11-22 LAB — POCT RAPID STREP A: Streptococcus, Group A Screen (Direct): NEGATIVE

## 2018-11-22 NOTE — Telephone Encounter (Signed)
Patient is having Left jaw pain and sore throat tested negivtive for covid 19 wants a call from the nurse

## 2018-11-22 NOTE — Discharge Instructions (Signed)
You may use over the counter ibuprofen or acetaminophen as needed.  For a sore throat, over the counter products such as [employer] Peroxyl Mouth Sore Rinse or Chloraseptic Sore Throat Spray may provide some temporary relief. Your rapid strep test was negative today. We have sent your throat swab for culture and will let you know of any positive results. 

## 2018-11-22 NOTE — ED Triage Notes (Signed)
Pt states she has a sore throat x 3 days.  

## 2018-11-23 NOTE — ED Provider Notes (Signed)
[facility]   [id number] 11/22/18 Arrival Time: 1937  ASSESSMENT & PLAN:  1. Sore throat     No signs of peritonsillar abscess. Suspect viral etiology. Discussed.  Results for orders placed or performed during the hospital encounter of 11/22/18  POCT rapid strep A [facility])  Result Value Ref Range   Streptococcus, Group A Screen (Direct) NEGATIVE NEGATIVE   Labs Reviewed  CULTURE, GROUP A STREP [facility])  POCT RAPID STREP A     Discharge Instructions      You may use over the counter ibuprofen or acetaminophen as needed.   For a sore throat, over the counter products such as [employer]gate Peroxyl Mouth Sore Rinse or Chloraseptic Sore Throat Spray may provide some temporary relief.  Your rapid strep test was negative today. We have sent your throat swab for culture and will let you know of any positive results.   Reviewed expectations re: course of current medical issues. Questions answered. Outlined signs and symptoms indicating need for more acute intervention. Patient verbalized understanding. After Visit Summary given.   SUBJECTIVE:  Diane Lloyd is a 28 y.o. female who reports a sore throat. Describes as discomfort, sharp, with swallowing; L>R. Onset gradual beginning 3 days ago. Symptoms have progressed to a point and plateaued since beginning; without voice changes. No respiratory symptoms. Normal PO intake but reports discomfort with swallowing. No specific alleviating factors. Fever reported: Fever: absent. No neck pain or swelling. No associated nausea, vomiting, or abdominal pain. Known sick contacts: none. Recent travel: none. OTC treatment: Tylenol with mild relief.  ROS: As per HPI. All other systems negative.    OBJECTIVE:  Vitals:   11/22/18 1943 11/22/18 1944  BP:  124/68  Pulse:  72  Resp:  18  Temp:  99.4 F (37.4 C)  TempSrc:  Oral  SpO2:  100%  Weight: 99.8 kg      General appearance: alert; no distress HEENT: throat with  mild erythema/cobblestoning; no tonsillar hypertrophy or exudates; uvula is midline Neck: supple with FROM; no lymphadenopathy CV: RRR Lungs: clear to auscultation bilaterally Abd: soft; non-tender Skin: reveals no rash; warm and dry Neuro: normal gait Psychological: alert and cooperative; normal mood and affect  Allergies  Allergen Reactions  . Diflucan [Fluconazole] Itching and Rash    Past Medical History:  Diagnosis Date  . Anemia    iron def  . Anxiety   . Asthma   . Hypertension   . Obesity, unspecified   . PCOS (polycystic ovarian syndrome)   . Positive ANA (antinuclear antibody) 05/05/2017   done by [facility] in [facility]; ANA 1:160, speckled pattern   Social History   Socioeconomic History  . Marital status: Single    Spouse name: Not on file  . Number of children: 0  . Years of education: Not on file  . Highest education level: Some college, no degree  Occupational History  . Occupation: CSR    Employer: [employer]  Social Needs  . Financial resource strain: Not on file  . Food insecurity    Worry: Not on file    Inability: Not on file  . Transportation needs    Medical: Not on file    Non-medical: Not on file  Tobacco Use  . Smoking status: Never Smoker  . Smokeless tobacco: Never Used  Substance and Sexual Activity  . Alcohol use: Yes    Comment: occasional  . Drug use: Not Currently  . Sexual activity: Yes  Birth control/protection: None  Lifestyle  . Physical activity    Days per week: Not on file    Minutes per session: Not on file  . Stress: Not on file  Relationships  . Social Musician on phone: Not on file    Gets together: Not on file    Attends religious service: Not on file    Active member of club or organization: Not on file    Attends meetings of clubs or organizations: Not on file    Relationship status: Not on file  . Intimate partner violence    Fear of current or ex partner: Not on file    Emotionally  abused: Not on file    Physically abused: Not on file    Forced sexual activity: Not on file  Other Topics Concern  . Not on file  Social History Narrative   Patient is right-handed. She lives with her mother and uncle in a one level home. She occasionally drinks caffeine. She does not exercise.   Family History  Problem Relation Age of Onset  . Cancer Father 65       esophagus  . Asthma Father   . Esophageal cancer Father   . Hypertension Mother   . Lupus Mother   . Diabetes Mother   . Cancer Maternal Uncle 40       pancreatic cancer  . Cancer Paternal Grandmother 58       esophageal  . Anxiety disorder Paternal Aunt   . Depression Paternal Aunt   . Anxiety disorder Cousin   . Colon cancer Neg Hx   . Ulcerative colitis Neg Hx           Mardella Layman, MD 11/23/18 [phone]

## 2018-11-25 LAB — CULTURE, GROUP A STREP (THRC)

## 2018-11-27 NOTE — Telephone Encounter (Signed)
Can you see if she means flax seed oil? If so then that should be fine for her to take

## 2018-11-27 NOTE — Telephone Encounter (Signed)
Patient states her concerns from previous call have subsided and she feels better. She was seen at [facility]. But she does have another question.    Patient states she was interested in taking [employer].   Blood pressures at home have been 125/76, 98/64, and SBP ranges 115's to 120. She takes her medication everyday.  Please advise.

## 2018-11-29 ENCOUNTER — Telehealth: Payer: Self-pay | Admitting: Family Medicine

## 2018-11-29 ENCOUNTER — Telehealth (HOSPITAL_COMMUNITY): Payer: Self-pay | Admitting: Emergency Medicine

## 2018-11-29 NOTE — Telephone Encounter (Signed)
Pt called to request her vaccine record, pt would like to pick up on OV 11/30/2018 please follow up

## 2018-11-29 NOTE — Progress Notes (Signed)
Patient ID: Diane Lloyd, female   DOB: 15-Oct-1990, 28 y.o.   MRN: [id number] Virtual Visit via Telephone Note  I connected with Diane Lloyd on 11/30/18 at  9:50 AM EST by telephone and verified that I am speaking with the correct person using two identifiers.   I discussed the limitations, risks, security and privacy concerns of performing an evaluation and management service by telephone and the availability of in person appointments. I also discussed with the patient that there may be a patient responsible charge related to this service. The patient expressed understanding and agreed to proceed.  Patient location:  home My Location:  [facility] office Persons on the call:  Me and the patient    History of Present Illness:  After being seen in the ED 11/22/2018 for ST.  Rapid strep and culture was negative.  She is feeling better as far as ST.  She just got back from travelling to [facility] and went and got another Covid test at [facility] yesterday.  No f/c.  Some nausea.  No vomiting/diarrhea.  Some abdominal cramping.  No urinary s/sx.  No loss of taste or smell. Labs from 09/2018 reviewed and reassuring.  No cough.  No SOB/wheezing  From ED note: Diane Lloyd is a 28 y.o. female who reports a sore throat. Describes as discomfort, sharp, with swallowing; L>R. Onset gradual beginning 3 days ago. Symptoms have progressed to a point and plateaued since beginning; without voice changes. No respiratory symptoms. Normal PO intake but reports discomfort with swallowing. No specific alleviating factors. Fever reported: Fever: absent. No neck pain or swelling. No associated nausea, vomiting, or abdominal pain. Known sick contacts: none. Recent travel: none. OTC treatment: Tylenol with mild relief.  Covid test 10/26 was negative    Observations/Objective:  NAD.  A&Ox3   Assessment and Plan:  1. Nausea - ondansetron (ZOFRAN) 8 MG tablet; Take 1 tablet (8 mg total) by mouth every 8 (eight) hours as  needed for nausea or vomiting.  Dispense: 30 tablet; Refill: 0  2. Encounter for examination following treatment at hospital Awaiting Covid testing again.  Advised quarantine, fluids, rest.  To ED if develops respiratory issues  3. Sore throat resolved     Follow Up Instructions: See PCP in 3 months;  Sooner if needed   I discussed the assessment and treatment plan with the patient. The patient was provided an opportunity to ask questions and all were answered. The patient agreed with the plan and demonstrated an understanding of the instructions.   The patient was advised to call back or seek an in-person evaluation if the symptoms worsen or if the condition fails to improve as anticipated.  I provided 12 minutes of non-face-to-face time during this encounter.   Freeman Caldron, PA-C

## 2018-11-29 NOTE — Telephone Encounter (Signed)
Placed with screener for pick up

## 2018-11-29 NOTE — Telephone Encounter (Signed)
Left message on voicemail with name and callback number Jerrie Gullo RN Navigator Cardiac Imaging [facility] [phone] Office [phone] Cell  

## 2018-11-30 ENCOUNTER — Ambulatory Visit (HOSPITAL_COMMUNITY): Payer: No Typology Code available for payment source

## 2018-11-30 ENCOUNTER — Telehealth (HOSPITAL_COMMUNITY): Payer: Self-pay | Admitting: Emergency Medicine

## 2018-11-30 ENCOUNTER — Ambulatory Visit: Payer: No Typology Code available for payment source | Attending: Family Medicine | Admitting: Physician Assistant

## 2018-11-30 ENCOUNTER — Other Ambulatory Visit: Payer: Self-pay

## 2018-11-30 DIAGNOSIS — J029 Acute pharyngitis, unspecified: Secondary | ICD-10-CM

## 2018-11-30 DIAGNOSIS — Z09 Encounter for follow-up examination after completed treatment for conditions other than malignant neoplasm: Secondary | ICD-10-CM

## 2018-11-30 DIAGNOSIS — R110 Nausea: Secondary | ICD-10-CM

## 2018-11-30 MED ORDER — ONDANSETRON HCL 8 MG PO TABS: 8.0000 mg | ORAL_TABLET | Freq: Three times a day (TID) | ORAL | 0 refills | Status: DC | PRN

## 2018-11-30 NOTE — Telephone Encounter (Signed)
Pt returning phone call regarding upcoming cardiac imaging study; pt verbalizes understanding of appt date/time, parking situation and where to check in,  and verified current allergies; name and call back number provided for further questions should they arise Diane Bond RN Navigator Cardiac Imaging [facility] [phone] office [phone] cell  Pt denies implanted devices, States she is claustrophobic- will reach out to provider for orders

## 2018-12-01 ENCOUNTER — Ambulatory Visit (HOSPITAL_COMMUNITY): Payer: No Typology Code available for payment source

## 2018-12-04 ENCOUNTER — Ambulatory Visit: Payer: No Typology Code available for payment source | Attending: Family Medicine | Admitting: Family Medicine

## 2018-12-04 ENCOUNTER — Other Ambulatory Visit: Payer: Self-pay

## 2018-12-04 DIAGNOSIS — N3000 Acute cystitis without hematuria: Secondary | ICD-10-CM | POA: Diagnosis not present

## 2018-12-04 DIAGNOSIS — R5383 Other fatigue: Secondary | ICD-10-CM

## 2018-12-04 MED ORDER — CEPHALEXIN 500 MG PO CAPS: 500.0000 mg | ORAL_CAPSULE | Freq: Two times a day (BID) | ORAL | 0 refills | Status: DC

## 2018-12-04 NOTE — Progress Notes (Signed)
Virtual Visit via Telephone Note  I connected with Diane Lloyd, on 12/04/2018 at 8:59 AM by telephone due to the COVID-19 pandemic and verified that I am speaking with the correct person using two identifiers.   Consent: I discussed the limitations, risks, security and privacy concerns of performing an evaluation and management service by telephone and the availability of in person appointments. I also discussed with the patient that there may be a patient responsible charge related to this service. The patient expressed understanding and agreed to proceed.   Location of Patient: [place] manager of Provider: Clinic   Persons participating in Telemedicine visit: Hoda D Kalis Friese Farrington-CMA Dr. Alvis Lemmings     History of Present Illness: 28 year old female patient of Dr. Jillyn Hidden with a history of hypertension, PCOS here for an acute visit.   States she feels crappy over all and needs blood work done. Feels fatigued, dizzy, insomnia (sleeps only for 2 hrs) with multiple nighttime awakening, has headaches.  Denies presence of cough, dyspnea, postnasal drip, fever or myalgias.   States she took OTC Monistat for a yeast infection (due to cottage cheese discharge).4 days ago and subsequently developed abdominal pain, frequency, dysuria, but no hematuria.  Past Medical History:  Diagnosis Date  . Anemia    iron def  . Anxiety   . Asthma   . Hypertension   . Obesity, unspecified   . PCOS (polycystic ovarian syndrome)   . Positive ANA (antinuclear antibody) 05/05/2017   done by [facility] in [facility]; ANA 1:160, speckled pattern   Allergies  Allergen Reactions  . Diflucan [Fluconazole] Itching and Rash    Current Outpatient Medications on File Prior to Visit  Medication Sig Dispense Refill  . atenolol (TENORMIN) 25 MG tablet Take 1 tablet (25 mg total) by mouth daily. You may take an extra tablet as needed for palpitations 45 tablet 6  .  lisinopril-hydrochlorothiazide (ZESTORETIC) 20-25 MG tablet Take 1 tablet by mouth daily. 90 tablet 1  . naproxen (NAPROSYN) 375 MG tablet Take 1 tablet twice daily as needed for chest wall pain. 15 tablet 0  . omeprazole (PRILOSEC) 40 MG capsule TAKE 1 CAPSULE BY MOUTH DAILY TO LOWER STOMACH ACID 30 capsule 0  . ondansetron (ZOFRAN) 8 MG tablet Take 1 tablet (8 mg total) by mouth every 8 (eight) hours as needed for nausea or vomiting. 30 tablet 0  . cyclobenzaprine (FLEXERIL) 10 MG tablet Take 1 tablet (10 mg total) by mouth 2 (two) times daily as needed for muscle spasms. (Patient not taking: Reported on 12/04/2018) 10 tablet 0  . methylPREDNISolone (MEDROL DOSEPAK) 4 MG TBPK tablet Take per dose pack instruction (Patient not taking: Reported on 12/04/2018) 21 tablet 0  . PARoxetine (PAXIL) 20 MG tablet Take 1 tablet (20 mg total) by mouth daily. (Patient not taking: Reported on 12/04/2018) 90 tablet 1  . traZODone (DESYREL) 100 MG tablet Take 1 tablet (100 mg total) by mouth at bedtime as needed for sleep. Take 1/2 or 1 tablet at bedtime as needed for sleep. (Patient not taking: Reported on 12/04/2018) 90 tablet 0  . [DISCONTINUED] cetirizine (ZYRTEC) 10 MG tablet Take 1 tablet (10 mg total) by mouth at bedtime. To help with head pressure/congestion 30 tablet 0  . [DISCONTINUED] ferrous sulfate 325 (65 FE) MG tablet Take 1 tablet (325 mg total) by mouth daily with breakfast. TAKE WITH ORANGE JUICE (Patient not taking: Reported on 06/15/2018) 30 tablet 0   No current facility-administered medications  on file prior to visit.     Observations/Objective: Alert, awake, oriented x3 Not in acute distress  Assessment and Plan: 1. Acute cystitis without hematuria We will treat based on symptoms presumptively for UTI Increase fluid intake - cephALEXin (KEFLEX) 500 MG capsule; Take 1 capsule (500 mg total) by mouth 2 (two) times daily.  Dispense: 14 capsule; Refill: 0  2. Fatigue, unspecified  type Encouraged that this could be a viral syndrome Advised to increase fluid intake, rest, use analgesics Would love to order blood work including vitamin D, TSH, CBC and she has been given the option of coming into the clinic for labs or waiting until her follow-up appointment but she would like to wait for this as she would like STD screen as well.   Follow Up Instructions: 3 weeks.   I discussed the assessment and treatment plan with the patient. The patient was provided an opportunity to ask questions and all were answered. The patient agreed with the plan and demonstrated an understanding of the instructions.   The patient was advised to call back or seek an in-person evaluation if the symptoms worsen or if the condition fails to improve as anticipated.     I provided 15 minutes total of non-face-to-face time during this encounter including median intraservice time, reviewing previous notes, labs, imaging, medications, management and patient verbalized understanding.     Charlott Rakes, MD, FAAFP. [facility] [place], [place]   12/04/2018, 8:59 AM

## 2018-12-04 NOTE — Progress Notes (Signed)
Patient has been called and DOB has been verified. Patient has been screened and transferred to PCP to start phone visit.   Patient states that she is having burning while urination.  Patient is requesting lab work due to just feeling tired and not well.

## 2018-12-05 ENCOUNTER — Other Ambulatory Visit: Payer: Self-pay | Admitting: Family Medicine

## 2018-12-05 ENCOUNTER — Encounter: Payer: Self-pay | Admitting: Family Medicine

## 2018-12-06 NOTE — Telephone Encounter (Signed)
Patient aware of the medication recommendations. She also wanted to mention that she has been feeling really crappy lately. Unsure why. Advised to keep appointment on 12/22/2018. If she should have chest pain and SOB or worsening sx's, to go to the UC or ED.

## 2018-12-06 NOTE — Telephone Encounter (Signed)
I could not find black seed oil on up to date. She may want to take the bottle for the supplement to her local pharmacy and ask the pharmacist it this would interact with any of her other medications

## 2018-12-11 MED ORDER — OMEPRAZOLE 40 MG PO CPDR: 40.0000 mg | DELAYED_RELEASE_CAPSULE | Freq: Every day | ORAL | 0 refills | Status: DC

## 2018-12-21 ENCOUNTER — Encounter (HOSPITAL_COMMUNITY): Payer: Self-pay

## 2018-12-21 ENCOUNTER — Ambulatory Visit (HOSPITAL_COMMUNITY)
Admission: EM | Admit: 2018-12-21 | Discharge: 2018-12-21 | Disposition: A | Discharge status: Home / Self Care | Payer: No Typology Code available for payment source | Attending: Family Medicine | Admitting: Family Medicine

## 2018-12-21 ENCOUNTER — Other Ambulatory Visit: Payer: Self-pay

## 2018-12-21 DIAGNOSIS — N949 Unspecified condition associated with female genital organs and menstrual cycle: Secondary | ICD-10-CM

## 2018-12-21 DIAGNOSIS — N9489 Other specified conditions associated with female genital organs and menstrual cycle: Secondary | ICD-10-CM

## 2018-12-21 LAB — POCT URINALYSIS DIP (DEVICE)
Bilirubin Urine: NEGATIVE
Glucose, UA: NEGATIVE mg/dL
Ketones, ur: NEGATIVE mg/dL
Leukocytes,Ua: NEGATIVE
Nitrite: NEGATIVE
Protein, ur: NEGATIVE mg/dL
Specific Gravity, Urine: >=1.03 (ref 1.005–1.030)
Urobilinogen, UA: 1 mg/dL (ref 0.0–1.0)
pH: 6 (ref 5.0–8.0)

## 2018-12-21 NOTE — ED Triage Notes (Signed)
Patient presents to Urgent Care with complaints of burning w/ urination since 3 days ago. Patient reports she developed lower abdominal pain today as well.

## 2018-12-21 NOTE — Discharge Instructions (Addendum)
Keep your appointment with your gyn provider tomorrow morning.

## 2018-12-22 ENCOUNTER — Other Ambulatory Visit (HOSPITAL_COMMUNITY)
Admission: RE | Admit: 2018-12-22 | Discharge: 2018-12-22 | Disposition: A | Discharge status: Home / Self Care | Payer: Medicaid Other | Source: Ambulatory Visit | Attending: Family Medicine | Admitting: Family Medicine

## 2018-12-22 ENCOUNTER — Encounter: Payer: Self-pay | Admitting: Family Medicine

## 2018-12-22 ENCOUNTER — Ambulatory Visit: Payer: Self-pay | Attending: Family Medicine | Admitting: Family Medicine

## 2018-12-22 VITALS — BP 112/73 | HR 70 | Temp 98.2°F | Ht 71.0 in | Wt 235.6 lb

## 2018-12-22 DIAGNOSIS — R1024 Suprapubic pain: Secondary | ICD-10-CM

## 2018-12-22 DIAGNOSIS — Z8249 Family history of ischemic heart disease and other diseases of the circulatory system: Secondary | ICD-10-CM | POA: Insufficient documentation

## 2018-12-22 DIAGNOSIS — D509 Iron deficiency anemia, unspecified: Secondary | ICD-10-CM

## 2018-12-22 DIAGNOSIS — R102 Pelvic and perineal pain: Secondary | ICD-10-CM

## 2018-12-22 DIAGNOSIS — R300 Dysuria: Secondary | ICD-10-CM

## 2018-12-22 DIAGNOSIS — J45909 Unspecified asthma, uncomplicated: Secondary | ICD-10-CM | POA: Insufficient documentation

## 2018-12-22 DIAGNOSIS — R1030 Lower abdominal pain, unspecified: Secondary | ICD-10-CM | POA: Insufficient documentation

## 2018-12-22 DIAGNOSIS — N898 Other specified noninflammatory disorders of vagina: Secondary | ICD-10-CM | POA: Insufficient documentation

## 2018-12-22 DIAGNOSIS — I10 Essential (primary) hypertension: Secondary | ICD-10-CM | POA: Insufficient documentation

## 2018-12-22 DIAGNOSIS — E282 Polycystic ovarian syndrome: Secondary | ICD-10-CM | POA: Insufficient documentation

## 2018-12-22 DIAGNOSIS — Z09 Encounter for follow-up examination after completed treatment for conditions other than malignant neoplasm: Secondary | ICD-10-CM

## 2018-12-22 DIAGNOSIS — R5383 Other fatigue: Secondary | ICD-10-CM

## 2018-12-22 DIAGNOSIS — Z883 Allergy status to other anti-infective agents status: Secondary | ICD-10-CM | POA: Insufficient documentation

## 2018-12-22 DIAGNOSIS — Z79899 Other long term (current) drug therapy: Secondary | ICD-10-CM | POA: Insufficient documentation

## 2018-12-22 NOTE — Patient Instructions (Signed)
Iron Deficiency Anemia, Adult Iron-deficiency anemia is when you have a low amount of red blood cells or hemoglobin. This happens because you have too little iron in your body. Hemoglobin carries oxygen to parts of the body. Anemia can cause your body to not get enough oxygen. It may or may not cause symptoms. Follow these instructions at home: Medicines  Take over-the-counter and prescription medicines only as told by your doctor. This includes iron pills (supplements) and vitamins.  If you cannot handle taking iron pills by mouth, ask your doctor about getting iron through: ? A vein (intravenously). ? A shot (injection) into a muscle.  Take iron pills when your stomach is empty. If you cannot handle this, take them with food.  Do not drink milk or take antacids at the same time as your iron pills.  To prevent trouble pooping (constipation), eat fiber or take medicine (stool softener) as told by your doctor. Eating and drinking   Talk with your doctor before changing the foods you eat. He or she may tell you to eat foods that have a lot of iron, such as: ? Liver. ? Lowfat (lean) beef. ? Breads and cereals that have iron added to them (fortified breads and cereals). ? Eggs. ? Dried fruit. ? Dark green, leafy vegetables.  Drink enough fluid to keep your pee (urine) clear or pale yellow.  Eat fresh fruits and vegetables that are high in vitamin C. They help your body to use iron. Foods with a lot of vitamin C include: ? Oranges. ? Peppers. ? Tomatoes. ? Mangoes. General instructions  Return to your normal activities as told by your doctor. Ask your doctor what activities are safe for you.  Keep yourself clean, and keep things clean around you (your surroundings). Anemia can make you get sick more easily.  Keep all follow-up visits as told by your doctor. This is important. Contact a doctor if:  You feel sick to your stomach (nauseous).  You throw up (vomit).  You feel  weak.  You are sweating for no clear reason.  You have trouble pooping, such as: ? Pooping (having a bowel movement) less than 3 times a week. ? Straining to poop. ? Having poop that is hard, dry, or larger than normal. ? Feeling full or bloated. ? Pain in the lower belly. ? Not feeling better after pooping. Get help right away if:  You pass out (faint). If this happens, do not drive yourself to the hospital. Call your local emergency services (911 in the [place]  You have chest pain.  You have shortness of breath that: ? Is very bad. ? Gets worse with physical activity.  You have a fast heartbeat.  You get light-headed when getting up from sitting or lying down. This information is not intended to replace advice given to you by your health care provider. Make sure you discuss any questions you have with your health care provider. Document Released: 01/30/2010 Document Revised: 12/10/2016 Document Reviewed: 09/17/2015 Elsevier Patient Education  2020 [employer].  Vaginitis  Vaginitis is irritation and swelling (inflammation) of the vagina. It happens when normal bacteria and yeast in the vagina grow too much. There are many types of this condition. Treatment will depend on the type you have. Follow these instructions at home: Lifestyle  Keep your vagina area clean and dry. ? Avoid using soap. ? Rinse the area with water.  Do not do the following until your doctor says it is okay: ? Wash and  clean out the vagina (douche). ? Use tampons. ? Have sex.  Wipe from front to back after going to the bathroom.  Let air reach your vagina. ? Wear cotton underwear. ? Do not wear: ? Underwear while you sleep. ? Tight pants. ? Thong underwear. ? Underwear or nylons without a cotton panel. ? Take off any wet clothing, such as bathing suits, as soon as possible.  Use gentle, non-scented products. Do not use things that can irritate the vagina, such as fabric softeners. Avoid  the following products if they are scented: ? Feminine sprays. ? Detergents. ? Tampons. ? Feminine hygiene products. ? Soaps or bubble baths.  Practice safe sex and use condoms. General instructions  Take over-the-counter and prescription medicines only as told by your doctor.  If you were prescribed an antibiotic medicine, take or use it as told by your doctor. Do not stop taking or using the antibiotic even if you start to feel better.  Keep all follow-up visits as told by your doctor. This is important. Contact a doctor if:  You have pain in your belly.  You have a fever.  Your symptoms last for more than 2-3 days. Get help right away if:  You have a fever and your symptoms get worse all of a sudden. Summary  Vaginitis is irritation and swelling of the vagina. It can happen when the normal bacteria and yeast in the vagina grow too much. There are many types.  Treatment will depend on the type you have.  Do not douche, use tampons , or have sex until your health care provider approves. When you can return to sex, practice safe sex and use condoms. This information is not intended to replace advice given to you by your health care provider. Make sure you discuss any questions you have with your health care provider. Document Released: 03/26/2008 Document Revised: 12/10/2016 Document Reviewed: 01/20/2016 Elsevier Patient Education  2020 [employer].

## 2018-12-22 NOTE — Progress Notes (Signed)
Established Patient Office Visit  Subjective:  Patient ID: Diane Lloyd, female    DOB: February 29, 1990  Age: 28 y.o. MRN: [id number]  CC:  Chief Complaint  Patient presents with  . Gynecologic Exam  Per CMA, patient is requesting pelvic exam for STD testing and labs in follow-up of complaint of fatigue.   HPI Diane Lloyd , 28 year old female, who was seen yesterday at [facility] urgent care on review of chart due to complaint of vaginal burning and patient was seen in office 12/04/2018 with diagnosis of acute cystitis.  Patient's urinalysis from urgent care with trace hemoglobin but negative for nitrites or leukocytes.      At today's visit, patient reports that she continues to have vaginal burning and itching sensation as well as whitish vaginal discharge. She would also like testing for sexually transmitted diseases.  Some does have some mid-lower abdominal discomfort and some burning with urination. She denies current abdominal or pelvic pain. She denies any current possibility of pregnancy and states that her last menses was in the middle of last month. She denies any increased thirst. No fever or chills. No increased back pain. She does have issues with continued fatigue. She has had iron deficiency and vitamin D deficiency in the past. She reports that in the past she was also having some issues with numbness in her hands/fingertips and she was prescribed a pill after some blood work and the numbness went away.   Past Medical History:  Diagnosis Date  . Anemia    iron def  . Anxiety   . Asthma   . Hypertension   . Obesity, unspecified   . PCOS (polycystic ovarian syndrome)   . Positive ANA (antinuclear antibody) 05/05/2017   done by [facility] in [facility]; ANA 1:160, speckled pattern    Past Surgical History:  Procedure Laterality Date  . TONSILLECTOMY      Family History  Problem Relation Age of Onset  . Cancer Father 74       esophagus  . Asthma Father     . Esophageal cancer Father   . Hypertension Mother   . Lupus Mother   . Diabetes Mother   . Cancer Maternal Uncle 40       pancreatic cancer  . Cancer Paternal Grandmother 23       esophageal  . Anxiety disorder Paternal Aunt   . Depression Paternal Aunt   . Anxiety disorder Cousin   . Colon cancer Neg Hx   . Ulcerative colitis Neg Hx     Social History   Socioeconomic History  . Marital status: Single    Spouse name: Not on file  . Number of children: 0  . Years of education: Not on file  . Highest education level: Some college, no degree  Occupational History  . Occupation: CSR    Employer: [employer]  Tobacco Use  . Smoking status: Never Smoker  . Smokeless tobacco: Never Used  Substance and Sexual Activity  . Alcohol use: Yes    Comment: occasional  . Drug use: Not Currently  . Sexual activity: Yes    Birth control/protection: None  Other Topics Concern  . Not on file  Social History Narrative   Patient is right-handed. She lives with her mother and uncle in a one level home. She occasionally drinks caffeine. She does not exercise.   Social Determinants of Health   Financial Resource Strain:   . Difficulty of Paying Living Expenses: Not on  file  Food Insecurity:   . Worried About Programme researcher, broadcasting/film/videounning Out of Food in the Last Year: Not on file  . Ran Out of Food in the Last Year: Not on file  Transportation Needs:   . Lack of Transportation (Medical): Not on file  . Lack of Transportation (Non-Medical): Not on file  Physical Activity:   . Days of Exercise per Week: Not on file  . Minutes of Exercise per Session: Not on file  Stress:   . Feeling of Stress : Not on file  Social Connections:   . Frequency of Communication with Friends and Family: Not on file  . Frequency of Social Gatherings with Friends and Family: Not on file  . Attends Religious Services: Not on file  . Active Member of Clubs or Organizations: Not on file  . Attends BankerClub or Organization Meetings: Not  on file  . Marital Status: Not on file  Intimate Partner Violence:   . Fear of Current or Ex-Partner: Not on file  . Emotionally Abused: Not on file  . Physically Abused: Not on file  . Sexually Abused: Not on file    Outpatient Medications Prior to Visit  Medication Sig Dispense Refill  . atenolol (TENORMIN) 25 MG tablet Take 1 tablet (25 mg total) by mouth daily. You may take an extra tablet as needed for palpitations 45 tablet 6  . lisinopril-hydrochlorothiazide (ZESTORETIC) 20-25 MG tablet Take 1 tablet by mouth daily. 90 tablet 1  . omeprazole (PRILOSEC) 40 MG capsule Take 1 capsule (40 mg total) by mouth daily. 30 capsule 0  . cephALEXin (KEFLEX) 500 MG capsule Take 1 capsule (500 mg total) by mouth 2 (two) times daily. (Patient not taking: Reported on 12/22/2018) 14 capsule 0  . cyclobenzaprine (FLEXERIL) 10 MG tablet Take 1 tablet (10 mg total) by mouth 2 (two) times daily as needed for muscle spasms. (Patient not taking: Reported on 12/04/2018) 10 tablet 0  . methylPREDNISolone (MEDROL DOSEPAK) 4 MG TBPK tablet Take per dose pack instruction (Patient not taking: Reported on 12/04/2018) 21 tablet 0  . naproxen (NAPROSYN) 375 MG tablet Take 1 tablet twice daily as needed for chest wall pain. (Patient not taking: Reported on 12/22/2018) 15 tablet 0  . ondansetron (ZOFRAN) 8 MG tablet Take 1 tablet (8 mg total) by mouth every 8 (eight) hours as needed for nausea or vomiting. (Patient not taking: Reported on 12/22/2018) 30 tablet 0  . PARoxetine (PAXIL) 20 MG tablet Take 1 tablet (20 mg total) by mouth daily. (Patient not taking: Reported on 12/04/2018) 90 tablet 1  . traZODone (DESYREL) 100 MG tablet Take 1 tablet (100 mg total) by mouth at bedtime as needed for sleep. Take 1/2 or 1 tablet at bedtime as needed for sleep. (Patient not taking: Reported on 12/04/2018) 90 tablet 0   No facility-administered medications prior to visit.    Allergies  Allergen Reactions  . Diflucan  [Fluconazole] Itching and Rash    ROS Review of Systems  Constitutional: Positive for fatigue. Negative for chills and fever.  HENT: Negative for sore throat and trouble swallowing.   Eyes: Negative for photophobia and visual disturbance.  Respiratory: Negative for cough and shortness of breath.   Cardiovascular: Negative for chest pain, palpitations and leg swelling.  Gastrointestinal: Positive for abdominal pain. Negative for blood in stool, constipation, diarrhea and nausea.  Endocrine: Negative for cold intolerance, heat intolerance, polydipsia, polyphagia and polyuria.  Genitourinary: Positive for dysuria and vaginal discharge. Negative for flank pain, frequency, menstrual  problem, pelvic pain and vaginal pain.  Musculoskeletal: Negative for arthralgias and back pain.  Skin: Negative for rash and wound.  Neurological: Negative for dizziness and headaches.  Hematological: Negative for adenopathy. Does not bruise/bleed easily.      Objective:    Physical Exam  Constitutional: She is oriented to person, place, and time. She appears well-developed and well-nourished.  WNWD overweight female in NAD wearing mask as per office COVID-19 protocol  Neck: No JVD present. No thyromegaly present.  Cardiovascular: Normal rate and regular rhythm.  Pulmonary/Chest: Effort normal and breath sounds normal.  Abdominal: Soft. There is abdominal tenderness (mild suprapubic discomfort to palpaton). There is no rebound and no guarding.  Genitourinary:    Vaginal discharge present.     Genitourinary Comments: Normal external genitalia; mild erythema of the vaginal canal and clumpy thick whitish discharge in the canal and vaginal vault and patient with discomfort with speculum exam; normal appearance to the cervix and no CMT or adnexal tenderness on exam   Musculoskeletal:        General: No tenderness or edema.     Cervical back: Normal range of motion.     Comments: No CVA tenderness    Lymphadenopathy:    She has no cervical adenopathy.  Neurological: She is alert and oriented to person, place, and time.  Skin: No rash noted.  Psychiatric: She has a normal mood and affect. Her behavior is normal.  Nursing note and vitals reviewed. CMA present for entire exam.  BP 112/73   Pulse 70   Temp 98.2 F (36.8 C) (Oral)   Ht  (1.803 m)   Wt 235 lb 9.6 oz (106.9 kg)   LMP 11/22/2018   SpO2 98%   BMI 32.86 kg/m  Wt Readings from Last 3 Encounters:  12/22/18 235 lb 9.6 oz (106.9 kg)  11/22/18 220 lb (99.8 kg)  11/07/18 229 lb (103.9 kg)     Health Maintenance Due  Topic Date Due  . INFLUENZA VACCINE  08/12/2018    There are no preventive care reminders to display for this patient.  Lab Results  Component Value Date   TSH 0.927 02/06/2018   Lab Results  Component Value Date   WBC 5.7 09/24/2018   HGB 10.5 (L) 09/24/2018   HCT 34.7 (L) 09/24/2018   MCV 78.3 (L) 09/24/2018   PLT 305 09/24/2018   Lab Results  Component Value Date   NA 136 09/24/2018   K 3.8 09/24/2018   CO2 24 09/24/2018   GLUCOSE 104 (H) 09/24/2018   BUN 10 09/24/2018   CREATININE 0.71 09/24/2018   BILITOT 0.5 09/24/2018   ALKPHOS 53 09/24/2018   AST 23 09/24/2018   ALT 31 09/24/2018   PROT 7.3 09/24/2018   ALBUMIN 4.0 09/24/2018   CALCIUM 9.4 09/24/2018   ANIONGAP 11 09/24/2018   Lab Results  Component Value Date   CHOL 135 12/12/2015   Lab Results  Component Value Date   HDL 36 (L) 12/12/2015   Lab Results  Component Value Date   LDLCALC 67 12/12/2015   Lab Results  Component Value Date   TRIG 158 (H) 12/12/2015   Lab Results  Component Value Date   CHOLHDL 3.8 12/12/2015   Lab Results  Component Value Date   HGBA1C 5.0 02/06/2018      Assessment & Plan:  1. Fatigue, unspecified type Patient with complaint of continued issues with fatigue and she has had prior anemia and prior mild increase in glucose.  She will have labs done today in follow-up of  fatigue. Will check Vit D level to look for Vit D deficiency, HGB A1C to look for pre-diabetes, diabetes, comprehensive metabolic panel to look for elevated glucose/electrolyte abnormality, liver enzyme abnormality, vitamin B12 level to look for deficiency, testing for HIV and CBC in follow-up of anemia or to look for other blood disorder that may be causing patient's fatigue. Adequate sleep, healthy diet, exercise an optimal weight may also help with her fatigue.  - Vitamin D, 25-hydroxy - Hemoglobin A1c - Comprehensive metabolic panel - TSH - Vitamin B12 - CBC - HIV antibody (with reflex)  2. Microcytic anemia CBC on 09/24/2018 with Hgb of 10.5 and MCV of 78.3. She is not currently on ferrous sulfate. She will have repeat CBC in follow-up of fatigue and anemia.  - CBC  3. Vaginal discharge Vaginal discharge appears consistent with yeast but patient is allergic to diflucan and is encouraged to use otc Monistat or other otc yeast treatment but she will also be notified of the results of cervicovaginal ancillary testing and if other treatment is needed. She will also have testing for HIV due to vaginal discharge and fatigue. - Cervicovaginal ancillary only - HIV antibody (with reflex)  4. Suprapubic discomfort; 5. Dysuria; Encounter for examination following treatment at hospital Patient with dysuria and suprapubic discomfort. Patient has UA done yesterday at  Urgent care with trace Hgb but otherwise normal. Patient with dysuria which may be related to current vaginal infection which appears to be yeast related. Cervicovaginal ancillary testing done and she will be notified of the results. Urine culture will be done in follow-up of dysuria and suprapubic tenderness. She will be notified if treatment for UTI is needed based on the urine culture results. She cannot take diflucan therefore she is encouraged to try otc Monistat. Go to ED if abdominal pain acutely worsens.  - Urine Culture   An  After Visit Summary was printed and given to the patient.  Follow-up: Return in about 6 weeks (around 02/02/2019) for fatigue; 1-2 weeks if not feeling better.   Cain Saupe, MD

## 2018-12-23 LAB — HEMOGLOBIN A1C
Est. average glucose Bld gHb Est-mCnc: 108 mg/dL
Hgb A1c MFr Bld: 5.4 % (ref 4.8–5.6)

## 2018-12-23 LAB — COMPREHENSIVE METABOLIC PANEL WITH GFR
ALT: 22 IU/L (ref 0–32)
AST: 14 IU/L (ref 0–40)
Albumin/Globulin Ratio: 1.5 (ref 1.2–2.2)
Albumin: 4.5 g/dL (ref 3.9–5.0)
Alkaline Phosphatase: 57 IU/L (ref 39–117)
BUN/Creatinine Ratio: 11 (ref 9–23)
BUN: 10 mg/dL (ref 6–20)
Bilirubin Total: 0.3 mg/dL (ref 0.0–1.2)
CO2: 22 mmol/L (ref 20–29)
Calcium: 9.3 mg/dL (ref 8.7–10.2)
Chloride: 100 mmol/L (ref 96–106)
Creatinine, Ser: 0.9 mg/dL (ref 0.57–1.00)
GFR calc Af Amer: 102 mL/min/1.73
GFR calc non Af Amer: 89 mL/min/1.73
Globulin, Total: 3.1 g/dL (ref 1.5–4.5)
Glucose: 83 mg/dL (ref 65–99)
Potassium: 4 mmol/L (ref 3.5–5.2)
Sodium: 137 mmol/L (ref 134–144)
Total Protein: 7.6 g/dL (ref 6.0–8.5)

## 2018-12-23 LAB — CBC
Hematocrit: 35.2 % (ref 34.0–46.6)
Hemoglobin: 11 g/dL — ABNORMAL LOW (ref 11.1–15.9)
MCH: 24 pg — ABNORMAL LOW (ref 26.6–33.0)
MCHC: 31.3 g/dL — ABNORMAL LOW (ref 31.5–35.7)
MCV: 77 fL — ABNORMAL LOW (ref 79–97)
Platelets: 366 x10E3/uL (ref 150–450)
RBC: 4.59 x10E6/uL (ref 3.77–5.28)
RDW: 15.3 % (ref 11.7–15.4)
WBC: 4.2 x10E3/uL (ref 3.4–10.8)

## 2018-12-23 LAB — VITAMIN B12: Vitamin B-12: 1285 pg/mL — ABNORMAL HIGH (ref 232–1245)

## 2018-12-23 LAB — TSH: TSH: 1.15 u[IU]/mL (ref 0.450–4.500)

## 2018-12-23 LAB — VITAMIN D 25 HYDROXY (VIT D DEFICIENCY, FRACTURES): Vit D, 25-Hydroxy: 28 ng/mL — ABNORMAL LOW (ref 30.0–100.0)

## 2018-12-23 LAB — HIV ANTIBODY (ROUTINE TESTING W REFLEX): HIV Screen 4th Generation wRfx: NONREACTIVE

## 2018-12-24 LAB — URINE CULTURE

## 2018-12-25 ENCOUNTER — Other Ambulatory Visit: Payer: Self-pay | Admitting: Family Medicine

## 2018-12-25 DIAGNOSIS — E559 Vitamin D deficiency, unspecified: Secondary | ICD-10-CM

## 2018-12-25 LAB — CERVICOVAGINAL ANCILLARY ONLY
Bacterial Vaginitis (gardnerella): NEGATIVE
Candida Glabrata: NEGATIVE
Candida Vaginitis: POSITIVE — AB
Chlamydia: NEGATIVE
Comment: NEGATIVE
Comment: NEGATIVE
Comment: NEGATIVE
Comment: NEGATIVE
Comment: NEGATIVE
Comment: NORMAL
Neisseria Gonorrhea: NEGATIVE
Trichomonas: NEGATIVE

## 2018-12-25 MED ORDER — VITAMIN D (ERGOCALCIFEROL) 1.25 MG (50000 UNIT) PO CAPS: 50000.0000 [IU] | ORAL_CAPSULE | ORAL | 2 refills | Status: DC

## 2018-12-25 NOTE — Progress Notes (Signed)
Patient ID: Diane Lloyd, female   DOB: 12-11-1990, 28 y.o.   MRN: [id number]   Patient with mild vitamin D deficiency on recent blood work and patient would like to go ahead and have prescription vitamin D therapy sent to her pharmacy.

## 2018-12-26 ENCOUNTER — Other Ambulatory Visit: Payer: Self-pay

## 2018-12-26 ENCOUNTER — Ambulatory Visit: Payer: Self-pay | Attending: Family Medicine

## 2018-12-26 DIAGNOSIS — R300 Dysuria: Secondary | ICD-10-CM

## 2018-12-26 NOTE — ED Provider Notes (Signed)
[name]   [id number] 12/21/18 Arrival Time: [place]:  1. Vaginal burning     Declines STI testing or empiric treatment. Her goal was to r/u UTI this evening. See her gyn provider tomorrow.   Discharge Instructions     Keep your appointment with your gyn provider tomorrow morning.    Pending: Labs Reviewed  POCT URINALYSIS DIP (DEVICE) - Abnormal; Notable for the following components:      Result Value   Hgb urine dipstick TRACE (*)    All other components within normal limits     Reviewed expectations re: course of current medical issues. Questions answered. Outlined signs and symptoms indicating need for more acute intervention. Patient verbalized understanding. After Visit Summary given.   SUBJECTIVE:  Diane Lloyd is a 28 y.o. female who presents with complaint of vaginal "burning". Onset gradual. First noticed approx 2-3 d ago. Denies vaginal discharge or odor. Questions mild dysuria; more at end of urination. No specific aggravating or alleviating factors reported. Denies: gross hematuria. Afebrile. No abdominal or pelvic pain. Normal PO intake wihout n/v. No genital rashes or lesions. Reports that she is sexually active with single female partner. OTC treatment: none reported. History of STI: none reported.  Patient's last menstrual period was 11/22/2018.  ROS: As per HPI.  OBJECTIVE:  Vitals:   12/21/18 1903  BP: 128/65  Pulse: 64  Resp: 15  Temp: 98.5 F (36.9 C)  TempSrc: Oral  SpO2: 100%     General appearance: alert, cooperative, appears stated age and no distress Throat: lips, mucosa, and tongue normal; teeth and gums normal CV: RRR Lungs: CTAB Back: no CVA tenderness; FROM at waist Abdomen: soft, non-tender GU: deferred Skin: warm and dry Psychological: alert and cooperative; normal mood and affect.  Results for orders placed or performed during the hospital encounter of 12/21/18  POCT urinalysis dip  (device)  Result Value Ref Range   Glucose, UA NEGATIVE NEGATIVE mg/dL   Bilirubin Urine NEGATIVE NEGATIVE   Ketones, ur NEGATIVE NEGATIVE mg/dL   Specific Gravity, Urine >=1.030 1.005 - 1.030   Hgb urine dipstick TRACE (A) NEGATIVE   pH 6.0 5.0 - 8.0   Protein, ur NEGATIVE NEGATIVE mg/dL   Urobilinogen, UA 1.0 0.0 - 1.0 mg/dL   Nitrite NEGATIVE NEGATIVE   Leukocytes,Ua NEGATIVE NEGATIVE    Labs Reviewed  POCT URINALYSIS DIP (DEVICE) - Abnormal; Notable for the following components:      Result Value   Hgb urine dipstick TRACE (*)    All other components within normal limits    Allergies  Allergen Reactions  . Diflucan [Fluconazole] Itching and Rash    Past Medical History:  Diagnosis Date  . Anemia    iron def  . Anxiety   . Asthma   . Hypertension   . Obesity, unspecified   . PCOS (polycystic ovarian syndrome)   . Positive ANA (antinuclear antibody) 05/05/2017   done by [facility] in [facility]; ANA 1:160, speckled pattern   Family History  Problem Relation Age of Onset  . Cancer Father 43       esophagus  . Asthma Father   . Esophageal cancer Father   . Hypertension Mother   . Lupus Mother   . Diabetes Mother   . Cancer Maternal Uncle 40       pancreatic cancer  . Cancer Paternal Grandmother 21       esophageal  . Anxiety disorder Paternal Aunt   .  Depression Paternal Aunt   . Anxiety disorder Cousin   . Colon cancer Neg Hx   . Ulcerative colitis Neg Hx    Social History   Socioeconomic History  . Marital status: Single    Spouse name: Not on file  . Number of children: 0  . Years of education: Not on file  . Highest education level: Some college, no degree  Occupational History  . Occupation: CSR    Employer: [employer]  Tobacco Use  . Smoking status: Never Smoker  . Smokeless tobacco: Never Used  Substance and Sexual Activity  . Alcohol use: Yes    Comment: occasional  . Drug use: Not Currently  . Sexual activity: Yes    Birth  control/protection: None  Other Topics Concern  . Not on file  Social History Narrative   Patient is right-handed. She lives with her mother and uncle in a one level home. She occasionally drinks caffeine. She does not exercise.   Social Determinants of Health   Financial Resource Strain:   . Difficulty of Paying Living Expenses: Not on file  Food Insecurity:   . Worried About Programme researcher, broadcasting/film/video in the Last Year: Not on file  . Ran Out of Food in the Last Year: Not on file  Transportation Needs:   . Lack of Transportation (Medical): Not on file  . Lack of Transportation (Non-Medical): Not on file  Physical Activity:   . Days of Exercise per Week: Not on file  . Minutes of Exercise per Session: Not on file  Stress:   . Feeling of Stress : Not on file  Social Connections:   . Frequency of Communication with Friends and Family: Not on file  . Frequency of Social Gatherings with Friends and Family: Not on file  . Attends Religious Services: Not on file  . Active Member of Clubs or Organizations: Not on file  . Attends Banker Meetings: Not on file  . Marital Status: Not on file  Intimate Partner Violence:   . Fear of Current or Ex-Partner: Not on file  . Emotionally Abused: Not on file  . Physically Abused: Not on file  . Sexually Abused: Not on file          Diane Layman, MD 12/26/18 [phone]

## 2018-12-27 ENCOUNTER — Encounter (HOSPITAL_COMMUNITY): Payer: Self-pay

## 2018-12-28 ENCOUNTER — Ambulatory Visit (HOSPITAL_COMMUNITY)
Admission: RE | Admit: 2018-12-28 | Discharge: 2018-12-28 | Disposition: A | Discharge status: Home / Self Care | Payer: Self-pay | Source: Ambulatory Visit | Attending: Cardiology | Admitting: Cardiology

## 2018-12-28 ENCOUNTER — Ambulatory Visit (HOSPITAL_COMMUNITY): Admission: RE | Admit: 2018-12-28 | Payer: Self-pay | Source: Ambulatory Visit

## 2018-12-28 ENCOUNTER — Other Ambulatory Visit: Payer: Self-pay

## 2018-12-28 DIAGNOSIS — R079 Chest pain, unspecified: Secondary | ICD-10-CM | POA: Insufficient documentation

## 2018-12-28 LAB — URINE CULTURE

## 2018-12-28 MED ORDER — GADOBUTROL 1 MMOL/ML IV SOLN
10.0000 mL | Freq: Once | INTRAVENOUS | Status: AC | PRN
  Administered 2018-12-28: 10 mL via INTRAVENOUS

## 2018-12-29 ENCOUNTER — Other Ambulatory Visit: Payer: Self-pay

## 2018-12-29 ENCOUNTER — Telehealth: Payer: Self-pay | Admitting: Cardiology

## 2018-12-29 DIAGNOSIS — Z20822 Contact with and (suspected) exposure to covid-19: Secondary | ICD-10-CM

## 2018-12-29 NOTE — Telephone Encounter (Signed)
New Message  Patient is calling in to speak with Dr. Radford Pax about her MRI results. Please give patient a call to discuss.

## 2018-12-29 NOTE — Telephone Encounter (Signed)
LMTCB

## 2018-12-30 LAB — NOVEL CORONAVIRUS, NAA: SARS-CoV-2, NAA: NOT DETECTED

## 2019-01-01 ENCOUNTER — Telehealth: Payer: Self-pay

## 2019-01-01 ENCOUNTER — Other Ambulatory Visit: Payer: Self-pay

## 2019-01-01 DIAGNOSIS — Z01818 Encounter for other preprocedural examination: Secondary | ICD-10-CM

## 2019-01-01 DIAGNOSIS — R079 Chest pain, unspecified: Secondary | ICD-10-CM

## 2019-01-01 DIAGNOSIS — R072 Precordial pain: Secondary | ICD-10-CM

## 2019-01-01 MED ORDER — METOPROLOL TARTRATE 25 MG PO TABS: 25.0000 mg | ORAL_TABLET | Freq: Once | ORAL | 0 refills | Status: DC

## 2019-01-01 NOTE — Telephone Encounter (Signed)
Spoke with patient regarding Dr. Theodosia Blender recommendation for a Cardiac CTA and went over instructions with her. Patient verbalized understanding.

## 2019-01-01 NOTE — Telephone Encounter (Signed)
LMTCB in regards to Dr. Theodosia Blender recommendation for patient to have a cardiac CT (see MyChart message).

## 2019-01-02 ENCOUNTER — Emergency Department (HOSPITAL_BASED_OUTPATIENT_CLINIC_OR_DEPARTMENT_OTHER)
Admission: EM | Admit: 2019-01-02 | Discharge: 2019-01-02 | Disposition: A | Discharge status: Home / Self Care | Payer: Self-pay | Attending: Emergency Medicine | Admitting: Emergency Medicine

## 2019-01-02 ENCOUNTER — Emergency Department (HOSPITAL_BASED_OUTPATIENT_CLINIC_OR_DEPARTMENT_OTHER)
Admission: EM | Admit: 2019-01-02 | Discharge: 2019-01-03 | Disposition: A | Discharge status: Home / Self Care | Payer: Medicaid Other | Attending: Emergency Medicine | Admitting: Emergency Medicine

## 2019-01-02 ENCOUNTER — Other Ambulatory Visit: Payer: Self-pay

## 2019-01-02 ENCOUNTER — Encounter (HOSPITAL_BASED_OUTPATIENT_CLINIC_OR_DEPARTMENT_OTHER): Payer: Self-pay | Admitting: *Deleted

## 2019-01-02 ENCOUNTER — Encounter (HOSPITAL_BASED_OUTPATIENT_CLINIC_OR_DEPARTMENT_OTHER): Payer: Self-pay

## 2019-01-02 ENCOUNTER — Emergency Department (HOSPITAL_BASED_OUTPATIENT_CLINIC_OR_DEPARTMENT_OTHER): Payer: Self-pay

## 2019-01-02 DIAGNOSIS — J45909 Unspecified asthma, uncomplicated: Secondary | ICD-10-CM | POA: Insufficient documentation

## 2019-01-02 DIAGNOSIS — R0789 Other chest pain: Secondary | ICD-10-CM

## 2019-01-02 DIAGNOSIS — I10 Essential (primary) hypertension: Secondary | ICD-10-CM | POA: Insufficient documentation

## 2019-01-02 DIAGNOSIS — Z79899 Other long term (current) drug therapy: Secondary | ICD-10-CM | POA: Insufficient documentation

## 2019-01-02 DIAGNOSIS — R110 Nausea: Secondary | ICD-10-CM | POA: Insufficient documentation

## 2019-01-02 DIAGNOSIS — B349 Viral infection, unspecified: Secondary | ICD-10-CM

## 2019-01-02 DIAGNOSIS — B373 Candidiasis of vulva and vagina: Secondary | ICD-10-CM | POA: Insufficient documentation

## 2019-01-02 DIAGNOSIS — R6883 Chills (without fever): Secondary | ICD-10-CM | POA: Insufficient documentation

## 2019-01-02 DIAGNOSIS — B3731 Acute candidiasis of vulva and vagina: Secondary | ICD-10-CM

## 2019-01-02 DIAGNOSIS — R519 Headache, unspecified: Secondary | ICD-10-CM

## 2019-01-02 DIAGNOSIS — R202 Paresthesia of skin: Secondary | ICD-10-CM | POA: Insufficient documentation

## 2019-01-02 DIAGNOSIS — M791 Myalgia, unspecified site: Secondary | ICD-10-CM | POA: Insufficient documentation

## 2019-01-02 LAB — CBC WITH DIFFERENTIAL/PLATELET
Abs Immature Granulocytes: 0.02 10*3/uL (ref 0.00–0.07)
Basophils Absolute: 0 10*3/uL (ref 0.0–0.1)
Basophils Relative: 0 %
Eosinophils Absolute: 0 10*3/uL (ref 0.0–0.5)
Eosinophils Relative: 1 %
HCT: 35.7 % — ABNORMAL LOW (ref 36.0–46.0)
Hemoglobin: 10.9 g/dL — ABNORMAL LOW (ref 12.0–15.0)
Immature Granulocytes: 0 %
Lymphocytes Relative: 36 %
Lymphs Abs: 2.2 10*3/uL (ref 0.7–4.0)
MCH: 24 pg — ABNORMAL LOW (ref 26.0–34.0)
MCHC: 30.5 g/dL (ref 30.0–36.0)
MCV: 78.5 fL — ABNORMAL LOW (ref 80.0–100.0)
Monocytes Absolute: 0.5 10*3/uL (ref 0.1–1.0)
Monocytes Relative: 8 %
Neutro Abs: 3.5 10*3/uL (ref 1.7–7.7)
Neutrophils Relative %: 55 %
Platelets: 335 10*3/uL (ref 150–400)
RBC: 4.55 MIL/uL (ref 3.87–5.11)
RDW: 16.5 % — ABNORMAL HIGH (ref 11.5–15.5)
WBC: 6.3 10*3/uL (ref 4.0–10.5)
nRBC: 0 % (ref 0.0–0.2)

## 2019-01-02 LAB — URINALYSIS, ROUTINE W REFLEX MICROSCOPIC
Glucose, UA: NEGATIVE mg/dL
Ketones, ur: 15 mg/dL — AB
Leukocytes,Ua: NEGATIVE
Nitrite: NEGATIVE
Protein, ur: NEGATIVE mg/dL
Specific Gravity, Urine: >1.03 — ABNORMAL HIGH (ref 1.005–1.030)
pH: 5.5 (ref 5.0–8.0)

## 2019-01-02 LAB — BASIC METABOLIC PANEL
Anion gap: 9 (ref 5–15)
BUN: 10 mg/dL (ref 6–20)
CO2: 25 mmol/L (ref 22–32)
Calcium: 9.5 mg/dL (ref 8.9–10.3)
Chloride: 100 mmol/L (ref 98–111)
Creatinine, Ser: 0.91 mg/dL (ref 0.44–1.00)
GFR calc Af Amer: >60 mL/min (ref >60)
GFR calc non Af Amer: >60 mL/min (ref >60)
Glucose, Bld: 102 mg/dL — ABNORMAL HIGH (ref 70–99)
Potassium: 3.7 mmol/L (ref 3.5–5.1)
Sodium: 134 mmol/L — ABNORMAL LOW (ref 135–145)

## 2019-01-02 LAB — URINALYSIS, MICROSCOPIC (REFLEX)

## 2019-01-02 LAB — PREGNANCY, URINE: Preg Test, Ur: NEGATIVE

## 2019-01-02 MED ORDER — KETOROLAC TROMETHAMINE 15 MG/ML IJ SOLN
15.0000 mg | Freq: Once | INTRAMUSCULAR | Status: AC
  Administered 2019-01-02: 15 mg via INTRAVENOUS
  Filled 2019-01-02: qty 1

## 2019-01-02 MED ORDER — DIPHENHYDRAMINE HCL 50 MG/ML IJ SOLN
25.0000 mg | Freq: Once | INTRAMUSCULAR | Status: DC
  Filled 2019-01-02: qty 1

## 2019-01-02 MED ORDER — METOCLOPRAMIDE HCL 5 MG/ML IJ SOLN
10.0000 mg | Freq: Once | INTRAMUSCULAR | Status: AC
  Administered 2019-01-02: 10 mg via INTRAVENOUS
  Filled 2019-01-02: qty 2

## 2019-01-02 MED ORDER — FLUCONAZOLE 150 MG PO TABS
150.0000 mg | ORAL_TABLET | Freq: Once | ORAL | Status: AC
  Administered 2019-01-02: 150 mg via ORAL
  Filled 2019-01-02: qty 1

## 2019-01-02 MED ORDER — SODIUM CHLORIDE 0.9 % IV BOLUS
1000.0000 mL | Freq: Once | INTRAVENOUS | Status: AC
  Administered 2019-01-02: 1000 mL via INTRAVENOUS

## 2019-01-02 NOTE — ED Provider Notes (Signed)
[facility] Provider Note: [name], MD, FACEP  CSN: [id number] MRN: [id number] ARRIVAL: 01/02/19 at 2231 ROOM: [place]  Headache   HISTORY OF PRESENT ILLNESS  01/02/19 10:50 PM Diane Lloyd is a 28 y.o. female with about a 4-day history of body aches, chills and chest pain.  She does also had some nausea but no vomiting or diarrhea.  She has not been coughing or short of breath.  She has had 2 - Covid test in the past 5 days.  She has had no documented fever.  This is her third visit to the ED since December 29, 2018.  She is here now with a generalized headache that began on the top of her head and is radiating down the sides of her head to the neck.  She rates her pain as a 7 out of 10 and describes it as burning.  She is also having some paresthesias in her fingers but denies hyperventilation or shortness of breath today.  She has taken Tylenol without relief.  She also believes her palms look pale but denies any recent vaginal bleeding.  She is still having some left lower chest pain, worse with breathing but her chest x-ray earlier today was unremarkable.   Past Medical History:  Diagnosis Date  . Anemia    iron def  . Anxiety   . Asthma   . Hypertension   . Obesity, unspecified   . PCOS (polycystic ovarian syndrome)   . Positive ANA (antinuclear antibody) 05/05/2017   done by [facility] in [facility]; ANA 1:160, speckled pattern    Past Surgical History:  Procedure Laterality Date  . TONSILLECTOMY      Family History  Problem Relation Age of Onset  . Cancer Father 66       esophagus  . Asthma Father   . Esophageal cancer Father   . Hypertension Mother   . Lupus Mother   . Diabetes Mother   . Cancer Maternal Uncle 40       pancreatic cancer  . Cancer Paternal Grandmother 22       esophageal  . Anxiety disorder Paternal Aunt   . Depression Paternal Aunt   . Anxiety disorder Cousin   . Colon cancer Neg Hx   .  Ulcerative colitis Neg Hx     Social History   Tobacco Use  . Smoking status: Never Smoker  . Smokeless tobacco: Never Used  Substance Use Topics  . Alcohol use: Yes    Comment: occasional  . Drug use: Not Currently    Prior to Admission medications   Medication Sig Start Date End Date Taking? Authorizing Provider  atenolol (TENORMIN) 25 MG tablet Take 1 tablet (25 mg total) by mouth daily. You may take an extra tablet as needed for palpitations 08/04/18   Sueanne Margarita, MD  lisinopril-hydrochlorothiazide (ZESTORETIC) 20-25 MG tablet Take 1 tablet by mouth daily. 08/10/18   Sueanne Margarita, MD  metoprolol tartrate (LOPRESSOR) 25 MG tablet Take 1 tablet (25 mg total) by mouth once for 1 dose. Take one tablet (25 mg) 2 hours prior to your CT scan. 01/01/19 01/01/19  Sueanne Margarita, MD  omeprazole (PRILOSEC) 40 MG capsule Take 1 capsule (40 mg total) by mouth daily. 12/11/18   Fulp, Cammie, MD  Vitamin D, Ergocalciferol, (DRISDOL) 1.25 MG (50000 UT) CAPS capsule Take 1 capsule (50,000 Units total) by mouth every 7 (seven) days. 12/25/18   Fulp, Cammie,  MD  cetirizine (ZYRTEC) 10 MG tablet Take 1 tablet (10 mg total) by mouth at bedtime. To help with head pressure/congestion 06/23/18 07/16/18  Fulp, Hewitt Shortsammie, MD  ferrous sulfate 325 (65 FE) MG tablet Take 1 tablet (325 mg total) by mouth daily with breakfast. TAKE WITH ORANGE JUICE Patient not taking: Reported on 06/15/2018 04/28/18 07/16/18  Street, MarionMercedes, PA-C  PARoxetine (PAXIL) 20 MG tablet Take 1 tablet (20 mg total) by mouth daily. Patient not taking: Reported on 12/04/2018 07/27/18 01/03/19  Pucilowski, Roosvelt Maserlgierd A, MD  traZODone (DESYREL) 100 MG tablet Take 1 tablet (100 mg total) by mouth at bedtime as needed for sleep. Take 1/2 or 1 tablet at bedtime as needed for sleep. Patient not taking: Reported on 12/04/2018 11/06/18 01/03/19  Pucilowski, Roosvelt Maserlgierd A, MD    Allergies Diflucan [fluconazole]   REVIEW OF SYSTEMS  Negative except as  noted here or in the History of Present Illness.   PHYSICAL EXAMINATION  Initial Vital Signs Blood pressure 131/79, pulse 76, temperature 98.8 F (37.1 C), temperature source Oral, resp. rate 18, height 5\' 11"  (1.803 m), weight 104.3 kg, last menstrual period 11/26/2018, SpO2 100 %.  Examination General: Well-developed, well-nourished female in no acute distress; appearance consistent with age of record HENT: normocephalic; atraumatic Eyes: pupils equal, round and reactive to light; extraocular muscles intact Neck: supple; no meningeal signs Heart: regular rate and rhyth Lungs: clear to auscultation bilaterally Abdomen: soft; nondistended; nontender; bowel sounds present Extremities: No deformity; full range of motion; pulses normal Neurologic: Awake, alert and oriented; motor function intact in all extremities and symmetric; no facial droop Skin: Warm and dry Psychiatric: Normal mood and affect   RESULTS  Summary of this visit's results, reviewed and interpreted by myself:   EKG Interpretation  Date/Time:    Ventricular Rate:    PR Interval:    QRS Duration:   QT Interval:    QTC Calculation:   R Axis:     Text Interpretation:        Laboratory Studies: Results for orders placed or performed during the hospital encounter of 01/02/19 (from the past 24 hour(s))  Urinalysis, Routine w reflex microscopic     Status: Abnormal   Collection Time: 01/02/19 11:21 PM  Result Value Ref Range   Color, Urine YELLOW YELLOW   APPearance CLOUDY (A) CLEAR   Specific Gravity, Urine >1.030 (H) 1.005 - 1.030   pH 5.5 5.0 - 8.0   Glucose, UA NEGATIVE NEGATIVE mg/dL   Hgb urine dipstick TRACE (A) NEGATIVE   Bilirubin Urine SMALL (A) NEGATIVE   Ketones, ur 15 (A) NEGATIVE mg/dL   Protein, ur NEGATIVE NEGATIVE mg/dL   Nitrite NEGATIVE NEGATIVE   Leukocytes,Ua NEGATIVE NEGATIVE  Pregnancy, urine     Status: None   Collection Time: 01/02/19 11:21 PM  Result Value Ref Range   Preg  Test, Ur NEGATIVE NEGATIVE  CBC with Differential     Status: Abnormal   Collection Time: 01/02/19 11:21 PM  Result Value Ref Range   WBC 6.3 4.0 - 10.5 K/uL   RBC 4.55 3.87 - 5.11 MIL/uL   Hemoglobin 10.9 (L) 12.0 - 15.0 g/dL   HCT 16.135.7 (L) 09.636.0 - 04.546.0 %   MCV 78.5 (L) 80.0 - 100.0 fL   MCH 24.0 (L) 26.0 - 34.0 pg   MCHC 30.5 30.0 - 36.0 g/dL   RDW 40.916.5 (H) 81.111.5 - 91.415.5 %   Platelets 335 150 - 400 K/uL   nRBC 0.0 0.0 - 0.2 %  Neutrophils Relative % 55 %   Neutro Abs 3.5 1.7 - 7.7 K/uL   Lymphocytes Relative 36 %   Lymphs Abs 2.2 0.7 - 4.0 K/uL   Monocytes Relative 8 %   Monocytes Absolute 0.5 0.1 - 1.0 K/uL   Eosinophils Relative 1 %   Eosinophils Absolute 0.0 0.0 - 0.5 K/uL   Basophils Relative 0 %   Basophils Absolute 0.0 0.0 - 0.1 K/uL   Immature Granulocytes 0 %   Abs Immature Granulocytes 0.02 0.00 - 0.07 K/uL  Basic metabolic panel     Status: Abnormal   Collection Time: 01/02/19 11:21 PM  Result Value Ref Range   Sodium 134 (L) 135 - 145 mmol/L   Potassium 3.7 3.5 - 5.1 mmol/L   Chloride 100 98 - 111 mmol/L   CO2 25 22 - 32 mmol/L   Glucose, Bld 102 (H) 70 - 99 mg/dL   BUN 10 6 - 20 mg/dL   Creatinine, Ser 4.40 0.44 - 1.00 mg/dL   Calcium 9.5 8.9 - 34.7 mg/dL   GFR calc non Af Amer >60 >60 mL/min   GFR calc Af Amer >60 >60 mL/min   Anion gap 9 5 - 15  Urinalysis, Microscopic (reflex)     Status: Abnormal   Collection Time: 01/02/19 11:21 PM  Result Value Ref Range   RBC / HPF 0-5 0 - 5 RBC/hpf   WBC, UA 6-10 0 - 5 WBC/hpf   Bacteria, UA MANY (A) NONE SEEN   Squamous Epithelial / LPF 11-20 0 - 5   Budding Yeast PRESENT    Imaging Studies: DG Chest Portable 1 View  Result Date: 01/02/2019 CLINICAL DATA:  Chest pain EXAM: PORTABLE CHEST 1 VIEW COMPARISON:  August 10, 2018 FINDINGS: The heart size and mediastinal contours are within normal limits. Both lungs are clear. The visualized skeletal structures are unremarkable. IMPRESSION: No active disease.  Electronically Signed   By: Jonna Clark M.D.   On: 01/02/2019 01:52    ED COURSE and MDM  Nursing notes, initial and subsequent vitals signs, including pulse oximetry, reviewed and interpreted by myself.  Vitals:   01/02/19 2249  BP: 131/79  Pulse: 76  Resp: 18  Temp: 98.8 F (37.1 C)  TempSrc: Oral  SpO2: 100%  Weight: 104.3 kg  Height: 5\' 11"  (1.803 m)   Medications  diphenhydrAMINE (BENADRYL) injection 25 mg (25 mg Intravenous Refused 01/02/19 2320)  sodium chloride 0.9 % bolus 1,000 mL (1,000 mLs Intravenous New Bag/Given 01/02/19 2317)  metoCLOPramide (REGLAN) injection 10 mg (10 mg Intravenous Given 01/02/19 2317)  ketorolac (TORADOL) 15 MG/ML injection 15 mg (15 mg Intravenous Given 01/02/19 2319)  fluconazole (DIFLUCAN) tablet 150 mg (150 mg Oral Given 01/02/19 2355)   12:25 AM Headache resolved after IV medications.  Urinalysis suggested she was dehydrated which can contribute to headache.  She was also treated for a yeast infection.  PROCEDURES  Procedures   ED DIAGNOSES     ICD-10-CM   1. Bad headache  R51.9   2. Candida vaginitis  B37.3        Niquan Charnley, MD 01/03/19 [phone]

## 2019-01-02 NOTE — ED Triage Notes (Signed)
Multiple complaints of generalized body aches x 3 days. No relief with tylenol or aleve. Pt reports being tested x 2 for COVID with negative results each time in the last 5 days. Denies fever.

## 2019-01-02 NOTE — ED Triage Notes (Addendum)
Pt c/o HA x today-"feels like my head is burning"-points to top of head for pain site-denies head injury-when questioned about neg covid test on 12/18 pt states she had body aches and fatigue last week-NAD-steady gait

## 2019-01-02 NOTE — ED Provider Notes (Signed)
[facility] EMERGENCY DEPARTMENT Provider Note   CSN: [id number] Arrival date & time: 01/02/19  0012     History Chief Complaint  Patient presents with   Generalized Body Aches    Diane Lloyd is a 28 y.o. female.  HPI     This is a 28 year old female with a history of hypertension, PCOS, asthma who presents with generalized body aches , Chills, chest pain.  Patient reports over the last 3 or 4 days, she has developed generalized body aches and recurrent chills.  At times she reports chest pain that she describes as burning in the middle of the chest that radiates to the back.  Denies vomiting.  Does report some nausea.  Reports normal bowel movements.  Denies any cough or shortness of breath.  She reports having 2 neg Covid tests in the last 5 days.  No documented fevers.  No known sick contacts.  Reports taking Tylenol with minimal relief.  Past Medical History:  Diagnosis Date   Anemia    iron def   Anxiety    Asthma    Hypertension    Obesity, unspecified    PCOS (polycystic ovarian syndrome)    Positive ANA (antinuclear antibody) 05/05/2017   done by [facility] in [facility]; ANA 1:160, speckled pattern    Patient Active Problem List   Diagnosis Date Noted   Generalized anxiety disorder with panic attacks 06/20/2018   Vaginal candidiasis 12/18/2015   Diarrhea 11/10/2015   Gastroenteritis 09/24/2015   Screen for STD (sexually transmitted disease) 09/21/2015   Rash and nonspecific skin eruption 08/31/2015   Dysmenorrhea 08/31/2015   Allergic rhinitis 05/17/2013   GERD (gastroesophageal reflux disease) 03/20/2012   Polycystic ovarian syndrome 12/26/2011   Abdominal pain 12/26/2011   Chest pain 11/01/2011   Family history of cardiac disorder 02/17/2011   Contraception management 12/30/2010   Depression, major, single episode, mild ([place]) 09/23/2010   Essential hypertension 01/27/2010   ANEMIA, IRON DEFICIENCY  06/27/2009   Obesity 03/10/2006   ASTHMA, INTERMITTENT 03/10/2006   ECZEMA, ATOPIC DERMATITIS 03/10/2006    Past Surgical History:  Procedure Laterality Date   TONSILLECTOMY       OB History    Gravida  0   Para      Term      Preterm      AB      Living        SAB      TAB      Ectopic      Multiple      Live Births              Family History  Problem Relation Age of Onset   Cancer Father 75       esophagus   Asthma Father    Esophageal cancer Father    Hypertension Mother    Lupus Mother    Diabetes Mother    Cancer Maternal Uncle 60       pancreatic cancer   Cancer Paternal Grandmother 3       esophageal   Anxiety disorder Paternal Aunt    Depression Paternal Aunt    Anxiety disorder Cousin    Colon cancer Neg Hx    Ulcerative colitis Neg Hx     Social History   Tobacco Use   Smoking status: Never Smoker   Smokeless tobacco: Never Used  Substance Use Topics   Alcohol use: Yes    Comment: occasional   Drug  use: Not Currently    Home Medications Prior to Admission medications   Medication Sig Start Date End Date Taking? Authorizing Provider  atenolol (TENORMIN) 25 MG tablet Take 1 tablet (25 mg total) by mouth daily. You may take an extra tablet as needed for palpitations 08/04/18   Quintella Reichert, MD  cephALEXin (KEFLEX) 500 MG capsule Take 1 capsule (500 mg total) by mouth 2 (two) times daily. Patient not taking: Reported on 12/22/2018 12/04/18   Hoy Register, MD  cyclobenzaprine (FLEXERIL) 10 MG tablet Take 1 tablet (10 mg total) by mouth 2 (two) times daily as needed for muscle spasms. Patient not taking: Reported on 12/04/2018 11/07/18   Mesner, Barbara Cower, MD  lisinopril-hydrochlorothiazide (ZESTORETIC) 20-25 MG tablet Take 1 tablet by mouth daily. 08/10/18   Quintella Reichert, MD  methylPREDNISolone (MEDROL DOSEPAK) 4 MG TBPK tablet Take per dose pack instruction Patient not taking: Reported on 12/04/2018  11/05/18   Arby Barrette, MD  metoprolol tartrate (LOPRESSOR) 25 MG tablet Take 1 tablet (25 mg total) by mouth once for 1 dose. Take one tablet (25 mg) 2 hours prior to your CT scan. 01/01/19 01/01/19  Quintella Reichert, MD  naproxen (NAPROSYN) 375 MG tablet Take 1 tablet twice daily as needed for chest wall pain. Patient not taking: Reported on 12/22/2018 08/10/18   Molpus, Jonny Ruiz, MD  omeprazole (PRILOSEC) 40 MG capsule Take 1 capsule (40 mg total) by mouth daily. 12/11/18   Fulp, Cammie, MD  ondansetron (ZOFRAN) 8 MG tablet Take 1 tablet (8 mg total) by mouth every 8 (eight) hours as needed for nausea or vomiting. Patient not taking: Reported on 12/22/2018 11/30/18   Anders Simmonds, PA-C  PARoxetine (PAXIL) 20 MG tablet Take 1 tablet (20 mg total) by mouth daily. Patient not taking: Reported on 12/04/2018 07/27/18 01/23/19  Pucilowski, Roosvelt Maser, MD  traZODone (DESYREL) 100 MG tablet Take 1 tablet (100 mg total) by mouth at bedtime as needed for sleep. Take 1/2 or 1 tablet at bedtime as needed for sleep. Patient not taking: Reported on 12/04/2018 11/06/18 02/04/19  Pucilowski, Roosvelt Maser, MD  Vitamin D, Ergocalciferol, (DRISDOL) 1.25 MG (50000 UT) CAPS capsule Take 1 capsule (50,000 Units total) by mouth every 7 (seven) days. 12/25/18   Fulp, Cammie, MD  cetirizine (ZYRTEC) 10 MG tablet Take 1 tablet (10 mg total) by mouth at bedtime. To help with head pressure/congestion 06/23/18 07/16/18  Fulp, Hewitt Shorts, MD  ferrous sulfate 325 (65 FE) MG tablet Take 1 tablet (325 mg total) by mouth daily with breakfast. TAKE WITH ORANGE JUICE Patient not taking: Reported on 06/15/2018 04/28/18 07/16/18  Street, Naplate, PA-C    Allergies    Diflucan [fluconazole]  Review of Systems   Review of Systems  Constitutional: Positive for chills. Negative for fever.  Respiratory: Negative for shortness of breath.   Cardiovascular: Positive for chest pain.  Gastrointestinal: Negative for abdominal pain, nausea and  vomiting.  Musculoskeletal: Positive for myalgias.  Neurological: Negative for headaches.  All other systems reviewed and are negative.   Physical Exam Updated Vital Signs BP 122/73 (BP Location: Right Arm)    Pulse 63    Temp 98.3 F (36.8 C) (Oral)    Resp 15    Ht 1.803 m (5\' 11" )    Wt 104.3 kg    LMP 11/29/2018    SpO2 100%    BMI 32.08 kg/m   Physical Exam Vitals and nursing note reviewed.  Constitutional:      Appearance:  She is well-developed. She is obese.  HENT:     Head: Normocephalic and atraumatic.     Mouth/Throat:     Mouth: Mucous membranes are moist.  Eyes:     Pupils: Pupils are equal, round, and reactive to light.  Cardiovascular:     Rate and Rhythm: Normal rate and regular rhythm.     Heart sounds: Normal heart sounds.  Pulmonary:     Effort: Pulmonary effort is normal. No respiratory distress.     Breath sounds: No wheezing.  Abdominal:     General: Bowel sounds are normal.     Palpations: Abdomen is soft.  Musculoskeletal:     Cervical back: Neck supple.     Right lower leg: No edema.     Left lower leg: No edema.  Skin:    General: Skin is warm and dry.  Neurological:     Mental Status: She is alert and oriented to person, place, and time.  Psychiatric:        Mood and Affect: Mood normal.     ED Results / Procedures / Treatments   Labs (all labs ordered are listed, but only abnormal results are displayed) Labs Reviewed - No data to display  EKG EKG Interpretation  Date/Time:  Tuesday January 02 2019 01:44:31 EST Ventricular Rate:  58 PR Interval:    QRS Duration: 107 QT Interval:  397 QTC Calculation: 390 R Axis:   49 Text Interpretation: Sinus rhythm Confirmed by Ross Marcus ([id number]) on 01/02/2019 1:57:39 AM   Radiology DG Chest Portable 1 View  Result Date: 01/02/2019 CLINICAL DATA:  Chest pain EXAM: PORTABLE CHEST 1 VIEW COMPARISON:  August 10, 2018 FINDINGS: The heart size and mediastinal contours are within normal  limits. Both lungs are clear. The visualized skeletal structures are unremarkable. IMPRESSION: No active disease. Electronically Signed   By: Jonna Clark M.D.   On: 01/02/2019 01:52    Procedures Procedures (including critical care time)  Medications Ordered in ED Medications - No data to display  ED Course  I have reviewed the triage vital signs and the nursing notes.  Pertinent labs & imaging results that were available during my care of the patient were reviewed by me and considered in my medical decision making (see chart for details).    MDM Rules/Calculators/A&P                      This is a 28 year old female who presents with myalgias, chills, and intermittent chest pain.  Negative Covid testing in the last 5 days.  She is overall nontoxic-appearing and vital signs are reassuring.  She is afebrile.  She is not tachycardic.  EKG without arrhythmia or ischemia.  Doubt PE.  Additionally, chest x-ray without pneumothorax or pneumonia.  Given description of chest discomfort, highly suspicious for reflux.  Patient could also have a viral syndrome.  Given current pandemic, she could have early Covid that has not seroconverted yet.  I discussed this with the patient.  Recommend continued supportive measures at home.  Recommend quarantine.  Take GERD medications as prescribed.  Tylenol or Motrin for body aches and pains.  Make sure to stay hydrated.  After history, exam, and medical workup I feel the patient has been appropriately medically screened and is safe for discharge home. Pertinent diagnoses were discussed with the patient. Patient was given return precautions.   Final Clinical Impression(s) / ED Diagnoses Final diagnoses:  Viral syndrome  Atypical chest pain  Rx / DC Orders ED Discharge Orders    None       Astella Desir F, Mayer MaskerMD 01/02/19 [phone]

## 2019-01-02 NOTE — Discharge Instructions (Addendum)
You were seen today for generalized body aches and chest pain.  Your x-ray and EKG are reassuring.  Your description of your chest pain is highly suspicious for reflux.  Make sure to take your reflux medications as prescribed.  Given your other symptoms, this could be a viral illness.  Given recent negative Covid testing, this is less likely although if you continue to have symptoms, you may need to be retested.  Make sure that you are staying well-hydrated.  Take Tylenol or ibuprofen for any body aches or pains.

## 2019-01-03 ENCOUNTER — Telehealth (HOSPITAL_COMMUNITY): Payer: Self-pay | Admitting: *Deleted

## 2019-01-03 NOTE — Telephone Encounter (Signed)
Patient called stating she is wanting to restart her Paxil due to having a lot of anxiety. She knows about her appointment in January but does not feel like she can wait until then to restart her medication.

## 2019-01-04 ENCOUNTER — Other Ambulatory Visit: Payer: Self-pay

## 2019-01-04 ENCOUNTER — Encounter (HOSPITAL_COMMUNITY): Payer: Self-pay

## 2019-01-04 ENCOUNTER — Other Ambulatory Visit (HOSPITAL_COMMUNITY): Payer: Self-pay | Admitting: Psychiatry

## 2019-01-04 ENCOUNTER — Ambulatory Visit (HOSPITAL_COMMUNITY)
Admission: EM | Admit: 2019-01-04 | Discharge: 2019-01-04 | Disposition: A | Discharge status: Home / Self Care | Payer: Medicaid Other | Attending: Family Medicine | Admitting: Family Medicine

## 2019-01-04 DIAGNOSIS — D509 Iron deficiency anemia, unspecified: Secondary | ICD-10-CM

## 2019-01-04 DIAGNOSIS — R590 Localized enlarged lymph nodes: Secondary | ICD-10-CM

## 2019-01-04 MED ORDER — PAROXETINE HCL 20 MG PO TABS: 20.0000 mg | ORAL_TABLET | Freq: Every day | ORAL | 0 refills | Status: DC

## 2019-01-04 MED ORDER — FERROUS SULFATE 325 (65 FE) MG PO TABS: 325.0000 mg | ORAL_TABLET | Freq: Two times a day (BID) | ORAL | 0 refills | Status: DC

## 2019-01-04 NOTE — ED Triage Notes (Signed)
Pt presents with swelling under chin not injury related with no complaints of pain X 2 days.

## 2019-01-04 NOTE — ED Provider Notes (Signed)
[place]    CSN: [id number] Arrival date & time: 01/04/19  0808      History   Chief Complaint Chief Complaint  Patient presents with  . Swelling under Chin    HPI Diane Lloyd is a 28 y.o. female.   HPI   Patient presents today with concern of 2 days of submental lymphadenopathy.  Patient has been seen at the ER within the last 2 days with complete CBC and CMP which indicated microcytic anemia. CMP otherwise unremarkable.  Patient denies any dental or oral pain.  She is also had a recent TSH level which was normal.  She is currently being followed by cardiology due to some ongoing atypical chest pain.  Patient was also concerned of Covid and was recently tested which was negative.  Patient also endorses poor water intake UA collected on 12/22 indicated dehydration.  Past Medical History:  Diagnosis Date  . Anemia    iron def  . Anxiety   . Asthma   . Hypertension   . Obesity, unspecified   . PCOS (polycystic ovarian syndrome)   . Positive ANA (antinuclear antibody) 05/05/2017   done by [facility] in [facility]; ANA 1:160, speckled pattern    Patient Active Problem List   Diagnosis Date Noted  . Generalized anxiety disorder with panic attacks 06/20/2018  . Vaginal candidiasis 12/18/2015  . Diarrhea 11/10/2015  . Gastroenteritis 09/24/2015  . Screen for STD (sexually transmitted disease) 09/21/2015  . Rash and nonspecific skin eruption 08/31/2015  . Dysmenorrhea 08/31/2015  . Allergic rhinitis 05/17/2013  . GERD (gastroesophageal reflux disease) 03/20/2012  . Polycystic ovarian syndrome 12/26/2011  . Abdominal pain 12/26/2011  . Chest pain 11/01/2011  . Family history of cardiac disorder 02/17/2011  . Contraception management 12/30/2010  . Depression, major, single episode, mild ([place]) 09/23/2010  . Essential hypertension 01/27/2010  . ANEMIA, IRON DEFICIENCY 06/27/2009  . Obesity 03/10/2006  . ASTHMA, INTERMITTENT 03/10/2006  . ECZEMA,  ATOPIC DERMATITIS 03/10/2006    Past Surgical History:  Procedure Laterality Date  . TONSILLECTOMY      OB History    Gravida  0   Para      Term      Preterm      AB      Living        SAB      TAB      Ectopic      Multiple      Live Births               Home Medications    Prior to Admission medications   Medication Sig Start Date End Date Taking? Authorizing Provider  atenolol (TENORMIN) 25 MG tablet Take 1 tablet (25 mg total) by mouth daily. You may take an extra tablet as needed for palpitations 08/04/18   Sueanne Margarita, MD  ferrous sulfate 325 (65 FE) MG tablet Take 1 tablet (325 mg total) by mouth 2 (two) times daily with a meal. 01/04/19   Scot Jun, FNP  lisinopril-hydrochlorothiazide (ZESTORETIC) 20-25 MG tablet Take 1 tablet by mouth daily. 08/10/18   Sueanne Margarita, MD  metoprolol tartrate (LOPRESSOR) 25 MG tablet Take 1 tablet (25 mg total) by mouth once for 1 dose. Take one tablet (25 mg) 2 hours prior to your CT scan. 01/01/19 01/01/19  Sueanne Margarita, MD  omeprazole (PRILOSEC) 40 MG capsule Take 1 capsule (40 mg total) by mouth daily. 12/11/18   Fulp,  Cammie, MD  Vitamin D, Ergocalciferol, (DRISDOL) 1.25 MG (50000 UT) CAPS capsule Take 1 capsule (50,000 Units total) by mouth every 7 (seven) days. 12/25/18   Fulp, Cammie, MD  cetirizine (ZYRTEC) 10 MG tablet Take 1 tablet (10 mg total) by mouth at bedtime. To help with head pressure/congestion 06/23/18 07/16/18  Fulp, Hewitt Shortsammie, MD  PARoxetine (PAXIL) 20 MG tablet Take 1 tablet (20 mg total) by mouth daily. Patient not taking: Reported on 12/04/2018 07/27/18 01/03/19  Pucilowski, Roosvelt Maserlgierd A, MD  traZODone (DESYREL) 100 MG tablet Take 1 tablet (100 mg total) by mouth at bedtime as needed for sleep. Take 1/2 or 1 tablet at bedtime as needed for sleep. Patient not taking: Reported on 12/04/2018 11/06/18 01/03/19  Pucilowski, Roosvelt Maserlgierd A, MD    Family History Family History  Problem Relation Age  of Onset  . Cancer Father 252       esophagus  . Asthma Father   . Esophageal cancer Father   . Hypertension Mother   . Lupus Mother   . Diabetes Mother   . Cancer Maternal Uncle 40       pancreatic cancer  . Cancer Paternal Grandmother 3660       esophageal  . Anxiety disorder Paternal Aunt   . Depression Paternal Aunt   . Anxiety disorder Cousin   . Colon cancer Neg Hx   . Ulcerative colitis Neg Hx     Social History Social History   Tobacco Use  . Smoking status: Never Smoker  . Smokeless tobacco: Never Used  Substance Use Topics  . Alcohol use: Yes    Comment: occasional  . Drug use: Not Currently     Allergies   Diflucan [fluconazole]   Review of Systems Review of Systems Pertinent negatives listed in HPI  Physical Exam Triage Vital Signs ED Triage Vitals  Enc Vitals Group     BP 01/04/19 0818 101/68     Pulse Rate 01/04/19 0818 67     Resp 01/04/19 0818 18     Temp 01/04/19 0818 99 F (37.2 C)     Temp Source 01/04/19 0818 Oral     SpO2 01/04/19 0818 97 %     Weight --      Height --      Head Circumference --      Peak Flow --      Pain Score 01/04/19 0819 0     Pain Loc --      Pain Edu? --      Excl. in GC? --    No data found.  Updated Vital Signs BP 101/68 (BP Location: Left Arm)   Pulse 67   Temp 99 F (37.2 C) (Oral)   Resp 18   SpO2 97%   Visual Acuity Right Eye Distance:   Left Eye Distance:   Bilateral Distance:    Right Eye Near:   Left Eye Near:    Bilateral Near:     Physical Exam Constitutional:      Appearance: She is not ill-appearing.  HENT:     Nose: Nose normal.     Mouth/Throat:     Mouth: Mucous membranes are moist.  Neck:     Comments: Submental adenopathy present. Nodes are mobile and non-tender. Cardiovascular:     Rate and Rhythm: Normal rate.  Pulmonary:     Effort: Pulmonary effort is normal.  Neurological:     Mental Status: She is alert.  Psychiatric:  Mood and Affect: Mood normal.       UC Treatments / Results  Labs (all labs ordered are listed, but only abnormal results are displayed) Labs Reviewed - No data to display  EKG   Radiology No results found.  Procedures Procedures (including critical care time)  Medications Ordered in UC Medications - No data to display  Initial Impression / Assessment and Plan / UC Course  I have reviewed the triage vital signs and the nursing notes.  Pertinent labs & imaging results that were available during my care of the patient were reviewed by me and considered in my medical decision making (see chart for details).    Submental adenopathy related to swelling of chin. Unknown etiology, likely reactive from recent viral illness. Of note, patient has a history of microcytic anemia, with most recent hemoglobin 10.9. Patient is not currently prescribed oral iron replacement. Given persistent complaint of fatigue and recurrent headaches, will start oral iron replacement. Recommended discussing with PCP checking an iron level at next follow-up 01/18/19. Recent COVID-19 testing negative. Reassurance provided. Patient verbalized understanding of plan. Final Clinical Impressions(s) / UC Diagnoses   Final diagnoses:  Submental adenopathy  Microcytic anemia     Discharge Instructions     I am prescribing iron replacement twice daily.  For iron replacement to be most effective please take iron tablet with at least 4 ounces of orange juice twice daily.  Ensure adequate water intake as iron replacement can cause constipation.  Recommended water intake is 6-8 8 ounces of water daily.  Please keep follow-up appointment with primary care provider and request a iron level and hemoglobin check at that appointment.  If the swelling of the lymph nodes under your chin persist please follow-up with your PCP in 1 week.   ED Prescriptions    Medication Sig Dispense Auth. Provider   ferrous sulfate 325 (65 FE) MG tablet Take 1 tablet (325 mg  total) by mouth 2 (two) times daily with a meal. 60 tablet Bing Neighbors, FNP     PDMP not reviewed this encounter.   Bing Neighbors, FNP 01/06/19 [phone]

## 2019-01-04 NOTE — Discharge Instructions (Addendum)
I am prescribing iron replacement twice daily.  For iron replacement to be most effective please take iron tablet with at least 4 ounces of orange juice twice daily.  Ensure adequate water intake as iron replacement can cause constipation.  Recommended water intake is 6-8 8 ounces of water daily.  Please keep follow-up appointment with primary care provider and request a iron level and hemoglobin check at that appointment.  If the swelling of the lymph nodes under your chin persist please follow-up with your PCP in 1 week.

## 2019-01-04 NOTE — Telephone Encounter (Signed)
I send a Rx for Paxil to her pharmacy.

## 2019-01-14 ENCOUNTER — Encounter (HOSPITAL_COMMUNITY): Payer: Self-pay | Admitting: Emergency Medicine

## 2019-01-14 ENCOUNTER — Emergency Department (HOSPITAL_COMMUNITY)
Admission: EM | Admit: 2019-01-14 | Discharge: 2019-01-14 | Disposition: A | Discharge status: Home / Self Care | Payer: Self-pay | Attending: Emergency Medicine | Admitting: Emergency Medicine

## 2019-01-14 ENCOUNTER — Emergency Department (HOSPITAL_COMMUNITY): Payer: Self-pay

## 2019-01-14 ENCOUNTER — Other Ambulatory Visit: Payer: Self-pay

## 2019-01-14 DIAGNOSIS — Z5321 Procedure and treatment not carried out due to patient leaving prior to being seen by health care provider: Secondary | ICD-10-CM | POA: Insufficient documentation

## 2019-01-14 DIAGNOSIS — R0789 Other chest pain: Secondary | ICD-10-CM | POA: Insufficient documentation

## 2019-01-14 NOTE — ED Triage Notes (Signed)
Per pt,, states chest pressure that started yesterday-states symptoms have eased up, not as bad today-history of anxiety

## 2019-01-16 ENCOUNTER — Other Ambulatory Visit: Payer: Self-pay

## 2019-01-16 ENCOUNTER — Ambulatory Visit (HOSPITAL_COMMUNITY)
Admission: EM | Admit: 2019-01-16 | Discharge: 2019-01-16 | Disposition: A | Discharge status: Home / Self Care | Payer: Self-pay | Attending: Internal Medicine | Admitting: Internal Medicine

## 2019-01-16 ENCOUNTER — Encounter (HOSPITAL_COMMUNITY): Payer: Self-pay | Admitting: Emergency Medicine

## 2019-01-16 DIAGNOSIS — R0789 Other chest pain: Secondary | ICD-10-CM

## 2019-01-16 MED ORDER — OMEPRAZOLE 20 MG PO CPDR: 20.0000 mg | DELAYED_RELEASE_CAPSULE | Freq: Two times a day (BID) | ORAL | 0 refills | Status: DC

## 2019-01-16 MED ORDER — HYDROXYZINE HCL 25 MG PO TABS: 25.0000 mg | ORAL_TABLET | Freq: Four times a day (QID) | ORAL | 0 refills | Status: DC | PRN

## 2019-01-16 NOTE — ED Triage Notes (Signed)
Chest pressure for 3 days.  Patient went to ED 2 days ago and had chest and ekg, but did not see a provider.  No nausea, no vomiting.  No sob

## 2019-01-16 NOTE — Discharge Instructions (Signed)
Ekg normal Please begin omeprazole 20 mg twice daily for the next 2 weeks, you may get this over-the-counter if this is cheaper Please try using hydroxyzine as needed for anxiety, try using when chest pain comes on, medicine does cause some drowsiness, do not drive or work after taking  Please continue to follow-up with cardiology and proceed with CT as planned.  If you develop chest pain that does not resolve on its own or develop worsening shortness of breath, one-sided leg pain or swelling please follow-up in emergency room

## 2019-01-17 ENCOUNTER — Other Ambulatory Visit: Payer: Self-pay

## 2019-01-17 ENCOUNTER — Encounter (HOSPITAL_BASED_OUTPATIENT_CLINIC_OR_DEPARTMENT_OTHER): Payer: Self-pay | Admitting: Emergency Medicine

## 2019-01-17 ENCOUNTER — Emergency Department (HOSPITAL_BASED_OUTPATIENT_CLINIC_OR_DEPARTMENT_OTHER)
Admission: EM | Admit: 2019-01-17 | Discharge: 2019-01-17 | Disposition: A | Discharge status: Home / Self Care | Payer: Medicaid Other | Attending: Emergency Medicine | Admitting: Emergency Medicine

## 2019-01-17 DIAGNOSIS — I10 Essential (primary) hypertension: Secondary | ICD-10-CM | POA: Insufficient documentation

## 2019-01-17 DIAGNOSIS — F418 Other specified anxiety disorders: Secondary | ICD-10-CM

## 2019-01-17 DIAGNOSIS — F419 Anxiety disorder, unspecified: Secondary | ICD-10-CM | POA: Insufficient documentation

## 2019-01-17 DIAGNOSIS — J45909 Unspecified asthma, uncomplicated: Secondary | ICD-10-CM | POA: Insufficient documentation

## 2019-01-17 DIAGNOSIS — R072 Precordial pain: Secondary | ICD-10-CM

## 2019-01-17 DIAGNOSIS — Z79899 Other long term (current) drug therapy: Secondary | ICD-10-CM | POA: Insufficient documentation

## 2019-01-17 MED ORDER — LIDOCAINE VISCOUS HCL 2 % MT SOLN
15.0000 mL | Freq: Once | OROMUCOSAL | Status: AC
  Administered 2019-01-17: 15 mL via ORAL
  Filled 2019-01-17: qty 15

## 2019-01-17 MED ORDER — ALUM & MAG HYDROXIDE-SIMETH 200-200-20 MG/5ML PO SUSP
30.0000 mL | Freq: Once | ORAL | Status: AC
  Administered 2019-01-17: 30 mL via ORAL
  Filled 2019-01-17: qty 30

## 2019-01-17 NOTE — ED Notes (Signed)
Warm blanket given

## 2019-01-17 NOTE — ED Triage Notes (Signed)
Pt c/o chest pressure that started 4 days ago and burning pain in back that started today.

## 2019-01-17 NOTE — ED Provider Notes (Signed)
[place] EMERGENCY DEPARTMENT Provider Note  CSN: [id number] Arrival date & time: 01/17/19 0126  Chief Complaint(s) Chest Pain  HPI Diane Lloyd is a 29 y.o. female with a past medical history listed below who presents to the emergency department with recurrent substernal chest pains.  Patient reports being evaluated last month for similar episodes and having a negative cardiac work-up.  Patient describes pain as a deep ache/burning in the precordial region.  She also endorses left mid back burning sensation.  Patient reports that she has not been taking her acid reflux medicine, stating that it was just called in today.  She denies any alleviating or aggravating factors.  Cough with production.  She does endorse intermittent shortness of breath at times with extremity tingling.  Currently does not have the shortness of breath or tingling.  HPI  Past Medical History Past Medical History:  Diagnosis Date  . Anemia    iron def  . Anxiety   . Asthma   . Hypertension   . Obesity, unspecified   . PCOS (polycystic ovarian syndrome)   . Positive ANA (antinuclear antibody) 05/05/2017   done by [facility] in [facility]; ANA 1:160, speckled pattern   Patient Active Problem List   Diagnosis Date Noted  . Generalized anxiety disorder with panic attacks 06/20/2018  . Vaginal candidiasis 12/18/2015  . Diarrhea 11/10/2015  . Gastroenteritis 09/24/2015  . Screen for STD (sexually transmitted disease) 09/21/2015  . Rash and nonspecific skin eruption 08/31/2015  . Dysmenorrhea 08/31/2015  . Allergic rhinitis 05/17/2013  . GERD (gastroesophageal reflux disease) 03/20/2012  . Polycystic ovarian syndrome 12/26/2011  . Abdominal pain 12/26/2011  . Chest pain 11/01/2011  . Family history of cardiac disorder 02/17/2011  . Contraception management 12/30/2010  . Depression, major, single episode, mild ([place]) 09/23/2010  . Essential hypertension 01/27/2010  . ANEMIA, IRON  DEFICIENCY 06/27/2009  . Obesity 03/10/2006  . ASTHMA, INTERMITTENT 03/10/2006  . ECZEMA, ATOPIC DERMATITIS 03/10/2006   Home Medication(s) Prior to Admission medications   Medication Sig Start Date End Date Taking? Authorizing Provider  atenolol (TENORMIN) 25 MG tablet Take 1 tablet (25 mg total) by mouth daily. You may take an extra tablet as needed for palpitations 08/04/18  Yes Turner, Eber Hong, MD  ferrous sulfate 325 (65 FE) MG tablet Take 1 tablet (325 mg total) by mouth 2 (two) times daily with a meal. 01/04/19  Yes Scot Jun, FNP  hydrOXYzine (ATARAX/VISTARIL) 25 MG tablet Take 1 tablet (25 mg total) by mouth every 6 (six) hours as needed for anxiety. 01/16/19  Yes Wieters, Hallie C, PA-C  lisinopril-hydrochlorothiazide (ZESTORETIC) 20-25 MG tablet Take 1 tablet by mouth daily. 08/10/18  Yes Turner, Eber Hong, MD  metoprolol tartrate (LOPRESSOR) 25 MG tablet Take 1 tablet (25 mg total) by mouth once for 1 dose. Take one tablet (25 mg) 2 hours prior to your CT scan. 01/01/19 01/17/19 Yes Turner, Eber Hong, MD  PARoxetine (PAXIL) 20 MG tablet Take 1 tablet (20 mg total) by mouth daily. 01/04/19 02/03/19 Yes Pucilowski, Olgierd A, MD  Vitamin D, Ergocalciferol, (DRISDOL) 1.25 MG (50000 UT) CAPS capsule Take 1 capsule (50,000 Units total) by mouth every 7 (seven) days. 12/25/18  Yes Fulp, Cammie, MD  cetirizine (ZYRTEC) 10 MG tablet Take 1 tablet (10 mg total) by mouth at bedtime. To help with head pressure/congestion 06/23/18 07/16/18  Fulp, Ander Gaster, MD  traZODone (DESYREL) 100 MG tablet Take 1 tablet (100 mg total) by mouth at bedtime as  needed for sleep. Take 1/2 or 1 tablet at bedtime as needed for sleep. Patient not taking: Reported on 12/04/2018 11/06/18 01/03/19  Pucilowski, Roosvelt Maser, MD                                                                                                                                    Past Surgical History Past Surgical History:  Procedure Laterality Date    . TONSILLECTOMY     Family History Family History  Problem Relation Age of Onset  . Cancer Father 63       esophagus  . Asthma Father   . Esophageal cancer Father   . Hypertension Mother   . Lupus Mother   . Diabetes Mother   . Cancer Maternal Uncle 40       pancreatic cancer  . Cancer Paternal Grandmother 95       esophageal  . Anxiety disorder Paternal Aunt   . Depression Paternal Aunt   . Anxiety disorder Cousin   . Colon cancer Neg Hx   . Ulcerative colitis Neg Hx     Social History Social History   Tobacco Use  . Smoking status: Never Smoker  . Smokeless tobacco: Never Used  Substance Use Topics  . Alcohol use: Yes    Comment: occasional  . Drug use: Not Currently   Allergies Diflucan [fluconazole]  Review of Systems Review of Systems All other systems are reviewed and are negative for acute change except as noted in the HPI  Physical Exam Vital Signs  I have reviewed the triage vital signs BP 111/64 (BP Location: Left Arm)   Pulse 67   Temp 98.1 F (36.7 C) (Oral)   Resp 15   Ht 5\' 11"  (1.803 m)   Wt 104.3 kg   SpO2 100%   BMI 32.08 kg/m   Physical Exam Vitals reviewed.  Constitutional:      General: She is not in acute distress.    Appearance: She is well-developed. She is not diaphoretic.  HENT:     Head: Normocephalic and atraumatic.     Nose: Nose normal.  Eyes:     General: No scleral icterus.       Right eye: No discharge.        Left eye: No discharge.     Conjunctiva/sclera: Conjunctivae normal.     Pupils: Pupils are equal, round, and reactive to light.  Cardiovascular:     Rate and Rhythm: Normal rate and regular rhythm.     Heart sounds: No murmur. No friction rub. No gallop.   Pulmonary:     Effort: Pulmonary effort is normal. No respiratory distress.     Breath sounds: Normal breath sounds. No stridor. No rales.  Chest:     Chest wall: Tenderness present.    Abdominal:     General: There is no distension.      Palpations: Abdomen is soft.  Tenderness: There is no abdominal tenderness.  Musculoskeletal:     Cervical back: Normal range of motion and neck supple.     Thoracic back: Spasms and tenderness present. No bony tenderness.       Back:  Skin:    General: Skin is warm and dry.     Findings: No erythema or rash.  Neurological:     Mental Status: She is alert and oriented to person, place, and time.     ED Results and Treatments Labs (all labs ordered are listed, but only abnormal results are displayed) Labs Reviewed - No data to display                                                                                                                       EKG  EKG Interpretation  Date/Time:    Ventricular Rate:    PR Interval:    QRS Duration:   QT Interval:    QTC Calculation:   R Axis:     Text Interpretation:        Radiology No results found.  Pertinent labs & imaging results that were available during my care of the patient were reviewed by me and considered in my medical decision making (see chart for details).  Medications Ordered in ED Medications  alum & mag hydroxide-simeth (MAALOX/MYLANTA) 200-200-20 MG/5ML suspension 30 mL (30 mLs Oral Given 01/17/19 0251)    And  lidocaine (XYLOCAINE) 2 % viscous mouth solution 15 mL (15 mLs Oral Given 01/17/19 0251)                                                                                                                                    Procedures Procedures  (including critical care time)  Medical Decision Making / ED Course I have reviewed the nursing notes for this encounter and the patient's prior records (if available in EHR or on provided paperwork).   Diane Lloyd was evaluated in Emergency Department on 01/17/2019 for the symptoms described in the history of present illness. She was evaluated in the context of the global COVID-19 pandemic, which necessitated consideration that the patient might be at risk  for infection with the SARS-CoV-2 virus that causes COVID-19. Institutional protocols and algorithms that pertain to the evaluation of patients at risk for COVID-19 are in a state of rapid change based on information released by regulatory bodies including the CDC  and federal and state organizations. These policies and algorithms were followed during the patient's care in the ED.  GERD versus MSK versus anxiety. Low suspicion for ACS.  EKG without acute ischemic changes or evidence of pericarditis. Low suspicion for pulmonary embolism. Presentation not classic for aortic dissection or esophageal perforation.  Patient provided with acid reflux resulting in significant improvement.  The patient appears reasonably screened and/or stabilized for discharge and I doubt any other medical condition or other [facility] requiring further screening, evaluation, or treatment in the ED at this time prior to discharge.  The patient is safe for discharge with strict return precautions.       Final Clinical Impression(s) / ED Diagnoses Final diagnoses:  Precordial chest pain  Anxiety about health     The patient appears reasonably screened and/or stabilized for discharge and I doubt any other medical condition or other [facility] requiring further screening, evaluation, or treatment in the ED at this time prior to discharge.  Disposition: Discharge  Condition: Good  I have discussed the results, Dx and Tx plan with the patient who expressed understanding and agree(s) with the plan. Discharge instructions discussed at great length. The patient was given strict return precautions who verbalized understanding of the instructions. No further questions at time of discharge.    ED Discharge Orders    None       Follow Up: Cain Saupe, MD [address] [place] [address] [phone]  Schedule an appointment as soon as possible for a visit  As needed     This chart was dictated using voice  recognition software.  Despite best efforts to proofread,  errors can occur which can change the documentation meaning.   Nira Conn, MD 01/17/19 [phone]

## 2019-01-17 NOTE — ED Notes (Signed)
ED Provider at bedside. 

## 2019-01-17 NOTE — ED Provider Notes (Signed)
[place]    CSN: [id number] Arrival date & time: 01/16/19  1417      History   Chief Complaint Chief Complaint  Patient presents with  . Chest Pain    HPI Diane Lloyd is a 29 y.o. female history of PCOS, hypertension, asthma, GERD, anxiety, presenting today for evaluation of chest pain.  Patient states that over the past few days she has had intermittent chest pain.  Pain is usually located bilaterally as well as underneath the left breast.  She denies any pain at this current time.  Symptoms will usually last for approximately 10 minutes.  Does have associated shortness of breath with it.  Feels when this comes on this flares of her anxiety and symptoms worsen.  She currently takes Paxil daily for anxiety.  Denies use of any as needed medicines.  Occasionally will have a burning sensation, she has not been on her GERD medicine of recently.  Notes that the omeprazole was approximately $73 and she is unable to afford this.  She denies prior DVT/PE.  Is on oral contraceptives.  Denies tobacco use.  Denies leg pain or leg swelling.  Denies recent travel/immobilization or surgery.  Denies history of diabetes.  She previously went to the emergency room, but left before being seen.  Chest x-ray was obtained which was normal.  EKG normal sinus rhythm without acute signs of ischemia or infarction.  She denies changing or worsening of symptoms since this time.   HPI  Past Medical History:  Diagnosis Date  . Anemia    iron def  . Anxiety   . Asthma   . Hypertension   . Obesity, unspecified   . PCOS (polycystic ovarian syndrome)   . Positive ANA (antinuclear antibody) 05/05/2017   done by [facility] in [facility]; ANA 1:160, speckled pattern    Patient Active Problem List   Diagnosis Date Noted  . Generalized anxiety disorder with panic attacks 06/20/2018  . Vaginal candidiasis 12/18/2015  . Diarrhea 11/10/2015  . Gastroenteritis 09/24/2015  . Screen for STD  (sexually transmitted disease) 09/21/2015  . Rash and nonspecific skin eruption 08/31/2015  . Dysmenorrhea 08/31/2015  . Allergic rhinitis 05/17/2013  . GERD (gastroesophageal reflux disease) 03/20/2012  . Polycystic ovarian syndrome 12/26/2011  . Abdominal pain 12/26/2011  . Chest pain 11/01/2011  . Family history of cardiac disorder 02/17/2011  . Contraception management 12/30/2010  . Depression, major, single episode, mild ([place]) 09/23/2010  . Essential hypertension 01/27/2010  . ANEMIA, IRON DEFICIENCY 06/27/2009  . Obesity 03/10/2006  . ASTHMA, INTERMITTENT 03/10/2006  . ECZEMA, ATOPIC DERMATITIS 03/10/2006    Past Surgical History:  Procedure Laterality Date  . TONSILLECTOMY      OB History    Gravida  0   Para      Term      Preterm      AB      Living        SAB      TAB      Ectopic      Multiple      Live Births               Home Medications    Prior to Admission medications   Medication Sig Start Date End Date Taking? Authorizing Provider  atenolol (TENORMIN) 25 MG tablet Take 1 tablet (25 mg total) by mouth daily. You may take an extra tablet as needed for palpitations 08/04/18  Yes Sueanne Margarita, MD  ferrous sulfate 325 (65 FE) MG tablet Take 1 tablet (325 mg total) by mouth 2 (two) times daily with a meal. 01/04/19  Yes Bing Neighbors, FNP  lisinopril-hydrochlorothiazide (ZESTORETIC) 20-25 MG tablet Take 1 tablet by mouth daily. 08/10/18  Yes Turner, Cornelious Bryant, MD  PARoxetine (PAXIL) 20 MG tablet Take 1 tablet (20 mg total) by mouth daily. 01/04/19 02/03/19 Yes Pucilowski, Olgierd A, MD  Vitamin D, Ergocalciferol, (DRISDOL) 1.25 MG (50000 UT) CAPS capsule Take 1 capsule (50,000 Units total) by mouth every 7 (seven) days. 12/25/18  Yes Fulp, Cammie, MD  hydrOXYzine (ATARAX/VISTARIL) 25 MG tablet Take 1 tablet (25 mg total) by mouth every 6 (six) hours as needed for anxiety. 01/16/19   Sally-Ann Cutbirth C, PA-C  metoprolol tartrate (LOPRESSOR)  25 MG tablet Take 1 tablet (25 mg total) by mouth once for 1 dose. Take one tablet (25 mg) 2 hours prior to your CT scan. 01/01/19 01/17/19  Quintella Reichert, MD  cetirizine (ZYRTEC) 10 MG tablet Take 1 tablet (10 mg total) by mouth at bedtime. To help with head pressure/congestion 06/23/18 07/16/18  Fulp, Hewitt Shorts, MD  traZODone (DESYREL) 100 MG tablet Take 1 tablet (100 mg total) by mouth at bedtime as needed for sleep. Take 1/2 or 1 tablet at bedtime as needed for sleep. Patient not taking: Reported on 12/04/2018 11/06/18 01/03/19  Pucilowski, Roosvelt Maser, MD    Family History Family History  Problem Relation Age of Onset  . Cancer Father 22       esophagus  . Asthma Father   . Esophageal cancer Father   . Hypertension Mother   . Lupus Mother   . Diabetes Mother   . Cancer Maternal Uncle 40       pancreatic cancer  . Cancer Paternal Grandmother 16       esophageal  . Anxiety disorder Paternal Aunt   . Depression Paternal Aunt   . Anxiety disorder Cousin   . Colon cancer Neg Hx   . Ulcerative colitis Neg Hx     Social History Social History   Tobacco Use  . Smoking status: Never Smoker  . Smokeless tobacco: Never Used  Substance Use Topics  . Alcohol use: Yes    Comment: occasional  . Drug use: Not Currently     Allergies   Diflucan [fluconazole]   Review of Systems Review of Systems  Constitutional: Negative for activity change, appetite change, chills, fatigue and fever.  HENT: Negative for congestion, ear pain, rhinorrhea, sinus pressure, sore throat and trouble swallowing.   Eyes: Negative for discharge and redness.  Respiratory: Positive for shortness of breath. Negative for cough and chest tightness.   Cardiovascular: Positive for chest pain. Negative for palpitations and leg swelling.  Gastrointestinal: Negative for abdominal pain, diarrhea, nausea and vomiting.  Musculoskeletal: Negative for myalgias.  Skin: Negative for rash.  Neurological: Negative for  dizziness, light-headedness and headaches.     Physical Exam Triage Vital Signs ED Triage Vitals  Enc Vitals Group     BP 01/16/19 1611 117/74     Pulse Rate 01/16/19 1611 69     Resp 01/16/19 1611 18     Temp 01/16/19 1611 98.6 F (37 C)     Temp Source 01/16/19 1611 Oral     SpO2 01/16/19 1611 100 %     Weight --      Height --      Head Circumference --      Peak Flow --  Pain Score 01/16/19 1608 4     Pain Loc --      Pain Edu? --      Excl. in GC? --    No data found.  Updated Vital Signs BP 117/74 (BP Location: Right Arm) Comment (BP Location): large cuff  Pulse 69   Temp 98.6 F (37 C) (Oral)   Resp 18   LMP 11/16/2018   SpO2 100%   Visual Acuity Right Eye Distance:   Left Eye Distance:   Bilateral Distance:    Right Eye Near:   Left Eye Near:    Bilateral Near:     Physical Exam Vitals and nursing note reviewed.  Constitutional:      General: She is not in acute distress.    Appearance: She is well-developed.  HENT:     Head: Normocephalic and atraumatic.     Mouth/Throat:     Comments: Oral mucosa pink and moist, no tonsillar enlargement or exudate. Posterior pharynx patent and nonerythematous, no uvula deviation or swelling. Normal phonation. Eyes:     Extraocular Movements: Extraocular movements intact.     Conjunctiva/sclera: Conjunctivae normal.  Cardiovascular:     Rate and Rhythm: Normal rate and regular rhythm.     Heart sounds: No murmur.  Pulmonary:     Effort: Pulmonary effort is normal. No respiratory distress.     Breath sounds: Normal breath sounds.     Comments: Breathing comfortably at rest, CTABL, no wheezing, rales or other adventitious sounds auscultated  No anterior chest tenderness Abdominal:     Palpations: Abdomen is soft.     Tenderness: There is no abdominal tenderness.  Musculoskeletal:     Cervical back: Neck supple.     Comments: Bilateral lower leg symmetric without swelling or erythema, no calf tenderness    Skin:    General: Skin is warm and dry.  Neurological:     Mental Status: She is alert.      UC Treatments / Results  Labs (all labs ordered are listed, but only abnormal results are displayed) Labs Reviewed - No data to display  EKG   Radiology No results found.  Procedures Procedures (including critical care time)  Medications Ordered in UC Medications - No data to display  Initial Impression / Assessment and Plan / UC Course  I have reviewed the triage vital signs and the nursing notes.  Pertinent labs & imaging results that were available during my care of the patient were reviewed by me and considered in my medical decision making (see chart for details).     EKG today remains unchanged from 2 days ago, normal sinus rhythm, no acute signs of ischemia or infarction.  Most likely anxiety versus GERD.  Refilled omeprazole, discussed this may be purchased over-the-counter as well if this is cheaper for her.  Provided hydroxyzine to use as needed for anxiety.  Do not suspect ACS or PE at this time.  Vital signs stable.  Continue to monitor, advised if chest pain changing, worsening, not resolving as he normally does to follow-up in emergency room.  Discussed strict return precautions. Patient verbalized understanding and is agreeable with plan.  Final Clinical Impressions(s) / UC Diagnoses   Final diagnoses:  Atypical chest pain     Discharge Instructions     Ekg normal Please begin omeprazole 20 mg twice daily for the next 2 weeks, you may get this over-the-counter if this is cheaper Please try using hydroxyzine as needed for anxiety, try using  when chest pain comes on, medicine does cause some drowsiness, do not drive or work after taking  Please continue to follow-up with cardiology and proceed with CT as planned.  If you develop chest pain that does not resolve on its own or develop worsening shortness of breath, one-sided leg pain or swelling please follow-up  in emergency room   ED Prescriptions    Medication Sig Dispense Auth. Provider   omeprazole (PRILOSEC) 20 MG capsule Take 1 capsule (20 mg total) by mouth 2 (two) times daily before a meal for 14 days. 28 capsule Prithvi Kooi C, PA-C   hydrOXYzine (ATARAX/VISTARIL) 25 MG tablet Take 1 tablet (25 mg total) by mouth every 6 (six) hours as needed for anxiety. 24 tablet Bryla Burek, Mokuleia C, PA-C     PDMP not reviewed this encounter.   Lew Dawes, PA-C 01/17/19 1332

## 2019-01-17 NOTE — ED Notes (Signed)
Pt was prescribed hydroxyzine for anxiety but has not started taking yet.

## 2019-01-18 ENCOUNTER — Ambulatory Visit (INDEPENDENT_AMBULATORY_CARE_PROVIDER_SITE_OTHER): Payer: Medicaid Other | Admitting: Psychiatry

## 2019-01-18 ENCOUNTER — Encounter: Payer: Self-pay | Admitting: Family Medicine

## 2019-01-18 ENCOUNTER — Ambulatory Visit: Payer: Self-pay | Attending: Family Medicine | Admitting: Family Medicine

## 2019-01-18 DIAGNOSIS — R0789 Other chest pain: Secondary | ICD-10-CM

## 2019-01-18 DIAGNOSIS — K219 Gastro-esophageal reflux disease without esophagitis: Secondary | ICD-10-CM

## 2019-01-18 DIAGNOSIS — F410 Panic disorder [episodic paroxysmal anxiety] without agoraphobia: Secondary | ICD-10-CM

## 2019-01-18 DIAGNOSIS — Z09 Encounter for follow-up examination after completed treatment for conditions other than malignant neoplasm: Secondary | ICD-10-CM

## 2019-01-18 DIAGNOSIS — F411 Generalized anxiety disorder: Secondary | ICD-10-CM

## 2019-01-18 DIAGNOSIS — R52 Pain, unspecified: Secondary | ICD-10-CM

## 2019-01-18 MED ORDER — OMEPRAZOLE 40 MG PO CPDR: 40.0000 mg | DELAYED_RELEASE_CAPSULE | Freq: Two times a day (BID) | ORAL | 3 refills | Status: DC

## 2019-01-18 MED ORDER — TRAZODONE HCL 50 MG PO TABS: 50.0000 mg | ORAL_TABLET | Freq: Every evening | ORAL | 1 refills | Status: DC | PRN

## 2019-01-18 MED ORDER — PAROXETINE HCL 20 MG PO TABS: 20.0000 mg | ORAL_TABLET | Freq: Every day | ORAL | 0 refills | Status: DC

## 2019-01-18 MED FILL — OMEPRAZOLE DR 40 MG CAPSULE: 40 | 30 days supply | Qty: 60 | Fill #0

## 2019-01-18 NOTE — Progress Notes (Signed)
Pt. Stated her whole body hurts with tingling sensation.   Pt. Stated she have knot on the left side of her neck that is causing her pain. Pt. Gets chest pressure sometimes.

## 2019-01-18 NOTE — Progress Notes (Signed)
BH MD/PA/NP OP Progress Note  01/18/2019 3:12 PM Diane Lloyd  MRN:  [id number] Interview was conducted by phone and I verified that I was speaking with the correct person using two identifiers. I discussed the limitations of evaluation and management by telemedicine and  the availability of in person appointments. Patient expressed understanding and agreed to proceed.  Chief Complaint: Anxiety, middle insomnia.  HPI: 29 yo single AAF with hx of generalized anxiety (wories and ruminationsabout current situation with COVID-19 most of all) and infrequent panic attacks. No clear sx of depression. Middle insomnia wasalso present but resolved after anxiety was successfully treated with paroxetine 20 mg and sleep with trazodone.  She has no hx of major depression, mania, psychosis or alcohol/drug addiction. She was briefly in counseling and has never tried psychotropic medications. She has stopped taking paroxetine in November but bu mid December anxiety returned: she started to worry about her health and having episodes of SOB, chest tightness. She want to ED - cardiac workup was negative. We have restarted paroxetine in late December - she has been on it now for about two weeks. Middle insomnia is again present.   Visit Diagnosis:    ICD-10-CM   1. Generalized anxiety disorder with panic attacks  F41.1    F41.0     Past Psychiatric History: Please see intake H&P.  Past Medical History:  Past Medical History:  Diagnosis Date  . Anemia    iron def  . Anxiety   . Asthma   . Hypertension   . Obesity, unspecified   . PCOS (polycystic ovarian syndrome)   . Positive ANA (antinuclear antibody) 05/05/2017   done by [facility] in [facility]; ANA 1:160, speckled pattern    Past Surgical History:  Procedure Laterality Date  . TONSILLECTOMY      Family Psychiatric History: Reviewed.  Family History:  Family History  Problem Relation Age of Onset  . Cancer Father 64       esophagus   . Asthma Father   . Esophageal cancer Father   . Hypertension Mother   . Lupus Mother   . Diabetes Mother   . Cancer Maternal Uncle 40       pancreatic cancer  . Cancer Paternal Grandmother 70       esophageal  . Anxiety disorder Paternal Aunt   . Depression Paternal Aunt   . Anxiety disorder Cousin   . Colon cancer Neg Hx   . Ulcerative colitis Neg Hx     Social History:  Social History   Socioeconomic History  . Marital status: Single    Spouse name: Not on file  . Number of children: 0  . Years of education: Not on file  . Highest education level: Some college, no degree  Occupational History  . Occupation: CSR    Employer: [employer]  Tobacco Use  . Smoking status: Never Smoker  . Smokeless tobacco: Never Used  Substance and Sexual Activity  . Alcohol use: Yes    Comment: occasional  . Drug use: Not Currently  . Sexual activity: Yes    Birth control/protection: None  Other Topics Concern  . Not on file  Social History Narrative   Patient is right-handed. She lives with her mother and uncle in a one level home. She occasionally drinks caffeine. She does not exercise.   Social Determinants of Health   Financial Resource Strain:   . Difficulty of Paying Living Expenses: Not on file  Food Insecurity:   .  Worried About Charity fundraiser in the Last Year: Not on file  . Ran Out of Food in the Last Year: Not on file  Transportation Needs:   . Lack of Transportation (Medical): Not on file  . Lack of Transportation (Non-Medical): Not on file  Physical Activity:   . Days of Exercise per Week: Not on file  . Minutes of Exercise per Session: Not on file  Stress:   . Feeling of Stress : Not on file  Social Connections:   . Frequency of Communication with Friends and Family: Not on file  . Frequency of Social Gatherings with Friends and Family: Not on file  . Attends Religious Services: Not on file  . Active Member of Clubs or Organizations: Not on file  .  Attends Archivist Meetings: Not on file  . Marital Status: Not on file    Allergies:  Allergies  Allergen Reactions  . Diflucan [Fluconazole] Itching and Rash    Metabolic Disorder Labs: Lab Results  Component Value Date   HGBA1C 5.4 12/22/2018   Lab Results  Component Value Date   PROLACTIN 10.7 10/05/2011   Lab Results  Component Value Date   CHOL 135 12/12/2015   TRIG 158 (H) 12/12/2015   HDL 36 (L) 12/12/2015   CHOLHDL 3.8 12/12/2015   VLDL 32 (H) 12/12/2015   LDLCALC 67 12/12/2015   LDLCALC 67 03/17/2011   Lab Results  Component Value Date   TSH 1.150 12/22/2018   TSH 0.927 02/06/2018    Therapeutic Level Labs: No results found for: LITHIUM No results found for: VALPROATE No components found for:  CBMZ  Current Medications: Current Outpatient Medications  Medication Sig Dispense Refill  . atenolol (TENORMIN) 25 MG tablet Take 1 tablet (25 mg total) by mouth daily. You may take an extra tablet as needed for palpitations 45 tablet 6  . ferrous sulfate 325 (65 FE) MG tablet Take 1 tablet (325 mg total) by mouth 2 (two) times daily with a meal. 60 tablet 0  . hydrOXYzine (ATARAX/VISTARIL) 25 MG tablet Take 1 tablet (25 mg total) by mouth every 6 (six) hours as needed for anxiety. (Patient not taking: Reported on 01/18/2019) 24 tablet 0  . lisinopril-hydrochlorothiazide (ZESTORETIC) 20-25 MG tablet Take 1 tablet by mouth daily. 90 tablet 1  . metoprolol tartrate (LOPRESSOR) 25 MG tablet Take 1 tablet (25 mg total) by mouth once for 1 dose. Take one tablet (25 mg) 2 hours prior to your CT scan. 1 tablet 0  . omeprazole (PRILOSEC) 40 MG capsule Take 1 capsule (40 mg total) by mouth 2 (two) times daily. 60 capsule 3  . [START ON 02/02/2019] PARoxetine (PAXIL) 20 MG tablet Take 1 tablet (20 mg total) by mouth daily. 30 tablet 0  . traZODone (DESYREL) 50 MG tablet Take 1 tablet (50 mg total) by mouth at bedtime as needed for sleep. 30 tablet 1  . Vitamin D,  Ergocalciferol, (DRISDOL) 1.25 MG (50000 UT) CAPS capsule Take 1 capsule (50,000 Units total) by mouth every 7 (seven) days. 5 capsule 2   No current facility-administered medications for this visit.     Psychiatric Specialty Exam: Review of Systems  Respiratory: Positive for chest tightness.   Psychiatric/Behavioral: Positive for sleep disturbance. The patient is nervous/anxious.   All other systems reviewed and are negative.   There were no vitals taken for this visit.There is no height or weight on file to calculate BMI.  General Appearance: NA  Eye Contact:  NA  Speech:  Clear and Coherent and Normal Rate  Volume:  Normal  Mood:  Anxious  Affect:  NA  Thought Process:  Goal Directed and Linear  Orientation:  Full (Time, Place, and Person)  Thought Content: Rumination   Suicidal Thoughts:  No  Homicidal Thoughts:  No  Memory:  Immediate;   Good Recent;   Good Remote;   Good  Judgement:  Good  Insight:  Fair  Psychomotor Activity:  NA  Concentration:  Concentration: Good  Recall:  Good  Fund of Knowledge: Good  Language: Good  Akathisia:  Negative  Handed:  Right  AIMS (if indicated): not done  Assets:  Communication Skills Desire for Improvement Housing Physical Health Talents/Skills  ADL's:  Intact  Cognition: WNL  Sleep:  Fair   Screenings: GAD-7     Office Visit from 12/22/2018 in [facility] Office Visit from 06/20/2018 in [facility] Office Visit from 06/15/2018 in [facility] Office Visit from 06/06/2018 in [facility] Office Visit from 04/28/2018 in [facility]  Total GAD-7 Score  0  5  15  5  18     PHQ2-9     Office Visit from 12/22/2018 in [facility] Office Visit from 06/20/2018 in [facility] Office Visit from 06/15/2018 in [facility] Office Visit from 06/06/2018 in [facility] Office Visit from 04/28/2018 in [facility]  PHQ-2 Total Score  0  0  2  2  0  PHQ-9 Total Score  0  1  3  2   0       Assessment and Plan: 29 yo single AAF with hx of generalized anxiety (wories and ruminationsabout current situation with COVID-19 most of all) and infrequent panic attacks. No clear sx of depression. Middle insomnia wasalso present but resolved after anxiety was successfully treated with paroxetine 20 mg and sleep with trazodone.  She has no hx of major depression, mania, psychosis or alcohol/drug addiction. She was briefly in counseling and has never tried psychotropic medications. She has stopped taking paroxetine in November but bu mid December anxiety returned: she started to worry about her health and having episodes of SOB, chest tightness. She want to ED - cardiac workup was negative. We have restarted paroxetine in late December - she has been on it now for about two weeks. Middle insomnia is again present.   Dx: GAD/Panic disorder  Plan: Continue paroxetine 20 mg daily and resume trazodone 50 mg prn insomnia. Next appointment in a month or prn.We may need to increase dose of paroxetine if response is insufficient.The plan was discussed with patient who had an opportunity to ask questions and these were all answered. I spend25 minutes inphone consultation with the patient.    January, MD 01/18/2019, 3:12 PM

## 2019-01-18 NOTE — Progress Notes (Signed)
Virtual Visit via Telephone Note  I connected with Diane Lloyd on 01/18/19 at  9:10 AM EST by telephone and verified that I am speaking with the correct person using two identifiers.   I discussed the limitations, risks, security and privacy concerns of performing an evaluation and management service by telephone and the availability of in person appointments. I also discussed with the patient that there may be a patient responsible charge related to this service. The patient expressed understanding and agreed to proceed.  Patient Location: Home  Provider Location: [facility] office Others participating in call: call initiate by Felecia Shelling, CMA who then transferred the call to me   History of Present Illness:         29 yo female with complaint of onset of sore throat off and on and body aches and chest pressure as if something is sitting on her chest and she is having pain in her mid-upper back- dull ache. She has had 2 weeks of generalized body aches.  She denies fever but has felt as if she is having chills.  She denies sore throat or difficulty swallowing.  She has felt as if she has had some nodules beneath her chin and the neck area.  Areas are soft and nontender.  She is status post emergency department visit yesterday due to chest pain.  She reports that she has not yet picked up acid reflux medication that was suggested that she restart.  She denies abdominal pain but has had nausea as well as increased burping/belching.  She had episode of diarrhea x1 last week.  No blood in the stool or black stools.  No cough or shortness of breath.  No palpitations.  No headaches but patient has had dizziness.  She would like to know what she can take for body aches.  Past Medical History:  Diagnosis Date  . Anemia    iron def  . Anxiety   . Asthma   . Hypertension   . Obesity, unspecified   . PCOS (polycystic ovarian syndrome)   . Positive ANA (antinuclear antibody) 05/05/2017   done by [facility]  Rheumatology in [facility]; ANA 1:160, speckled pattern    Past Surgical History:  Procedure Laterality Date  . TONSILLECTOMY      Family History  Problem Relation Age of Onset  . Cancer Father 46       esophagus  . Asthma Father   . Esophageal cancer Father   . Hypertension Mother   . Lupus Mother   . Diabetes Mother   . Cancer Maternal Uncle 40       pancreatic cancer  . Cancer Paternal Grandmother 46       esophageal  . Anxiety disorder Paternal Aunt   . Depression Paternal Aunt   . Anxiety disorder Cousin   . Colon cancer Neg Hx   . Ulcerative colitis Neg Hx     Social History   Tobacco Use  . Smoking status: Never Smoker  . Smokeless tobacco: Never Used  Substance Use Topics  . Alcohol use: Yes    Comment: occasional  . Drug use: Not Currently     Allergies  Allergen Reactions  . Diflucan [Fluconazole] Itching and Rash       Observations/Objective: No vital signs or physical exam conducted as visit was done via telephone  Assessment and Plan: 1. Gastroesophageal reflux disease, unspecified whether esophagitis present; 2.  Atypical chest pain; 3.  Encounter for examination following treatment at hospital  Review of chart, patient was seen 01/04/2019 in the emergency department for submental adenopathy, atypical substernal chest pain. Her chest pain was thought to be related to acid reflux.  She reports that she has not yet started use of omeprazole.refill provided of omeprazole 40 mg twice daily and patient has been asked to make sure that she avoids known trigger foods and avoids eating within 2 hours of bedtime.  She will have testing for H. pylori at today's visit.  She will also be referred to gastroenterology for further evaluation and treatment. - omeprazole (PRILOSEC) 40 MG capsule; Take 1 capsule (40 mg total) by mouth 2 (two) times daily.  Dispense: 60 capsule; Refill: 3 - Helicobacter pylori abs-IgG+IgA, bld - omeprazole (PRILOSEC) 40 MG capsule; Take 1  capsule (40 mg total) by mouth 2 (two) times daily.  Dispense: 60 capsule; Refill: 3 -Ambulatory referral to gastroenterology  4. Generalized body aches Due to possible acid reflux/gastritis, she is encouraged to take Tylenol as needed for body aches but if this is not effective then she may take an over-the-counter NSAID such as naproxen or ibuprofen but she make sure that she eats prior to taking the medication to avoid further GI upset/stomach irritation.  She has had recent negative Covid testing but if she develops cough, shortness of breath, fever chills or any other concerns she should call regarding Covid testing or return to urgent care or ED for further evaluation.  Follow Up Instructions:Return in about 2 weeks (around 02/01/2019) for GERD/atypical chest pain- sooner if needed; ED if symptoms worsen.     I discussed the assessment and treatment plan with the patient. The patient was provided an opportunity to ask questions and all were answered. The patient agreed with the plan and demonstrated an understanding of the instructions.   The patient was advised to call back or seek an in-person evaluation if the symptoms worsen or if the condition fails to improve as anticipated.  I provided 13 minutes of non-face-to-face time during this encounter.   Antony Blackbird, MD

## 2019-01-22 ENCOUNTER — Other Ambulatory Visit: Payer: Medicaid Other

## 2019-01-22 ENCOUNTER — Other Ambulatory Visit: Payer: Self-pay | Admitting: Cardiology

## 2019-01-24 ENCOUNTER — Other Ambulatory Visit: Payer: Self-pay

## 2019-01-24 ENCOUNTER — Ambulatory Visit: Payer: Self-pay | Attending: Family Medicine | Admitting: Physician Assistant

## 2019-01-24 ENCOUNTER — Encounter: Payer: Self-pay | Admitting: Family Medicine

## 2019-01-24 DIAGNOSIS — J3489 Other specified disorders of nose and nasal sinuses: Secondary | ICD-10-CM

## 2019-01-24 DIAGNOSIS — R591 Generalized enlarged lymph nodes: Secondary | ICD-10-CM

## 2019-01-24 NOTE — Progress Notes (Signed)
Virtual Visit via Telephone Note  I connected with Diane Lloyd on 01/24/19 at 11:10 AM EST by telephone and verified that I am speaking with the correct person using two identifiers.   I discussed the limitations, risks, security and privacy concerns of performing an evaluation and management service by telephone and the availability of in person appointments. I also discussed with the patient that there may be a patient responsible charge related to this service. The patient expressed understanding and agreed to proceed.  PATIENT visit by telephone virtually in the context of Covid-19 pandemic. Patient location:  home My Location:  Remote/home office Persons on the call:  Me and the patient  History of Present Illness:  Patient is c/o lymph node swelling on the L side of her neck X 1 week.  Mild ST on and off.  Had negative covid test last week.  Getting retested today.    No weight loss or night sweats.  She ad temp of 100.1 last week on one day.  No fever since.  Some mild HA and dizziness with sinus pressure in frontal area of head.  No N/V.  Appetite is ok.     Observations/Objective:  NAD.  A&Ox3   Assessment and Plan: 1. Lymphadenopathy Advil, tyelnol.  If spontaneously resolves, can cancel appt..  If persists, appt witll already be made.  Proceed with 2nd covid test today.   - Ambulatory referral to ENT  2. Sinus pressure Sudafed.  Fluids, rest   Follow Up Instructions: See PCP in about 6-8 weeks   I discussed the assessment and treatment plan with the patient. The patient was provided an opportunity to ask questions and all were answered. The patient agreed with the plan and demonstrated an understanding of the instructions.   The patient was advised to call back or seek an in-person evaluation if the symptoms worsen or if the condition fails to improve as anticipated.  I provided 12 minutes of non-face-to-face time during this encounter.   Georgian Co,  PA-C  Patient ID: Diane Lloyd, female   DOB: 1990-06-27, 27 y.o.   MRN: [id number]

## 2019-01-24 NOTE — Progress Notes (Signed)
Pt. Stated she have a bump inside on her left side neck.

## 2019-01-29 ENCOUNTER — Telehealth: Payer: Self-pay | Admitting: Cardiology

## 2019-01-29 ENCOUNTER — Ambulatory Visit (HOSPITAL_COMMUNITY)
Admission: EM | Admit: 2019-01-29 | Discharge: 2019-01-29 | Disposition: A | Discharge status: Home / Self Care | Payer: Medicaid Other | Attending: Family Medicine | Admitting: Family Medicine

## 2019-01-29 ENCOUNTER — Encounter (HOSPITAL_COMMUNITY): Payer: Self-pay | Admitting: Emergency Medicine

## 2019-01-29 ENCOUNTER — Other Ambulatory Visit: Payer: Self-pay

## 2019-01-29 DIAGNOSIS — D509 Iron deficiency anemia, unspecified: Secondary | ICD-10-CM | POA: Diagnosis not present

## 2019-01-29 DIAGNOSIS — Z825 Family history of asthma and other chronic lower respiratory diseases: Secondary | ICD-10-CM | POA: Diagnosis not present

## 2019-01-29 DIAGNOSIS — Z8249 Family history of ischemic heart disease and other diseases of the circulatory system: Secondary | ICD-10-CM | POA: Diagnosis not present

## 2019-01-29 DIAGNOSIS — I10 Essential (primary) hypertension: Secondary | ICD-10-CM | POA: Insufficient documentation

## 2019-01-29 DIAGNOSIS — K219 Gastro-esophageal reflux disease without esophagitis: Secondary | ICD-10-CM | POA: Insufficient documentation

## 2019-01-29 DIAGNOSIS — R000 Tachycardia, unspecified: Secondary | ICD-10-CM | POA: Insufficient documentation

## 2019-01-29 DIAGNOSIS — Z79899 Other long term (current) drug therapy: Secondary | ICD-10-CM | POA: Diagnosis not present

## 2019-01-29 DIAGNOSIS — Z883 Allergy status to other anti-infective agents status: Secondary | ICD-10-CM | POA: Insufficient documentation

## 2019-01-29 DIAGNOSIS — Z833 Family history of diabetes mellitus: Secondary | ICD-10-CM | POA: Insufficient documentation

## 2019-01-29 DIAGNOSIS — Z1159 Encounter for screening for other viral diseases: Secondary | ICD-10-CM

## 2019-01-29 DIAGNOSIS — Z20822 Contact with and (suspected) exposure to covid-19: Secondary | ICD-10-CM | POA: Insufficient documentation

## 2019-01-29 DIAGNOSIS — R519 Headache, unspecified: Secondary | ICD-10-CM

## 2019-01-29 MED ORDER — KETOROLAC TROMETHAMINE 60 MG/2ML IM SOLN
60.0000 mg | Freq: Once | INTRAMUSCULAR | Status: AC
  Administered 2019-01-29: 60 mg via INTRAMUSCULAR

## 2019-01-29 MED ORDER — KETOROLAC TROMETHAMINE 60 MG/2ML IM SOLN
INTRAMUSCULAR | Status: AC
  Filled 2019-01-29: qty 2

## 2019-01-29 NOTE — ED Triage Notes (Signed)
Pt here for a headache that started having headaches about three days ago.  Pt has tried 1000 mg Tylenol Q6H with little to no relief. She states it was in her entire head but now it is focused on the right side of her head. Pt denies any N or V.

## 2019-01-29 NOTE — Telephone Encounter (Signed)
Pt c/o BP issue: STAT if pt c/o blurred vision, one-sided weakness or slurred speech  1. What are your last 5 BP readings?   1/13   104/62   1:20p  1/17   115/71  7am 1/18    94/55   1pm    106/60    12:50p  2. Are you having any other symptoms (ex. Dizziness, headache, blurred vision, passed out)? Dizziness,headache pain on side of head.  3. What is your BP issue? [facility]

## 2019-01-29 NOTE — Discharge Instructions (Signed)
May take Benadryl as well to promote sleep and help with headache.  Go home, rest in quiet dark room. Limit screen time.  Drink plenty of water to ensure adequate hydration.   Excedrin can be helpful for headaches, ibuprofen 800mg  every 8 hours as needed for headaches.  Triggers can include stress, change in sleep or lifestyle, change in diet.  Continue to follow up with your pcp and/or neurologist if recurrent.  Self isolate until covid results are back and negative.  Will notify you by phone of any positive findings. Your negative results will be sent through your MyChart.

## 2019-01-29 NOTE — ED Provider Notes (Signed)
[facility]    CSN: [id number] Arrival date & time: 01/29/19  1950      History   Chief Complaint Chief Complaint  Patient presents with  . Headache    HPI Diane Lloyd is a 29 y.o. female.   Diane Lloyd presents with complaints of headache. Started 3 days ago. Top of head but worse on right side. Ache. Extends into face. Has taken extra strength tylenol every 6 hours and hasn't helped. Last at 4 this afternoon. Has had headaches in the past. Episodes similar started in 2019. Has seen neurology in the past, has had head CT and MRI which were negative. No fevers. Dizziness. Sometimes blurred vision to right eye. No nausea or vomiting. Movement worsens. No light sensitivity. No known ill contacts. Doesn't work outside of the home. Tested for covid-19 last week a few days prior to onset of symptoms which was negative.    ROS per HPI, negative if not otherwise mentioned.      Past Medical History:  Diagnosis Date  . Anemia    iron def  . Anxiety   . Asthma   . Hypertension   . Obesity, unspecified   . PCOS (polycystic ovarian syndrome)   . Positive ANA (antinuclear antibody) 05/05/2017   done by [facility]ology in [facility]; ANA 1:160, speckled pattern    Patient Active Problem List   Diagnosis Date Noted  . Generalized anxiety disorder with panic attacks 06/20/2018  . Vaginal candidiasis 12/18/2015  . Diarrhea 11/10/2015  . Gastroenteritis 09/24/2015  . Screen for STD (sexually transmitted disease) 09/21/2015  . Rash and nonspecific skin eruption 08/31/2015  . Dysmenorrhea 08/31/2015  . Allergic rhinitis 05/17/2013  . GERD (gastroesophageal reflux disease) 03/20/2012  . Polycystic ovarian syndrome 12/26/2011  . Abdominal pain 12/26/2011  . Chest pain 11/01/2011  . Family history of cardiac disorder 02/17/2011  . Contraception management 12/30/2010  . 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 Essential hypertension  01/27/2010  . ANEMIA, IRON DEFICIENCY 06/27/2009  . Obesity 03/10/2006  . ASTHMA, INTERMITTENT 03/10/2006  . ECZEMA, ATOPIC DERMATITIS 03/10/2006    Past Surgical History:  Procedure Laterality Date  . TONSILLECTOMY      OB History    Gravida  0   Para      Term      Preterm      AB      Living        SAB      TAB      Ectopic      Multiple      Live Births               Home Medications    Prior to Admission medications   Medication Sig Start Date End Date Taking? Authorizing Provider  atenolol (TENORMIN) 25 MG tablet Take 1 tablet (25 mg total) by mouth daily. You may take an extra tablet as needed for palpitations 08/04/18  Yes Turner, Cornelious Bryant, MD  ferrous sulfate 325 (65 FE) MG tablet Take 1 tablet (325 mg total) by mouth 2 (two) times daily with a meal. 01/04/19  Yes Bing Neighbors, FNP  lisinopril-hydrochlorothiazide (ZESTORETIC) 20-25 MG tablet Take 1 tablet by mouth daily. Please make annual appt in March with Dr. Mayford Knife. Thank you 01/23/19  Yes Turner, Cornelious Bryant, MD  omeprazole (PRILOSEC) 40 MG capsule Take 1 capsule (40 mg total) by mouth 2 (two) times daily. 01/18/19  Yes Cain Saupe, MD  Vitamin D, Ergocalciferol, (DRISDOL) 1.25 MG (50000 UT) CAPS capsule Take 1 capsule (50,000 Units total) by mouth every 7 (seven) days. 12/25/18  Yes Fulp, Cammie, MD  hydrOXYzine (ATARAX/VISTARIL) 25 MG tablet Take 1 tablet (25 mg total) by mouth every 6 (six) hours as needed for anxiety. Patient not taking: Reported on 01/18/2019 01/16/19   Wieters, Hallie C, PA-C  metoprolol tartrate (LOPRESSOR) 25 MG tablet Take 1 tablet (25 mg total) by mouth once for 1 dose. Take one tablet (25 mg) 2 hours prior to your CT scan. 01/01/19 01/17/19  Quintella Reichert, MD  PARoxetine (PAXIL) 20 MG tablet Take 1 tablet (20 mg total) by mouth daily. Patient not taking: Reported on 01/24/2019 02/02/19 03/04/19  Pucilowski, Roosvelt Maser, MD  traZODone (DESYREL) 50 MG tablet Take 1 tablet (50 mg  total) by mouth at bedtime as needed for sleep. Patient not taking: Reported on 01/24/2019 01/18/19 03/19/19  Pucilowski, Roosvelt Maser, MD  cetirizine (ZYRTEC) 10 MG tablet Take 1 tablet (10 mg total) by mouth at bedtime. To help with head pressure/congestion 06/23/18 07/16/18  Cain Saupe, MD    Family History Family History  Problem Relation Age of Onset  . Cancer Father 74       esophagus  . Asthma Father   . Esophageal cancer Father   . Hypertension Mother   . Lupus Mother   . Diabetes Mother   . Cancer Maternal Uncle 40       pancreatic cancer  . Cancer Paternal Grandmother 66       esophageal  . Anxiety disorder Paternal Aunt   . Depression Paternal Aunt   . Anxiety disorder Cousin   . Colon cancer Neg Hx   . Ulcerative colitis Neg Hx     Social History Social History   Tobacco Use  . Smoking status: Never Smoker  . Smokeless tobacco: Never Used  Substance Use Topics  . Alcohol use: Yes    Comment: occasional  . Drug use: Not Currently     Allergies   Diflucan [fluconazole]   Review of Systems Review of Systems   Physical Exam Triage Vital Signs ED Triage Vitals  Enc Vitals Group     BP 01/29/19 2001 116/77     Pulse Rate 01/29/19 2001 (!) 102     Resp 01/29/19 2001 12     Temp 01/29/19 2001 98.4 F (36.9 C)     Temp src --      SpO2 01/29/19 2001 100 %     Weight --      Height --      Head Circumference --      Peak Flow --      Pain Score 01/29/19 2002 0     Pain Loc --      Pain Edu? --      Excl. in GC? --    No data found.  Updated Vital Signs BP 116/77 (BP Location: Right Arm)   Pulse (!) 102   Temp 98.4 F (36.9 C)   Resp 12   SpO2 100%    Physical Exam Constitutional:      General: She is not in acute distress.    Appearance: She is well-developed.  Eyes:     Extraocular Movements: Extraocular movements intact.     Pupils: Pupils are equal, round, and reactive to light.  Cardiovascular:     Rate and Rhythm: Tachycardia  present.  Pulmonary:     Effort: Pulmonary effort is normal.  Skin:    General: Skin is warm and dry.  Neurological:     Mental Status: She is alert and oriented to person, place, and time.     Cranial Nerves: No cranial nerve deficit or facial asymmetry.     Sensory: No sensory deficit.     Motor: No weakness.     Coordination: Coordination normal.     Gait: Gait normal.      UC Treatments / Results  Labs (all labs ordered are listed, but only abnormal results are displayed) Labs Reviewed  NOVEL CORONAVIRUS, NAA (HOSP ORDER, SEND-OUT TO REF LAB; TAT 18-24 HRS)    EKG   Radiology No results found.  Procedures Procedures (including critical care time)  Medications Ordered in UC Medications  ketorolac (TORADOL) injection 60 mg (has no administration in time range)    Initial Impression / Assessment and Plan / UC Course  I have reviewed the triage vital signs and the nursing notes.  Pertinent labs & imaging results that were available during my care of the patient were reviewed by me and considered in my medical decision making (see chart for details).     No red flag findings on exam. No neurological complaints. Similar to previous bad headaches. toradol provided here in clinic tonight to abort headache. covid testing pending as also with mild tachycardia. Continue to follow with neurology and/or your primary care provider as needed for recurrent migraines. Return precautions provided. Patient verbalized understanding and agreeable to plan.   Final Clinical Impressions(s) / UC Diagnoses   Final diagnoses:  Bad headache     Discharge Instructions     May take Benadryl as well to promote sleep and help with headache.  Go home, rest in quiet dark room. Limit screen time.  Drink plenty of water to ensure adequate hydration.   Excedrin can be helpful for headaches, ibuprofen 800mg  every 8 hours as needed for headaches.  Triggers can include stress, change in sleep  or lifestyle, change in diet.  Continue to follow up with your pcp and/or neurologist if recurrent.  Self isolate until covid results are back and negative.  Will notify you by phone of any positive findings. Your negative results will be sent through your MyChart.        ED Prescriptions    None     PDMP not reviewed this encounter.   Zigmund Gottron, NP 01/29/19 2024

## 2019-01-29 NOTE — Telephone Encounter (Signed)
[facility] Lisinopril HCT to Lisinopril 10mg  daily and check BP twice daily for a week and call with results

## 2019-01-29 NOTE — Telephone Encounter (Signed)
Patient states that her blood pressure has been running low over the past few days.  1/13: 104/62 1/17: 115/71 1/18: 94/55, 106/60, 96/60  Patient reports that she has also been getting right-sided headaches. She also reports some dizziness and feeling like she was going to pass out the other day. Encouraged patient to eat/drink and put her feet up.  I advised her to hold BP meds when systolic is less than 110. She states that in the mornings prior to taking her meds that her systolic is usually between 110-120.

## 2019-01-30 MED ORDER — LISINOPRIL 10 MG PO TABS: 10.0000 mg | ORAL_TABLET | Freq: Every day | ORAL | 11 refills | Status: DC

## 2019-01-30 NOTE — Telephone Encounter (Signed)
I spoke with patient and gave her information from Dr Mayford Knife.  Will send prescription to [facility] at [facility].

## 2019-01-31 ENCOUNTER — Other Ambulatory Visit: Payer: Self-pay

## 2019-01-31 ENCOUNTER — Telehealth: Payer: Self-pay

## 2019-01-31 ENCOUNTER — Ambulatory Visit: Payer: Self-pay | Attending: Family Medicine | Admitting: Physician Assistant

## 2019-01-31 DIAGNOSIS — R519 Headache, unspecified: Secondary | ICD-10-CM

## 2019-01-31 DIAGNOSIS — R0789 Other chest pain: Secondary | ICD-10-CM

## 2019-01-31 DIAGNOSIS — Z09 Encounter for follow-up examination after completed treatment for conditions other than malignant neoplasm: Secondary | ICD-10-CM

## 2019-01-31 MED ORDER — KETOROLAC TROMETHAMINE 10 MG PO TABS: 10.0000 mg | ORAL_TABLET | Freq: Four times a day (QID) | ORAL | 0 refills | Status: DC | PRN

## 2019-01-31 NOTE — Telephone Encounter (Signed)
I spoke to the patient with Dr Norris Cross recommendation to continue Atenolol.  She verbalized understanding.

## 2019-01-31 NOTE — Addendum Note (Signed)
Addended byVidal Schwalbe on: 01/31/2019 10:50 AM   Modules accepted: Orders

## 2019-01-31 NOTE — Progress Notes (Signed)
Headache radiates back of her head.  Pt. Described as a dull ache headache.

## 2019-01-31 NOTE — Progress Notes (Signed)
Virtual Visit via Telephone Note  I connected with Diane Lloyd on 01/31/19 at 11:10 AM EST by telephone and verified that I am speaking with the correct person using two identifiers.   I discussed the limitations, risks, security and privacy concerns of performing an evaluation and management service by telephone and the availability of in person appointments. I also discussed with the patient that there may be a patient responsible charge related to this service. The patient expressed understanding and agreed to proceed.  PATIENT visit by telephone virtually in the context of Covid-19 pandemic. Patient location:home My Location:  [facility] office Persons on the call:  Me and the patient  History of Present Illness:  Patient was seen in the ED/UC for "bad headache."  She has been having HA more frequently over the last month or so.  HA is in Top R side of head and is occurring almost daily.  Tylenol not really helping.  She has had similar HA in the past and neurology work up in 2019.  No meds were given at that time and HA resolved on there own then started again about 1 month ago.  No vision change.  No dizziness.  CT head 06/2018 was neg.  MRI neg 04/2017.  Labs reviewed from 1 month ago and essentially unremarkable.  Mild anemia and vitamin D deficiency.   HA at UC relieved with toradol.       Observations/Objective:  A&Ox3.    Assessment and Plan: 1. Atypical chest pain Being followed by cardiology - Helicobacter pylori abs-IgG+IgA, bld  2. Nonintractable headache, unspecified chronicity pattern, unspecified headache type No red flags.  Call 911 if changes that are emergent - ketorolac (TORADOL) 10 MG tablet; Take 1 tablet (10 mg total) by mouth every 6 (six) hours as needed. Prn headache  Dispense: 20 tablet; Refill: 0 - Ambulatory referral to Neurology  3. Encounter for examination following treatment at hospital Doing well currently/no HA today    Follow Up Instructions: See PCP  in about 6 weeks   I discussed the assessment and treatment plan with the patient. The patient was provided an opportunity to ask questions and all were answered. The patient agreed with the plan and demonstrated an understanding of the instructions.   The patient was advised to call back or seek an in-person evaluation if the symptoms worsen or if the condition fails to improve as anticipated.  I provided 12 minutes of non-face-to-face time during this encounter.   Georgian Co, PA-C  Patient ID: Diane Lloyd, female   DOB: March 18, 1990, 29 y.o.   MRN: [id number]

## 2019-02-01 LAB — NOVEL CORONAVIRUS, NAA (HOSP ORDER, SEND-OUT TO REF LAB; TAT 18-24 HRS): SARS-CoV-2, NAA: NOT DETECTED

## 2019-02-02 LAB — HELICOBACTER PYLORI ABS-IGG+IGA, BLD
H. pylori, IgA Abs: <9 U (ref 0.0–8.9)
H. pylori, IgG AbS: 0.41 {index_val} (ref 0.00–0.79)

## 2019-02-07 ENCOUNTER — Telehealth (HOSPITAL_COMMUNITY): Payer: Self-pay | Admitting: Emergency Medicine

## 2019-02-07 ENCOUNTER — Telehealth: Payer: Self-pay

## 2019-02-07 ENCOUNTER — Encounter (HOSPITAL_COMMUNITY): Payer: Self-pay

## 2019-02-07 NOTE — Telephone Encounter (Signed)
I spoke to the patient and gave her Dr Norris Cross recommendation to f/u with PCP regarding BP.  She verbalized understanding.

## 2019-02-07 NOTE — Telephone Encounter (Signed)
Left message on voicemail with name and callback number Ozie Dimaria RN Navigator Cardiac Imaging [facility] [phone] Office [phone] Cell  

## 2019-02-08 ENCOUNTER — Other Ambulatory Visit: Payer: Self-pay

## 2019-02-08 ENCOUNTER — Ambulatory Visit
Admission: RE | Admit: 2019-02-08 | Discharge: 2019-02-08 | Disposition: A | Discharge status: Home / Self Care | Payer: Self-pay | Source: Ambulatory Visit | Attending: Cardiology | Admitting: Cardiology

## 2019-02-08 DIAGNOSIS — R072 Precordial pain: Secondary | ICD-10-CM

## 2019-02-08 MED ORDER — METOPROLOL TARTRATE 5 MG/5ML IV SOLN
5.0000 mg | INTRAVENOUS | Status: DC | PRN
  Administered 2019-02-08: 5 mg via INTRAVENOUS

## 2019-02-08 MED ORDER — IOHEXOL 350 MG/ML SOLN
125.0000 mL | Freq: Once | INTRAVENOUS | Status: AC | PRN
  Administered 2019-02-08: 125 mL via INTRAVENOUS

## 2019-02-08 MED ORDER — NITROGLYCERIN 0.4 MG SL SUBL
0.8000 mg | SUBLINGUAL_TABLET | Freq: Once | SUBLINGUAL | Status: AC
  Administered 2019-02-08: 0.8 mg via SUBLINGUAL

## 2019-02-08 NOTE — Progress Notes (Signed)
Patient was nervous during CT. Staff talked with patient helped patient with her anxiety and was able to have CT. Patient tolerated CT and drank water after. Ambulatory to exit steady gait.

## 2019-02-21 ENCOUNTER — Telehealth: Payer: Self-pay | Admitting: Family Medicine

## 2019-02-21 ENCOUNTER — Other Ambulatory Visit: Payer: Self-pay | Admitting: Family Medicine

## 2019-02-21 DIAGNOSIS — K219 Gastro-esophageal reflux disease without esophagitis: Secondary | ICD-10-CM

## 2019-02-21 MED ORDER — PANTOPRAZOLE SODIUM 40 MG PO TBEC: 40.0000 mg | DELAYED_RELEASE_TABLET | Freq: Every day | ORAL | 0 refills | Status: DC

## 2019-02-21 NOTE — Telephone Encounter (Signed)
Patient aware.

## 2019-02-21 NOTE — Progress Notes (Signed)
Patient ID: Diane Lloyd, female   DOB: 1990/03/10, 29 y.o.   MRN: [id number]   Patient left voicemail requesting that omeprazole be changed to Protonix that she feels that she has increased congestion with the use of omeprazole.  New prescription for Protonix will be sent to pharmacy and patient will be notified

## 2019-02-21 NOTE — Telephone Encounter (Signed)
Pt called to request a medication change -omeprazole (PRILOSEC) 40 MG capsule To  -Protonix  She states the reason is that she is having a lot of congestion when taking this medication. Please advise  Pharmacy of choice-[facility] - [place], [place] - [address] [employer]

## 2019-02-21 NOTE — Telephone Encounter (Signed)
Notify patient that prescription was sent to [facility] for pantoprazole as per her request

## 2019-02-22 MED FILL — PANTOPRAZOLE SOD DR 40 MG T: 40 | 30 days supply | Qty: 30 | Fill #0

## 2019-02-27 ENCOUNTER — Other Ambulatory Visit: Payer: Self-pay

## 2019-02-27 ENCOUNTER — Ambulatory Visit (INDEPENDENT_AMBULATORY_CARE_PROVIDER_SITE_OTHER): Payer: Medicaid Other | Admitting: Psychiatry

## 2019-02-27 DIAGNOSIS — F411 Generalized anxiety disorder: Secondary | ICD-10-CM

## 2019-02-27 DIAGNOSIS — F410 Panic disorder [episodic paroxysmal anxiety] without agoraphobia: Secondary | ICD-10-CM

## 2019-02-27 MED ORDER — PAROXETINE HCL 30 MG PO TABS: 30.0000 mg | ORAL_TABLET | Freq: Every day | ORAL | 0 refills | Status: DC

## 2019-02-27 MED ORDER — TRAZODONE HCL 50 MG PO TABS: 50.0000 mg | ORAL_TABLET | Freq: Every evening | ORAL | 1 refills | Status: DC | PRN

## 2019-02-27 NOTE — Progress Notes (Signed)
[place] MD/PA/NP OP Progress Note  02/27/2019 2:42 PM Diane Lloyd  MRN:  [id number] Interview was conducted by phone and I verified that I was speaking with the correct person using two identifiers. I discussed the limitations of evaluation and management by telemedicine and  the availability of in person appointments. Patient expressed understanding and agreed to proceed.  Chief Complaint: Anxiety.  HPI: 29 yo single AAF with hx of generalized anxiety and infrequent panic attacks. No clear sx of depression. Middle insomnia wasalso present but resolved after anxiety was successfully treated with paroxetine 20 mg and sleep with trazodone.  She has no hx of major depression, mania, psychosis or alcohol/drug addiction. She was briefly in counseling and has never tried psychotropicmedications. She has stopped taking paroxetine in November but by mid December anxiety returned: she started to worry about her health (has a nodule on her neck since December and worries it may be cancerous growth) and having episodes of SOB, chest tightness. She want to ED - cardiac workup was negative. We have restarted paroxetine in late December - she has been on it now for about two months now but remains anxious. She will see a specialist about her neck/growth soon. Middle insomnia subsided after trazodone was restarted.  Visit Diagnosis:    ICD-10-CM   1. Generalized anxiety disorder with panic attacks  F41.1    F41.0     Past Psychiatric History: Please see intake H&P.  Past Medical History:  Past Medical History:  Diagnosis Date  . Anemia    iron def  . Anxiety   . Asthma   . Hypertension   . Obesity, unspecified   . PCOS (polycystic ovarian syndrome)   . Positive ANA (antinuclear antibody) 05/05/2017   done by [facility] in [facility]; ANA 1:160, speckled pattern    Past Surgical History:  Procedure Laterality Date  . TONSILLECTOMY      Family Psychiatric History: Reviewed.  Family  History:  Family History  Problem Relation Age of Onset  . Cancer Father 56       esophagus  . Asthma Father   . Esophageal cancer Father   . Hypertension Mother   . Lupus Mother   . Diabetes Mother   . Cancer Maternal Uncle 40       pancreatic cancer  . Cancer Paternal Grandmother 86       esophageal  . Anxiety disorder Paternal Aunt   . Depression Paternal Aunt   . Anxiety disorder Cousin   . Colon cancer Neg Hx   . Ulcerative colitis Neg Hx     Social History:  Social History   Socioeconomic History  . Marital status: Single    Spouse name: Not on file  . Number of children: 0  . Years of education: Not on file  . Highest education level: Some college, no degree  Occupational History  . Occupation: CSR    Employer: [employer]  Tobacco Use  . Smoking status: Never Smoker  . Smokeless tobacco: Never Used  Substance and Sexual Activity  . Alcohol use: Yes    Comment: occasional  . Drug use: Not Currently  . Sexual activity: Yes    Birth control/protection: None  Other Topics Concern  . Not on file  Social History Narrative   Patient is right-handed. She lives with her mother and uncle in a one level home. She occasionally drinks caffeine. She does not exercise.   Social Determinants of Health   Financial Resource Strain:   .  Difficulty of Paying Living Expenses: Not on file  Food Insecurity:   . Worried About Programme researcher, broadcasting/film/video in the Last Year: Not on file  . Ran Out of Food in the Last Year: Not on file  Transportation Needs:   . Lack of Transportation (Medical): Not on file  . Lack of Transportation (Non-Medical): Not on file  Physical Activity:   . Days of Exercise per Week: Not on file  . Minutes of Exercise per Session: Not on file  Stress:   . Feeling of Stress : Not on file  Social Connections:   . Frequency of Communication with Friends and Family: Not on file  . Frequency of Social Gatherings with Friends and Family: Not on file  . Attends  Religious Services: Not on file  . Active Member of Clubs or Organizations: Not on file  . Attends Banker Meetings: Not on file  . Marital Status: Not on file    Allergies:  Allergies  Allergen Reactions  . Diflucan [Fluconazole] Itching and Rash    Metabolic Disorder Labs: Lab Results  Component Value Date   HGBA1C 5.4 12/22/2018   Lab Results  Component Value Date   PROLACTIN 10.7 10/05/2011   Lab Results  Component Value Date   CHOL 135 12/12/2015   TRIG 158 (H) 12/12/2015   HDL 36 (L) 12/12/2015   CHOLHDL 3.8 12/12/2015   VLDL 32 (H) 12/12/2015   LDLCALC 67 12/12/2015   LDLCALC 67 03/17/2011   Lab Results  Component Value Date   TSH 1.150 12/22/2018   TSH 0.927 02/06/2018    Therapeutic Level Labs: No results found for: LITHIUM No results found for: VALPROATE No components found for:  CBMZ  Current Medications: Current Outpatient Medications  Medication Sig Dispense Refill  . atenolol (TENORMIN) 25 MG tablet Take 1 tablet (25 mg total) by mouth daily. You may take an extra tablet as needed for palpitations 45 tablet 6  . ferrous sulfate 325 (65 FE) MG tablet Take 1 tablet (325 mg total) by mouth 2 (two) times daily with a meal. 60 tablet 0  . hydrOXYzine (ATARAX/VISTARIL) 25 MG tablet Take 1 tablet (25 mg total) by mouth every 6 (six) hours as needed for anxiety. (Patient not taking: Reported on 01/18/2019) 24 tablet 0  . ketorolac (TORADOL) 10 MG tablet Take 1 tablet (10 mg total) by mouth every 6 (six) hours as needed. Prn headache 20 tablet 0  . lisinopril (ZESTRIL) 10 MG tablet Take 1 tablet (10 mg total) by mouth daily. 30 tablet 11  . metoprolol tartrate (LOPRESSOR) 25 MG tablet Take 1 tablet (25 mg total) by mouth once for 1 dose. Take one tablet (25 mg) 2 hours prior to your CT scan. 1 tablet 0  . pantoprazole (PROTONIX) 40 MG tablet Take 1 tablet (40 mg total) by mouth daily. To reduce stomach acid 90 tablet 0  . PARoxetine (PAXIL) 30 MG  tablet Take 1 tablet (30 mg total) by mouth daily. 30 tablet 0  . traZODone (DESYREL) 50 MG tablet Take 1 tablet (50 mg total) by mouth at bedtime as needed for sleep. 30 tablet 1  . Vitamin D, Ergocalciferol, (DRISDOL) 1.25 MG (50000 UT) CAPS capsule Take 1 capsule (50,000 Units total) by mouth every 7 (seven) days. 5 capsule 2   No current facility-administered medications for this visit.     Psychiatric Specialty Exam: Review of Systems  Psychiatric/Behavioral: The patient is nervous/anxious.   All other systems reviewed  and are negative.   There were no vitals taken for this visit.There is no height or weight on file to calculate BMI.  General Appearance: NA  Eye Contact:  NA  Speech:  Clear and Coherent and Normal Rate  Volume:  Normal  Mood:  Anxious  Affect:  NA  Thought Process:  Goal Directed and Linear  Orientation:  Full (Time, Place, and Person)  Thought Content: Rumination   Suicidal Thoughts:  No  Homicidal Thoughts:  No  Memory:  Immediate;   Good Recent;   Good Remote;   Good  Judgement:  Good  Insight:  Fair  Psychomotor Activity:  NA  Concentration:  Concentration: Good  Recall:  Good  Fund of Knowledge: Good  Language: Good  Akathisia:  Negative  Handed:  Right  AIMS (if indicated): not done  Assets:  Communication Skills Desire for Improvement Housing  ADL's:  Intact  Cognition: WNL  Sleep:  Fair   Screenings: GAD-7     Office Visit from 12/22/2018 in [facility] Office Visit from 06/20/2018 in [facility] Office Visit from 06/15/2018 in [facility] Office Visit from 06/06/2018 in [facility] Office Visit from 04/28/2018 in [facility]  Total GAD-7 Score  0  5  15  5  18     PHQ2-9     Office Visit from 12/22/2018 in [facility] Office Visit from 06/20/2018 in [facility] Office Visit from 06/15/2018 in [facility] Office Visit from 06/06/2018 in [facility] Office Visit from 04/28/2018 in [facility]  PHQ-2 Total Score  0  0  2  2  0  PHQ-9 Total Score  0  1  3  2   0       Assessment and Plan: 29 yo single AAF with hx of generalized anxiety and infrequent panic attacks. No clear sx of depression. Middle insomnia wasalso present but resolved after anxiety was successfully treated with paroxetine 20 mg and sleep with trazodone.  She has no hx of major depression, mania, psychosis or alcohol/drug addiction. She was briefly in counseling and has never tried psychotropicmedications. She has stopped taking paroxetine in November but by mid December anxiety returned: she started to worry about her health (has a nodule on her neck since December and worries it may be cancerous growth) and having episodes of SOB, chest tightness. She want to ED - cardiac workup was negative. We have restarted paroxetine in late December - she has been on it now for about two months now but remains anxious. She will see a specialist about her neck/growth soon. Middle insomnia subsided after trazodone was restarted.    Dx: GAD/Panic disorder  Plan: Increase paroxetine to 30 mg daily and continue trazodone 50 mg prn insomnia.Next appointment in a month or prn. The plan was discussed with patient who had an opportunity to ask questions and these were all answered. I spend25 minutes inphone consultation with the patient.   January, MD 02/27/2019, 2:42 PM

## 2019-03-01 ENCOUNTER — Ambulatory Visit: Payer: Medicaid Other | Attending: Family Medicine | Admitting: Physician Assistant

## 2019-03-01 ENCOUNTER — Other Ambulatory Visit: Payer: Self-pay

## 2019-03-01 DIAGNOSIS — J029 Acute pharyngitis, unspecified: Secondary | ICD-10-CM

## 2019-03-01 NOTE — Progress Notes (Signed)
Virtual Visit via Telephone Note  I connected with Diane Lloyd on 03/01/19 at 10:50 AM EST by telephone and verified that I am speaking with the correct person using two identifiers.   I discussed the limitations, risks, security and privacy concerns of performing an evaluation and management service by telephone and the availability of in person appointments. I also discussed with the patient that there may be a patient responsible charge related to this service. The patient expressed understanding and agreed to proceed.  PATIENT visit by telephone virtually in the context of Covid-19 pandemic. Patient location:  home My Location:  [facility] office Persons on the call:  Me and the patient  History of Present Illness:  Has an appt with ENT on Tuesday.  Still having post nasal drip and difficulty swallowing at times.  Appetite is good.  No fever.  No N/V/D.  No urinary s/sx.  Gets covid testing weekly at work.  No HA/CP.  Throat feels sore.  Voice is not hoarse.    Observations/Objective:  NAD  Assessment and Plan: 1. Sore throat Salt water gargles tid and advil or tylenol per pkg instructions.  Keep ENT appt.    Follow Up Instructions: 3 month f/up with PCP   I discussed the assessment and treatment plan with the patient. The patient was provided an opportunity to ask questions and all were answered. The patient agreed with the plan and demonstrated an understanding of the instructions.   The patient was advised to call back or seek an in-person evaluation if the symptoms worsen or if the condition fails to improve as anticipated.  I provided 7 minutes of non-face-to-face time during this encounter.   Georgian Co, PA-C  Patient ID: Diane Lloyd, female   DOB: 1990-11-04, 29 y.o.   MRN: [id number]

## 2019-03-01 NOTE — Progress Notes (Signed)
DOB and name verified C/o shore throat, postnasal drip,abdominal pain. Headache , body aches x 4 day Made provider aware of GAD AND PHQ9 screening

## 2019-03-03 ENCOUNTER — Encounter (HOSPITAL_COMMUNITY): Payer: Self-pay

## 2019-03-03 ENCOUNTER — Other Ambulatory Visit: Payer: Self-pay

## 2019-03-03 ENCOUNTER — Ambulatory Visit (HOSPITAL_COMMUNITY)
Admission: EM | Admit: 2019-03-03 | Discharge: 2019-03-03 | Disposition: A | Discharge status: Home / Self Care | Payer: Medicaid Other | Attending: Family Medicine | Admitting: Family Medicine

## 2019-03-03 DIAGNOSIS — R0989 Other specified symptoms and signs involving the circulatory and respiratory systems: Secondary | ICD-10-CM

## 2019-03-03 DIAGNOSIS — R09A2 Foreign body sensation, throat: Secondary | ICD-10-CM

## 2019-03-03 MED ORDER — CETIRIZINE HCL 10 MG PO TABS: 10.0000 mg | ORAL_TABLET | Freq: Every day | ORAL | 0 refills | Status: DC

## 2019-03-03 NOTE — ED Provider Notes (Signed)
[facility]    CSN: [id number] Arrival date & time: 03/03/19  1213      History   Chief Complaint Chief Complaint  Patient presents with   Dysphagia    HPI Diane Lloyd is a 29 y.o. female.   Patient is a 29 year old female past medical history of anxiety, anemia, asthma, hypertension, obesity, PCOS, GERD.  She presents today with foreign body sensation in throat x6 days.  She is also reporting some lymphadenopathy.  This lymphadenopathy has been present since December, off and on.  She is not have any trouble swallowing or pain with swallowing. Doesn't want to eat and has been very anxious about it. She has also had some numbness and tingling in her hands and feet at times.   Denies any fever, chills, body aches or night sweats.  She was recently seen by her psychiatrist on the 16th where they restarted her paroxetine at 30 mg.  Note was reviewed.  She was also seen by family practitioner on the 18th. Reports that she is not taking her anxiety medications. Scheduled to see ENT on Tuesday of next week.   ROS per HPI      Past Medical History:  Diagnosis Date   Anemia    iron def   Anxiety    Asthma    Hypertension    Obesity, unspecified    PCOS (polycystic ovarian syndrome)    Positive ANA (antinuclear antibody) 05/05/2017   done by [facility] in [facility]; ANA 1:160, speckled pattern    Patient Active Problem List   Diagnosis Date Noted   Generalized anxiety disorder with panic attacks 06/20/2018   Vaginal candidiasis 12/18/2015   Diarrhea 11/10/2015   Gastroenteritis 09/24/2015   Screen for STD (sexually transmitted disease) 09/21/2015   Rash and nonspecific skin eruption 08/31/2015   Dysmenorrhea 08/31/2015   Allergic rhinitis 05/17/2013   GERD (gastroesophageal reflux disease) 03/20/2012   Polycystic ovarian syndrome 12/26/2011   Abdominal pain 12/26/2011   Chest pain 11/01/2011   Family history of cardiac disorder  02/17/2011   Contraception management 12/30/2010   Depression, major, single episode, 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/23/2010   Essential hypertension 01/27/2010   ANEMIA, IRON DEFICIENCY 06/27/2009   Obesity 03/10/2006   ASTHMA, INTERMITTENT 03/10/2006   ECZEMA, ATOPIC DERMATITIS 03/10/2006    Past Surgical History:  Procedure Laterality Date   TONSILLECTOMY      OB History    Gravida  0   Para      Term      Preterm      AB      Living        SAB      TAB      Ectopic      Multiple      Live Births               Home Medications    Prior to Admission medications   Medication Sig Start Date End Date Taking? Authorizing Provider  atenolol (TENORMIN) 25 MG tablet Take 1 tablet (25 mg total) by mouth daily. You may take an extra tablet as needed for palpitations 08/04/18  Yes Turner, Tami Barren R, MD  lisinopril (ZESTRIL) 10 MG tablet Take 1 tablet (10 mg total) by mouth daily. 01/30/19  Yes Turner, Cornelious Bryant, MD  pantoprazole (PROTONIX) 40 MG tablet Take 1 tablet (40 mg total) by mouth daily. To reduce stomach acid 02/21/19  Yes Fulp, Cammie, MD  cetirizine (ZYRTEC) 10 MG tablet  Take 1 tablet (10 mg total) by mouth daily. 03/03/19   Dahlia Byes A, NP  ferrous sulfate 325 (65 FE) MG tablet Take 1 tablet (325 mg total) by mouth 2 (two) times daily with a meal. Patient not taking: Reported on 03/01/2019 01/04/19 03/03/19  Bing Neighbors, FNP  metoprolol tartrate (LOPRESSOR) 25 MG tablet Take 1 tablet (25 mg total) by mouth once for 1 dose. Take one tablet (25 mg) 2 hours prior to your CT scan. 01/01/19 03/03/19  Quintella Reichert, MD  PARoxetine (PAXIL) 30 MG tablet Take 1 tablet (30 mg total) by mouth daily. Patient not taking: Reported on 03/01/2019 02/27/19 03/03/19  Pucilowski, Roosvelt Maser, MD  traZODone (DESYREL) 50 MG tablet Take 1 tablet (50 mg total) by mouth at bedtime as needed for sleep. Patient not taking: Reported on 03/01/2019 02/27/19 03/03/19  Pucilowski, Roosvelt Maser, MD     Family History Family History  Problem Relation Age of Onset   Cancer Father 28       esophagus   Asthma Father    Esophageal cancer Father    Hypertension Mother    Lupus Mother    Diabetes Mother    Cancer Maternal Uncle 23       pancreatic cancer   Cancer Paternal Grandmother 75       esophageal   Anxiety disorder Paternal Aunt    Depression Paternal Aunt    Anxiety disorder Cousin    Colon cancer Neg Hx    Ulcerative colitis Neg Hx     Social History Social History   Tobacco Use   Smoking status: Never Smoker   Smokeless tobacco: Never Used  Substance Use Topics   Alcohol use: Yes    Comment: occasional   Drug use: Not Currently     Allergies   Diflucan [fluconazole]   Review of Systems Review of Systems   Physical Exam Triage Vital Signs ED Triage Vitals  Enc Vitals Group     BP 03/03/19 1238 114/72     Pulse Rate 03/03/19 1238 74     Resp 03/03/19 1238 18     Temp 03/03/19 1238 98.2 F (36.8 C)     Temp Source 03/03/19 1238 Oral     SpO2 03/03/19 1238 99 %     Weight --      Height --      Head Circumference --      Peak Flow --      Pain Score 03/03/19 1235 0     Pain Loc --      Pain Edu? --      Excl. in GC? --    No data found.  Updated Vital Signs BP 114/72 (BP Location: Left Arm)    Pulse 74    Temp 98.2 F (36.8 C) (Oral)    Resp 18    LMP 11/28/2018 (Approximate)    SpO2 99%   Visual Acuity Right Eye Distance:   Left Eye Distance:   Bilateral Distance:    Right Eye Near:   Left Eye Near:    Bilateral Near:     Physical Exam Vitals and nursing note reviewed.  Constitutional:      General: She is not in acute distress.    Appearance: Normal appearance. She is not ill-appearing, toxic-appearing or diaphoretic.  HENT:     Head: Normocephalic.     Nose: Nose normal.     Mouth/Throat:     Pharynx: Oropharynx is clear.  Eyes:  Conjunctiva/sclera: Conjunctivae normal.  Pulmonary:     Effort:  Pulmonary effort is normal.  Musculoskeletal:        General: Normal range of motion.     Cervical back: Normal range of motion.  Skin:    General: Skin is warm and dry.     Findings: No rash.  Neurological:     Mental Status: She is alert.  Psychiatric:     Comments: Anxious       UC Treatments / Results  Labs (all labs ordered are listed, but only abnormal results are displayed) Labs Reviewed - No data to display  EKG   Radiology No results found.  Procedures Procedures (including critical care time)  Medications Ordered in UC Medications - No data to display  Initial Impression / Assessment and Plan / UC Course  I have reviewed the triage vital signs and the nursing notes.  Pertinent labs & imaging results that were available during my care of the patient were reviewed by me and considered in my medical decision making (see chart for details).     Foreign body sensation- most likely related to acid reflux or allergies Recommended zyrtec and continue the reflux medication. watch diet  She has an appointment to see the specialist on Tuesday.  Pt has a lot of anxiety about all of this Recommended she take her anxiety medication as prescribed.  Pt agreed Final Clinical Impressions(s) / UC Diagnoses   Final diagnoses:  Foreign body sensation in throat     Discharge Instructions     I believe your symptoms are most likely related to allergies or acid reflux.  Keep taking your acid reflux medication.  I am sending some Zyrtec to the pharmacy start taking that every day. Keep watching your diet as you have been Follow-up with a specialist on Tuesday as planned     ED Prescriptions    Medication Sig Dispense Auth. Provider   cetirizine (ZYRTEC) 10 MG tablet Take 1 tablet (10 mg total) by mouth daily. 30 tablet Loura Halt A, NP     PDMP not reviewed this encounter.   Orvan July, NP 03/04/19 [phone]

## 2019-03-03 NOTE — ED Triage Notes (Signed)
Pt c/o HA, body aches, general abd pain with nausea and difficulty swallowing for past several days. Denies cough, sore throat, vomiting, diarrhea, loss of taste/smell, fever, chills.  States "feels like something stuck in my throat when I swallow." Also reports that her lymph nodes under her chin have been "swollen" since December and a "lump" to left side of neck.

## 2019-03-03 NOTE — Discharge Instructions (Signed)
I believe your symptoms are most likely related to allergies or acid reflux.  Keep taking your acid reflux medication.  I am sending some Zyrtec to the pharmacy start taking that every day. Keep watching your diet as you have been Follow-up with a specialist on Tuesday as planned

## 2019-03-05 ENCOUNTER — Ambulatory Visit: Payer: No Typology Code available for payment source | Admitting: Family Medicine

## 2019-03-06 ENCOUNTER — Telehealth: Payer: Self-pay | Admitting: Family Medicine

## 2019-03-06 NOTE — Telephone Encounter (Signed)
Patient called stating her blood pressure is high requested to send message to provider. Please call her at [phone].

## 2019-03-07 ENCOUNTER — Ambulatory Visit: Payer: Self-pay | Admitting: Neurology

## 2019-03-07 NOTE — Telephone Encounter (Signed)
Please see if patient can be seen by Franky Macho or myself this week or with Zonia Kief in follow-up of her blood pressure

## 2019-03-07 NOTE — Telephone Encounter (Signed)
Returned call / Pt stated changing her diet / adhering to vegan diet for the past week and so. Concerned about BP meds intake at this time. Stated her pressure readings fluctuate from 139/89 to low 91/50. Today readings were 102/69/  Please advice!

## 2019-03-07 NOTE — Telephone Encounter (Signed)
Could you please make an app for [facility] or NP Zonia Kief Amy this week . TKS

## 2019-03-08 ENCOUNTER — Ambulatory Visit: Payer: Medicaid Other | Admitting: Pharmacist

## 2019-03-09 ENCOUNTER — Telehealth: Payer: Self-pay | Admitting: Cardiology

## 2019-03-09 ENCOUNTER — Ambulatory Visit: Payer: Medicaid Other | Attending: Family Medicine | Admitting: Pharmacist

## 2019-03-09 ENCOUNTER — Other Ambulatory Visit: Payer: Self-pay

## 2019-03-09 VITALS — BP 105/72 | HR 57

## 2019-03-09 DIAGNOSIS — Z7901 Long term (current) use of anticoagulants: Secondary | ICD-10-CM | POA: Diagnosis not present

## 2019-03-09 DIAGNOSIS — Z79899 Other long term (current) drug therapy: Secondary | ICD-10-CM | POA: Diagnosis not present

## 2019-03-09 DIAGNOSIS — I10 Essential (primary) hypertension: Secondary | ICD-10-CM | POA: Insufficient documentation

## 2019-03-09 NOTE — Patient Instructions (Signed)
Thank you for coming to see [place] today.   Blood pressure today is normal.  Continue taking the atenolol. You can hold off on the lisinopril.   Limiting salt and caffeine, as well as exercising as able for at least 30 minutes for 5 days out of the week, can also help you lower your blood pressure.  Take your blood pressure at home if you are able. Please write down these numbers and bring them to your visits.  If you have any questions about medications, please call me [phone].  Franky Macho

## 2019-03-09 NOTE — Telephone Encounter (Signed)
Agree with plan - the atenolol will help suppress the PVCs

## 2019-03-09 NOTE — Telephone Encounter (Signed)
Patient states that she was taken off of lisinopril by her PCP a couple of days ago due to having low blood pressures with diastolic readings in the 90s. Since coming off of lisinopril her readings have been 122/71,105/72, 121/71, and 124/68. She is wondering if she needs to stay on atenolol or not. I advised her that those blood pressure readings were good and that her atenolol was also helping to suppress her palpitations. I advised her to continue taking atenolol and continue to take her blood pressure daily. If readings drop again she will call us back. Patient verbalized understanding.

## 2019-03-09 NOTE — Telephone Encounter (Signed)
Pt c/o medication issue:  1. Name of Medication:  atenolol (TENORMIN) 25 MG tablet lisinopril (ZESTRIL) 10 MG tablet  2. How are you currently taking this medication (dosage and times per day)? Once a day  3. Are you having a reaction (difficulty breathing--STAT)? no  4. What is your medication issue? Patient states her PCP took her off her lisinopril, because her BP kept dropping. She would like to know what to do about her atenolol as well. Please advise.

## 2019-03-09 NOTE — Progress Notes (Signed)
   S:    Patient arrives in good spirits.  Presents to the clinic for BP check.  Patient was referred on 03/06/2019.    Patient denies adherence with medications. She is holding the lisinopril d/t low blood pressures.   Current BP Medications include:  Atenolol 25 mg daily, lisinopril 10 mg daily (holding d/t low blood pressure at home)  Dietary habits include: has changed to a vegan diet  Exercise habits include: none reported  Family / Social history:  -FHx: anxiety, asthma, DM, HTN - Tobacco: never smoker  - Alcohol: occasional use   O:  Vitals:   03/09/19 1347  BP: 105/72  Pulse: (!) 57    Home BP readings:  - Reports that since she stopped lisinopril, BP at home stays in the 120s/70s - When both are taken together she reports readings in 90s/60s  Last 3 Office BP readings: BP Readings from Last 3 Encounters:  03/09/19 105/72  03/03/19 114/72  02/08/19 126/89    BMET    Component Value Date/Time   NA 134 (L) 01/02/2019 2321   NA 137 12/22/2018 1026   K 3.7 01/02/2019 2321   CL 100 01/02/2019 2321   CO2 25 01/02/2019 2321   GLUCOSE 102 (H) 01/02/2019 2321   BUN 10 01/02/2019 2321   BUN 10 12/22/2018 1026   CREATININE 0.91 01/02/2019 2321   CREATININE 0.86 12/12/2015 1441   CALCIUM 9.5 01/02/2019 2321   GFRNONAA >60 01/02/2019 2321   GFRNONAA >89 12/12/2015 1441   GFRAA >60 01/02/2019 2321   GFRAA >89 12/12/2015 1441    Renal function: CrCl cannot be calculated (Patient's most recent lab result is older than the maximum 21 days allowed.).  Clinical ASCVD: No  The ASCVD Risk score Denman George DC Jr., et al., 2013) failed to calculate for the following reasons:   The 2013 ASCVD risk score is only valid for ages 32 to 53   A/P: Hypertension longstanding currently controlled to low on current medications. BP Goal = <130/80 mmHg. Patient is holding lisinopril but continuing atenolol. Advised her to continue with this and reach out to her Cardiologist for further  managment.  -Discontinued lisinopril. -Continued atenolol 25 mg daily.  -Counseled on lifestyle modifications for blood pressure control including reduced dietary sodium, increased exercise, adequate sleep  Results reviewed and written information provided.   Total time in face-to-face counseling 15 minutes.   F/U Clinic Visit with PCP.   Butch Penny, PharmD, CPP Clinical Pharmacist [facility] [phone]

## 2019-03-13 ENCOUNTER — Ambulatory Visit: Payer: Medicaid Other | Admitting: Gastroenterology

## 2019-03-21 ENCOUNTER — Other Ambulatory Visit: Payer: Self-pay | Admitting: Otolaryngology

## 2019-03-21 ENCOUNTER — Ambulatory Visit
Admission: RE | Admit: 2019-03-21 | Discharge: 2019-03-21 | Disposition: A | Discharge status: Home / Self Care | Payer: Self-pay | Source: Ambulatory Visit | Attending: Otolaryngology | Admitting: Otolaryngology

## 2019-03-21 DIAGNOSIS — Q892 Congenital malformations of other endocrine glands: Secondary | ICD-10-CM

## 2019-03-21 DIAGNOSIS — R590 Localized enlarged lymph nodes: Secondary | ICD-10-CM

## 2019-03-26 ENCOUNTER — Ambulatory Visit (HOSPITAL_COMMUNITY): Payer: Medicaid Other | Admitting: Psychiatry

## 2019-03-27 ENCOUNTER — Other Ambulatory Visit (HOSPITAL_COMMUNITY): Payer: Self-pay | Admitting: Psychiatry

## 2019-03-28 ENCOUNTER — Other Ambulatory Visit: Payer: Self-pay

## 2019-03-28 ENCOUNTER — Ambulatory Visit (HOSPITAL_COMMUNITY): Payer: Medicaid Other | Admitting: Psychiatry

## 2019-03-30 ENCOUNTER — Other Ambulatory Visit: Payer: Self-pay | Admitting: Family Medicine

## 2019-03-30 DIAGNOSIS — K219 Gastro-esophageal reflux disease without esophagitis: Secondary | ICD-10-CM

## 2019-03-30 MED ORDER — PANTOPRAZOLE SODIUM 40 MG PO TBEC: 40.0000 mg | DELAYED_RELEASE_TABLET | Freq: Every day | ORAL | 0 refills | Status: DC

## 2019-03-30 MED FILL — PANTOPRAZOLE SOD DR 40 MG T: 40 | 30 days supply | Qty: 30 | Fill #0

## 2019-04-04 ENCOUNTER — Ambulatory Visit (HOSPITAL_COMMUNITY)
Admission: EM | Admit: 2019-04-04 | Discharge: 2019-04-04 | Disposition: A | Discharge status: Home / Self Care | Payer: 59 | Attending: Emergency Medicine | Admitting: Emergency Medicine

## 2019-04-04 ENCOUNTER — Other Ambulatory Visit: Payer: Self-pay

## 2019-04-04 ENCOUNTER — Ambulatory Visit: Payer: Medicaid Other | Admitting: Family Medicine

## 2019-04-04 ENCOUNTER — Encounter (HOSPITAL_COMMUNITY): Payer: Self-pay

## 2019-04-04 DIAGNOSIS — R0789 Other chest pain: Secondary | ICD-10-CM

## 2019-04-04 MED ORDER — TIZANIDINE HCL 4 MG PO TABS: 4.0000 mg | ORAL_TABLET | Freq: Three times a day (TID) | ORAL | 0 refills | Status: DC | PRN

## 2019-04-04 MED ORDER — NAPROXEN 500 MG PO TABS: 500.0000 mg | ORAL_TABLET | Freq: Two times a day (BID) | ORAL | 0 refills | Status: DC

## 2019-04-04 NOTE — ED Triage Notes (Signed)
Pt reports chest pain starting two days ago. Pain is intermittent. Switches locations between left shoulder/chest area and other times it will be under the left breast. Occasionally extends to right chest. Also endorses headaches and dizziness.

## 2019-04-04 NOTE — ED Provider Notes (Signed)
[place]    CSN: [id number] Arrival date & time: 04/04/19  4259      History   Chief Complaint Chief Complaint  Patient presents with  . Chest Pain    HPI Diane Lloyd is a 29 y.o. female history of hypertension, PCOS, asthma, GERD, presenting today for evaluation of chest pain.  Patient notes that she developed chest pain to her left chest approximately 2 days ago.  She has felt it underneath her left breast as well as in the right side as well.  Pain is intermittent, will wax and wane.  Experiences frequently throughout the day and will last for approximately 5 minutes.  She denies any specific trigger, denies worsening with exertion, movement, around mealtimes.  Has had some mild associated nausea.  Denies shortness of breath.  Has had headaches and dizziness which have been more prominent with this as well.  Denies vision changes.  Using oral Toradol for this.  Follows up with cardiology and takes atenolol for palpitations, lisinopril for hypertension.  Denies tobacco use.  Denies cocaine use.  Denies family history of death from MI at an early age.  Denies diabetes history.  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  HPI  Past Medical History:  Diagnosis Date  . Anemia    iron def  . Anxiety   . Asthma   . Hypertension   . Obesity, unspecified   . PCOS (polycystic ovarian syndrome)   . Positive ANA (antinuclear antibody) 05/05/2017   done by [facility] in [facility]; ANA 1:160, speckled pattern    Patient Active Problem List   Diagnosis Date Noted  . Generalized anxiety disorder with panic attacks 06/20/2018  . Vaginal candidiasis 12/18/2015  . Diarrhea 11/10/2015  . Gastroenteritis 09/24/2015  . Screen for STD (sexually transmitted disease) 09/21/2015  . Rash and nonspecific skin eruption 08/31/2015  . Dysmenorrhea 08/31/2015  . Allergic rhinitis 05/17/2013  . GERD  (gastroesophageal reflux disease) 03/20/2012  . Polycystic ovarian syndrome 12/26/2011  . Abdominal pain 12/26/2011  . Chest pain 11/01/2011  . Family history of cardiac disorder 02/17/2011  . Contraception management 12/30/2010  . 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 Essential hypertension 01/27/2010  . ANEMIA, IRON DEFICIENCY 06/27/2009  . Obesity 03/10/2006  . ASTHMA, INTERMITTENT 03/10/2006  . ECZEMA, ATOPIC DERMATITIS 03/10/2006    Past Surgical History:  Procedure Laterality Date  . TONSILLECTOMY      OB History    Gravida  0   Para      Term      Preterm      AB      Living        SAB      TAB      Ectopic      Multiple      Live Births               Home Medications    Prior to Admission medications   Medication Sig Start Date End Date Taking? Authorizing Provider  atenolol (TENORMIN) 25 MG tablet Take 1 tablet (25 mg total) by mouth daily. You may take an extra tablet as needed for palpitations 08/04/18  Yes Turner, Traci R, MD  lisinopril (ZESTRIL) 10 MG tablet Take 1 tablet (10 mg total) by mouth daily. 01/30/19  Yes Turner, Eber Hong, MD  pantoprazole (PROTONIX) 40 MG tablet Take 1 tablet (40 mg  total) by mouth daily. To reduce stomach acid 03/30/19  Yes Marcine Matar, MD  cetirizine (ZYRTEC) 10 MG tablet Take 1 tablet (10 mg total) by mouth daily. 03/03/19   Dahlia Byes A, NP  naproxen (NAPROSYN) 500 MG tablet Take 1 tablet (500 mg total) by mouth 2 (two) times daily. 04/04/19   Moni Rothrock C, PA-C  tiZANidine (ZANAFLEX) 4 MG tablet Take 1 tablet (4 mg total) by mouth every 8 (eight) hours as needed for muscle spasms. 04/04/19   Sandy Blouch C, PA-C  ferrous sulfate 325 (65 FE) MG tablet Take 1 tablet (325 mg total) by mouth 2 (two) times daily with a meal. Patient not taking: Reported on 03/01/2019 01/04/19 03/03/19  Bing Neighbors, FNP  metoprolol tartrate (LOPRESSOR) 25 MG tablet Take 1 tablet (25 mg total) by  mouth once for 1 dose. Take one tablet (25 mg) 2 hours prior to your CT scan. 01/01/19 03/03/19  Quintella Reichert, MD  PARoxetine (PAXIL) 30 MG tablet Take 1 tablet (30 mg total) by mouth daily. Patient not taking: Reported on 03/01/2019 02/27/19 03/03/19  Pucilowski, Roosvelt Maser, MD  traZODone (DESYREL) 50 MG tablet Take 1 tablet (50 mg total) by mouth at bedtime as needed for sleep. Patient not taking: Reported on 03/01/2019 02/27/19 03/03/19  Pucilowski, Roosvelt Maser, MD    Family History Family History  Problem Relation Age of Onset  . Cancer Father 75       esophagus  . Asthma Father   . Esophageal cancer Father   . Hypertension Mother   . Lupus Mother   . Diabetes Mother   . Cancer Maternal Uncle 40       pancreatic cancer  . Cancer Paternal Grandmother 64       esophageal  . Anxiety disorder Paternal Aunt   . Depression Paternal Aunt   . Anxiety disorder Cousin   . Colon cancer Neg Hx   . Ulcerative colitis Neg Hx     Social History Social History   Tobacco Use  . Smoking status: Never Smoker  . Smokeless tobacco: Never Used  Substance Use Topics  . Alcohol use: Yes    Comment: occasional  . Drug use: Not Currently     Allergies   Diflucan [fluconazole]   Review of Systems Review of Systems  Constitutional: Negative for fatigue and fever.  HENT: Negative for congestion, sinus pressure and sore throat.   Eyes: Negative for photophobia, pain and visual disturbance.  Respiratory: Negative for cough and shortness of breath.   Cardiovascular: Positive for chest pain. Negative for leg swelling.  Gastrointestinal: Positive for nausea. Negative for abdominal pain and vomiting.  Genitourinary: Negative for decreased urine volume and hematuria.  Musculoskeletal: Positive for back pain and myalgias. Negative for neck pain and neck stiffness.  Neurological: Positive for light-headedness and headaches. Negative for dizziness, syncope, facial asymmetry, speech difficulty, weakness  and numbness.     Physical Exam Triage Vital Signs ED Triage Vitals  Enc Vitals Group     BP 04/04/19 1023 118/70     Pulse Rate 04/04/19 1023 60     Resp 04/04/19 1023 16     Temp 04/04/19 1023 98.5 F (36.9 C)     Temp Source 04/04/19 1023 Oral     SpO2 04/04/19 1023 100 %     Weight 04/04/19 1019 224 lb (101.6 kg)     Height --      Head Circumference --  Peak Flow --      Pain Score 04/04/19 1020 6     Pain Loc --      Pain Edu? --      Excl. in GC? --    No data found.  Updated Vital Signs BP 118/70 (BP Location: Right Arm)   Pulse 60   Temp 98.5 F (36.9 C) (Oral)   Resp 16   Wt 224 lb (101.6 kg)   LMP 11/12/2018 (Exact Date)   SpO2 100%   BMI 31.24 kg/m   Visual Acuity Right Eye Distance:   Left Eye Distance:   Bilateral Distance:    Right Eye Near:   Left Eye Near:    Bilateral Near:     Physical Exam Vitals and nursing note reviewed.  Constitutional:      Appearance: She is well-developed.     Comments: No acute distress  HENT:     Head: Normocephalic and atraumatic.     Nose: Nose normal.  Eyes:     Conjunctiva/sclera: Conjunctivae normal.  Cardiovascular:     Rate and Rhythm: Normal rate.     Comments: occasionaly irrgeular beat auscultated Pulmonary:     Effort: Pulmonary effort is normal. No respiratory distress.     Comments: Breathing comfortably at rest, CTABL, no wheezing, rales or other adventitious sounds auscultated Abdominal:     General: There is no distension.     Comments: Nondistended  Musculoskeletal:        General: Normal range of motion.     Cervical back: Neck supple.     Comments: Anterior chest tender to palpation to left sternal border and under left breast and specific pinpoint area more centrally.  Left superior thoracic musculature tender to palpation  Skin:    General: Skin is warm and dry.  Neurological:     Mental Status: She is alert and oriented to person, place, and time.      UC Treatments /  Results  Labs (all labs ordered are listed, but only abnormal results are displayed) Labs Reviewed - No data to display  EKG   Radiology No results found.  Procedures Procedures (including critical care time)  Medications Ordered in UC Medications - No data to display  Initial Impression / Assessment and Plan / UC Course  I have reviewed the triage vital signs and the nursing notes.  Pertinent labs & imaging results that were available during my care of the patient were reviewed by me and considered in my medical decision making (see chart for details).     EKG sinus bradycardia at 50 bpm.  Does have incomplete bundle branch block noted in V1/V2, recent cardiac CT on chart review showing cardiac calcium score of 0 and no signs of CAD.  Pain is reproducible, with recent increase in lifting.  Will treat as MSK cause with anti-inflammatories, does have some tenderness to back as well.  Discussed strict return precautions. Patient verbalized understanding and is agreeable with plan.  Final Clinical Impressions(s) / UC Diagnoses   Final diagnoses:  Atypical chest pain     Discharge Instructions     Ekg reassuring Begin naprosyn twice daily with food for the next 7-10 days May supplement with tizanidine at home/bedtime  Follow up if worsening/changing    ED Prescriptions    Medication Sig Dispense Auth. Provider   naproxen (NAPROSYN) 500 MG tablet Take 1 tablet (500 mg total) by mouth 2 (two) times daily. 30 tablet Parthena Fergeson, [place] C, PA-C  tiZANidine (ZANAFLEX) 4 MG tablet Take 1 tablet (4 mg total) by mouth every 8 (eight) hours as needed for muscle spasms. 30 tablet Iren Whipp, [place] C, PA-C     PDMP not reviewed this encounter.   Lew Dawes, [place] 04/04/19 1133

## 2019-04-04 NOTE — Discharge Instructions (Signed)
Ekg reassuring Begin naprosyn twice daily with food for the next 7-10 days May supplement with tizanidine at home/bedtime  Follow up if worsening/changing

## 2019-04-05 ENCOUNTER — Telehealth: Payer: Self-pay

## 2019-04-05 ENCOUNTER — Ambulatory Visit: Payer: 59 | Attending: Family Medicine | Admitting: Family Medicine

## 2019-04-05 ENCOUNTER — Encounter: Payer: Self-pay | Admitting: Family Medicine

## 2019-04-05 ENCOUNTER — Other Ambulatory Visit: Payer: Self-pay

## 2019-04-05 VITALS — BP 107/72 | HR 64 | Temp 98.7°F | Ht 71.0 in | Wt 223.2 lb

## 2019-04-05 DIAGNOSIS — R200 Anesthesia of skin: Secondary | ICD-10-CM | POA: Diagnosis not present

## 2019-04-05 DIAGNOSIS — R0789 Other chest pain: Secondary | ICD-10-CM

## 2019-04-05 DIAGNOSIS — R202 Paresthesia of skin: Secondary | ICD-10-CM

## 2019-04-05 DIAGNOSIS — Z09 Encounter for follow-up examination after completed treatment for conditions other than malignant neoplasm: Secondary | ICD-10-CM

## 2019-04-05 DIAGNOSIS — R300 Dysuria: Secondary | ICD-10-CM | POA: Diagnosis not present

## 2019-04-05 DIAGNOSIS — R8290 Unspecified abnormal findings in urine: Secondary | ICD-10-CM | POA: Diagnosis not present

## 2019-04-05 DIAGNOSIS — N912 Amenorrhea, unspecified: Secondary | ICD-10-CM

## 2019-04-05 DIAGNOSIS — Z3202 Encounter for pregnancy test, result negative: Secondary | ICD-10-CM | POA: Diagnosis not present

## 2019-04-05 DIAGNOSIS — D509 Iron deficiency anemia, unspecified: Secondary | ICD-10-CM

## 2019-04-05 LAB — POCT URINALYSIS DIP (CLINITEK)
Bilirubin, UA: NEGATIVE
Glucose, UA: NEGATIVE mg/dL
Ketones, POC UA: NEGATIVE mg/dL
Leukocytes, UA: NEGATIVE
Nitrite, UA: NEGATIVE
POC PROTEIN,UA: NEGATIVE
Spec Grav, UA: 1.025
Urobilinogen, UA: 1 U/dL
pH, UA: 8

## 2019-04-05 LAB — POCT URINE PREGNANCY: Preg Test, Ur: NEGATIVE

## 2019-04-05 NOTE — Telephone Encounter (Signed)
Called patient in regards to MyChart message:    Thalia Party, MD 3 hours ago (6:19 AM)   Good morning. For the past two days I have had really bad chest pain under my left breast, across my chest at top on both my right and left side, and in the middle of my breast and my blood pressure rises sometimes in the night and at times it'll go into by back and have a burning sensation with tingling all over and some numbness in my wrist and feet. I went to the urgent care and got an ekg yesterday which came out fine. They sent me home with naproxen and a muscle relaxer. The symptoms are still there.  Patient states that she took Naproxen this morning with some improvement in her pain. She still does have some pain under her left breast and in between her breasts. Her BP this morning prior to taking her medications was 144/93. She states her HR has been in the 90s. She denies any shortness of breath or dizziness. She has not been experiencing any palpitations recently. She is not having any numbness currently but states that she has on and off tingling throughout her whole body.  Patient has an appointment with her PCP today at 1:30 and an appointment with neurology on 03/29. Patient states that she has not taken the tizanidine that was given to her in the ED. I advised her that she is able to take that every 8 hours as needed when she is going to be home or before going to bed. She states that she will take it this evening to see if there is any relief.

## 2019-04-05 NOTE — Telephone Encounter (Signed)
Patient has had an extensive cardiac workup that has been normal.  Her pain is atypical - recommended seeing PCP.  Her tingling and numbness all over is liked related to anxiety and possible hyperventiliation.

## 2019-04-05 NOTE — Progress Notes (Signed)
Acute Office Visit  Subjective:    Patient ID: Diane Lloyd, female    DOB: [birth year], 29 y.o.   MRN: [id number]  CC: Possible UTI  HPI : 29 year old female who presents secondary to the complaint of possible urinary tract infection as she reports that she has had burning with urination x5 days and increased urinary frequency.  She denies any fever or chills.  She continues to have chronic issues with headaches.  She denies dizziness.  She has had nausea but denies any possibility of pregnancy as she states that she is currently on birth control pills.  Patient however states that she has not had her period since November of last year after running out of birth control pills but she recently reordered them through an [employer].  She denies any current abdominal pain.  She states that she knows that she is here for possible urinary tract infection but states that yesterday she went to the emergency department due to chest pain and when she contacted her cardiology office today regarding continued chest pain which occurs in the left mid chest and beneath the left breast/upper abdomen, she was told to mention this to her primary care provider as well as the fact that she has had recurrent numbness and tingling in her hands and feet but can also occur in other parts of the body.  Numbness and tingling occur randomly.  Numbness and tingling have been going on for few months.  She states that cardiology also told her to restart the use of her muscle relaxant to help with her chest pain.  Patient is not taking her acid reflux medication currently but is willing to restart use of the medication.   Past Medical History:  Diagnosis Date  . Anemia    iron def  . Anxiety   . Asthma   . Hypertension   . Obesity, unspecified   . PCOS (polycystic ovarian syndrome)   . Positive ANA (antinuclear antibody) 05/05/2017   done by [facility] in [facility]; ANA 1:160, speckled pattern    Past  Surgical History:  Procedure Laterality Date  . TONSILLECTOMY      Family History  Problem Relation Age of Onset  . Cancer Father 73       esophagus  . Asthma Father   . Esophageal cancer Father   . Hypertension Mother   . Lupus Mother   . Diabetes Mother   . Cancer Maternal Uncle 40       pancreatic cancer  . Cancer Paternal Grandmother 35       esophageal  . Anxiety disorder Paternal Aunt   . Depression Paternal Aunt   . Anxiety disorder Cousin   . Colon cancer Neg Hx   . Ulcerative colitis Neg Hx     Social History   Socioeconomic History  . Marital status: Single    Spouse name: Not on file  . Number of children: 0  . Years of education: Not on file  . Highest education level: Some college, no degree  Occupational History  . Occupation: CSR    Employer: [employer]  Tobacco Use  . Smoking status: Never Smoker  . Smokeless tobacco: Never Used  Substance and Sexual Activity  . Alcohol use: Yes    Comment: occasional  . Drug use: Not Currently  . Sexual activity: Yes    Birth control/protection: None  Other Topics Concern  . Not on file  Social History Narrative   Patient is  right-handed. She lives with her mother and uncle in a one level home. She occasionally drinks caffeine. She does not exercise.   Social Determinants of Health   Financial Resource Strain:   . Difficulty of Paying Living Expenses:   Food Insecurity:   . Worried About Programme researcher, broadcasting/film/video in the Last Year:   . Barista in the Last Year:   Transportation Needs:   . Freight forwarder (Medical):   Marland Kitchen Lack of Transportation (Non-Medical):   Physical Activity:   . Days of Exercise per Week:   . Minutes of Exercise per Session:   Stress:   . Feeling of Stress :   Social Connections:   . Frequency of Communication with Friends and Family:   . Frequency of Social Gatherings with Friends and Family:   . Attends Religious Services:   . Active Member of Clubs or Organizations:   .  Attends Banker Meetings:   Marland Kitchen Marital Status:   Intimate Partner Violence:   . Fear of Current or Ex-Partner:   . Emotionally Abused:   Marland Kitchen Physically Abused:   . Sexually Abused:     Outpatient Medications Prior to Visit  Medication Sig Dispense Refill  . atenolol (TENORMIN) 25 MG tablet Take 1 tablet (25 mg total) by mouth daily. You may take an extra tablet as needed for palpitations 45 tablet 6  . cetirizine (ZYRTEC) 10 MG tablet Take 1 tablet (10 mg total) by mouth daily. 30 tablet 0  . lisinopril (ZESTRIL) 10 MG tablet Take 1 tablet (10 mg total) by mouth daily. 30 tablet 11  . naproxen (NAPROSYN) 500 MG tablet Take 1 tablet (500 mg total) by mouth 2 (two) times daily. 30 tablet 0  . pantoprazole (PROTONIX) 40 MG tablet Take 1 tablet (40 mg total) by mouth daily. To reduce stomach acid 90 tablet 0  . tiZANidine (ZANAFLEX) 4 MG tablet Take 1 tablet (4 mg total) by mouth every 8 (eight) hours as needed for muscle spasms. 30 tablet 0   No facility-administered medications prior to visit.    Allergies  Allergen Reactions  . Diflucan [Fluconazole] Itching and Rash    Review of Systems  Constitutional: Positive for fatigue. Negative for chills and fever.  HENT: Negative for sore throat and trouble swallowing.   Eyes: Negative for photophobia and visual disturbance.  Respiratory: Negative for cough and shortness of breath.   Cardiovascular: Positive for chest pain. Negative for palpitations.  Gastrointestinal: Positive for nausea. Negative for abdominal pain, blood in stool, constipation and diarrhea.  Endocrine: Negative for cold intolerance, heat intolerance, polydipsia, polyphagia and polyuria.  Genitourinary: Positive for dysuria. Negative for frequency.  Musculoskeletal: Negative for arthralgias and back pain.  Neurological: Positive for headaches. Negative for dizziness.  Hematological: Negative for adenopathy. Does not bruise/bleed easily.       Objective:     Physical Exam Constitutional:      General: She is not in acute distress.    Appearance: Normal appearance. She is obese. She is not ill-appearing.  Cardiovascular:     Rate and Rhythm: Normal rate and regular rhythm.  Pulmonary:     Effort: Pulmonary effort is normal.     Breath sounds: Normal breath sounds.  Abdominal:     Palpations: Abdomen is soft.     Tenderness: There is no abdominal tenderness. There is no right CVA tenderness, left CVA tenderness, guarding or rebound.  Musculoskeletal:  General: No tenderness.     Right lower leg: No edema.     Left lower leg: No edema.  Skin:    General: Skin is warm and dry.  Neurological:     General: No focal deficit present.     Mental Status: She is alert and oriented to person, place, and time.  Psychiatric:        Mood and Affect: Mood normal.        Behavior: Behavior normal.     BP 107/72 (BP Location: Left Arm, Patient Position: Sitting, Cuff Size: Large)   Pulse 64   Temp 98.7 F (37.1 C) (Oral)   Ht 5\' 11"  (1.803 m)   Wt 223 lb 3.2 oz (101.2 kg)   SpO2 99%   BMI 31.13 kg/m  Wt Readings from Last 3 Encounters:  04/04/19 224 lb (101.6 kg)  01/17/19 230 lb (104.3 kg)  01/02/19 230 lb (104.3 kg)     Lab Results  Component Value Date   TSH 1.150 12/22/2018   Lab Results  Component Value Date   WBC 6.3 01/02/2019   HGB 10.9 (L) 01/02/2019   HCT 35.7 (L) 01/02/2019   MCV 78.5 (L) 01/02/2019   PLT 335 01/02/2019   Lab Results  Component Value Date   NA 134 (L) 01/02/2019   K 3.7 01/02/2019   CO2 25 01/02/2019   GLUCOSE 102 (H) 01/02/2019   BUN 10 01/02/2019   CREATININE 0.91 01/02/2019   BILITOT 0.3 12/22/2018   ALKPHOS 57 12/22/2018   AST 14 12/22/2018   ALT 22 12/22/2018   PROT 7.6 12/22/2018   ALBUMIN 4.5 12/22/2018   CALCIUM 9.5 01/02/2019   ANIONGAP 9 01/02/2019   Lab Results  Component Value Date   CHOL 135 12/12/2015   Lab Results  Component Value Date   HDL 36 (L) 12/12/2015     Lab Results  Component Value Date   LDLCALC 67 12/12/2015   Lab Results  Component Value Date   TRIG 158 (H) 12/12/2015   Lab Results  Component Value Date   CHOLHDL 3.8 12/12/2015   Lab Results  Component Value Date   HGBA1C 5.4 12/22/2018       Assessment & Plan:  1. Dysuria Patient with complaint of 5 days of burning with urination as well as nausea.  Will obtain urinalysis to look for possible urinary tract infection. - POCT URINALYSIS DIP (CLINITEK)  2. Abnormal urinalysis Patient with presence of red blood cells on urinalysis and urine will be sent for culture and she will be notified if further treatment is needed based on the culture results.  Repeat UA in about 3 weeks to make sure that presence of red blood cells has resolved.  Upon questioning regarding last menses, patient reports that she has not had her menses since November 2020. - Urine Culture  3. Atypical chest pain; 7.  Encounter for examination following treatment at hospital Patient was scheduled for appointment here in the office yesterday but she may have been at urgent care at that time.  Patient's urgent care note from 04/04/2019 reviewed.  Patient was seen for atypical chest pain in the left chest and underneath her left breast as well as in her right side which she stated had been present for 2 days.  Per urgent care notes notes, she has been taking Toradol to help with her headaches.  At today's visit she reports that she is not taking her pantoprazole.  She was asked to  restart her pantoprazole as her chest discomfort especially discomfort beneath the left breast/left upper abdomen as well as mid upper abdominal discomfort and nausea may be related to acid reflux.  Per urgent care notes she was prescribed naproxen twice daily with food for 7 to 10 days for possible musculoskeletal chest pain and was told to take tizanidine at bedtime.  EKG at urgent care was reassuring per urgent care notes.  In addition to  restart her pantoprazole, she has been asked to make follow-up appointment with her cardiologist and/or return to urgent care/ED if continued symptoms or worsening symptoms  4. Numbness and tingling Patient reports that she has had random numbness and tingling for the past 2 months.  She will have TSH to look for thyroid disorder, BMP to look for electrolyte abnormality/elevated blood sugar, vitamin B12 level to look for vitamin B12 deficiency and hemoglobin A1c to look for possible prediabetes/diabetes as a cause of her numbness and tingling. - TSH - Basic Metabolic Panel - Vitamin Z61 - Hemoglobin A1c  5. Absent menses She reports nausea as well as no menses since November 2020.  Urine pregnancy test will be done at today's visit. - POCT urine pregnancy  6. Microcytic anemia On review of labs patient with microcytic anemia with hemoglobin of 10.9 with MCV of 78.5 on 01/02/2019 and patient has been taking oral Toradol for relief of migraines.  Will repeat CBC in follow-up of her anemia as well as iron binding panel and she will be notified if iron therapy is needed based on her results. - CBC - Iron, TIBC and Ferritin Panel    An After Visit Summary was printed and given to the patient.  Return in about 3 weeks (around 04/26/2019) for Abnormal UA; schedule follow-up with cardiology/go to ED if continued chest pain.  Antony Blackbird, MD

## 2019-04-05 NOTE — Progress Notes (Signed)
uti for the past 5 days

## 2019-04-06 LAB — CBC
Hematocrit: 39.4 % (ref 34.0–46.6)
Hemoglobin: 12.2 g/dL (ref 11.1–15.9)
MCH: 25.6 pg — ABNORMAL LOW (ref 26.6–33.0)
MCHC: 31 g/dL — ABNORMAL LOW (ref 31.5–35.7)
MCV: 83 fL (ref 79–97)
Platelets: 319 x10E3/uL (ref 150–450)
RBC: 4.77 x10E6/uL (ref 3.77–5.28)
RDW: 14.4 % (ref 11.7–15.4)
WBC: 4.4 x10E3/uL (ref 3.4–10.8)

## 2019-04-06 LAB — BASIC METABOLIC PANEL WITH GFR
BUN/Creatinine Ratio: 12 (ref 9–23)
BUN: 10 mg/dL (ref 6–20)
CO2: 24 mmol/L (ref 20–29)
Calcium: 10 mg/dL (ref 8.7–10.2)
Chloride: 102 mmol/L (ref 96–106)
Creatinine, Ser: 0.81 mg/dL (ref 0.57–1.00)
GFR calc Af Amer: 115 mL/min/1.73
GFR calc non Af Amer: 100 mL/min/1.73
Glucose: 87 mg/dL (ref 65–99)
Potassium: 3.9 mmol/L (ref 3.5–5.2)
Sodium: 139 mmol/L (ref 134–144)

## 2019-04-06 LAB — IRON,TIBC AND FERRITIN PANEL
Ferritin: 43 ng/mL (ref 15–150)
Iron Saturation: 19 % (ref 15–55)
Iron: 67 ug/dL (ref 27–159)
Total Iron Binding Capacity: 356 ug/dL (ref 250–450)
UIBC: 289 ug/dL (ref 131–425)

## 2019-04-06 LAB — HEMOGLOBIN A1C
Est. average glucose Bld gHb Est-mCnc: 108 mg/dL
Hgb A1c MFr Bld: 5.4 % (ref 4.8–5.6)

## 2019-04-06 LAB — VITAMIN B12: Vitamin B-12: 872 pg/mL (ref 232–1245)

## 2019-04-06 LAB — TSH: TSH: 0.927 u[IU]/mL (ref 0.450–4.500)

## 2019-04-07 LAB — URINE CULTURE

## 2019-04-08 NOTE — Progress Notes (Signed)
Urine culture did not show the growth of a bacteria that can cause bladder infections. Complete blood count within normal, no anemia.  Normal thyroid blood test.  Normal basic metabolic panel/normal electrolytes. Normal vitamin B12 level.  Normal hemoglobin A1c of 5.4 indicating that blood sugars have been normal over the past 90 days.  Normal iron panel

## 2019-04-09 ENCOUNTER — Ambulatory Visit: Payer: 59 | Admitting: Neurology

## 2019-04-09 ENCOUNTER — Encounter: Payer: Self-pay | Admitting: Neurology

## 2019-04-09 ENCOUNTER — Other Ambulatory Visit: Payer: Self-pay

## 2019-04-09 VITALS — BP 112/60 | HR 62 | Temp 97.1°F | Ht 71.0 in | Wt 223.0 lb

## 2019-04-09 DIAGNOSIS — G8929 Other chronic pain: Secondary | ICD-10-CM | POA: Insufficient documentation

## 2019-04-09 DIAGNOSIS — R519 Headache, unspecified: Secondary | ICD-10-CM | POA: Diagnosis not present

## 2019-04-09 MED ORDER — RIZATRIPTAN BENZOATE 10 MG PO TBDP: 10.0000 mg | ORAL_TABLET | ORAL | 11 refills | Status: DC | PRN

## 2019-04-09 MED ORDER — NORTRIPTYLINE HCL 10 MG PO CAPS: ORAL_CAPSULE | ORAL | 6 refills | Status: DC

## 2019-04-09 NOTE — Patient Instructions (Signed)
Nortriptyline at bedtime Rizatriptan: Please take one tablet at the onset of your headache. If it does not improve the symptoms please take one additional tablet. Do not take more then 2 tablets in 24hrs. Do not take use more then 2 to 3 times in a week.  Rizatriptan disintegrating tablets What is this medicine? RIZATRIPTAN (rye za TRIP tan) is used to treat migraines with or without aura. An aura is a strange feeling or visual disturbance that warns you of an attack. It is not used to prevent migraines. This medicine may be used for other purposes; ask your health care provider or pharmacist if you have questions. COMMON BRAND NAME(S): Maxalt-MLT What should I tell my health care provider before I take this medicine? They need to know if you have any of these conditions:  cigarette smoker  circulation problems in fingers and toes  diabetes  heart disease  high blood pressure  high cholesterol  history of irregular heartbeat  history of stroke  kidney disease  liver disease  stomach or intestine problems  an unusual or allergic reaction to rizatriptan, other medicines, foods, dyes, or preservatives  pregnant or trying to get pregnant  breast-feeding How should I use this medicine? Take this medicine by mouth. Follow the directions on the prescription label. Leave the tablet in the sealed blister pack until you are ready to take it. With dry hands, open the blister and gently remove the tablet. If the tablet breaks or crumbles, throw it away and take a new tablet out of the blister pack. Place the tablet in the mouth and allow it to dissolve, and then swallow. Do not cut, crush, or chew this medicine. You do not need water to take this medicine. Do not take it more often than directed. Talk to your pediatrician regarding the use of this medicine in children. While this drug may be prescribed for children as young as 6 years for selected conditions, precautions do  apply. Overdosage: If you think you have taken too much of this medicine contact a poison control center or emergency room at once. NOTE: This medicine is only for you. Do not share this medicine with others. What if I miss a dose? This does not apply. This medicine is not for regular use. What may interact with this medicine? Do not take this medicine with any of the following medicines:  certain medicines for migraine headache like almotriptan, eletriptan, frovatriptan, naratriptan, rizatriptan, sumatriptan, zolmitriptan  ergot alkaloids like dihydroergotamine, ergonovine, ergotamine, methylergonovine  MAOIs like Carbex, Eldepryl, Marplan, Nardil, and Parnate This medicine may also interact with the following medications:  certain medicines for depression, anxiety, or psychotic disorders  propranolol This list may not describe all possible interactions. Give your health care provider a list of all the medicines, herbs, non-prescription drugs, or dietary supplements you use. Also tell them if you smoke, drink alcohol, or use illegal drugs. Some items may interact with your medicine. What should I watch for while using this medicine? Visit your healthcare professional for regular checks on your progress. Tell your healthcare professional if your symptoms do not start to get better or if they get worse. You may get drowsy or dizzy. Do not drive, use machinery, or do anything that needs mental alertness until you know how this medicine affects you. Do not stand up or sit up quickly, especially if you are an older patient. This reduces the risk of dizzy or fainting spells. Alcohol may interfere with the effect of this  medicine. Your mouth may get dry. Chewing sugarless gum or sucking hard candy and drinking plenty of water may help. Contact your healthcare professional if the problem does not go away or is severe. If you take migraine medicines for 10 or more days a month, your migraines may get  worse. Keep a diary of headache days and medicine use. Contact your healthcare professional if your migraine attacks occur more frequently. What side effects may I notice from receiving this medicine? Side effects that you should report to your doctor or health care professional as soon as possible:  allergic reactions like skin rash, itching or hives, swelling of the face, lips, or tongue  chest pain or chest tightness  signs and symptoms of a dangerous change in heartbeat or heart rhythm like chest pain; dizziness; fast, irregular heartbeat; palpitations; feeling faint or lightheaded; falls; breathing problems  signs and symptoms of a stroke like changes in vision; confusion; trouble speaking or understanding; severe headaches; sudden numbness or weakness of the face, arm or leg; trouble walking; dizziness; loss of balance or coordination  signs and symptoms of serotonin syndrome like irritable; confusion; diarrhea; fast or irregular heartbeat; muscle twitching; stiff muscles; trouble walking; sweating; high fever; seizures; chills; vomiting Side effects that usually do not require medical attention (report to your doctor or health care professional if they continue or are bothersome):  diarrhea  dizziness  drowsiness  dry mouth  headache  nausea, vomiting  pain, tingling, numbness in the hands or feet  stomach pain This list may not describe all possible side effects. Call your doctor for medical advice about side effects. You may report side effects to FDA at 1-800-FDA-1088. Where should I keep my medicine? Keep out of the reach of children. Store at room temperature between 15 and 30 degrees C (59 and 86 degrees F). Protect from light and moisture. Throw away any unused medicine after the expiration date. NOTE: This sheet is a summary. It may not cover all possible information. If you have questions about this medicine, talk to your doctor, pharmacist, or health care  provider.  2020 Elsevier/Gold Standard (2017-07-12 14:58:08)   Nortriptyline capsules What is this medicine? NORTRIPTYLINE (nor TRIP ti leen) is used to treat headaches. This medicine may be used for other purposes; ask your health care provider or pharmacist if you have questions. COMMON BRAND NAME(S): Aventyl, Pamelor What should I tell my health care provider before I take this medicine? They need to know if you have any of these conditions:  bipolar disorder  Brugada syndrome  difficulty passing urine  glaucoma  heart disease  if you drink alcohol  liver disease  schizophrenia  seizures  suicidal thoughts, plans or attempt; a previous suicide attempt by you or a family member  thyroid disease  an unusual or allergic reaction to nortriptyline, other tricyclic antidepressants, other medicines, foods, dyes, or preservatives  pregnant or trying to get pregnant  breast-feeding How should I use this medicine? Take this medicine by mouth with a glass of water. Follow the directions on the prescription label. Take your doses at regular intervals. Do not take it more often than directed. Do not stop taking this medicine suddenly except upon the advice of your doctor. Stopping this medicine too quickly may cause serious side effects or your condition may worsen. A special MedGuide will be given to you by the pharmacist with each prescription and refill. Be sure to read this information carefully each time. Talk to your pediatrician regarding  the use of this medicine in children. Special care may be needed. Overdosage: If you think you have taken too much of this medicine contact a poison control center or emergency room at once. NOTE: This medicine is only for you. Do not share this medicine with others. What if I miss a dose? If you miss a dose, take it as soon as you can. If it is almost time for your next dose, take only that dose. Do not take double or extra doses. What  may interact with this medicine? Do not take this medicine with any of the following medications:  cisapride  dronedarone  linezolid  MAOIs like Carbex, Eldepryl, Marplan, Nardil, and Parnate  methylene blue (injected into a vein)  pimozide  thioridazine This medicine may also interact with the following medications:  alcohol  antihistamines for allergy, cough, and cold  atropine  certain medicines for bladder problems like oxybutynin, tolterodine  certain medicines for depression like amitriptyline, fluoxetine, sertraline  certain medicines for Parkinson's disease like benztropine, trihexyphenidyl  certain medicines for stomach problems like dicyclomine, hyoscyamine  certain medicines for travel sickness like scopolamine  chlorpropamide  cimetidine  ipratropium  other medicines that prolong the QT interval (an abnormal heart rhythm) like dofetilide  other medicines that can cause serotonin syndrome like St. John's Wort, fentanyl, lithium, tramadol, tryptophan, buspirone, and some medicines for headaches like sumatriptan or rizatriptan  quinidine  reserpine  thyroid medicine This list may not describe all possible interactions. Give your health care provider a list of all the medicines, herbs, non-prescription drugs, or dietary supplements you use. Also tell them if you smoke, drink alcohol, or use illegal drugs. Some items may interact with your medicine. What should I watch for while using this medicine? Tell your doctor if your symptoms do not get better or if they get worse. Visit your doctor or health care professional for regular checks on your progress. Because it may take several weeks to see the full effects of this medicine, it is important to continue your treatment as prescribed by your doctor. Patients and their families should watch out for new or worsening thoughts of suicide or depression. Also watch out for sudden changes in feelings such as  feeling anxious, agitated, panicky, irritable, hostile, aggressive, impulsive, severely restless, overly excited and hyperactive, or not being able to sleep. If this happens, especially at the beginning of treatment or after a change in dose, call your health care professional. Bonita Quin may get drowsy or dizzy. Do not drive, use machinery, or do anything that needs mental alertness until you know how this medicine affects you. Do not stand or sit up quickly, especially if you are an older patient. This reduces the risk of dizzy or fainting spells. Alcohol may interfere with the effect of this medicine. Avoid alcoholic drinks. Do not treat yourself for coughs, colds, or allergies without asking your doctor or health care professional for advice. Some ingredients can increase possible side effects. Your mouth may get dry. Chewing sugarless gum or sucking hard candy, and drinking plenty of water may help. Contact your doctor if the problem does not go away or is severe. This medicine may cause dry eyes and blurred vision. If you wear contact lenses you may feel some discomfort. Lubricating drops may help. See your eye doctor if the problem does not go away or is severe. This medicine can cause constipation. Try to have a bowel movement at least every 2 to 3 days. If you do  not have a bowel movement for 3 days, call your doctor or health care professional. This medicine can make you more sensitive to the sun. Keep out of the sun. If you cannot avoid being in the sun, wear protective clothing and use sunscreen. Do not use sun lamps or tanning beds/booths. What side effects may I notice from receiving this medicine? Side effects that you should report to your doctor or health care professional as soon as possible:  allergic reactions like skin rash, itching or hives, swelling of the face, lips, or tongue  anxious  breathing problems  changes in vision  confusion  elevated mood, decreased need for sleep,  racing thoughts, impulsive behavior  eye pain  fast, irregular heartbeat  feeling faint or lightheaded, falls  feeling agitated, angry, or irritable  fever with increased sweating  hallucination, loss of contact with reality  seizures  stiff muscles  suicidal thoughts or other mood changes  tingling, pain, or numbness in the feet or hands  trouble passing urine or change in the amount of urine  trouble sleeping  unusually weak or tired  vomiting  yellowing of the eyes or skin Side effects that usually do not require medical attention (report to your doctor or health care professional if they continue or are bothersome):  change in sex drive or performance  change in appetite or weight  constipation  dizziness  dry mouth  nausea  tired  tremors  upset stomach This list may not describe all possible side effects. Call your doctor for medical advice about side effects. You may report side effects to FDA at 1-800-FDA-1088. Where should I keep my medicine? Keep out of the reach of children. Store at room temperature between 15 and 30 degrees C (59 and 86 degrees F). Keep container tightly closed. Throw away any unused medicine after the expiration date. NOTE: This sheet is a summary. It may not cover all possible information. If you have questions about this medicine, talk to your doctor, pharmacist, or health care provider.  2020 Elsevier/Gold Standard (2017-12-20 13:24:58)  Migraine Headache A migraine headache is a very strong throbbing pain on one side or both sides of your head. This type of headache can also cause other symptoms. It can last from 4 hours to 3 days. Talk with your doctor about what things may bring on (trigger) this condition. What are the causes? The exact cause of this condition is not known. This condition may be triggered or caused by:  Drinking alcohol.  Smoking.  Taking medicines, such as: ? Medicine used to treat chest pain  (nitroglycerin). ? Birth control pills. ? Estrogen. ? Some blood pressure medicines.  Eating or drinking certain products.  Doing physical activity. Other things that may trigger a migraine headache include:  Having a menstrual period.  Pregnancy.  Hunger.  Stress.  Not getting enough sleep or getting too much sleep.  Weather changes.  Tiredness (fatigue). What increases the risk?  Being 8-74 years old.  Being female.  Having a family history of migraine headaches.  Being Caucasian.  Having depression or anxiety.  Being very overweight. What are the signs or symptoms?  A throbbing pain. This pain may: ? Happen in any area of the head, such as on one side or both sides. ? Make it hard to do daily activities. ? Get worse with physical activity. ? Get worse around bright lights or loud noises.  Other symptoms may include: ? Feeling sick to your stomach (nauseous). ?  Vomiting. ? Dizziness. ? Being sensitive to bright lights, loud noises, or smells.  Before you get a migraine headache, you may get warning signs (an aura). An aura may include: ? Seeing flashing lights or having blind spots. ? Seeing bright spots, halos, or zigzag lines. ? Having tunnel vision or blurred vision. ? Having numbness or a tingling feeling. ? Having trouble talking. ? Having weak muscles.  Some people have symptoms after a migraine headache (postdromal phase), such as: ? Tiredness. ? Trouble thinking (concentrating). How is this treated?  Taking medicines that: ? Relieve pain. ? Relieve the feeling of being sick to your stomach. ? Prevent migraine headaches.  Treatment may also include: ? Having acupuncture. ? Avoiding foods that bring on migraine headaches. ? Learning ways to control your body functions (biofeedback). ? Therapy to help you know and deal with negative thoughts (cognitive behavioral therapy). Follow these instructions at home: Medicines  Take  over-the-counter and prescription medicines only as told by your doctor.  Ask your doctor if the medicine prescribed to you: ? Requires you to avoid driving or using heavy machinery. ? Can cause trouble pooping (constipation). You may need to take these steps to prevent or treat trouble pooping:  Drink enough fluid to keep your pee (urine) pale yellow.  Take over-the-counter or prescription medicines.  Eat foods that are high in fiber. These include beans, whole grains, and fresh fruits and vegetables.  Limit foods that are high in fat and sugar. These include fried or sweet foods. Lifestyle  Do not drink alcohol.  Do not use any products that contain nicotine or tobacco, such as cigarettes, e-cigarettes, and chewing tobacco. If you need help quitting, ask your doctor.  Get at least 8 hours of sleep every night.  Limit and deal with stress. General instructions      Keep a journal to find out what may bring on your migraine headaches. For example, write down: ? What you eat and drink. ? How much sleep you get. ? Any change in what you eat or drink. ? Any change in your medicines.  If you have a migraine headache: ? Avoid things that make your symptoms worse, such as bright lights. ? It may help to lie down in a dark, quiet room. ? Do not drive or use heavy machinery. ? Ask your doctor what activities are safe for you.  Keep all follow-up visits as told by your doctor. This is important. Contact a doctor if:  You get a migraine headache that is different or worse than others you have had.  You have more than 15 headache days in one month. Get help right away if:  Your migraine headache gets very bad.  Your migraine headache lasts longer than 72 hours.  You have a fever.  You have a stiff neck.  You have trouble seeing.  Your muscles feel weak or like you cannot control them.  You start to lose your balance a lot.  You start to have trouble walking.  You  pass out (faint).  You have a seizure. Summary  A migraine headache is a very strong throbbing pain on one side or both sides of your head. These headaches can also cause other symptoms.  This condition may be treated with medicines and changes to your lifestyle.  Keep a journal to find out what may bring on your migraine headaches.  Contact a doctor if you get a migraine headache that is different or worse than others you  have had.  Contact your doctor if you have more than 15 headache days in a month. This information is not intended to replace advice given to you by your health care provider. Make sure you discuss any questions you have with your health care provider. Document Revised: 04/21/2018 Document Reviewed: 02/09/2018 Elsevier Patient Education  2020 [employer].

## 2019-04-09 NOTE — Progress Notes (Signed)
[facility]    Provider:  Dr Lucia Gaskins Requesting Provider: Anders Simmonds, PA-C Primary Care Provider:  Cain Saupe, MD  CC:  headaches  HPI:  Diane Lloyd is a 29 y.o. female here as requested by Anders Simmonds, PA-C for headaches. PMHx PCOS, astma, GERD. I reviewed Dr. Vassie Moment notes, she presented in January with some mild headache and dizziness with sinus pressure in the frontal area of the head, in the setting of a temperature, and lymph node swelling for 1 week.  Several days after this appointment she went to the emergency room for headache, top of the head worse on the right side, extending into the face, Tylenol did not help, has had episodes in the past similar starting in 2019, she reportedly saw neurology in the past with head CT and MRI which were negative, she denied dizziness, stated blurred vision to the right eye, no nausea or vomiting, no light sensitivity.  There were no red flags on exam, no focal findings on examination, Toradol was given.  I also reviewed neurology's notes, she was seen in February 2020, onset of headache December 2019, in the back of the head with associated neck pain, no aura, some associated dizziness but denied associated nausea, vomiting, photophobia, phonophobia, visual disturbance or unilateral numbness or weakness, can last about 10 to 15 minutes about 2 times in a 24-hour.,  Twice a week, Tylenol did not work, sometimes with associated episodes of paresthesia.  She was diagnosed with episodic tension type headaches not requiring pharmacologic intervention, Tylenol extra strength effective, episodic paresthesias of unclear etiology possibly related to anxiety.  She is here alone. She has been having headaches in different spots, they are on the top of her head, sharp pains, to burning, to dull aches, radiates to her face, can be pulsating and throbbing, can be on the left side too. Some sound sensitivity and some nausea. They  can last 5-10 minutes or at most 30-45 minutes. She tries to wait it out, toradol helps, she gets them every day, unknown triggers except under a lot of stress. She can have them 3-4x a day, mostly mid day, she is a CNA, ongoing since 2019, her mother has migraines. No plans for pregnancyin the next year. She doesn't have a problem going to sleep but staying asleep. She does not wake up headaches or dry mouth. She has tingling in the hands, may be CTS.   B12 872, hgba1c 5.4, tsh normal  Meds tried: Tizanidine, metoprolol, trazodone, paxil, tylenol, ibuprofen, atenolol, naproxen, norvasc, toradol, reglan(at the ED), toradol IM, phernergan, zofran, robaxin, meclizine   Reviewed notes, labs and imaging from outside physicians, which showed:  Personally reviewed images CT of the head July 2020 and MRI of the brain April 2019 which were both normal.  Review of Systems: Patient complains of symptoms per HPI as well as the following symptoms: headache. Pertinent negatives and positives per HPI. All others negative.   Social History   Socioeconomic History  . Marital status: Single    Spouse name: Not on file  . Number of children: 0  . Years of education: Not on file  . Highest education level: Some college, no degree  Occupational History  . Occupation: CSR    Employer: [employer]  Tobacco Use  . Smoking status: Never Smoker  . Smokeless tobacco: Never Used  Substance and Sexual Activity  . Alcohol use: Yes    Comment: occasional  . Drug use: Not Currently  .  Sexual activity: Yes    Birth control/protection: None  Other Topics Concern  . Not on file  Social History Narrative   Patient is right-handed. She lives alone. She occasionally drinks caffeine. She does not exercise.   Social Determinants of Health   Financial Resource Strain:   . Difficulty of Paying Living Expenses:   Food Insecurity:   . Worried About Programme researcher, broadcasting/film/video in the Last Year:   . Barista in the Last  Year:   Transportation Needs:   . Freight forwarder (Medical):   Marland Kitchen Lack of Transportation (Non-Medical):   Physical Activity:   . Days of Exercise per Week:   . Minutes of Exercise per Session:   Stress:   . Feeling of Stress :   Social Connections:   . Frequency of Communication with Friends and Family:   . Frequency of Social Gatherings with Friends and Family:   . Attends Religious Services:   . Active Member of Clubs or Organizations:   . Attends Banker Meetings:   Marland Kitchen Marital Status:   Intimate Partner Violence:   . Fear of Current or Ex-Partner:   . Emotionally Abused:   Marland Kitchen Physically Abused:   . Sexually Abused:     Family History  Problem Relation Age of Onset  . Cancer Father 56       esophagus  . Asthma Father   . Esophageal cancer Father   . Hypertension Mother   . Lupus Mother   . Diabetes Mother   . Migraines Mother   . Cancer Maternal Uncle 40       pancreatic cancer  . Cancer Paternal Grandmother 74       esophageal  . Anxiety disorder Paternal Aunt   . Depression Paternal Aunt   . Anxiety disorder Cousin   . Colon cancer Neg Hx   . Ulcerative colitis Neg Hx     Past Medical History:  Diagnosis Date  . Anemia    iron def  . Anxiety   . Asthma   . Hypertension   . Obesity, unspecified   . PCOS (polycystic ovarian syndrome)   . Positive ANA (antinuclear antibody) 05/05/2017   done by [facility] in [facility]; ANA 1:160, speckled pattern    Patient Active Problem List   Diagnosis Date Noted  . Generalized anxiety disorder with panic attacks 06/20/2018  . Vaginal candidiasis 12/18/2015  . Diarrhea 11/10/2015  . Gastroenteritis 09/24/2015  . Screen for STD (sexually transmitted disease) 09/21/2015  . Rash and nonspecific skin eruption 08/31/2015  . Dysmenorrhea 08/31/2015  . Allergic rhinitis 05/17/2013  . GERD (gastroesophageal reflux disease) 03/20/2012  . Polycystic ovarian syndrome 12/26/2011  . Abdominal pain  12/26/2011  . Chest pain 11/01/2011  . Family history of cardiac disorder 02/17/2011  . Contraception management 12/30/2010  . 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 Essential hypertension 01/27/2010  . ANEMIA, IRON DEFICIENCY 06/27/2009  . Obesity 03/10/2006  . ASTHMA, INTERMITTENT 03/10/2006  . ECZEMA, ATOPIC DERMATITIS 03/10/2006    Past Surgical History:  Procedure Laterality Date  . TONSILLECTOMY      Current Outpatient Medications  Medication Sig Dispense Refill  . atenolol (TENORMIN) 25 MG tablet Take 1 tablet (25 mg total) by mouth daily. You may take an extra tablet as needed for palpitations 45 tablet 6  . lisinopril-hydrochlorothiazide (ZESTORETIC) 20-25 MG tablet Take 1 tablet by mouth daily.    . naproxen (NAPROSYN) 500 MG tablet  Take 1 tablet (500 mg total) by mouth 2 (two) times daily. 30 tablet 0  . pantoprazole (PROTONIX) 40 MG tablet Take 1 tablet (40 mg total) by mouth daily. To reduce stomach acid 90 tablet 0  . nortriptyline (PAMELOR) 10 MG capsule Start with one capsule(10mg ) an hour before bedtime. May increase by one pill weekly as needed until taking 3 capsules(30mg ) an hour before bedtime 90 capsule 6  . rizatriptan (MAXALT-MLT) 10 MG disintegrating tablet Take 1 tablet (10 mg total) by mouth as needed for migraine. May repeat in 2 hours if needed 9 tablet 11   No current facility-administered medications for this visit.    Allergies as of 04/09/2019 - Review Complete 04/09/2019  Allergen Reaction Noted  . Diflucan [fluconazole] Itching and Rash 02/05/2016    Vitals: BP 112/60 (BP Location: Right Arm, Patient Position: Sitting)   Pulse 62   Temp (!) 97.1 F (36.2 C) Comment: taken at front  Ht 5\' 11"  (1.803 m)   Wt 223 lb (101.2 kg)   BMI 31.10 kg/m  Last Weight:  Wt Readings from Last 1 Encounters:  04/09/19 223 lb (101.2 kg)   Last Height:   Ht Readings from Last 1 Encounters:  04/09/19 5\' 11"  (1.803 m)     Physical  exam: Exam: Gen: NAD, conversant, well nourised, obese, well groomed                     CV: RRR, no MRG. No Carotid Bruits. No peripheral edema, warm, nontender Eyes: Conjunctivae clear without exudates or hemorrhage  Neuro: Detailed Neurologic Exam  Speech:    Speech is normal; fluent and spontaneous with normal comprehension.  Cognition:    The patient is oriented to person, place, and time;     recent and remote memory intact;     language fluent;     normal attention, concentration,     fund of knowledge Cranial Nerves:    The pupils are equal, round, and reactive to light. The fundi are normal and spontaneous venous pulsations are present. Visual fields are full to finger confrontation. Extraocular movements are intact. Trigeminal sensation is intact and the muscles of mastication are normal. The face is symmetric. The palate elevates in the midline. Hearing intact. Voice is normal. Shoulder shrug is normal. The tongue has normal motion without fasciculations.   Coordination:    No dysmetria  Gait:    Normal native gait.   Motor Observation:    No asymmetry, no atrophy, and no involuntary movements noted. Tone:    Normal muscle tone.    Posture:    Posture is normal. normal erect    Strength:    Strength is V/V in the upper and lower limbs.      Sensation: intact to LT     Reflex Exam:  DTR's:    Deep tendon reflexes in the upper and lower extremities are normal bilaterally.   Toes:    The toes are downgoing bilaterally.   Clonus:    Clonus is absent.    Assessment/Plan:  29 year old with tension-type headache with some migrainous features, mother with migraines, may develop into a migraine disorder. Labs and brain imaging all normal.  Start Nortriptyline for headache prevention, may also help with sleep. Next can try Topiramate.  Can try Maxalt acutely see if it helps  Discussed: To prevent or relieve headaches, try the following: Cool Compress. Lie down  and place a cool compress on your head.  Avoid headache triggers. If certain foods or odors seem to have triggered your migraines in the past, avoid them. A headache diary might help you identify triggers.  Include physical activity in your daily routine. Try a daily walk or other moderate aerobic exercise.  Manage stress. Find healthy ways to cope with the stressors, such as delegating tasks on your to-do list.  Practice relaxation techniques. Try deep breathing, yoga, massage and visualization.  Eat regularly. Eating regularly scheduled meals and maintaining a healthy diet might help prevent headaches. Also, drink plenty of fluids.  Follow a regular sleep schedule. Sleep deprivation might contribute to headaches Consider biofeedback. With this mind-body technique, you learn to control certain bodily functions -- such as muscle tension, heart rate and blood pressure -- to prevent headaches or reduce headache pain.    Proceed to emergency room if you experience new or worsening symptoms or symptoms do not resolve, if you have new neurologic symptoms or if headache is severe, or for any concerning symptom.   Provided education and documentation from American headache Society toolbox including articles on: chronic migraine medication overuse headache, chronic migraines, prevention of migraines, behavioral and other nonpharmacologic treatments for headache.   Meds ordered this encounter  Medications  . rizatriptan (MAXALT-MLT) 10 MG disintegrating tablet    Sig: Take 1 tablet (10 mg total) by mouth as needed for migraine. May repeat in 2 hours if needed    Dispense:  9 tablet    Refill:  11  . nortriptyline (PAMELOR) 10 MG capsule    Sig: Start with one capsule(10mg ) an hour before bedtime. May increase by one pill weekly as needed until taking 3 capsules(30mg ) an hour before bedtime    Dispense:  90 capsule    Refill:  6    Cc: Mathis Dad,  Antony Blackbird, MD  Sarina Ill,  MD  [facility] [address] [place], [place] [address]  Phone [phone] Fax [phone]

## 2019-04-11 ENCOUNTER — Other Ambulatory Visit: Payer: Self-pay

## 2019-04-11 ENCOUNTER — Ambulatory Visit (INDEPENDENT_AMBULATORY_CARE_PROVIDER_SITE_OTHER): Payer: Medicaid Other | Admitting: Psychiatry

## 2019-04-11 ENCOUNTER — Other Ambulatory Visit: Payer: Self-pay | Admitting: Otolaryngology

## 2019-04-11 DIAGNOSIS — F410 Panic disorder [episodic paroxysmal anxiety] without agoraphobia: Secondary | ICD-10-CM

## 2019-04-11 DIAGNOSIS — R1313 Dysphagia, pharyngeal phase: Secondary | ICD-10-CM

## 2019-04-11 DIAGNOSIS — F411 Generalized anxiety disorder: Secondary | ICD-10-CM

## 2019-04-11 MED ORDER — PAROXETINE HCL 20 MG PO TABS: ORAL_TABLET | ORAL | 0 refills | Status: DC

## 2019-04-11 NOTE — Progress Notes (Signed)
[facility] MD/PA/NP OP Progress Note  04/11/2019 2:42 PM Diane Lloyd  MRN:  [id number] Interview was conducted by phone and I verified that I was speaking with the correct person using two identifiers. I discussed the limitations of evaluation and management by telemedicine and  the availability of in person appointments. Patient expressed understanding and agreed to proceed.  Chief Complaint: Anxiety.  HPI: 29 yo single AAF withhx ofgeneralized anxiety and infrequent panic attacks. No clear sx of depression. Middle insomnia wasalso present butresolved after anxiety was successfully treated with paroxetine 20 mg and sleep with trazodone.She has no hx of major depression, mania, psychosis or alcohol/drug addiction. She was briefly in counseling and has never tried psychotropicmedications.She has stopped taking paroxetine in November but by mid December anxiety returned: she started to worry about her health (has a nodule on her neck since December and worries it may be cancerous growth) and having episodes of SOB, chest tightness. She want to ED - cardiac workup was negative. We have restarted paroxetine in late December - she has been on it now for about two months but remained  So I increased dose to 30 mg. She missed follow up appointment two weeks ago and admitted now that she was afraid that dose increase will make her more anxious so she did not pick it up and run out of 20 mg tabs. Middle insomnia subsided and she no longer is needing trazodone.   Visit Diagnosis:    ICD-10-CM   1. Generalized anxiety disorder with panic attacks  F41.1    F41.0     Past Psychiatric History: Please see intake H&P.  Past Medical History:  Past Medical History:  Diagnosis Date  . Anemia    iron def  . Anxiety   . Asthma   . Hypertension   . Obesity, unspecified   . PCOS (polycystic ovarian syndrome)   . Positive ANA (antinuclear antibody) 05/05/2017   done by [facility] in [facility]; ANA  1:160, speckled pattern    Past Surgical History:  Procedure Laterality Date  . TONSILLECTOMY      Family Psychiatric History: Reviewed.  Family History:  Family History  Problem Relation Age of Onset  . Cancer Father 28       esophagus  . Asthma Father   . Esophageal cancer Father   . Hypertension Mother   . Lupus Mother   . Diabetes Mother   . Migraines Mother   . Cancer Maternal Uncle 40       pancreatic cancer  . Cancer Paternal Grandmother 32       esophageal  . Anxiety disorder Paternal Aunt   . Depression Paternal Aunt   . Anxiety disorder Cousin   . Colon cancer Neg Hx   . Ulcerative colitis Neg Hx     Social History:  Social History   Socioeconomic History  . Marital status: Single    Spouse name: Not on file  . Number of children: 0  . Years of education: Not on file  . Highest education level: Some college, no degree  Occupational History  . Occupation: CSR    Employer: [employer]  Tobacco Use  . Smoking status: Never Smoker  . Smokeless tobacco: Never Used  Substance and Sexual Activity  . Alcohol use: Yes    Comment: occasional  . Drug use: Not Currently  . Sexual activity: Yes    Birth control/protection: None  Other Topics Concern  . Not on file  Social History  Narrative   Patient is right-handed. She lives alone. She occasionally drinks caffeine. She does not exercise.   Social Determinants of Health   Financial Resource Strain:   . Difficulty of Paying Living Expenses:   Food Insecurity:   . Worried About Programme researcher, broadcasting/film/video in the Last Year:   . Barista in the Last Year:   Transportation Needs:   . Freight forwarder (Medical):   Marland Kitchen Lack of Transportation (Non-Medical):   Physical Activity:   . Days of Exercise per Week:   . Minutes of Exercise per Session:   Stress:   . Feeling of Stress :   Social Connections:   . Frequency of Communication with Friends and Family:   . Frequency of Social Gatherings with Friends  and Family:   . Attends Religious Services:   . Active Member of Clubs or Organizations:   . Attends Banker Meetings:   Marland Kitchen Marital Status:     Allergies:  Allergies  Allergen Reactions  . Diflucan [Fluconazole] Itching and Rash    Metabolic Disorder Labs: Lab Results  Component Value Date   HGBA1C 5.4 04/05/2019   Lab Results  Component Value Date   PROLACTIN 10.7 10/05/2011   Lab Results  Component Value Date   CHOL 135 12/12/2015   TRIG 158 (H) 12/12/2015   HDL 36 (L) 12/12/2015   CHOLHDL 3.8 12/12/2015   VLDL 32 (H) 12/12/2015   LDLCALC 67 12/12/2015   LDLCALC 67 03/17/2011   Lab Results  Component Value Date   TSH 0.927 04/05/2019   TSH 1.150 12/22/2018    Therapeutic Level Labs: No results found for: LITHIUM No results found for: VALPROATE No components found for:  CBMZ  Current Medications: Current Outpatient Medications  Medication Sig Dispense Refill  . atenolol (TENORMIN) 25 MG tablet Take 1 tablet (25 mg total) by mouth daily. You may take an extra tablet as needed for palpitations 45 tablet 6  . lisinopril-hydrochlorothiazide (ZESTORETIC) 20-25 MG tablet Take 1 tablet by mouth daily.    . naproxen (NAPROSYN) 500 MG tablet Take 1 tablet (500 mg total) by mouth 2 (two) times daily. 30 tablet 0  . nortriptyline (PAMELOR) 10 MG capsule Start with one capsule(10mg ) an hour before bedtime. May increase by one pill weekly as needed until taking 3 capsules(30mg ) an hour before bedtime 90 capsule 6  . pantoprazole (PROTONIX) 40 MG tablet Take 1 tablet (40 mg total) by mouth daily. To reduce stomach acid 90 tablet 0  . PARoxetine (PAXIL) 20 MG tablet Take 1 tablet (20 mg total) by mouth daily for 14 days, THEN 1.5 tablets (30 mg total) daily. 104 tablet 0  . rizatriptan (MAXALT-MLT) 10 MG disintegrating tablet Take 1 tablet (10 mg total) by mouth as needed for migraine. May repeat in 2 hours if needed 9 tablet 11   No current facility-administered  medications for this visit.     Psychiatric Specialty Exam: Review of Systems  Psychiatric/Behavioral: The patient is nervous/anxious.   All other systems reviewed and are negative.   There were no vitals taken for this visit.There is no height or weight on file to calculate BMI.  General Appearance: NA  Eye Contact:  NA  Speech:  Clear and Coherent and Normal Rate  Volume:  Normal  Mood:  Anxious  Affect:  NA  Thought Process:  Goal Directed and Linear  Orientation:  Full (Time, Place, and Person)  Thought Content: Rumination   Suicidal  Thoughts:  No  Homicidal Thoughts:  No  Memory:  Immediate;   Good Recent;   Good Remote;   Good  Judgement:  Fair  Insight:  Fair  Psychomotor Activity:  NA  Concentration:  Concentration: Good  Recall:  Good  Fund of Knowledge: Good  Language: Good  Akathisia:  Negative  Handed:  Right  AIMS (if indicated): not done  Assets:  Communication Skills Desire for Improvement Housing Resilience  ADL's:  Intact  Cognition: WNL  Sleep:  Good   Screenings: GAD-7     Office Visit from 03/01/2019 in [facility] Office Visit from 12/22/2018 in [facility] Office Visit from 06/20/2018 in [facility] Office Visit from 06/15/2018 in [facility] Office Visit from 06/06/2018 in [facility]  Total GAD-7 Score  21  0  5  15  5     PHQ2-9     Office Visit from 03/01/2019 in [facility] Office Visit from 12/22/2018 in [facility] Office Visit from 06/20/2018 in [facility] Office Visit from 06/15/2018 in [facility] Office Visit from 06/06/2018 in [facility]  PHQ-2 Total Score  4  0  0  2  2  PHQ-9 Total Score  9  0  1  3  2        Assessment and Plan: 29 yo single  AAF withhx ofgeneralized anxiety and infrequent panic attacks. No clear sx of depression. Middle insomnia wasalso present butresolved after anxiety was successfully treated with paroxetine 20 mg and sleep with trazodone.She has no hx of major depression, mania, psychosis or alcohol/drug addiction. She was briefly in counseling and has never tried psychotropicmedications.She has stopped taking paroxetine in November but by mid December anxiety returned: she started to worry about her health (has a nodule on her neck since December and worries it may be cancerous growth) and having episodes of SOB, chest tightness. She want to ED - cardiac workup was negative. We have restarted paroxetine in late December - she has been on it now for about two months but remained  So I increased dose to 30 mg. She missed follow up appointment two weeks ago and admitted now that she was afraid that dose increase will make her more anxious so she did not pick it up and run out of 20 mg tabs. Middle insomnia subsided and she no longer is needing trazodone.   Dx: GAD/Panic disorder  Plan: Restart paroxetine 20 mg x 2 weeks then increase to 30 mg. Next appointment in5 weeks. The plan was discussed with patient who had an opportunity to ask questions and these were all answered. I spend20 minutes inphone consultation with the patient.    January, MD 04/11/2019, 2:42 PM

## 2019-04-16 ENCOUNTER — Other Ambulatory Visit: Payer: Self-pay | Admitting: Neurology

## 2019-04-16 DIAGNOSIS — G43709 Chronic migraine without aura, not intractable, without status migrainosus: Secondary | ICD-10-CM

## 2019-04-16 MED ORDER — AJOVY 225 MG/1.5ML ~~LOC~~ SOAJ: 225.0000 mg | SUBCUTANEOUS | 11 refills | Status: DC

## 2019-04-20 ENCOUNTER — Ambulatory Visit
Admission: RE | Admit: 2019-04-20 | Discharge: 2019-04-20 | Disposition: A | Discharge status: Home / Self Care | Payer: Self-pay | Source: Ambulatory Visit | Attending: Otolaryngology | Admitting: Otolaryngology

## 2019-04-20 ENCOUNTER — Encounter (HOSPITAL_BASED_OUTPATIENT_CLINIC_OR_DEPARTMENT_OTHER): Payer: Self-pay | Admitting: Emergency Medicine

## 2019-04-20 ENCOUNTER — Other Ambulatory Visit: Payer: Self-pay

## 2019-04-20 ENCOUNTER — Emergency Department (HOSPITAL_BASED_OUTPATIENT_CLINIC_OR_DEPARTMENT_OTHER)
Admission: EM | Admit: 2019-04-20 | Discharge: 2019-04-20 | Disposition: A | Discharge status: Home / Self Care | Payer: Medicaid Other | Attending: Emergency Medicine | Admitting: Emergency Medicine

## 2019-04-20 DIAGNOSIS — G8929 Other chronic pain: Secondary | ICD-10-CM

## 2019-04-20 DIAGNOSIS — J45909 Unspecified asthma, uncomplicated: Secondary | ICD-10-CM | POA: Insufficient documentation

## 2019-04-20 DIAGNOSIS — R202 Paresthesia of skin: Secondary | ICD-10-CM

## 2019-04-20 DIAGNOSIS — R1313 Dysphagia, pharyngeal phase: Secondary | ICD-10-CM

## 2019-04-20 DIAGNOSIS — R519 Headache, unspecified: Secondary | ICD-10-CM

## 2019-04-20 DIAGNOSIS — I10 Essential (primary) hypertension: Secondary | ICD-10-CM | POA: Insufficient documentation

## 2019-04-20 DIAGNOSIS — Z79899 Other long term (current) drug therapy: Secondary | ICD-10-CM | POA: Insufficient documentation

## 2019-04-20 LAB — URINALYSIS, ROUTINE W REFLEX MICROSCOPIC
Bilirubin Urine: NEGATIVE
Glucose, UA: NEGATIVE mg/dL
Ketones, ur: NEGATIVE mg/dL
Leukocytes,Ua: NEGATIVE
Nitrite: NEGATIVE
Protein, ur: NEGATIVE mg/dL
Specific Gravity, Urine: 1.025 (ref 1.005–1.030)
pH: 6 (ref 5.0–8.0)

## 2019-04-20 LAB — URINALYSIS, MICROSCOPIC (REFLEX)

## 2019-04-20 MED ORDER — KETOROLAC TROMETHAMINE 60 MG/2ML IM SOLN
30.0000 mg | Freq: Once | INTRAMUSCULAR | Status: AC
  Administered 2019-04-20: 05:00:00 30 mg via INTRAMUSCULAR
  Filled 2019-04-20: qty 2

## 2019-04-20 MED ORDER — LORAZEPAM 1 MG PO TABS
0.5000 mg | ORAL_TABLET | Freq: Once | ORAL | Status: AC
  Administered 2019-04-20: 0.5 mg via ORAL
  Filled 2019-04-20: qty 1

## 2019-04-20 NOTE — ED Notes (Signed)
ED Provider at bedside. 

## 2019-04-20 NOTE — ED Triage Notes (Signed)
Pt states she has been having headaches about every day for the past week with numbness mainly in her left hand and arm  Pt states she was seen by her neurologist some time last week  Pt reports nausea without vomiting  Pt has hx of migraine headaches

## 2019-04-20 NOTE — ED Provider Notes (Signed)
[facility] EMERGENCY DEPARTMENT Provider Note   CSN: [id number] Arrival date & time: 04/20/19  0401     History Chief Complaint  Patient presents with  . Numbness    Diane Lloyd is a 29 y.o. female.   Headache Pain location:  Generalized Quality:  Dull Radiates to:  Does not radiate Onset quality:  Gradual Duration: years. Timing:  Constant Progression:  Worsening Chronicity:  Chronic Similar to prior headaches: yes   Context: not activity, not eating, not stress and not exposure to cold air   Relieved by:  None tried Worsened by:  Nothing Ineffective treatments:  None tried Associated symptoms: paresthesias   Associated symptoms: no abdominal pain, no back pain, no eye pain, no facial pain, no fever, no vomiting and no weakness        Past Medical History:  Diagnosis Date  . Anemia    iron def  . Anxiety   . Asthma   . Hypertension   . Obesity, unspecified   . PCOS (polycystic ovarian syndrome)   . Positive ANA (antinuclear antibody) 05/05/2017   done by [facility] in [facility]; ANA 1:160, speckled pattern    Patient Active Problem List   Diagnosis Date Noted  . Chronic nonintractable headache 04/09/2019  . Generalized anxiety disorder with panic attacks 06/20/2018  . Vaginal candidiasis 12/18/2015  . Diarrhea 11/10/2015  . Gastroenteritis 09/24/2015  . Screen for STD (sexually transmitted disease) 09/21/2015  . Rash and nonspecific skin eruption 08/31/2015  . Dysmenorrhea 08/31/2015  . Allergic rhinitis 05/17/2013  . GERD (gastroesophageal reflux disease) 03/20/2012  . Polycystic ovarian syndrome 12/26/2011  . Abdominal pain 12/26/2011  . Chest pain 11/01/2011  . Family history of cardiac disorder 02/17/2011  . Contraception management 12/30/2010  . 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 Essential hypertension 01/27/2010  . ANEMIA, IRON DEFICIENCY 06/27/2009  . Obesity 03/10/2006  . ASTHMA,  INTERMITTENT 03/10/2006  . ECZEMA, ATOPIC DERMATITIS 03/10/2006    Past Surgical History:  Procedure Laterality Date  . TONSILLECTOMY       OB History    Gravida  0   Para      Term      Preterm      AB      Living        SAB      TAB      Ectopic      Multiple      Live Births              Family History  Problem Relation Age of Onset  . Cancer Father 66       esophagus  . Asthma Father   . Esophageal cancer Father   . Hypertension Mother   . Lupus Mother   . Diabetes Mother   . Migraines Mother   . Cancer Maternal Uncle 40       pancreatic cancer  . Cancer Paternal Grandmother 40       esophageal  . Anxiety disorder Paternal Aunt   . Depression Paternal Aunt   . Anxiety disorder Cousin   . Colon cancer Neg Hx   . Ulcerative colitis Neg Hx     Social History   Tobacco Use  . Smoking status: Never Smoker  . Smokeless tobacco: Never Used  Substance Use Topics  . Alcohol use: Yes    Comment: occasional  . Drug use: Not Currently    Home Medications Prior to Admission medications  Medication Sig Start Date End Date Taking? Authorizing Provider  atenolol (TENORMIN) 25 MG tablet Take 1 tablet (25 mg total) by mouth daily. You may take an extra tablet as needed for palpitations 08/04/18   Quintella Reichert, MD  Fremanezumab-vfrm (AJOVY) 225 MG/1.5ML SOAJ Inject 225 mg into the skin every 30 (thirty) days. 04/16/19   Anson Fret, MD  lisinopril-hydrochlorothiazide (ZESTORETIC) 20-25 MG tablet Take 1 tablet by mouth daily. 02/26/19   [provider]  naproxen (NAPROSYN) 500 MG tablet Take 1 tablet (500 mg total) by mouth 2 (two) times daily. 04/04/19   Wieters, Hallie C, PA-C  nortriptyline (PAMELOR) 10 MG capsule Start with one capsule(10mg ) an hour before bedtime. May increase by one pill weekly as needed until taking 3 capsules(30mg ) an hour before bedtime 04/09/19   Anson Fret, MD  pantoprazole (PROTONIX) 40 MG tablet Take 1  tablet (40 mg total) by mouth daily. To reduce stomach acid 03/30/19   Marcine Matar, MD  PARoxetine (PAXIL) 20 MG tablet Take 1 tablet (20 mg total) by mouth daily for 14 days, THEN 1.5 tablets (30 mg total) daily. 04/11/19 06/24/19  Pucilowski, Roosvelt Maser, MD  rizatriptan (MAXALT-MLT) 10 MG disintegrating tablet Take 1 tablet (10 mg total) by mouth as needed for migraine. May repeat in 2 hours if needed 04/09/19   Anson Fret, MD    Allergies    Diflucan [fluconazole]  Review of Systems   Review of Systems  Constitutional: Negative for fever.  Eyes: Negative for pain.  Gastrointestinal: Negative for abdominal pain and vomiting.  Musculoskeletal: Negative for back pain.  Neurological: Positive for headaches and paresthesias. Negative for weakness.  All other systems reviewed and are negative.   Physical Exam Updated Vital Signs BP 135/76 (BP Location: Left Arm)   Pulse 69   Temp 99 F (37.2 C) (Oral)   Resp 16   Ht 5\' 11"  (1.803 m)   Wt 101.6 kg   LMP 03/26/2019 (Approximate)   SpO2 100%   BMI 31.24 kg/m   Physical Exam Vitals and nursing note reviewed.  Constitutional:      Appearance: She is well-developed.  HENT:     Head: Normocephalic and atraumatic.     Mouth/Throat:     Mouth: Mucous membranes are dry.     Pharynx: Oropharynx is clear.  Eyes:     Conjunctiva/sclera: Conjunctivae normal.  Cardiovascular:     Rate and Rhythm: Normal rate and regular rhythm.  Pulmonary:     Effort: No respiratory distress.     Breath sounds: No stridor.  Abdominal:     General: There is no distension.     Tenderness: There is no rebound.     Hernia: No hernia is present.  Musculoskeletal:        General: No swelling, tenderness, deformity or signs of injury. Normal range of motion.     Cervical back: Normal range of motion.  Skin:    General: Skin is warm and dry.     Coloration: Skin is not jaundiced.     Findings: No bruising.  Neurological:     General: No  focal deficit present.     Mental Status: She is alert.     Comments: No altered mental status, able to give full seemingly accurate history.  Face is symmetric, EOM's intact, pupils equal and reactive, vision intact, tongue and uvula midline without deviation. Upper and Lower extremity motor 5/5, intact pain perception in distal extremities, 2+  reflexes in biceps, patella and achilles tendons. Able to perform finger to nose normal with both hands. Walks without assistance or evident ataxia.     ED Results / Procedures / Treatments   Labs (all labs ordered are listed, but only abnormal results are displayed) Labs Reviewed  URINALYSIS, ROUTINE W REFLEX MICROSCOPIC - Abnormal; Notable for the following components:      Result Value   APPearance HAZY (*)    Hgb urine dipstick TRACE (*)    All other components within normal limits  URINALYSIS, MICROSCOPIC (REFLEX) - Abnormal; Notable for the following components:   Bacteria, UA MANY (*)    All other components within normal limits    EKG None  Radiology No results found.  Procedures Procedures (including critical care time)  Medications Ordered in ED Medications  LORazepam (ATIVAN) tablet 0.5 mg (0.5 mg Oral Given 04/20/19 0439)  ketorolac (TORADOL) injection 30 mg (30 mg Intramuscular Given 04/20/19 0440)    ED Course  I have reviewed the triage vital signs and the nursing notes.  Pertinent labs & imaging results that were available during my care of the patient were reviewed by me and considered in my medical decision making (see chart for details).    MDM Rules/Calculators/A&P                      No focal neuro deficit. Symptoms seem more consistent with anxiety and will try to treat the same. Also with malodorous urine, will check urine. otherwise stable for neuro follow up as scheduled.    Final Clinical Impression(s) / ED Diagnoses Final diagnoses:  Paresthesia  Chronic nonintractable headache, unspecified headache  type    Rx / DC Orders ED Discharge Orders    None       Rasmus Preusser, Corene Cornea, MD 04/20/19 [phone]

## 2019-04-26 ENCOUNTER — Other Ambulatory Visit: Payer: Self-pay | Admitting: Internal Medicine

## 2019-04-26 ENCOUNTER — Other Ambulatory Visit: Payer: Self-pay | Admitting: Cardiology

## 2019-04-26 DIAGNOSIS — K219 Gastro-esophageal reflux disease without esophagitis: Secondary | ICD-10-CM

## 2019-04-26 MED ORDER — PANTOPRAZOLE SODIUM 40 MG PO TBEC: 40.0000 mg | DELAYED_RELEASE_TABLET | Freq: Every day | ORAL | 0 refills | Status: DC

## 2019-04-26 MED ORDER — LISINOPRIL-HYDROCHLOROTHIAZIDE 20-25 MG PO TABS: 1.0000 | ORAL_TABLET | Freq: Every day | ORAL | 0 refills | Status: DC

## 2019-04-27 MED FILL — PANTOPRAZOLE SOD DR 40 MG T: 40 | 30 days supply | Qty: 30 | Fill #0

## 2019-05-18 ENCOUNTER — Other Ambulatory Visit: Payer: Self-pay

## 2019-05-18 ENCOUNTER — Telehealth (HOSPITAL_COMMUNITY): Payer: Self-pay | Admitting: Psychiatry

## 2019-05-25 ENCOUNTER — Ambulatory Visit (HOSPITAL_COMMUNITY): Payer: Medicaid Other | Admitting: Psychiatry

## 2019-05-30 ENCOUNTER — Ambulatory Visit: Payer: Medicaid Other | Admitting: Family Medicine

## 2019-06-05 ENCOUNTER — Other Ambulatory Visit: Payer: Self-pay | Admitting: Internal Medicine

## 2019-06-05 DIAGNOSIS — K219 Gastro-esophageal reflux disease without esophagitis: Secondary | ICD-10-CM

## 2019-06-05 MED ORDER — PANTOPRAZOLE SODIUM 40 MG PO TBEC: 40.0000 mg | DELAYED_RELEASE_TABLET | Freq: Every day | ORAL | 0 refills | Status: DC

## 2019-06-05 MED FILL — PANTOPRAZOLE SOD DR 40 MG T: 40 | 30 days supply | Qty: 30 | Fill #0

## 2019-07-04 ENCOUNTER — Other Ambulatory Visit: Payer: Self-pay | Admitting: Internal Medicine

## 2019-07-04 DIAGNOSIS — K219 Gastro-esophageal reflux disease without esophagitis: Secondary | ICD-10-CM

## 2019-07-06 MED ORDER — PANTOPRAZOLE SODIUM 40 MG PO TBEC: 40.0000 mg | DELAYED_RELEASE_TABLET | Freq: Every day | ORAL | 0 refills | Status: DC

## 2019-07-06 MED FILL — PANTOPRAZOLE SOD DR 40 MG T: 40 | 30 days supply | Qty: 30 | Fill #0

## 2019-07-06 NOTE — Telephone Encounter (Signed)
Refilled

## 2019-07-06 NOTE — Addendum Note (Signed)
Addended byHoy Register on: 07/06/2019 08:55 AM   Modules accepted: Orders

## 2019-07-10 ENCOUNTER — Ambulatory Visit: Payer: 59 | Admitting: Family Medicine

## 2019-07-10 ENCOUNTER — Encounter: Payer: Self-pay | Admitting: Family Medicine

## 2019-07-10 NOTE — Progress Notes (Deleted)
PATIENT: Diane Lloyd DOB: 06/22/1990  REASON FOR VISIT: follow up HISTORY FROM: patient  No chief complaint on file.    HISTORY OF PRESENT ILLNESS: Today 07/10/19 Diane Lloyd is a 29 y.o. female here today for follow up for migraines. She was started on nortriptyline and rizatriptan when last seen on 3/29. On 4/5 she called to report headaches were no better. Ajovy was added at that time.   HISTORY: (copied from Dr Trevor Mace note on 04/09/2019)  HPI:  Diane Lloyd is a 29 y.o. female here as requested by Anders Simmonds, PA-C for headaches. PMHx PCOS, astma, GERD. I reviewed Dr. Vassie Moment notes, she presented in January with some mild headache and dizziness with sinus pressure in the frontal area of the head, in the setting of a temperature, and lymph node swelling for 1 week.  Several days after this appointment she went to the emergency room for headache, top of the head worse on the right side, extending into the face, Tylenol did not help, has had episodes in the past similar starting in 2019, she reportedly saw neurology in the past with head CT and MRI which were negative, she denied dizziness, stated blurred vision to the right eye, no nausea or vomiting, no light sensitivity.  There were no red flags on exam, no focal findings on examination, Toradol was given.  I also reviewed neurology's notes, she was seen in February 2020, onset of headache December 2019, in the back of the head with associated neck pain, no aura, some associated dizziness but denied associated nausea, vomiting, photophobia, phonophobia, visual disturbance or unilateral numbness or weakness, can last about 10 to 15 minutes about 2 times in a 24-hour.,  Twice a week, Tylenol did not work, sometimes with associated episodes of paresthesia.  She was diagnosed with episodic tension type headaches not requiring pharmacologic intervention, Tylenol extra strength effective, episodic paresthesias of unclear  etiology possibly related to anxiety.  She is here alone. She has been having headaches in different spots, they are on the top of her head, sharp pains, to burning, to dull aches, radiates to her face, can be pulsating and throbbing, can be on the left side too. Some sound sensitivity and some nausea. They can last 5-10 minutes or at most 30-45 minutes. She tries to wait it out, toradol helps, she gets them every day, unknown triggers except under a lot of stress. She can have them 3-4x a day, mostly mid day, she is a CNA, ongoing since 2019, her mother has migraines. No plans for pregnancyin the next year. She doesn't have a problem going to sleep but staying asleep. She does not wake up headaches or dry mouth. She has tingling in the hands, may be CTS.   B12 872, hgba1c 5.4, tsh normal  Meds tried: Tizanidine, metoprolol, trazodone, paxil, tylenol, ibuprofen, atenolol, naproxen, norvasc, toradol, reglan(at the ED), toradol IM, phernergan, zofran, robaxin, meclizine   Reviewed notes, labs and imaging from outside physicians, which showed:  Personally reviewed images CT of the head July 2020 and MRI of the brain April 2019 which were both normal.   REVIEW OF SYSTEMS: Out of a complete 14 system review of symptoms, the patient complains only of the following symptoms, and all other reviewed systems are negative.  ALLERGIES: Allergies  Allergen Reactions  . Diflucan [Fluconazole] Itching and Rash    HOME MEDICATIONS: Outpatient Medications Prior to Visit  Medication Sig Dispense Refill  . atenolol (TENORMIN) 25  MG tablet Take 1 tablet (25 mg total) by mouth daily. You may take an extra tablet as needed for palpitations 45 tablet 6  . Fremanezumab-vfrm (AJOVY) 225 MG/1.5ML SOAJ Inject 225 mg into the skin every 30 (thirty) days. 1 pen 11  . lisinopril-hydrochlorothiazide (ZESTORETIC) 20-25 MG tablet Take 1 tablet by mouth daily. Please make yearly appt with Dr. Mayford Knife for July before  anymore refills. 1st attempt 90 tablet 0  . naproxen (NAPROSYN) 500 MG tablet Take 1 tablet (500 mg total) by mouth 2 (two) times daily. 30 tablet 0  . nortriptyline (PAMELOR) 10 MG capsule Start with one capsule(10mg ) an hour before bedtime. May increase by one pill weekly as needed until taking 3 capsules(30mg ) an hour before bedtime 90 capsule 6  . pantoprazole (PROTONIX) 40 MG tablet Take 1 tablet (40 mg total) by mouth daily. To reduce stomach acid 90 tablet 0  . PARoxetine (PAXIL) 20 MG tablet Take 1 tablet (20 mg total) by mouth daily for 14 days, THEN 1.5 tablets (30 mg total) daily. 104 tablet 0  . rizatriptan (MAXALT-MLT) 10 MG disintegrating tablet Take 1 tablet (10 mg total) by mouth as needed for migraine. May repeat in 2 hours if needed 9 tablet 11   No facility-administered medications prior to visit.    PAST MEDICAL HISTORY: Past Medical History:  Diagnosis Date  . Anemia    iron def  . Anxiety   . Asthma   . Hypertension   . Obesity, unspecified   . PCOS (polycystic ovarian syndrome)   . Positive ANA (antinuclear antibody) 05/05/2017   done by [facility]umatology in [facility]; ANA 1:160, speckled pattern    PAST SURGICAL HISTORY: Past Surgical History:  Procedure Laterality Date  . TONSILLECTOMY      FAMILY HISTORY: Family History  Problem Relation Age of Onset  . Cancer Father 56       esophagus  . Asthma Father   . Esophageal cancer Father   . Hypertension Mother   . Lupus Mother   . Diabetes Mother   . Migraines Mother   . Cancer Maternal Uncle 40       pancreatic cancer  . Cancer Paternal Grandmother 85       esophageal  . Anxiety disorder Paternal Aunt   . Depression Paternal Aunt   . Anxiety disorder Cousin   . Colon cancer Neg Hx   . Ulcerative colitis Neg Hx     SOCIAL HISTORY: Social History   Socioeconomic History  . Marital status: Single    Spouse name: Not on file  . Number of children: 0  . Years of education: Not on file  .  Highest education level: Some college, no degree  Occupational History  . Occupation: CSR    Employer: [employer]  Tobacco Use  . Smoking status: Never Smoker  . Smokeless tobacco: Never Used  Vaping Use  . Vaping Use: Never used  Substance and Sexual Activity  . Alcohol use: Yes    Comment: occasional  . Drug use: Not Currently  . Sexual activity: Yes    Birth control/protection: None  Other Topics Concern  . Not on file  Social History Narrative   Patient is right-handed. She lives alone. She occasionally drinks caffeine. She does not exercise.   Social Determinants of Health   Financial Resource Strain:   . Difficulty of Paying Living Expenses:   Food Insecurity:   . Worried About Programme researcher, broadcasting/film/video in the Last Year:   .  Ran Out of Food in the Last Year:   Transportation Needs:   . Freight forwarderLack of Transportation (Medical):   Marland Kitchen. Lack of Transportation (Non-Medical):   Physical Activity:   . Days of Exercise per Week:   . Minutes of Exercise per Session:   Stress:   . Feeling of Stress :   Social Connections:   . Frequency of Communication with Friends and Family:   . Frequency of Social Gatherings with Friends and Family:   . Attends Religious Services:   . Active Member of Clubs or Organizations:   . Attends BankerClub or Organization Meetings:   Marland Kitchen. Marital Status:   Intimate Partner Violence:   . Fear of Current or Ex-Partner:   . Emotionally Abused:   Marland Kitchen. Physically Abused:   . Sexually Abused:       PHYSICAL EXAM  There were no vitals filed for this visit. There is no height or weight on file to calculate BMI.  Generalized: Well developed, in no acute distress  Cardiology: normal rate and rhythm, no murmur noted Respiratory: clear to auscultation bilaterally  Neurological examination  Mentation: Alert oriented to time, place, history taking. Follows all commands speech and language fluent Cranial nerve II-XII: Pupils were equal round reactive to light. Extraocular  movements were full, visual field were full on confrontational test. Facial sensation and strength were normal. Uvula tongue midline. Head turning and shoulder shrug  were normal and symmetric. Motor: The motor testing reveals 5 over 5 strength of all 4 extremities. Good symmetric motor tone is noted throughout.  Sensory: Sensory testing is intact to soft touch on all 4 extremities. No evidence of extinction is noted.  Coordination: Cerebellar testing reveals good finger-nose-finger and heel-to-shin bilaterally.  Gait and station: Gait is normal. Tandem gait is normal. Romberg is negative. No drift is seen.  Reflexes: Deep tendon reflexes are symmetric and normal bilaterally.   DIAGNOSTIC DATA (LABS, IMAGING, TESTING) - I reviewed patient records, labs, notes, testing and imaging myself where available.  No flowsheet data found.   Lab Results  Component Value Date   WBC 4.4 04/05/2019   HGB 12.2 04/05/2019   HCT 39.4 04/05/2019   MCV 83 04/05/2019   PLT 319 04/05/2019      Component Value Date/Time   NA 139 04/05/2019 1444   K 3.9 04/05/2019 1444   CL 102 04/05/2019 1444   CO2 24 04/05/2019 1444   GLUCOSE 87 04/05/2019 1444   GLUCOSE 102 (H) 01/02/2019 2321   BUN 10 04/05/2019 1444   CREATININE 0.81 04/05/2019 1444   CREATININE 0.86 12/12/2015 1441   CALCIUM 10.0 04/05/2019 1444   PROT 7.6 12/22/2018 1026   ALBUMIN 4.5 12/22/2018 1026   AST 14 12/22/2018 1026   ALT 22 12/22/2018 1026   ALKPHOS 57 12/22/2018 1026   BILITOT 0.3 12/22/2018 1026   GFRNONAA 100 04/05/2019 1444   GFRNONAA >89 12/12/2015 1441   GFRAA 115 04/05/2019 1444   GFRAA >89 12/12/2015 1441   Lab Results  Component Value Date   CHOL 135 12/12/2015   HDL 36 (L) 12/12/2015   LDLCALC 67 12/12/2015   TRIG 158 (H) 12/12/2015   CHOLHDL 3.8 12/12/2015   Lab Results  Component Value Date   HGBA1C 5.4 04/05/2019   Lab Results  Component Value Date   VITAMINB12 872 04/05/2019   Lab Results  Component  Value Date   TSH 0.927 04/05/2019       ASSESSMENT AND PLAN 29 y.o. year old female  has a past medical history of Anemia, Anxiety, Asthma, Hypertension, Obesity, unspecified, PCOS (polycystic ovarian syndrome), and Positive ANA (antinuclear antibody) (05/05/2017). here with ***  No diagnosis found.     No orders of the defined types were placed in this encounter.    No orders of the defined types were placed in this encounter.     I spent 15 minutes with the patient. 50% of this time was spent counseling and educating patient on plan of care and medications.    Shawnie Dapper, FNP-C 07/10/2019, 1:42 PM [facility] [address] [place], [place] [address] [phone]

## 2019-07-26 ENCOUNTER — Other Ambulatory Visit: Payer: Self-pay

## 2019-07-26 MED ORDER — LISINOPRIL-HYDROCHLOROTHIAZIDE 20-25 MG PO TABS: 1.0000 | ORAL_TABLET | Freq: Every day | ORAL | 0 refills | Status: DC

## 2019-08-01 ENCOUNTER — Other Ambulatory Visit: Payer: Self-pay

## 2019-08-01 MED ORDER — LISINOPRIL-HYDROCHLOROTHIAZIDE 20-25 MG PO TABS: 1.0000 | ORAL_TABLET | Freq: Every day | ORAL | 0 refills | Status: DC

## 2019-08-04 ENCOUNTER — Other Ambulatory Visit: Payer: Self-pay | Admitting: Physician Assistant

## 2019-08-05 ENCOUNTER — Other Ambulatory Visit: Payer: Self-pay | Admitting: Cardiology

## 2019-08-06 ENCOUNTER — Telehealth: Payer: Self-pay | Admitting: Cardiology

## 2019-08-06 MED ORDER — ATENOLOL 25 MG PO TABS: 25.0000 mg | ORAL_TABLET | Freq: Every day | ORAL | 0 refills | Status: DC

## 2019-08-06 NOTE — Addendum Note (Signed)
Addended by: Margaret Pyle D on: 08/06/2019 09:29 AM   Modules accepted: Orders

## 2019-08-06 NOTE — Telephone Encounter (Signed)
Pt's medication was sent to pt's pharmacy as requested. Confirmation received.  °

## 2019-08-06 NOTE — Telephone Encounter (Signed)
*  STAT* If patient is at the pharmacy, call can be transferred to refill team.   1. Which medications need to be refilled? (please list name of each medication and dose if known) atenolol (TENORMIN) 25 MG tablet    2. Which pharmacy/location (including street and city if local pharmacy) is medication to be sent to? [facility] - [place] ([place] - [address]  3. Do they need a 30 day or 90 day supply? 90

## 2019-08-08 ENCOUNTER — Other Ambulatory Visit: Payer: Self-pay | Admitting: Family Medicine

## 2019-08-08 DIAGNOSIS — K219 Gastro-esophageal reflux disease without esophagitis: Secondary | ICD-10-CM

## 2019-08-08 MED ORDER — PANTOPRAZOLE SODIUM 40 MG PO TBEC: 40.0000 mg | DELAYED_RELEASE_TABLET | Freq: Every day | ORAL | 0 refills | Status: DC

## 2019-08-08 MED FILL — PANTOPRAZOLE SOD DR 40 MG T: 40 | 30 days supply | Qty: 30 | Fill #0

## 2019-08-14 ENCOUNTER — Telehealth: Payer: Self-pay

## 2019-08-14 NOTE — Telephone Encounter (Signed)
Pt called with persistent sore throat. Offered mobile clinic. Pt states will go to urgent care.

## 2019-08-27 ENCOUNTER — Other Ambulatory Visit: Payer: Self-pay

## 2019-08-27 MED ORDER — LISINOPRIL-HYDROCHLOROTHIAZIDE 20-25 MG PO TABS: 1.0000 | ORAL_TABLET | Freq: Every day | ORAL | 0 refills | Status: DC

## 2019-08-27 NOTE — Telephone Encounter (Signed)
Please check with patient - this had been discontinued

## 2019-08-28 NOTE — Telephone Encounter (Signed)
Attempted to contact patient. Unable to leave voicemail.

## 2019-09-08 ENCOUNTER — Ambulatory Visit (HOSPITAL_COMMUNITY): Admission: EM | Admit: 2019-09-08 | Discharge: 2019-09-08 | Discharge status: Against Medical Advice | Payer: Self-pay

## 2019-09-08 ENCOUNTER — Other Ambulatory Visit: Payer: Self-pay

## 2019-09-08 NOTE — ED Notes (Signed)
Per Pt Access, pt does not wish to wait to be seen

## 2019-09-13 NOTE — Progress Notes (Signed)
Cardiology Office Note    Date:  09/14/2019   ID:  Diane Lloyd, DOB 08-07-1990, MRN [id number]  PCP:  Cain Saupe, MD  Cardiologist: Armanda Magic, MD EPS: None  Chief Complaint  Patient presents with  . Follow-up    PVCs and HTN    History of Present Illness:  Diane Lloyd is a 29 y.o. female with history of HTN, obesity, asthma, PCOS, and chronic anemia. She was seen in the emergency room on 03/01/2018 with complaints of intermittent chest pain and palpitations. She has been to the ER several times with negative work-up.  In review she has had ER visits since December for chest pain as well as headaches.    She has had an extensive cardiac workup with coronary CTA showing no CAD and calcium score of 0, normal cardiac MRI, normal echo and rare PVCs on heart monitor.    She is here today for followup and is doing well.  She denies any chest pain or pressure, SOB, DOE, PND, orthopnea, LE edema, dizziness,  or syncope. She recently has noticed more palpitations than before.  She admits to drinking more caffeine and is drinking 4-5 shots of Tequila every other night.  She is compliant with her meds and is tolerating meds with no SE.    Past Medical History:  Diagnosis Date  . Anemia    iron def  . Anxiety   . Asthma   . Hypertension   . Obesity, unspecified   . PCOS (polycystic ovarian syndrome)   . Positive ANA (antinuclear antibody) 05/05/2017   done by [facility] in [facility]; ANA 1:160, speckled pattern    Past Surgical History:  Procedure Laterality Date  . TONSILLECTOMY      Current Medications: Current Meds  Medication Sig  . atenolol (TENORMIN) 25 MG tablet Take 1 tablet (25 mg total) by mouth daily. You may take an extra tablet as needed for palpitations. Please keep upcoming appt with Dr. Mayford Knife. Thank you  . butalbital-acetaminophen-caffeine (FIORICET) 50-325-40 MG tablet Take 1 tablet by mouth every 4 (four) hours as needed for headache.  .  lisinopril-hydrochlorothiazide (ZESTORETIC) 20-25 MG tablet Take 1 tablet by mouth daily.  . pantoprazole (PROTONIX) 40 MG tablet Take 1 tablet (40 mg total) by mouth daily. To reduce stomach acid  . [DISCONTINUED] lisinopril-hydrochlorothiazide (ZESTORETIC) 20-25 MG tablet Take 1 tablet by mouth daily.     Allergies:   Diflucan [fluconazole]   Social History   Socioeconomic History  . Marital status: Single    Spouse name: Not on file  . Number of children: 0  . Years of education: Not on file  . Highest education level: Some college, no degree  Occupational History  . Occupation: CSR    Employer: [employer]  Tobacco Use  . Smoking status: Never Smoker  . Smokeless tobacco: Never Used  Vaping Use  . Vaping Use: Never used  Substance and Sexual Activity  . Alcohol use: Yes    Comment: occasional  . Drug use: Not Currently  . Sexual activity: Yes    Birth control/protection: None  Other Topics Concern  . Not on file  Social History Narrative   Patient is right-handed. She lives alone. She occasionally drinks caffeine. She does not exercise.   Social Determinants of Health   Financial Resource Strain:   . Difficulty of Paying Living Expenses: Not on file  Food Insecurity:   . Worried About Programme researcher, broadcasting/film/video in the Last Year:  Not on file  . Ran Out of Food in the Last Year: Not on file  Transportation Needs:   . Lack of Transportation (Medical): Not on file  . Lack of Transportation (Non-Medical): Not on file  Physical Activity:   . Days of Exercise per Week: Not on file  . Minutes of Exercise per Session: Not on file  Stress:   . Feeling of Stress : Not on file  Social Connections:   . Frequency of Communication with Friends and Family: Not on file  . Frequency of Social Gatherings with Friends and Family: Not on file  . Attends Religious Services: Not on file  . Active Member of Clubs or Organizations: Not on file  . Attends Banker Meetings: Not on  file  . Marital Status: Not on file     Family History:  The patient's   family history includes Anxiety disorder in her cousin and paternal aunt; Asthma in her father; Cancer (age of onset: 46) in her maternal uncle; Cancer (age of onset: 30) in her father; Cancer (age of onset: 59) in her paternal grandmother; Depression in her paternal aunt; Diabetes in her mother; Esophageal cancer in her father; Hypertension in her mother; Lupus in her mother; Migraines in her mother.   ROS:   Please see the history of present illness.    ROS All other systems reviewed and are negative.   PHYSICAL EXAM:   VS:  BP 120/66   Pulse 68   Ht 5\' 11"  (1.803 m)   Wt 241 lb (109.3 kg)   SpO2 94%   BMI 33.61 kg/m   Physical Exam  GEN: Well nourished, well developed in no acute distress HEENT: Normal NECK: No JVD; No carotid bruits LYMPHATICS: No lymphadenopathy CARDIAC:RRR, no murmurs, rubs, gallops RESPIRATORY:  Clear to auscultation without rales, wheezing or rhonchi  ABDOMEN: Soft, non-tender, non-distended MUSCULOSKELETAL:  No edema; No deformity  SKIN: Warm and dry NEUROLOGIC:  Alert and oriented x 3 PSYCHIATRIC:  Normal affect    Wt Readings from Last 3 Encounters:  09/14/19 241 lb (109.3 kg)  04/20/19 224 lb (101.6 kg)  04/09/19 223 lb (101.2 kg)      Studies/Labs Reviewed:   EKG:  EKG is ordered today and showed   Recent Labs: 12/22/2018: ALT 22 04/05/2019: BUN 10; Creatinine, Ser 0.81; Hemoglobin 12.2; Platelets 319; Potassium 3.9; Sodium 139; TSH 0.927   Lipid Panel    Component Value Date/Time   CHOL 135 12/12/2015 1441   TRIG 158 (H) 12/12/2015 1441   HDL 36 (L) 12/12/2015 1441   CHOLHDL 3.8 12/12/2015 1441   VLDL 32 (H) 12/12/2015 1441   LDLCALC 67 12/12/2015 1441    Additional studies/ records that were reviewed today include:   2D echo 05/17/18      1. The left ventricle has normal systolic function with an ejection fraction of 60-65%. The cavity size was normal.  Left ventricular diastolic parameters were normal. No evidence of left ventricular regional wall motion abnormalities. GLS normal,  -22.5%.  2. The right ventricle has normal systolic function. The cavity was normal. There is no increase in right ventricular wall thickness.  3. The aortic valve is tricuspid. No stenosis of the aortic valve.  4. The aortic root is normal in size and structure.  5. No evidence of mitral valve stenosis. No mitral regurgitation.  6. The IVC was normal in size. No complete TR doppler jet so unable to estimate PA systolic pressure.  FINDINGS  Left Ventricle: The left ventricle has normal systolic function, with an ejection fraction of 60-65%. The cavity size was normal. There is no increase in left ventricular wall thickness. Left ventricular diastolic parameters were normal. No evidence of  left ventricular regional wall motion abnormalities..   Right Ventricle: The right ventricle has normal systolic function. The cavity was normal. There is no increase in right ventricular wall thickness.   Left Atrium: Left atrial size was normal in size.   Right Atrium: Right atrial size was normal in size.   Interatrial Septum: No atrial level shunt detected by color flow Doppler.   Pericardium: There is no evidence of pericardial effusion.   Mitral Valve: The mitral valve is normal in structure. Mitral valve regurgitation is not visualized by color flow Doppler. No evidence of mitral valve stenosis.   Tricuspid Valve: The tricuspid valve is normal in structure. Tricuspid valve regurgitation was not visualized by color flow Doppler.   Aortic Valve: The aortic valve is tricuspid Aortic valve regurgitation was not visualized by color flow Doppler. There is No stenosis of the aortic valve.   Pulmonic Valve: The pulmonic valve was normal in structure. Pulmonic valve regurgitation is not visualized by color flow Doppler.   Aorta: The aortic root is normal in size and  structure.   Venous: The inferior vena cava is normal in size with greater than 50% respiratory variability.      holter monitor 06/2018 Sinus bradycardia, normal sinus rhythm and sinus tachycardia. The average heart rate was 77bpm. The heart rate ranged from 45 to 178bpm.  Rare PVCs   ETT 10/2018 Study Highlights    Blood pressure demonstrated a normal response to exercise.  There was no ST segment deviation noted during stress.   ETT with mildly impaired exercise tolerance (5:00); no chest pain; normal blood pressure response; no ST changes; negative inadequate exercise treadmill (patient achieved 83% target heart rate); Duke treadmill score 5.   Coronary CTA 01/2019 IMPRESSION: 1. Coronary calcium score of 0.  2. Normal coronary origin with right dominance.  3. No evidence of CAD.  4. CAD-RADS 0. No evidence of CAD (0%). Consider non-atherosclerotic causes of chest pain.  Cardiac MRI 12/2018 IMPRESSION: 1. Normal left ventricular size, with mild concentric hypertrophy and normal systolic function (LVEF = 66%). There are no regional wall motion abnormalities and no late gadolinium enhancement in the left ventricular myocardium.  2. Normal right ventricular size, thickness and systolic function (LVEF = 61%). There are no regional wall motion abnormalities.  3.  Normal left and right atrial size.  4. Normal size of the aortic root, ascending aorta and pulmonary artery.  5.  No significant valvular abnormalities.  6.  Normal pericardium. Trivial pericardial effusion.  7. This is a normal cardiac MRI, Left main has normal origin, RCA is not visualized on this study, if clinical suspicion for ischemia is high, a coronary CTA is recommended.    ASSESSMENT:    1. PVC (premature ventricular contraction)   2. Chest pain of uncertain etiology   3. Essential hypertension      PLAN:  In order of problems listed above:  1.  PVCs -she has had more  palpitations recently but has been drinking more caffeine and drinking 4-5 shots of Tequila QOD -encouraged her to cut out ETOH and cut down on caffeine intake -continue Atenolol 25mg daily -coronary CTA with no CAD and cardiac MRI normal  2.  Chest pain - noncardiac -no CAD and no  coronary CA on CTA  3.  Essential HTN  -BP controlled -she says sometimes her SBP drops into the 90's -I explained that caffeine can act as a diuretic and she can also get dehydrated with ETOH so she should cut back on those.  If BP consistently staying low she need to let her PCP know and may need to cut back on ACEi/diuretic.  -continue Atenolol 25mg  daily and Lisinopril-HCT 20-25mg  daily   Medication Adjustments/Labs and Tests Ordered: Current medicines are reviewed at length with the patient today.  Concerns regarding medicines are outlined above.  Medication changes, Labs and Tests ordered today are listed in the Patient Instructions below. There are no Patient Instructions on file for this visit.   Signed, , MD  09/14/2019 2:22 PM    [facility] [address] [place], [place], [place]  [place] Phone: [phone]; Fax: [phone]

## 2019-09-14 ENCOUNTER — Encounter: Payer: Self-pay | Admitting: Cardiology

## 2019-09-14 ENCOUNTER — Ambulatory Visit (INDEPENDENT_AMBULATORY_CARE_PROVIDER_SITE_OTHER): Payer: Self-pay | Admitting: Cardiology

## 2019-09-14 ENCOUNTER — Other Ambulatory Visit: Payer: Self-pay

## 2019-09-14 VITALS — BP 120/66 | HR 68 | Ht 71.0 in | Wt 241.0 lb

## 2019-09-14 DIAGNOSIS — I10 Essential (primary) hypertension: Secondary | ICD-10-CM

## 2019-09-14 DIAGNOSIS — I493 Ventricular premature depolarization: Secondary | ICD-10-CM

## 2019-09-14 DIAGNOSIS — R079 Chest pain, unspecified: Secondary | ICD-10-CM

## 2019-09-14 MED ORDER — LISINOPRIL-HYDROCHLOROTHIAZIDE 20-25 MG PO TABS: 1.0000 | ORAL_TABLET | Freq: Every day | ORAL | 3 refills | Status: DC

## 2019-09-14 NOTE — Patient Instructions (Signed)
Dr. Mayford Knife recommends that you decrease your intake of alcohol and caffeine.   Medication Instructions:  Your physician recommends that you continue on your current medications as directed. Please refer to the Current Medication list given to you today.  *If you need a refill on your cardiac medications before your next appointment, please call your pharmacy*   Follow-Up: At [facility], you and your health needs are our priority.  As part of our continuing mission to provide you with exceptional heart care, we have created designated Provider Care Teams.  These Care Teams include your primary Cardiologist (physician) and Advanced Practice Providers (APPs -  Physician Assistants and Nurse Practitioners) who all work together to provide you with the care you need, when you need it.  Your next appointment:   1 year(s)  The format for your next appointment:   In Person  Provider:   You may see Armanda Magic, MD or one of the following Advanced Practice Providers on your designated Care Team:    Ronie Spies, PA-C  Jacolyn Reedy, PA-C

## 2019-10-08 ENCOUNTER — Other Ambulatory Visit: Payer: Self-pay | Admitting: Family Medicine

## 2019-10-08 ENCOUNTER — Encounter: Payer: Self-pay | Admitting: Family Medicine

## 2019-10-08 DIAGNOSIS — R110 Nausea: Secondary | ICD-10-CM

## 2019-10-08 MED ORDER — ONDANSETRON HCL 4 MG PO TABS: 4.0000 mg | ORAL_TABLET | Freq: Three times a day (TID) | ORAL | 0 refills | Status: DC | PRN

## 2019-10-08 NOTE — Progress Notes (Signed)
Patient ID: Diane Lloyd, female   DOB: April 04, 1990, 29 y.o.   MRN: [id number]   Patient sent Mychart message that she is having nausea due to COVID and requests that medication be sent to [facility]. I did not see any recent COVID testing in chart so perhaps this was done at an outside location. RX will be sent in for Zofran and patient also mentions chest congestion and will be encouraged to take otc Mucinex with ED follow-up if symptoms worsen.

## 2019-10-08 NOTE — Telephone Encounter (Signed)
Copied from CRM [id number]. Topic: General - Other >> Oct 08, 2019  9:30 AM Tamela Oddi wrote: Reason for CRM: Patient called to ask the nurse to call her regarding the symptoms she is having from covid.  She stated that she has a lot of congestion in her chest and wanted to know what she can take for it.  Please advise and call at [phone]

## 2019-10-11 ENCOUNTER — Encounter: Payer: Medicaid Other | Admitting: Physician Assistant

## 2019-10-12 ENCOUNTER — Other Ambulatory Visit: Payer: Self-pay

## 2019-10-12 ENCOUNTER — Emergency Department (HOSPITAL_COMMUNITY)
Admission: EM | Admit: 2019-10-12 | Discharge: 2019-10-12 | Disposition: A | Discharge status: Home / Self Care | Payer: BC Managed Care – PPO | Attending: Emergency Medicine | Admitting: Emergency Medicine

## 2019-10-12 ENCOUNTER — Encounter (HOSPITAL_COMMUNITY): Payer: Self-pay

## 2019-10-12 ENCOUNTER — Emergency Department (HOSPITAL_COMMUNITY): Payer: BC Managed Care – PPO

## 2019-10-12 DIAGNOSIS — J4520 Mild intermittent asthma, uncomplicated: Secondary | ICD-10-CM | POA: Diagnosis not present

## 2019-10-12 DIAGNOSIS — Z79899 Other long term (current) drug therapy: Secondary | ICD-10-CM | POA: Diagnosis not present

## 2019-10-12 DIAGNOSIS — R509 Fever, unspecified: Secondary | ICD-10-CM | POA: Diagnosis present

## 2019-10-12 DIAGNOSIS — U071 COVID-19: Secondary | ICD-10-CM | POA: Insufficient documentation

## 2019-10-12 DIAGNOSIS — I10 Essential (primary) hypertension: Secondary | ICD-10-CM | POA: Diagnosis not present

## 2019-10-12 DIAGNOSIS — J189 Pneumonia, unspecified organism: Secondary | ICD-10-CM

## 2019-10-12 DIAGNOSIS — J1282 Pneumonia due to coronavirus disease 2019: Secondary | ICD-10-CM | POA: Insufficient documentation

## 2019-10-12 MED ORDER — PROMETHAZINE-DM 6.25-15 MG/5ML PO SYRP: 5.0000 mL | ORAL_SOLUTION | Freq: Four times a day (QID) | ORAL | 0 refills | Status: DC | PRN

## 2019-10-12 MED ORDER — DOXYCYCLINE HYCLATE 100 MG PO CAPS: 100.0000 mg | ORAL_CAPSULE | Freq: Two times a day (BID) | ORAL | 0 refills | Status: AC

## 2019-10-12 MED ORDER — DICYCLOMINE HCL 20 MG PO TABS: 20.0000 mg | ORAL_TABLET | Freq: Two times a day (BID) | ORAL | 0 refills | Status: DC

## 2019-10-12 MED ORDER — BENZONATATE 100 MG PO CAPS: 100.0000 mg | ORAL_CAPSULE | Freq: Three times a day (TID) | ORAL | 0 refills | Status: DC

## 2019-10-12 MED ORDER — ACETAMINOPHEN 325 MG PO TABS
650.0000 mg | ORAL_TABLET | Freq: Once | ORAL | Status: AC
  Administered 2019-10-12: 650 mg via ORAL
  Filled 2019-10-12: qty 2

## 2019-10-12 MED ORDER — FLUTICASONE PROPIONATE 50 MCG/ACT NA SUSP: 2.0000 | Freq: Every day | NASAL | 0 refills | Status: DC

## 2019-10-12 NOTE — ED Provider Notes (Signed)
[facility]-EMERGENCY DEPT Provider Note   CSN: [id number] Arrival date & time: 10/12/19  1106     History Chief Complaint  Patient presents with  . Covid Positive  . Cough  . Fever    Diane Lloyd is a 29 y.o. female.  HPI Patient is 29 year old female with past medical history of hypertension which is treated, anemia, anxiety, asthma  Patient is presented today for symptoms of Covid she was diagnosed 7 days ago's and had symptoms for 10 days. She endorses chills, fatigue, fever, congestion, sneezing, sore throat, cough, chest tightness, abdominal pain, nausea, myalgias. She denies any chest pain but states that she feels tight in her chest. She states is worse when she takes a deep breath or coughs. She denies any hemoptysis, no hormonal medication including OCPs, no recent long travel, no history of DVT, PTE or VTE. She denies any significant shortness of breath apart from feeling short of breath when she coughs or takes deep breaths.   Patient states that she is very anxious and she called her doctor who recommended she go to the ER for evaluation. She states that she has a pulse oximeter at home which is been within normal limits. She has been taking Tylenol and ibuprofen for her fever and using Zofran for nausea. She states that she has not taken any Tylenol or ibuprofen today.     Past Medical History:  Diagnosis Date  . Anemia    iron def  . Anxiety   . Asthma   . Hypertension   . Obesity, unspecified   . PCOS (polycystic ovarian syndrome)   . Positive ANA (antinuclear antibody) 05/05/2017   done by [facility] in [facility]; ANA 1:160, speckled pattern    Patient Active Problem List   Diagnosis Date Noted  . Chronic nonintractable headache 04/09/2019  . Generalized anxiety disorder with panic attacks 06/20/2018  . Vaginal candidiasis 12/18/2015  . Diarrhea 11/10/2015  . Gastroenteritis 09/24/2015  . Screen for STD (sexually  transmitted disease) 09/21/2015  . Rash and nonspecific skin eruption 08/31/2015  . Dysmenorrhea 08/31/2015  . Allergic rhinitis 05/17/2013  . GERD (gastroesophageal reflux disease) 03/20/2012  . Polycystic ovarian syndrome 12/26/2011  . Abdominal pain 12/26/2011  . Chest pain 11/01/2011  . Family history of cardiac disorder 02/17/2011  . Contraception management 12/30/2010  . 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 Essential hypertension 01/27/2010  . ANEMIA, IRON DEFICIENCY 06/27/2009  . Obesity 03/10/2006  . ASTHMA, INTERMITTENT 03/10/2006  . ECZEMA, ATOPIC DERMATITIS 03/10/2006    Past Surgical History:  Procedure Laterality Date  . TONSILLECTOMY       OB History    Gravida  0   Para      Term      Preterm      AB      Living        SAB      TAB      Ectopic      Multiple      Live Births              Family History  Problem Relation Age of Onset  . Cancer Father 32       esophagus  . Asthma Father   . Esophageal cancer Father   . Hypertension Mother   . Lupus Mother   . Diabetes Mother   . Migraines Mother   . Cancer Maternal Uncle 40  pancreatic cancer  . Cancer Paternal Grandmother 21       esophageal  . Anxiety disorder Paternal Aunt   . Depression Paternal Aunt   . Anxiety disorder Cousin   . Colon cancer Neg Hx   . Ulcerative colitis Neg Hx     Social History   Tobacco Use  . Smoking status: Never Smoker  . Smokeless tobacco: Never Used  Vaping Use  . Vaping Use: Never used  Substance Use Topics  . Alcohol use: Yes    Comment: occasional  . Drug use: Not Currently    Home Medications Prior to Admission medications   Medication Sig Start Date End Date Taking? Authorizing Provider  atenolol (TENORMIN) 25 MG tablet Take 1 tablet (25 mg total) by mouth daily. You may take an extra tablet as needed for palpitations. Please keep upcoming appt with Dr. Mayford Knife. Thank you 08/06/19   Quintella Reichert, MD   benzonatate (TESSALON) 100 MG capsule Take 1 capsule (100 mg total) by mouth every 8 (eight) hours. 10/12/19   Gailen Shelter, PA  butalbital-acetaminophen-caffeine (FIORICET) [id number] MG tablet Take 1 tablet by mouth every 4 (four) hours as needed for headache. 09/07/19   [provider]  dicyclomine (BENTYL) 20 MG tablet Take 1 tablet (20 mg total) by mouth 2 (two) times daily. 10/12/19   Trinitey Roache, Rodrigo Ran, PA  fluticasone (FLONASE) 50 MCG/ACT nasal spray Place 2 sprays into both nostrils daily for 14 days. 10/12/19 10/26/19  Gailen Shelter, PA  lisinopril-hydrochlorothiazide (ZESTORETIC) 20-25 MG tablet Take 1 tablet by mouth daily. 09/14/19   Quintella Reichert, MD  ondansetron (ZOFRAN) 4 MG tablet Take 1 tablet (4 mg total) by mouth every 8 (eight) hours as needed for nausea or vomiting. 10/08/19   Fulp, Cammie, MD  pantoprazole (PROTONIX) 40 MG tablet Take 1 tablet (40 mg total) by mouth daily. To reduce stomach acid 08/08/19   Hoy Register, MD  promethazine-dextromethorphan (PROMETHAZINE-DM) 6.25-15 MG/5ML syrup Take 5 mLs by mouth 4 (four) times daily as needed for cough. 10/12/19   Gailen Shelter, PA    Allergies    Diflucan [fluconazole]  Review of Systems   Review of Systems  Constitutional: Positive for chills, fatigue and fever.  HENT: Positive for congestion, sneezing and sore throat.   Eyes: Negative for pain.  Respiratory: Positive for cough and chest tightness. Negative for shortness of breath.   Cardiovascular: Negative for chest pain and leg swelling.  Gastrointestinal: Positive for abdominal pain and nausea. Negative for vomiting.  Genitourinary: Negative for dysuria.  Musculoskeletal: Positive for myalgias.  Skin: Negative for rash.  Neurological: Negative for dizziness and headaches.    Physical Exam Updated Vital Signs BP 134/90 (BP Location: Right Arm)   Pulse 96   Temp (!) 101.6 F (38.7 C) (Oral)   Resp 19   Ht 5\' 9"  (1.753 m)   Wt 109.3 kg   LMP   (LMP Unknown)   SpO2 99%   BMI 35.59 kg/m   Physical Exam Vitals and nursing note reviewed.  Constitutional:      General: She is not in acute distress.    Appearance: She is obese.  HENT:     Head: Normocephalic and atraumatic.     Nose: Nose normal.  Eyes:     General: No scleral icterus. Cardiovascular:     Rate and Rhythm: Normal rate and regular rhythm.     Pulses: Normal pulses.     Heart sounds: Normal heart  sounds.  Pulmonary:     Effort: Pulmonary effort is normal. No respiratory distress.     Breath sounds: Normal breath sounds. No wheezing.     Comments: Lungs are clear in all fields. Patient is consistently satting 99-100% on room air. No tachypnea. No increased work of breathing. Abdominal:     Palpations: Abdomen is soft.     Tenderness: There is no abdominal tenderness.  Musculoskeletal:     Cervical back: Normal range of motion.     Right lower leg: No edema.     Left lower leg: No edema.  Skin:    General: Skin is warm and dry.     Capillary Refill: Capillary refill takes less than 2 seconds.  Neurological:     Mental Status: She is alert. Mental status is at baseline.  Psychiatric:        Mood and Affect: Mood normal.        Behavior: Behavior normal.     ED Results / Procedures / Treatments   Labs (all labs ordered are listed, but only abnormal results are displayed) Labs Reviewed - No data to display  EKG None  Radiology DG Chest Portable 1 View  Result Date: 10/12/2019 CLINICAL DATA:  Cough, COVID-19 EXAM: PORTABLE CHEST 1 VIEW COMPARISON:  01/14/2019 FINDINGS: The heart size and mediastinal contours are within normal limits. Patchy right basilar airspace opacity. Left lung appears clear. No pleural effusion or pneumothorax. The visualized skeletal structures are unremarkable. IMPRESSION: Patchy right basilar airspace opacity suspicious for pneumonia. Electronically Signed   By: Duanne Guess D.O.   On: 10/12/2019 12:50    Procedures  Procedures (including critical care time)  Medications Ordered in ED Medications  acetaminophen (TYLENOL) tablet 650 mg (650 mg Oral Given 10/12/19 1239)    ED Course  I have reviewed the triage vital signs and the nursing notes.  Pertinent labs & imaging results that were available during my care of the patient were reviewed by me and considered in my medical decision making (see chart for details).  Clinical Course as of Oct 11 1253  Fri Oct 12, 2019  1254 Chest x-ray shows possible patchy right lower lobe infiltrate. This is consistent with post Covid pneumonia. Likely residual from Covid but will cover with doxycycline for 1 week.   [WF]    Clinical Course User Index [WF] Gailen Shelter, [place]   MDM Rules/Calculators/A&P                          Is a 29 year old female with past medical history detailed above presented today for continued cough, fevers, chills, fatigue, cough, chest tightness, congestion.  Physical exam is notable for well-appearing nontachycardic nontachypneic female who is obese and has a mild fever of one 101.6. Physical exam is overwhelmingly reassuring. She is PERC negative and I have very low suspicion for pulmonary embolism. Also very much doubt ACS. Her chest pain is strongly associated with her cough and is very atypical. I am mildly concerned for post Covid pneumonia however given her vital signs and her lung exam which is clear in all fields I have very low suspicion for this. Suspect that this is residual symptoms from Covid.  X-ray interpreted above. Consistent with post Covid pneumonia. We will prescribe patient doxycycline for 1 week as well as antitussives and Bentyl for her abdominal cramps and Flonase for her congestion.  Patient is very well-appearing. No further evaluation is necessary at this  time. She will follow-up closely with her primary care doctor. As she is on day 10/11 of symptoms she is not a candidate for monoclonal antibody infusion.   Lanier ClamDeneshia D Vacha was evaluated in Emergency Department on 10/12/2019 for the symptoms described in the history of present illness. She was evaluated in the context of the global COVID-19 pandemic, which necessitated consideration that the patient might be at risk for infection with the SARS-CoV-2 virus that causes COVID-19. Institutional protocols and algorithms that pertain to the evaluation of patients at risk for COVID-19 are in a state of rapid change based on information released by regulatory bodies including the CDC and federal and state organizations. These policies and algorithms were followed during the patient's care in the ED.  Final Clinical Impression(s) / ED Diagnoses Final diagnoses:  COVID-19  Pneumonia due to infectious organism, unspecified laterality, unspecified part of lung    Rx / DC Orders ED Discharge Orders         Ordered    dicyclomine (BENTYL) 20 MG tablet  2 times daily        10/12/19 1224    promethazine-dextromethorphan (PROMETHAZINE-DM) 6.25-15 MG/5ML syrup  4 times daily PRN        10/12/19 1224    fluticasone (FLONASE) 50 MCG/ACT nasal spray  Daily        10/12/19 1224    benzonatate (TESSALON) 100 MG capsule  Every 8 hours        10/12/19 1224           Solon AugustaFondaw, Jeanelle Dake AlexanderS, [place]PA 10/12/19 1256    Tilden Fossaees, Elizabeth, MD 10/12/19 1347

## 2019-10-12 NOTE — ED Triage Notes (Signed)
Patient Covid positive last Friday. When she takes a deep breath, she feels pressure and chest discomfort. She has anxiety and it makes her scared with Covid. Her doctor told her to come to make sure she does not have pneumonia or if she needs steroids.

## 2019-10-12 NOTE — ED Notes (Signed)
98% RA with ambulation

## 2019-10-12 NOTE — Discharge Instructions (Addendum)
Your chest x-ray looks overall reassuring however there is a small area that could be residual on COVID-19 or could represent a post Covid pneumonia. As we discussed is a young healthy woman you should recover well from this. I will prescribe you some antibiotics to help you recover. Please take the entire course and take the Bentyl for your stomach cramps, Flonase for your congestion and Promethazine DM for cough.  Benzonatate will also help with this.

## 2019-10-24 ENCOUNTER — Other Ambulatory Visit: Payer: Self-pay | Admitting: Family Medicine

## 2019-10-24 DIAGNOSIS — K219 Gastro-esophageal reflux disease without esophagitis: Secondary | ICD-10-CM

## 2019-10-24 MED ORDER — PANTOPRAZOLE SODIUM 40 MG PO TBEC: 40.0000 mg | DELAYED_RELEASE_TABLET | Freq: Every day | ORAL | 0 refills | Status: DC

## 2019-10-24 MED FILL — PANTOPRAZOLE SOD DR 40 MG T: 40 | 30 days supply | Qty: 30 | Fill #0

## 2019-11-01 ENCOUNTER — Ambulatory Visit
Admission: RE | Admit: 2019-11-01 | Discharge: 2019-11-01 | Disposition: A | Discharge status: Home / Self Care | Payer: BC Managed Care – PPO | Source: Ambulatory Visit | Attending: Nurse Practitioner | Admitting: Nurse Practitioner

## 2019-11-01 ENCOUNTER — Other Ambulatory Visit: Payer: Self-pay

## 2019-11-01 ENCOUNTER — Ambulatory Visit (INDEPENDENT_AMBULATORY_CARE_PROVIDER_SITE_OTHER): Payer: BC Managed Care – PPO | Admitting: Nurse Practitioner

## 2019-11-01 VITALS — BP 128/78 | HR 65 | Temp 97.7°F | Ht 71.0 in | Wt 233.0 lb

## 2019-11-01 DIAGNOSIS — G9331 Postviral fatigue syndrome: Secondary | ICD-10-CM | POA: Insufficient documentation

## 2019-11-01 DIAGNOSIS — R0602 Shortness of breath: Secondary | ICD-10-CM | POA: Diagnosis not present

## 2019-11-01 DIAGNOSIS — Z8616 Personal history of COVID-19: Secondary | ICD-10-CM | POA: Diagnosis not present

## 2019-11-01 DIAGNOSIS — G933 Postviral fatigue syndrome: Secondary | ICD-10-CM | POA: Diagnosis not present

## 2019-11-01 HISTORY — DX: Personal history of COVID-19: Z86.16

## 2019-11-01 NOTE — Progress Notes (Signed)
@Patient  ID: , female    DOB: 1990/03/19, 29 y.o.   MRN: [id number]  Chief Complaint  Patient presents with  . New Patient (Initial Visit)    COVID Pos 9/24 Sx; SOB and headaches    Referring provider: 10/24, MD   29 year old female with hypertension, history of asthma, allergic rhinitis, GERD, anemia.  HPI  Patient presents today for post Covid care clinic visit.  Patient was seen in the ED on 10/12/2019.  Chest x-ray revealed right basilar pneumonia.  Patient states that she has been stable since hospital discharge.  She did complete a course of doxycycline and did take Tessalon Perles for cough.  She is trying to stay active.  She does still complain of ongoing shortness of breath with exertion and headaches.  She has not been staying as well-hydrated as she should be. Denies f/c/s, n/v/d, hemoptysis, PND, chest pain or edema.       Allergies  Allergen Reactions  . Diflucan [Fluconazole] Itching and Rash    Immunization History  Administered Date(s) Administered  . Hepatitis A 07/24/2010  . Influenza Split 01/15/2011  . Influenza Whole 10/16/2007  . Meningococcal Conjugate 07/24/2010  . PPD Test 05/10/2012, 05/17/2012  . Tdap 07/24/2010  . Varicella 07/24/2010    Past Medical History:  Diagnosis Date  . Anemia    iron def  . Anxiety   . Asthma   . Hypertension   . Obesity, unspecified   . PCOS (polycystic ovarian syndrome)   . Positive ANA (antinuclear antibody) 05/05/2017   done by [facility] in [facility]; ANA 1:160, speckled pattern    Tobacco History: Social History   Tobacco Use  Smoking Status Never Smoker  Smokeless Tobacco Never Used   Counseling given: Yes   Outpatient Encounter Medications as of 11/01/2019  Medication Sig  . atenolol (TENORMIN) 25 MG tablet Take 1 tablet (25 mg total) by mouth daily. You may take an extra tablet as needed for palpitations. Please keep upcoming appt with Dr. 11/03/2019. Thank you  .  butalbital-acetaminophen-caffeine (FIORICET) 50-325-40 MG tablet Take 1 tablet by mouth every 4 (four) hours as needed for headache.  . lisinopril-hydrochlorothiazide (ZESTORETIC) 20-25 MG tablet Take 1 tablet by mouth daily.  . ondansetron (ZOFRAN) 4 MG tablet Take 1 tablet (4 mg total) by mouth every 8 (eight) hours as needed for nausea or vomiting.  . pantoprazole (PROTONIX) 40 MG tablet Take 1 tablet (40 mg total) by mouth daily. To reduce stomach acid  . benzonatate (TESSALON) 100 MG capsule Take 1 capsule (100 mg total) by mouth every 8 (eight) hours. (Patient not taking: Reported on 11/01/2019)  . dicyclomine (BENTYL) 20 MG tablet Take 1 tablet (20 mg total) by mouth 2 (two) times daily. (Patient not taking: Reported on 11/01/2019)  . fluticasone (FLONASE) 50 MCG/ACT nasal spray Place 2 sprays into both nostrils daily for 14 days.  . promethazine-dextromethorphan (PROMETHAZINE-DM) 6.25-15 MG/5ML syrup Take 5 mLs by mouth 4 (four) times daily as needed for cough. (Patient not taking: Reported on 11/01/2019)   No facility-administered encounter medications on file as of 11/01/2019.     Review of Systems  Review of Systems  Constitutional: Negative.  Negative for fatigue and fever.  HENT: Negative.   Respiratory: Positive for cough and shortness of breath.   Cardiovascular: Negative.  Negative for chest pain, palpitations and leg swelling.  Gastrointestinal: Negative.   Allergic/Immunologic: Negative.   Neurological: Negative.   Psychiatric/Behavioral: Negative.  Physical Exam  BP 128/78   Pulse 65   Temp 97.7 F (36.5 C)   Ht 5\' 11"  (1.803 m)   Wt 233 lb 0.1 oz (105.7 kg)   LMP  (LMP Unknown)   SpO2 98%   BMI 32.50 kg/m   Wt Readings from Last 5 Encounters:  11/01/19 233 lb 0.1 oz (105.7 kg)  10/12/19 241 lb (109.3 kg)  09/14/19 241 lb (109.3 kg)  04/20/19 224 lb (101.6 kg)  04/09/19 223 lb (101.2 kg)     Physical Exam Vitals and nursing note reviewed.   Constitutional:      General: She is not in acute distress.    Appearance: She is well-developed.  Cardiovascular:     Rate and Rhythm: Normal rate and regular rhythm.  Pulmonary:     Effort: Pulmonary effort is normal.     Breath sounds: Normal breath sounds.  Musculoskeletal:     Right lower leg: No edema.     Left lower leg: No edema.  Neurological:     Mental Status: She is alert and oriented to person, place, and time.  Psychiatric:        Mood and Affect: Mood normal.        Behavior: Behavior normal.       Imaging: DG Chest Portable 1 View  Result Date: 10/12/2019 CLINICAL DATA:  Cough, COVID-19 EXAM: PORTABLE CHEST 1 VIEW COMPARISON:  01/14/2019 FINDINGS: The heart size and mediastinal contours are within normal limits. Patchy right basilar airspace opacity. Left lung appears clear. No pleural effusion or pneumothorax. The visualized skeletal structures are unremarkable. IMPRESSION: Patchy right basilar airspace opacity suspicious for pneumonia. Electronically Signed   By: 03/14/2019 D.O.   On: 10/12/2019 12:50     Assessment & Plan:   History of COVID-19 Cough:   Stay well hydrated  Stay active  Deep breathing exercises  May start vitamin C 2,000 mg daily, vitamin D3 2,000 IU daily, Zinc 220 mg daily, and Magnesium 600 mg for headache  May take tylenol or fever or pain  May take mucinex DM twice daily  Will order chest x ray  Will order labs  Note: Patient was walked in office today.  O2 sat stayed above 95% for entire walk and heart rate was stable.   Follow up:  Follow up in 4 weeks or sooner if needed      12/12/2019, NP 11/01/2019

## 2019-11-01 NOTE — Patient Instructions (Addendum)
Covid 19 Cough:   Stay well hydrated  Stay active  Deep breathing exercises  May start vitamin C 2,000 mg daily, vitamin D3 2,000 IU daily, Zinc 220 mg daily, and Magnesium 600 mg for headache  May take tylenol or fever or pain  May take mucinex DM twice daily  Will order chest x ray  Will order labs  Note: Patient was walked in office today.  O2 sat stayed above 95% for entire walk and heart rate was stable.   Follow up:  Follow up in 4 weeks or sooner if needed

## 2019-11-01 NOTE — Assessment & Plan Note (Signed)
Cough:   Stay well hydrated  Stay active  Deep breathing exercises  May start vitamin C 2,000 mg daily, vitamin D3 2,000 IU daily, Zinc 220 mg daily, and Magnesium 600 mg for headache  May take tylenol or fever or pain  May take mucinex DM twice daily  Will order chest x ray  Will order labs  Note: Patient was walked in office today.  O2 sat stayed above 95% for entire walk and heart rate was stable.   Follow up:  Follow up in 4 weeks or sooner if needed

## 2019-11-02 LAB — CMP14+LP+2AC+CBC/D/PLT
ALT: 22 IU/L (ref 0–32)
AST: 20 IU/L (ref 0–40)
Albumin: 4.9 g/dL (ref 3.9–5.0)
Alkaline Phosphatase: 55 IU/L (ref 44–121)
BUN/Creatinine Ratio: 16 (ref 9–23)
BUN: 13 mg/dL (ref 6–20)
Basophils Absolute: 0 10*3/uL (ref 0.0–0.2)
Basos: 1 %
Bilirubin Total: 0.7 mg/dL (ref 0.0–1.2)
Bilirubin, Direct: 0.16 mg/dL (ref 0.00–0.40)
CO2: 22 mmol/L (ref 20–29)
Calcium: 9.8 mg/dL (ref 8.7–10.2)
Chloride: 101 mmol/L (ref 96–106)
Chol/HDL Ratio: 4 ratio (ref 0.0–4.4)
Cholesterol, Total: 183 mg/dL (ref 100–199)
Creatinine, Ser: 0.8 mg/dL (ref 0.57–1.00)
EOS (ABSOLUTE): 0 10*3/uL (ref 0.0–0.4)
Eos: 1 %
GFR calc Af Amer: 117 mL/min/{1.73_m2} (ref >59)
GFR calc non Af Amer: 101 mL/min/{1.73_m2} (ref >59)
GGT: 59 IU/L (ref 0–60)
Glucose: 85 mg/dL (ref 65–99)
HDL: 46 mg/dL (ref >39)
Hematocrit: 37.6 % (ref 34.0–46.6)
Hemoglobin: 12.2 g/dL (ref 11.1–15.9)
Immature Grans (Abs): 0 10*3/uL (ref 0.0–0.1)
Immature Granulocytes: 0 %
LDL Chol Calc (NIH): 117 mg/dL — ABNORMAL HIGH (ref 0–99)
Lymphocytes Absolute: 1.6 10*3/uL (ref 0.7–3.1)
Lymphs: 43 %
MCH: 26.9 pg (ref 26.6–33.0)
MCHC: 32.4 g/dL (ref 31.5–35.7)
MCV: 83 fL (ref 79–97)
Monocytes Absolute: 0.3 10*3/uL (ref 0.1–0.9)
Monocytes: 9 %
Neutrophils Absolute: 1.7 10*3/uL (ref 1.4–7.0)
Neutrophils: 46 %
Platelets: 314 10*3/uL (ref 150–450)
Potassium: 3.9 mmol/L (ref 3.5–5.2)
RBC: 4.54 x10E6/uL (ref 3.77–5.28)
RDW: 14.8 % (ref 11.7–15.4)
Sodium: 139 mmol/L (ref 134–144)
Total Protein: 7.8 g/dL (ref 6.0–8.5)
Triglycerides: 112 mg/dL (ref 0–149)
VLDL Cholesterol Cal: 20 mg/dL (ref 5–40)
WBC: 3.8 10*3/uL (ref 3.4–10.8)

## 2019-11-02 LAB — COMPREHENSIVE METABOLIC PANEL
Albumin/Globulin Ratio: 1.7 (ref 1.2–2.2)
Globulin, Total: 2.9 g/dL (ref 1.5–4.5)

## 2019-11-12 ENCOUNTER — Telehealth: Payer: Self-pay | Admitting: Family Medicine

## 2019-11-12 NOTE — Telephone Encounter (Signed)
Patient is calling to ask advice on how did the provider what to proceed with her neuponia. She was advised that it is getting smaller. But is there anything else that needs to be done.  Patient states that it is ok to communicate through Mychart. Cb- [phone]

## 2019-11-12 NOTE — Telephone Encounter (Signed)
Patient notified

## 2019-11-12 NOTE — Telephone Encounter (Signed)
Please call to let patient know that she just needs to continue deep breathing exercises. Her chest xray does show improvement. It could take up to 12 weeks to completely clear. It looks like she already has a follow up appointment scheduled here in a couple of weeks. Thanks.

## 2019-11-13 ENCOUNTER — Encounter: Payer: Self-pay | Admitting: Family Medicine

## 2019-11-29 ENCOUNTER — Ambulatory Visit (INDEPENDENT_AMBULATORY_CARE_PROVIDER_SITE_OTHER): Payer: BC Managed Care – PPO | Admitting: Nurse Practitioner

## 2019-11-29 ENCOUNTER — Ambulatory Visit
Admission: RE | Admit: 2019-11-29 | Discharge: 2019-11-29 | Disposition: A | Discharge status: Home / Self Care | Payer: BC Managed Care – PPO | Source: Ambulatory Visit | Attending: Nurse Practitioner | Admitting: Nurse Practitioner

## 2019-11-29 VITALS — BP 126/82 | HR 60 | Temp 97.3°F | Ht 71.0 in | Wt 236.0 lb

## 2019-11-29 DIAGNOSIS — Z8616 Personal history of COVID-19: Secondary | ICD-10-CM | POA: Diagnosis not present

## 2019-11-29 DIAGNOSIS — U071 COVID-19: Secondary | ICD-10-CM

## 2019-11-29 DIAGNOSIS — J1282 Pneumonia due to coronavirus disease 2019: Secondary | ICD-10-CM | POA: Diagnosis not present

## 2019-11-29 MED ORDER — PREDNISONE 10 MG PO TABS: ORAL_TABLET | ORAL | 0 refills | Status: DC

## 2019-11-29 NOTE — Patient Instructions (Signed)
Covid 19 Cough:   Stay well hydrated  Stay active  Deep breathing exercises  May take tylenol for fever or pain  May take Delsym twice daily  Will order chest x ray  Will order prednisone   Follow up:  Follow up if needed

## 2019-11-29 NOTE — Assessment & Plan Note (Signed)
Cough:   Stay well hydrated  Stay active  Deep breathing exercises  May take tylenol for fever or pain  May take Delsym twice daily  Will order chest x ray  Will order prednisone   Follow up:  Follow up if needed

## 2019-11-29 NOTE — Progress Notes (Signed)
@Patient  ID: , female    DOB: [birth year], 29 y.o.   MRN: [id number]  Chief Complaint  Patient presents with  . Follow-up    Past 2 days, feels heavyness of chest more SOB stats are fine when checking at home    Referring provider: [id number], MD   29 year old female with hypertension, history of asthma, allergic rhinitis, GERD, anemia.  HPI  Patient presents today for post Covid care clinic visit follow up.  Patient was seen in the ED on 10/12/2019. Chest x-ray revealed right basilar pneumonia.  Patient was last seen at this office on 11/01/2019.  Her chest x-ray at that visit did show improving pneumonia.  She states that she is still having shortness of breath and over the past couple weeks has had increased cough.  She denies any recent fever. Denies f/c/s, n/v/d, hemoptysis, PND, chest pain or edema.      Allergies  Allergen Reactions  . Diflucan [Fluconazole] Itching and Rash    Immunization History  Administered Date(s) Administered  . Hepatitis A 07/24/2010  . Influenza Split 01/15/2011  . Influenza Whole 10/16/2007  . Meningococcal Conjugate 07/24/2010  . PPD Test 05/10/2012, 05/17/2012  . Tdap 07/24/2010  . Varicella 07/24/2010    Past Medical History:  Diagnosis Date  . Anemia    iron def  . Anxiety   . Asthma   . Hypertension   . Obesity, unspecified   . PCOS (polycystic ovarian syndrome)   . Positive ANA (antinuclear antibody) 05/05/2017   done by [facility] in [facility]; ANA 1:160, speckled pattern    Tobacco History: Social History   Tobacco Use  Smoking Status Never Smoker  Smokeless Tobacco Never Used   Counseling given: Not Answered   Outpatient Encounter Medications as of 11/29/2019  Medication Sig  . atenolol (TENORMIN) 25 MG tablet Take 1 tablet (25 mg total) by mouth daily. You may take an extra tablet as needed for palpitations. Please keep upcoming appt with Dr. 12/01/2019. Thank you  .  lisinopril-hydrochlorothiazide (ZESTORETIC) 20-25 MG tablet Take 1 tablet by mouth daily.  . ondansetron (ZOFRAN) 4 MG tablet Take 1 tablet (4 mg total) by mouth every 8 (eight) hours as needed for nausea or vomiting.  . pantoprazole (PROTONIX) 40 MG tablet Take 1 tablet (40 mg total) by mouth daily. To reduce stomach acid  . benzonatate (TESSALON) 100 MG capsule Take 1 capsule (100 mg total) by mouth every 8 (eight) hours. (Patient not taking: Reported on 11/01/2019)  . butalbital-acetaminophen-caffeine (FIORICET) 50-325-40 MG tablet Take 1 tablet by mouth every 4 (four) hours as needed for headache. (Patient not taking: Reported on 11/29/2019)  . dicyclomine (BENTYL) 20 MG tablet Take 1 tablet (20 mg total) by mouth 2 (two) times daily. (Patient not taking: Reported on 11/01/2019)  . fluticasone (FLONASE) 50 MCG/ACT nasal spray Place 2 sprays into both nostrils daily for 14 days.  . predniSONE (DELTASONE) 10 MG tablet Take 4 tabs for 2 days, then 3 tabs for 2 days, then 2 tabs for 2 days, then 1 tab for 2 days, then stop  . promethazine-dextromethorphan (PROMETHAZINE-DM) 6.25-15 MG/5ML syrup Take 5 mLs by mouth 4 (four) times daily as needed for cough. (Patient not taking: Reported on 11/01/2019)   No facility-administered encounter medications on file as of 11/29/2019.     Review of Systems  Review of Systems  Constitutional: Negative.  Negative for fatigue and fever.  HENT: Negative.   Respiratory: Positive for cough and  shortness of breath.   Cardiovascular: Negative.  Negative for chest pain, palpitations and leg swelling.  Gastrointestinal: Negative.   Allergic/Immunologic: Negative.   Neurological: Negative.   Psychiatric/Behavioral: Negative.        Physical Exam  BP 126/82 (BP Location: Left Arm)   Pulse 60   Temp (!) 97.3 F (36.3 C)   Ht 5\' 11"  (1.803 m)   Wt 236 lb (107 kg)   SpO2 98%   BMI 32.92 kg/m   Wt Readings from Last 5 Encounters:  11/29/19 236 lb (107  kg)  11/01/19 233 lb 0.1 oz (105.7 kg)  10/12/19 241 lb (109.3 kg)  09/14/19 241 lb (109.3 kg)  04/20/19 224 lb (101.6 kg)     Physical Exam Vitals and nursing note reviewed.  Constitutional:      General: She is not in acute distress.    Appearance: She is well-developed.  Cardiovascular:     Rate and Rhythm: Normal rate and regular rhythm.  Pulmonary:     Effort: Pulmonary effort is normal.     Breath sounds: Normal breath sounds.  Musculoskeletal:     Right lower leg: No edema.     Left lower leg: No edema.  Neurological:     Mental Status: She is alert and oriented to person, place, and time.  Psychiatric:        Mood and Affect: Mood normal.        Behavior: Behavior normal.       Imaging: DG Chest 2 View  Result Date: 11/02/2019 CLINICAL DATA:  Shortness of breath, COVID-19 positive. EXAM: CHEST - 2 VIEW COMPARISON:  October 12, 2019. FINDINGS: The heart size and mediastinal contours are within normal limits. No pneumothorax or pleural effusion is noted. The left lung is clear. Mildly decreased right basilar opacity is noted suggesting improving right basilar pneumonia. The visualized skeletal structures are unremarkable. IMPRESSION: Improving right basilar pneumonia. Electronically Signed   By: October 14, 2019 M.D.   On: 11/02/2019 16:49     Assessment & Plan:   History of COVID-19 Cough:   Stay well hydrated  Stay active  Deep breathing exercises  May take tylenol for fever or pain  May take Delsym twice daily  Will order chest x ray  Will order prednisone   Follow up:  Follow up if needed       11/04/2019, NP 11/29/2019

## 2019-12-01 ENCOUNTER — Other Ambulatory Visit: Payer: Self-pay | Admitting: Family Medicine

## 2019-12-01 DIAGNOSIS — K219 Gastro-esophageal reflux disease without esophagitis: Secondary | ICD-10-CM

## 2019-12-02 MED ORDER — PANTOPRAZOLE SODIUM 40 MG PO TBEC: 40.0000 mg | DELAYED_RELEASE_TABLET | Freq: Every day | ORAL | 0 refills | Status: DC

## 2019-12-03 ENCOUNTER — Telehealth: Payer: Self-pay | Admitting: Nurse Practitioner

## 2019-12-03 NOTE — Telephone Encounter (Signed)
Patient notified of results, patient questioned ifshe should start prednisone. Advised patient to start taking to help clear out lungs completley. Verbally understood. No additional questions.

## 2019-12-03 NOTE — Telephone Encounter (Signed)
-----   Message from Ivonne Andrew, NP sent at 12/03/2019  8:47 AM EST ----- Please call to let patient know that her chest xray is almost completely clear.

## 2019-12-11 ENCOUNTER — Encounter: Payer: Self-pay | Admitting: Family Medicine

## 2019-12-11 ENCOUNTER — Other Ambulatory Visit: Payer: Self-pay | Admitting: Critical Care Medicine

## 2019-12-11 DIAGNOSIS — K219 Gastro-esophageal reflux disease without esophagitis: Secondary | ICD-10-CM

## 2019-12-11 MED ORDER — PANTOPRAZOLE SODIUM 40 MG PO TBEC: 40.0000 mg | DELAYED_RELEASE_TABLET | Freq: Every day | ORAL | 0 refills | Status: DC

## 2019-12-11 MED FILL — PANTOPRAZOLE SOD DR 40 MG T: 40 | 30 days supply | Qty: 30 | Fill #0

## 2019-12-11 NOTE — Addendum Note (Signed)
Addended by: Storm Frisk on: 12/11/2019 09:56 AM   Modules accepted: Orders

## 2019-12-17 ENCOUNTER — Other Ambulatory Visit: Payer: Self-pay

## 2019-12-17 MED ORDER — ATENOLOL 25 MG PO TABS
25.0000 mg | ORAL_TABLET | Freq: Every day | ORAL | 2 refills | Status: DC
Start: 2019-12-17 — End: 2022-07-18

## 2019-12-17 NOTE — Telephone Encounter (Signed)
Pt's medication was sent to pt's pharmacy as requested. Confirmation received.  °

## 2020-01-09 ENCOUNTER — Ambulatory Visit (INDEPENDENT_AMBULATORY_CARE_PROVIDER_SITE_OTHER): Payer: BC Managed Care – PPO | Admitting: Cardiology

## 2020-01-09 ENCOUNTER — Encounter: Payer: Self-pay | Admitting: Cardiology

## 2020-01-09 ENCOUNTER — Other Ambulatory Visit: Payer: Self-pay

## 2020-01-09 VITALS — BP 118/70 | HR 71 | Ht 71.0 in | Wt 242.4 lb

## 2020-01-09 DIAGNOSIS — I493 Ventricular premature depolarization: Secondary | ICD-10-CM | POA: Diagnosis not present

## 2020-01-09 DIAGNOSIS — R079 Chest pain, unspecified: Secondary | ICD-10-CM | POA: Diagnosis not present

## 2020-01-09 DIAGNOSIS — I10 Essential (primary) hypertension: Secondary | ICD-10-CM

## 2020-01-09 LAB — TROPONIN T: Troponin T (Highly Sensitive): <6 ng/L (ref 0–14)

## 2020-01-09 NOTE — Progress Notes (Signed)
Cardiology Office Note    Date:  01/09/2020   ID:  Diane Lloyd, DOB Mar 05, 1990, MRN [id number]  PCP:  Cain Saupe, MD (Inactive)  Cardiologist: Armanda Magic, MD EPS: None  Chief Complaint  Patient presents with  . Follow-up    PVCs and HTN    History of Present Illness:  Diane Lloyd is a 29 y.o. female with history of HTN, obesity, asthma, PCOS, and chronic anemia. She was seen in the emergency room on 03/01/2018 with complaints of intermittent chest pain and palpitations. She has been to the ER several times with negative work-up.  In review she has had ER visits since December for chest pain as well as headaches.    She has had an extensive cardiac workup with coronary CTA showing no CAD and calcium score of 0, normal cardiac MRI, normal echo and rare PVCs on heart monitor.    She is back today because she is very worried about having COVID 19 complicated by PNA in Sept and recently started having chest pain for the past 10 days.  She describes it as a dull ache in the left upper chest and into her back and sometimes in her right side.  Sometimes it is worse with deep breathing.  Pain is worse when she sits up and better when lying down. She has been to the Urgent Care and EKGs were normal.  She has some occasional DOE since having COVID 19.  She denies any selling in her legs. Her palpitations are much improved.    Past Medical History:  Diagnosis Date  . Anemia    iron def  . Anxiety   . Asthma   . Hypertension   . Obesity, unspecified   . PCOS (polycystic ovarian syndrome)   . Positive ANA (antinuclear antibody) 05/05/2017   done by [facility] in [facility]; ANA 1:160, speckled pattern    Past Surgical History:  Procedure Laterality Date  . TONSILLECTOMY      Current Medications: Current Meds  Medication Sig  . atenolol (TENORMIN) 25 MG tablet Take 1 tablet (25 mg total) by mouth daily. You may take an extra tablet as needed for palpitations.  Marland Kitchen  lisinopril-hydrochlorothiazide (ZESTORETIC) 20-25 MG tablet Take 1 tablet by mouth daily.  . pantoprazole (PROTONIX) 40 MG tablet Take 1 tablet (40 mg total) by mouth daily. To reduce stomach acid     Allergies:   Diflucan [fluconazole]   Social History   Socioeconomic History  . Marital status: Single    Spouse name: Not on file  . Number of children: 0  . Years of education: Not on file  . Highest education level: Some college, no degree  Occupational History  . Occupation: CSR    Employer: [employer]  Tobacco Use  . Smoking status: Never Smoker  . Smokeless tobacco: Never Used  Vaping Use  . Vaping Use: Never used  Substance and Sexual Activity  . Alcohol use: Yes    Comment: occasional  . Drug use: Not Currently  . Sexual activity: Not Currently    Birth control/protection: None  Other Topics Concern  . Not on file  Social History Narrative   Patient is right-handed. She lives alone. She occasionally drinks caffeine. She does not exercise.   Social Determinants of Health   Financial Resource Strain: Not on file  Food Insecurity: Not on file  Transportation Needs: Not on file  Physical Activity: Not on file  Stress: Not on file  Social  Connections: Not on file     Family History:  The patient's   family history includes Anxiety disorder in her cousin and paternal aunt; Asthma in her father; Cancer (age of onset: 42) in her maternal uncle; Cancer (age of onset: 28) in her father; Cancer (age of onset: 61) in her paternal grandmother; Depression in her paternal aunt; Diabetes in her mother; Esophageal cancer in her father; Hypertension in her mother; Lupus in her mother; Migraines in her mother.   ROS:   Please see the history of present illness.    ROS All other systems reviewed and are negative.   PHYSICAL EXAM:   VS:  BP 118/70   Pulse 71   Ht 5\' 11"  (1.803 m)   Wt 242 lb 6.4 oz (110 kg)   SpO2 98%   BMI 33.81 kg/m   Physical Exam  GEN: Well nourished,  well developed in no acute distress HEENT: Normal NECK: No JVD; No carotid bruits LYMPHATICS: No lymphadenopathy CARDIAC:RRR, no murmurs, rubs, gallops RESPIRATORY:  Clear to auscultation without rales, wheezing or rhonchi  ABDOMEN: Soft, non-tender, non-distended MUSCULOSKELETAL:  No edema; No deformity  SKIN: Warm and dry NEUROLOGIC:  Alert and oriented x 3 PSYCHIATRIC:  Normal affect    Wt Readings from Last 3 Encounters:  01/09/20 242 lb 6.4 oz (110 kg)  11/29/19 236 lb (107 kg)  11/01/19 233 lb 0.1 oz (105.7 kg)      Studies/Labs Reviewed:   EKG:  EKG is not ordered today   Recent Labs: 04/05/2019: TSH 0.927 11/01/2019: ALT 22; BUN 13; Creatinine, Ser 0.80; Hemoglobin 12.2; Platelets 314; Potassium 3.9; Sodium 139   Lipid Panel    Component Value Date/Time   CHOL 183 11/01/2019 1015   TRIG 112 11/01/2019 1015   HDL 46 11/01/2019 1015   CHOLHDL 4.0 11/01/2019 1015   CHOLHDL 3.8 12/12/2015 1441   VLDL 32 (H) 12/12/2015 1441   LDLCALC 117 (H) 11/01/2019 1015    Additional studies/ records that were reviewed today include:   2D echo 05/17/18      1. The left ventricle has normal systolic function with an ejection fraction of 60-65%. The cavity size was normal. Left ventricular diastolic parameters were normal. No evidence of left ventricular regional wall motion abnormalities. GLS normal,  -22.5%.  2. The right ventricle has normal systolic function. The cavity was normal. There is no increase in right ventricular wall thickness.  3. The aortic valve is tricuspid. No stenosis of the aortic valve.  4. The aortic root is normal in size and structure.  5. No evidence of mitral valve stenosis. No mitral regurgitation.  6. The IVC was normal in size. No complete TR doppler jet so unable to estimate PA systolic pressure.   FINDINGS  Left Ventricle: The left ventricle has normal systolic function, with an ejection fraction of 60-65%. The cavity size was normal. There is no  increase in left ventricular wall thickness. Left ventricular diastolic parameters were normal. No evidence of  left ventricular regional wall motion abnormalities..   Right Ventricle: The right ventricle has normal systolic function. The cavity was normal. There is no increase in right ventricular wall thickness.   Left Atrium: Left atrial size was normal in size.   Right Atrium: Right atrial size was normal in size.   Interatrial Septum: No atrial level shunt detected by color flow Doppler.   Pericardium: There is no evidence of pericardial effusion.   Mitral Valve: The mitral valve is normal  in structure. Mitral valve regurgitation is not visualized by color flow Doppler. No evidence of mitral valve stenosis.   Tricuspid Valve: The tricuspid valve is normal in structure. Tricuspid valve regurgitation was not visualized by color flow Doppler.   Aortic Valve: The aortic valve is tricuspid Aortic valve regurgitation was not visualized by color flow Doppler. There is No stenosis of the aortic valve.   Pulmonic Valve: The pulmonic valve was normal in structure. Pulmonic valve regurgitation is not visualized by color flow Doppler.   Aorta: The aortic root is normal in size and structure.   Venous: The inferior vena cava is normal in size with greater than 50% respiratory variability.      holter monitor 06/2018 Sinus bradycardia, normal sinus rhythm and sinus tachycardia. The average heart rate was 77bpm. The heart rate ranged from 45 to 178bpm.  Rare PVCs   ETT 10/2018 Study Highlights    Blood pressure demonstrated a normal response to exercise.  There was no ST segment deviation noted during stress.   ETT with mildly impaired exercise tolerance (5:00); no chest pain; normal blood pressure response; no ST changes; negative inadequate exercise treadmill (patient achieved 83% target heart rate); Duke treadmill score 5.   Coronary CTA 01/2019 IMPRESSION: 1. Coronary calcium  score of 0.  2. Normal coronary origin with right dominance.  3. No evidence of CAD.  4. CAD-RADS 0. No evidence of CAD (0%). Consider non-atherosclerotic causes of chest pain.  Cardiac MRI 12/2018 IMPRESSION: 1. Normal left ventricular size, with mild concentric hypertrophy and normal systolic function (LVEF = 66%). There are no regional wall motion abnormalities and no late gadolinium enhancement in the left ventricular myocardium.  2. Normal right ventricular size, thickness and systolic function (LVEF = 61%). There are no regional wall motion abnormalities.  3.  Normal left and right atrial size.  4. Normal size of the aortic root, ascending aorta and pulmonary artery.  5.  No significant valvular abnormalities.  6.  Normal pericardium. Trivial pericardial effusion.  7. This is a normal cardiac MRI, Left main has normal origin, RCA is not visualized on this study, if clinical suspicion for ischemia is high, a coronary CTA is recommended.    ASSESSMENT:    1. PVC (premature ventricular contraction)   2. Chest pain, unspecified type   3. Essential hypertension     PLAN:  In order of problems listed above:  1.  PVCs -PVCs have significantly improved since I saw her last -continue Atenolol 25mg  daily -coronary CTA with no CAD and cardiac MRI normal  2.  Chest pain  -no CAD and no coronary CA on CTA -her CP started about 10 days but she was concerned that it may have been a sequela of COVID 19 infection in Sept -I will check a sed rate, hs Trop and CRP as well as a 2D echo to assess for pericardial fluid  3.  Essential HTN  -BP controlled -continue Atenolol 25mg  daily and Lisinopril-HCT 20-25mg  daily   Medication Adjustments/Labs and Tests Ordered: Current medicines are reviewed at length with the patient today.  Concerns regarding medicines are outlined above.  Medication changes, Labs and Tests ordered today are listed in the Patient Instructions  below. There are no Patient Instructions on file for this visit.   Signed, 10-30-1974, MD  01/09/2020 11:35 AM    [facility] [address] [place], [place], [place]  [place] Phone: [phone]; Fax: [phone]

## 2020-01-09 NOTE — Patient Instructions (Signed)
Medication Instructions:  Your physician recommends that you continue on your current medications as directed. Please refer to the Current Medication list given to you today.  *If you need a refill on your cardiac medications before your next appointment, please call your pharmacy*   Lab Work: TODAY: Troponin, CRP, ESR If you have labs (blood work) drawn today and your tests are completely normal, you will receive your results only by: Marland Kitchen MyChart Message (if you have MyChart) OR . A paper copy in the mail If you have any lab test that is abnormal or we need to change your treatment, we will call you to review the results.   Testing/Procedures: Your physician has requested that you have an echocardiogram. Echocardiography is a painless test that uses sound waves to create images of your heart. It provides your doctor with information about the size and shape of your heart and how well your heart's chambers and valves are working. This procedure takes approximately one hour. There are no restrictions for this procedure.   Follow-Up: At [facility], you and your health needs are our priority.  As part of our continuing mission to provide you with exceptional heart care, we have created designated Provider Care Teams.  These Care Teams include your primary Cardiologist (physician) and Advanced Practice Providers (APPs -  Physician Assistants and Nurse Practitioners) who all work together to provide you with the care you need, when you need it.  Your next appointment:   1 year(s)  The format for your next appointment:   In Person  Provider:   You may see Fransico Him, MD or one of the following Advanced Practice Providers on your designated Care Team:    Melina Copa, PA-C  Ermalinda Barrios, PA-C

## 2020-01-09 NOTE — Addendum Note (Signed)
Addended by: Theresia Majors on: 01/09/2020 11:47 AM   Modules accepted: Orders

## 2020-01-10 LAB — HIGH SENSITIVITY CRP: CRP, High Sensitivity: 7.27 mg/L — ABNORMAL HIGH (ref 0.00–3.00)

## 2020-01-10 LAB — SEDIMENTATION RATE: Sed Rate: 26 mm/hr (ref 0–32)

## 2020-02-04 ENCOUNTER — Other Ambulatory Visit (HOSPITAL_COMMUNITY): Payer: BC Managed Care – PPO

## 2020-03-30 ENCOUNTER — Other Ambulatory Visit: Payer: Self-pay | Admitting: Cardiology

## 2020-04-07 ENCOUNTER — Other Ambulatory Visit (HOSPITAL_COMMUNITY): Payer: Medicaid Other

## 2020-04-07 ENCOUNTER — Encounter (HOSPITAL_COMMUNITY): Payer: Self-pay | Admitting: Cardiology

## 2020-04-21 ENCOUNTER — Telehealth (HOSPITAL_COMMUNITY): Payer: Self-pay | Admitting: Cardiology

## 2020-04-21 NOTE — Telephone Encounter (Signed)
Just an FYI. We have made several attempts to contact this patient including sending a letter to schedule or reschedule their echocardiogram. We will be removing the patient from the echo WQ.   04/07/20 NO SHOWED- MAILED LETTER-LBW     Thank you 

## 2020-06-06 IMAGING — DX DG CHEST 2V
2 series · 2 of 2 positions shown · non-contrast
Comparison: 12/20/2017

CLINICAL DATA: Chest pain

EXAM:
CHEST - 2 VIEW

[chest pa]
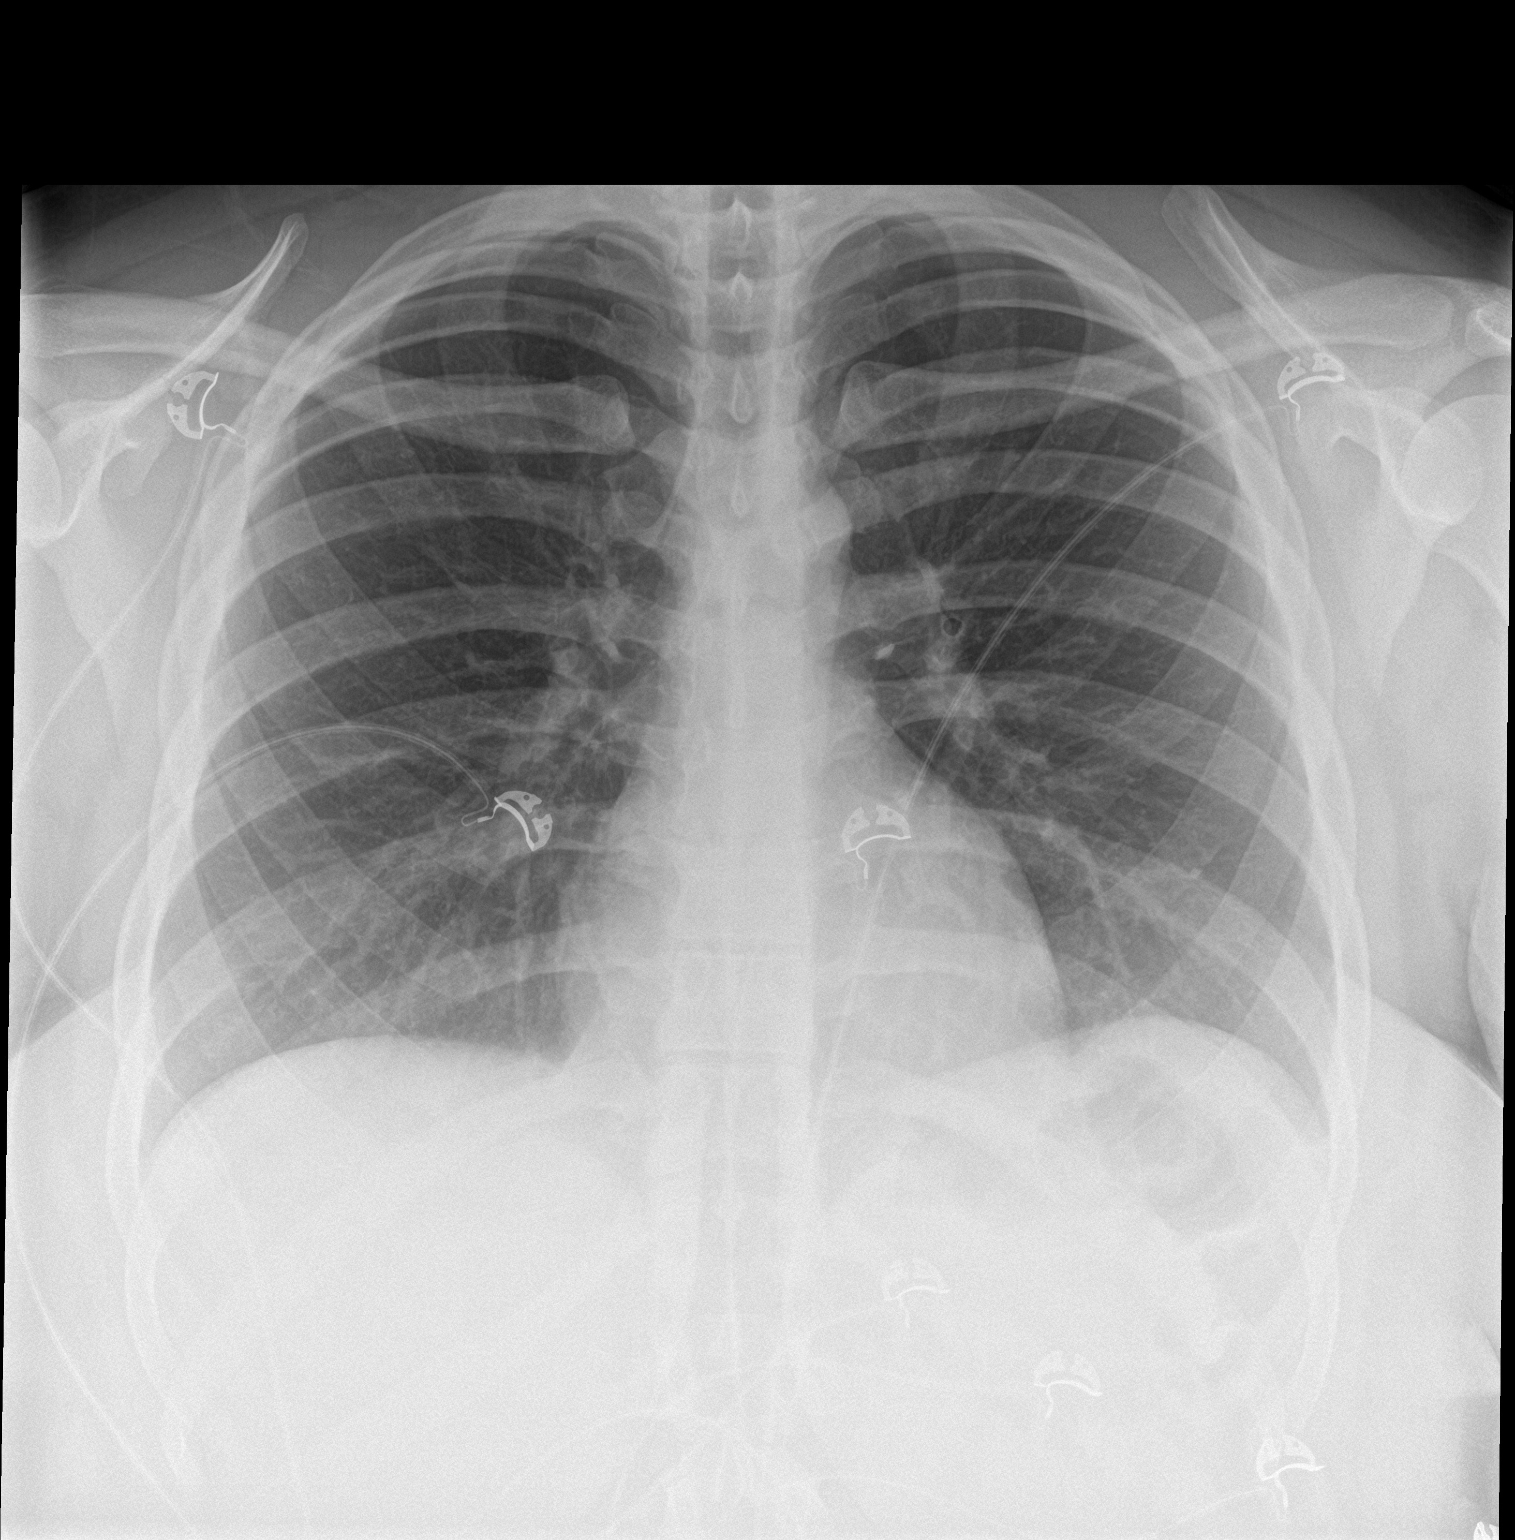

[chest lat]
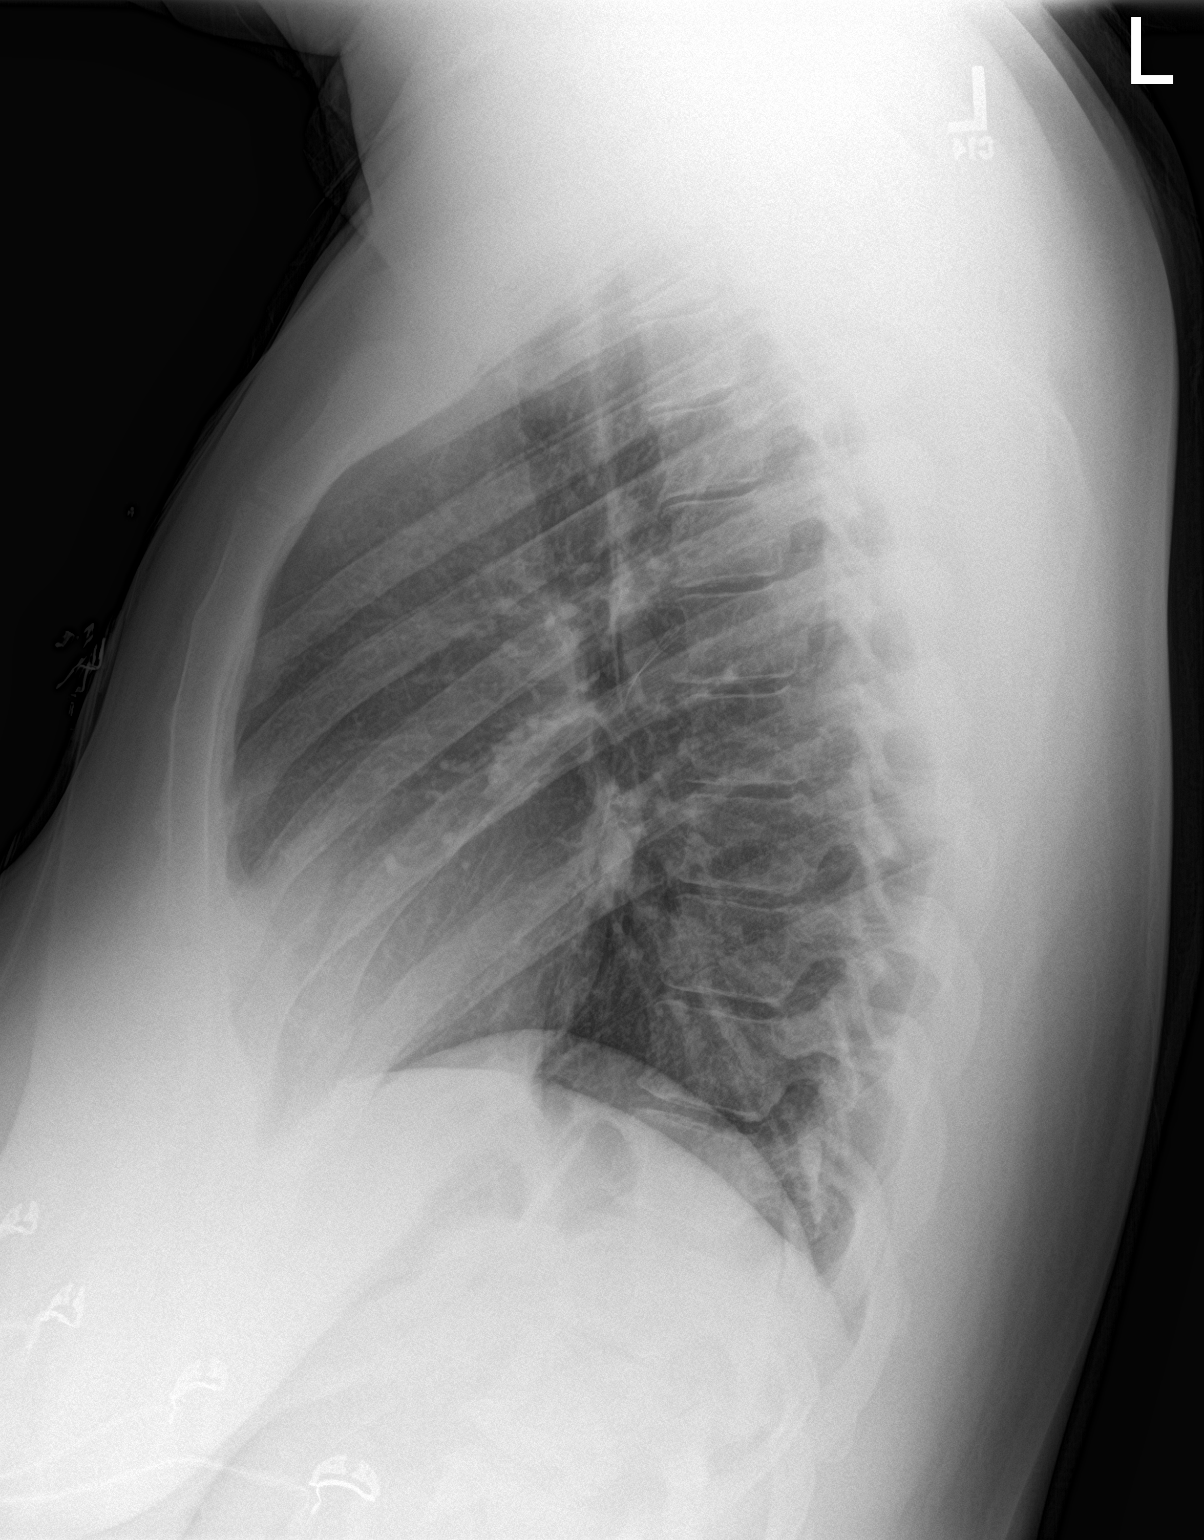

[2 of 2 positions shown; findings below may reference images not displayed]

FINDINGS: The heart size and mediastinal contours are within normal limits.
Both lungs are clear. The visualized skeletal structures are
unremarkable.
IMPRESSION: No active cardiopulmonary disease.

## 2020-09-16 IMAGING — CR CHEST - 2 VIEW
2 series · 2 of 2 positions shown · non-contrast
Comparison: 04/28/2018

CLINICAL DATA: Chest pain x1 day

EXAM:
CHEST - 2 VIEW

[w chest pa]
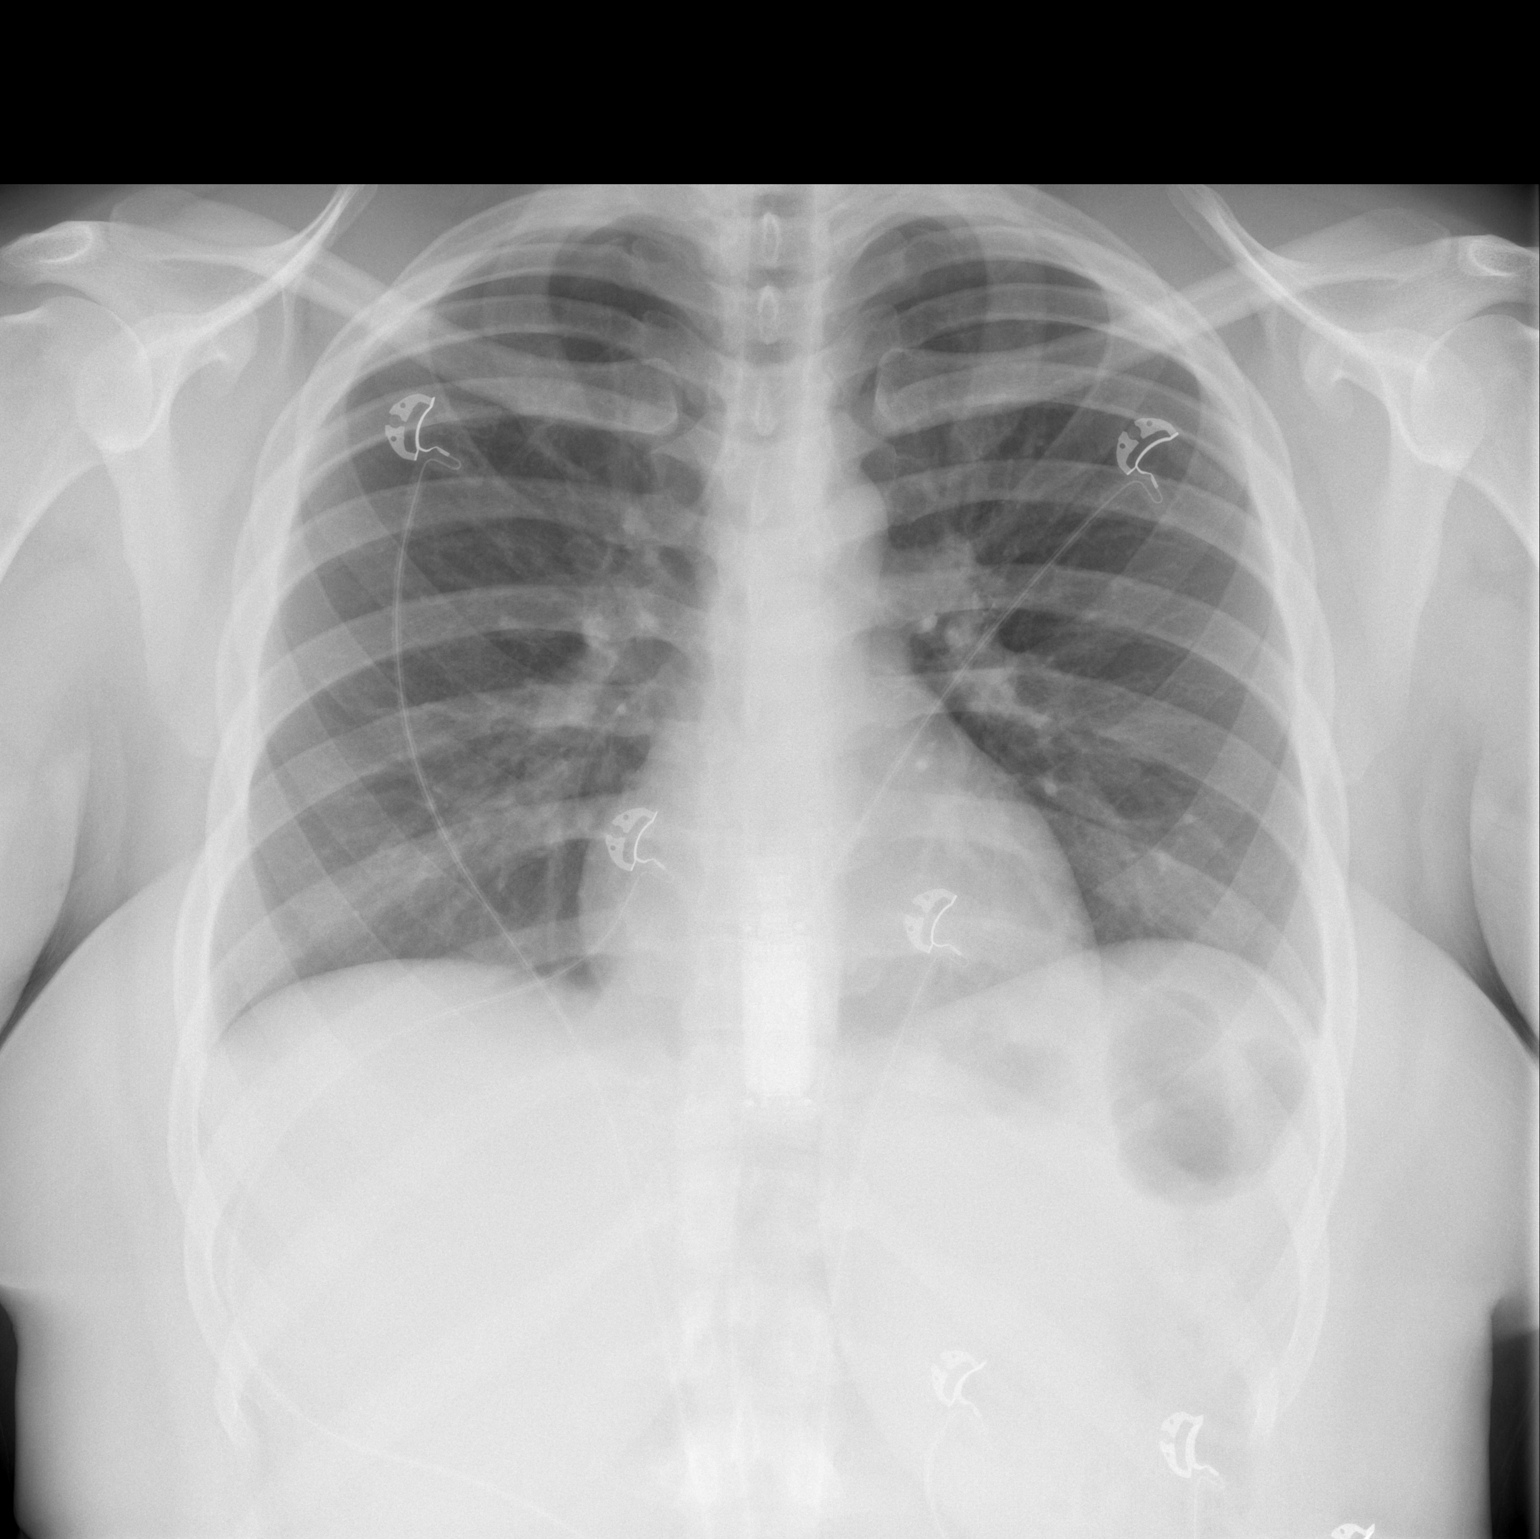

[w chest lat]
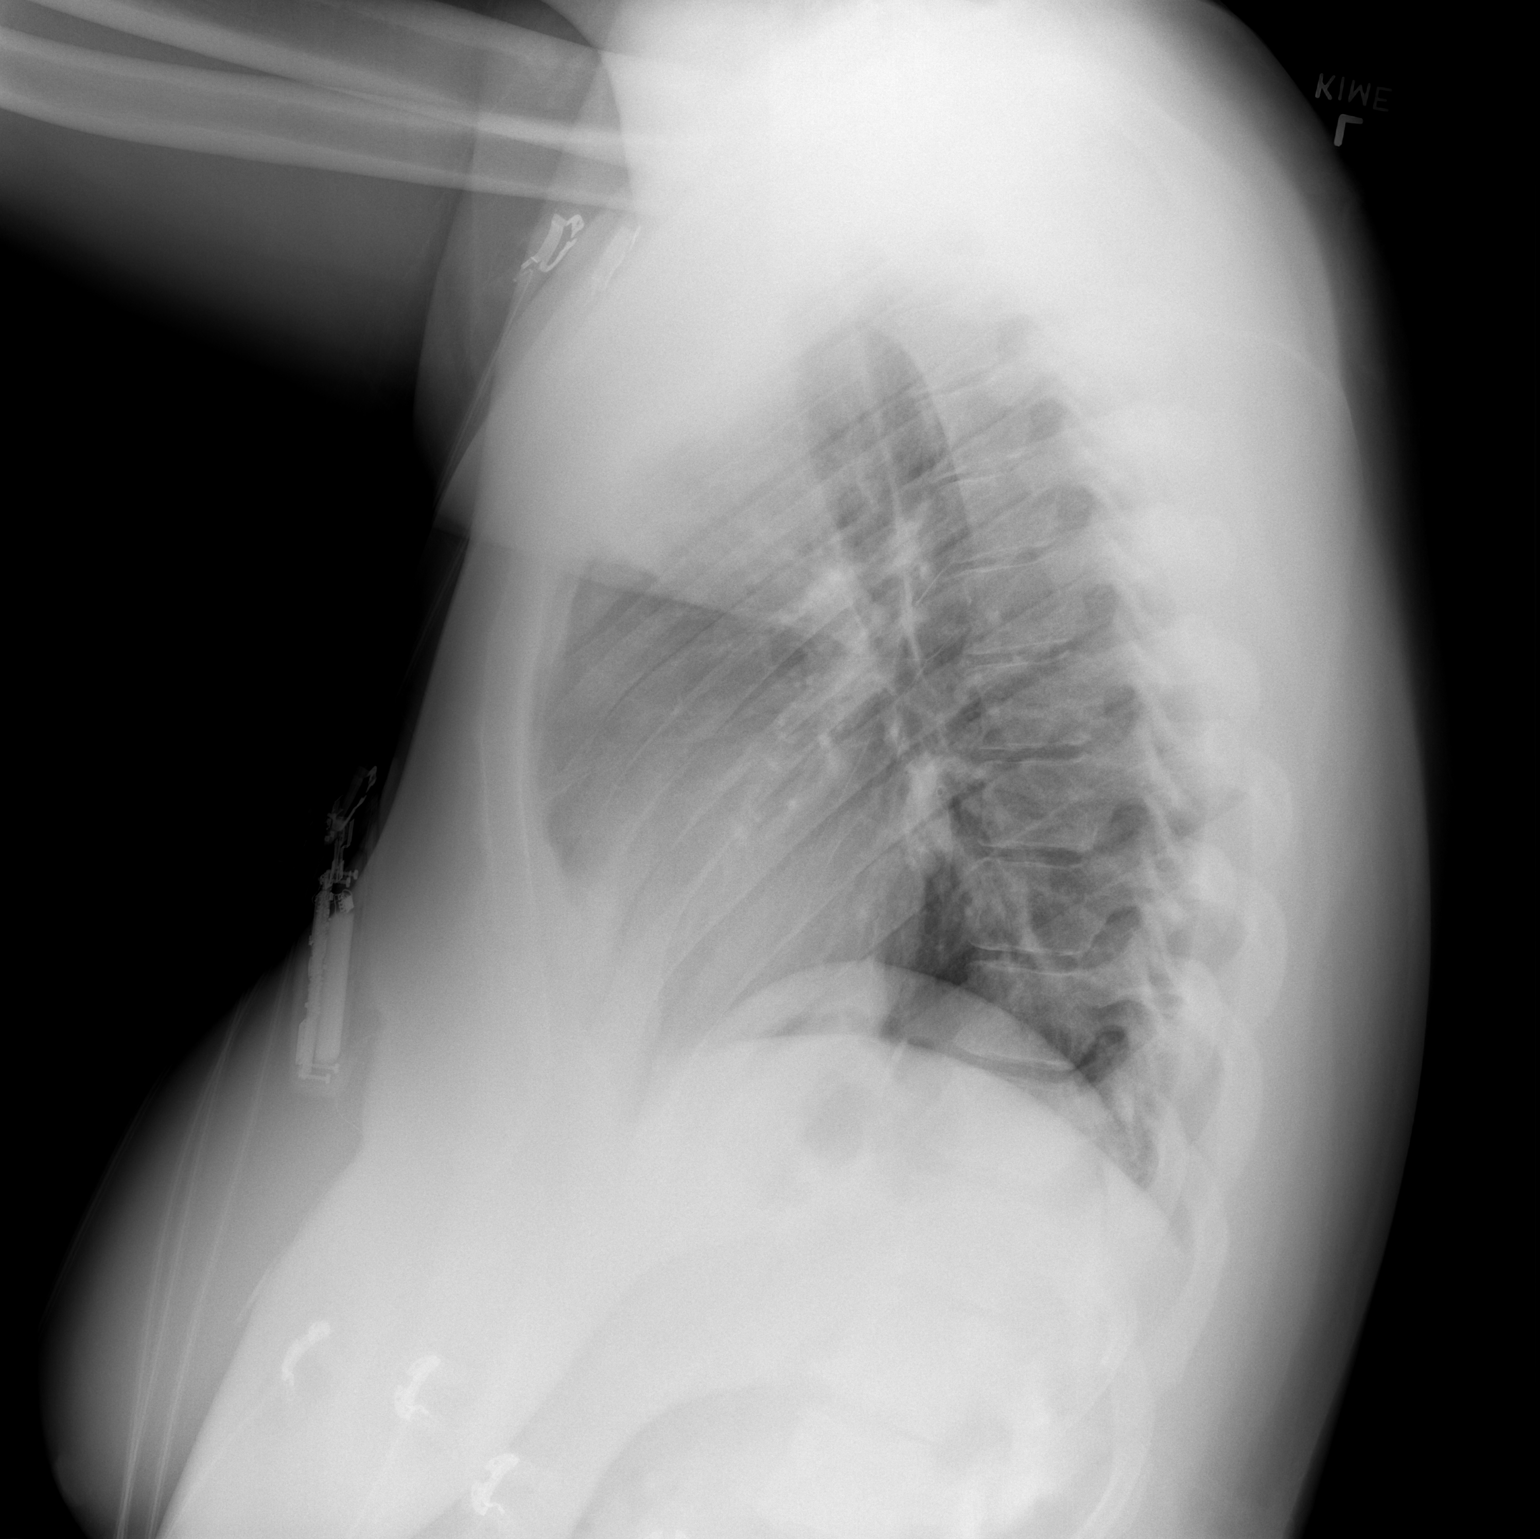

[2 of 2 positions shown; findings below may reference images not displayed]

FINDINGS: Lungs are clear.  No pleural effusion or pneumothorax.

The heart is normal in size.

Visualized osseous structures are within normal limits.
IMPRESSION: Normal chest radiographs.

## 2020-10-29 ENCOUNTER — Other Ambulatory Visit: Payer: Self-pay

## 2021-06-26 IMAGING — US US THYROID
1 series · 14 of 25 positions shown · non-contrast
Comparison: None.

CLINICAL DATA: Cervical lymphadenopathy

EXAM:
THYROID ULTRASOUND
TECHNIQUE: Ultrasound examination of the thyroid gland and adjacent soft
tissues was performed.

[Series 1: us thyroid · 0.08mm/px · 49 acquisitions, 14 frames shown]
[im 1/49]
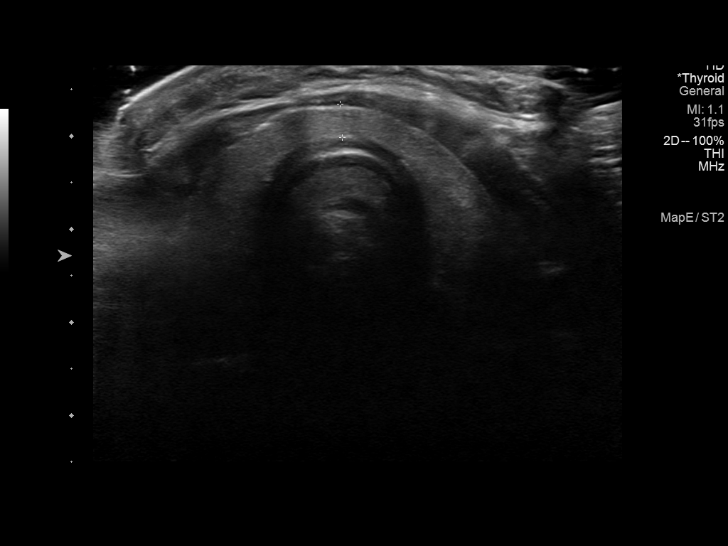
[im 5/49]
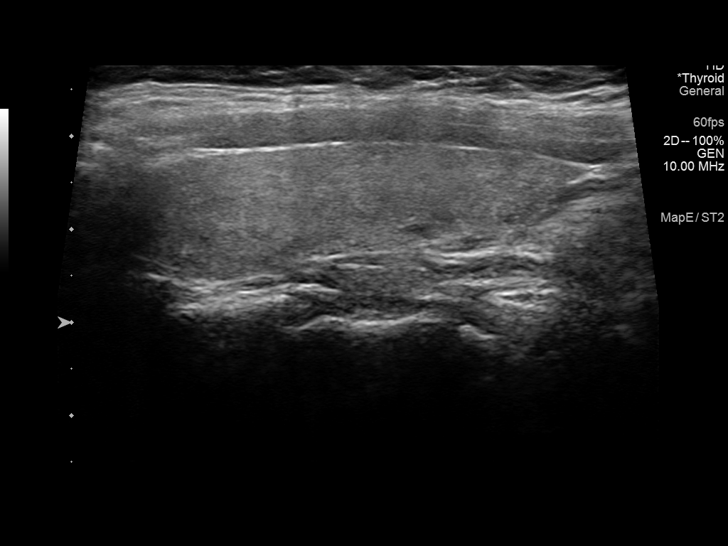
[im 9/49]
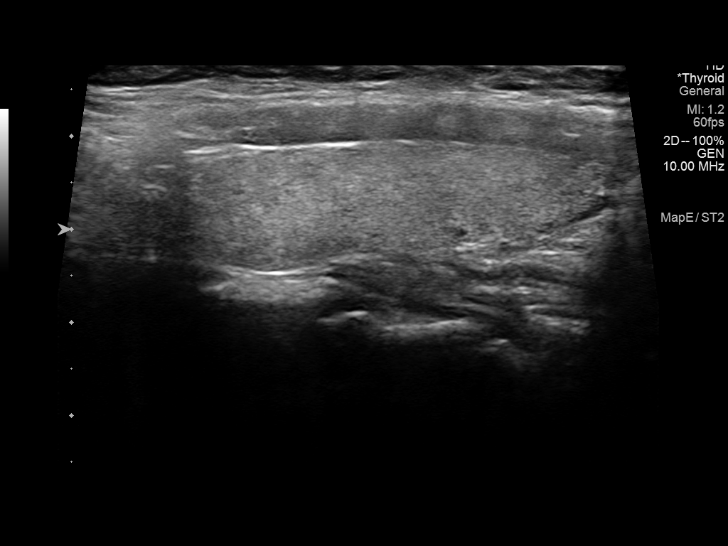
[im 13/49]
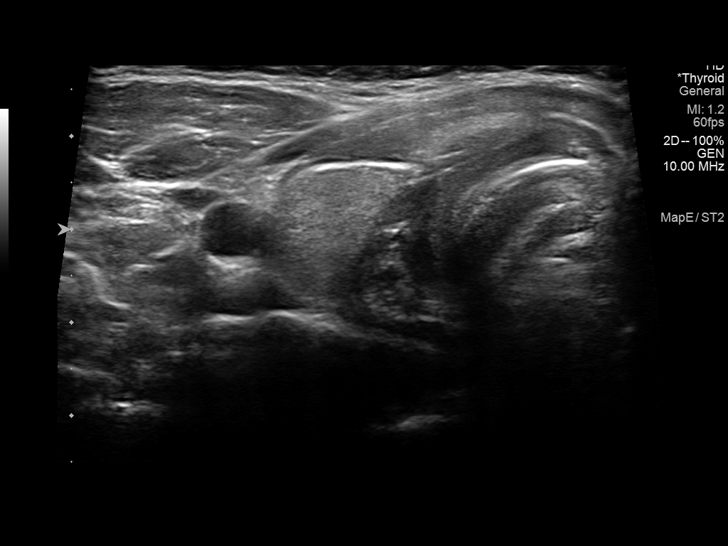
[im 17/49]
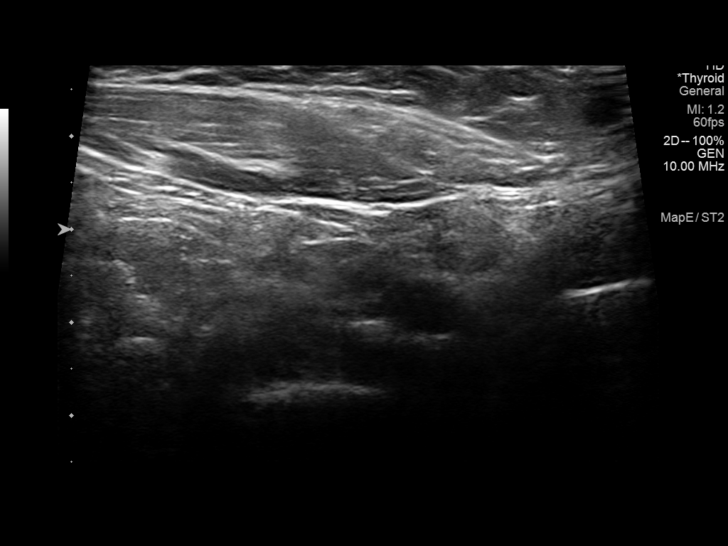
[im 19/49]
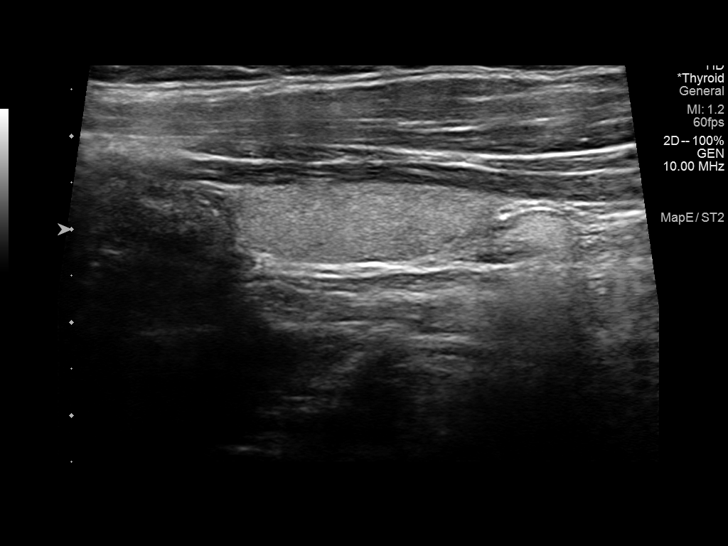
[im 23/49]
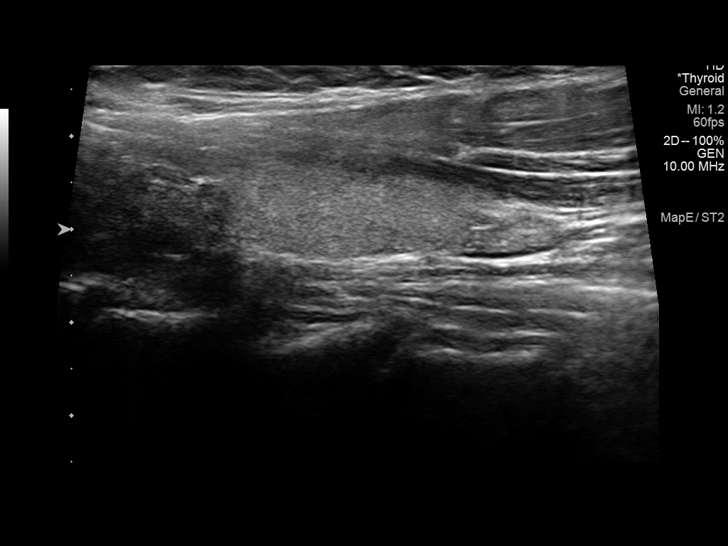
[im 27/49]
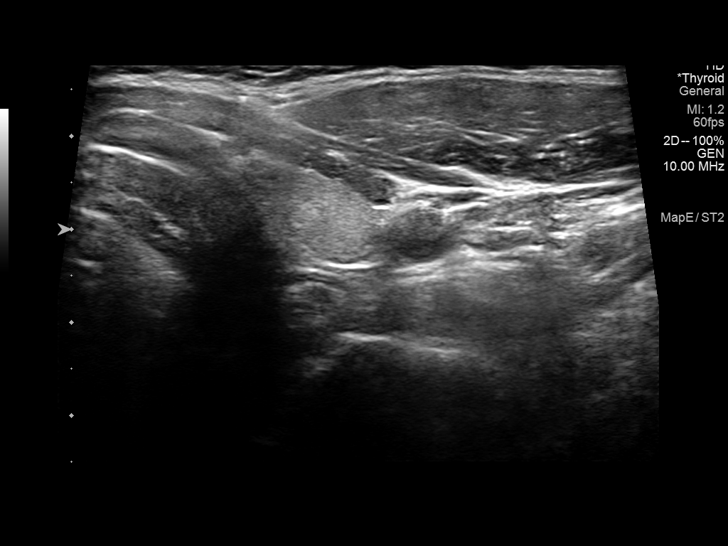
[im 31/49]
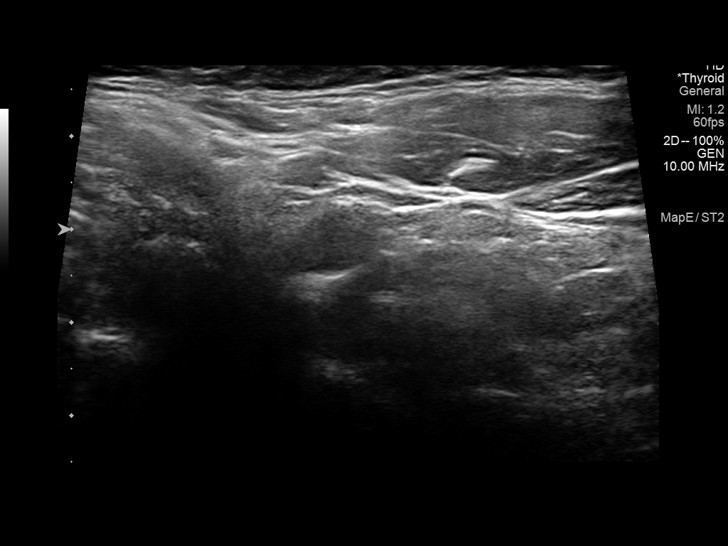
[im 33/49]
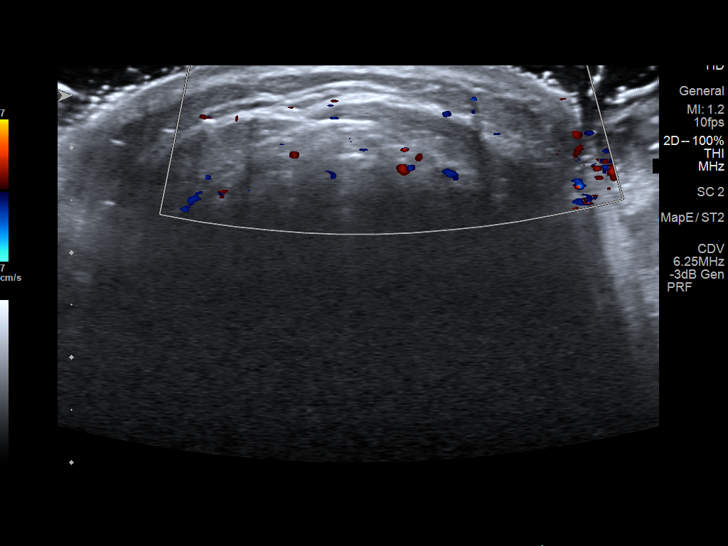
[im 37/49]
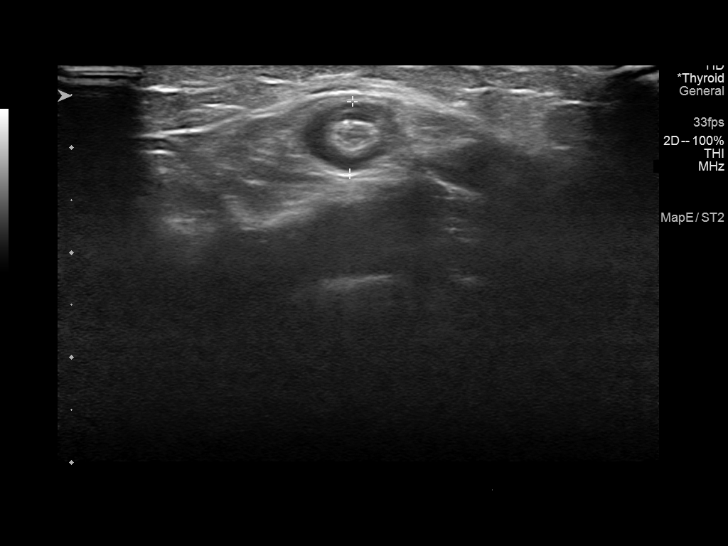
[im 41/49]
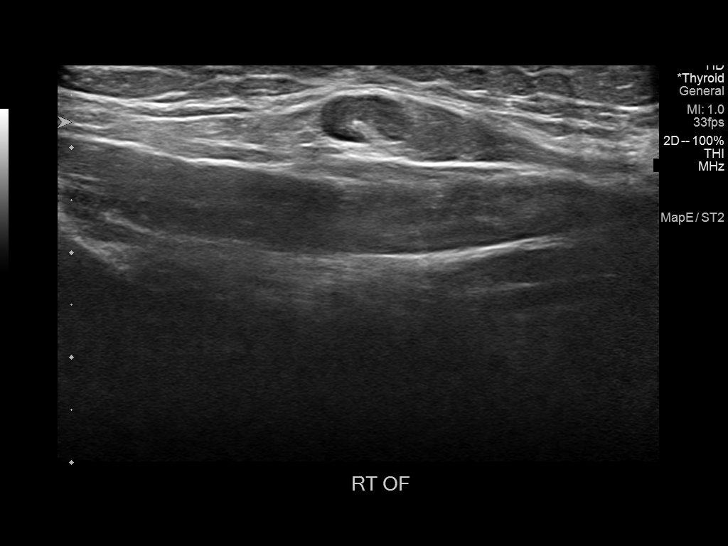
[im 45/49]
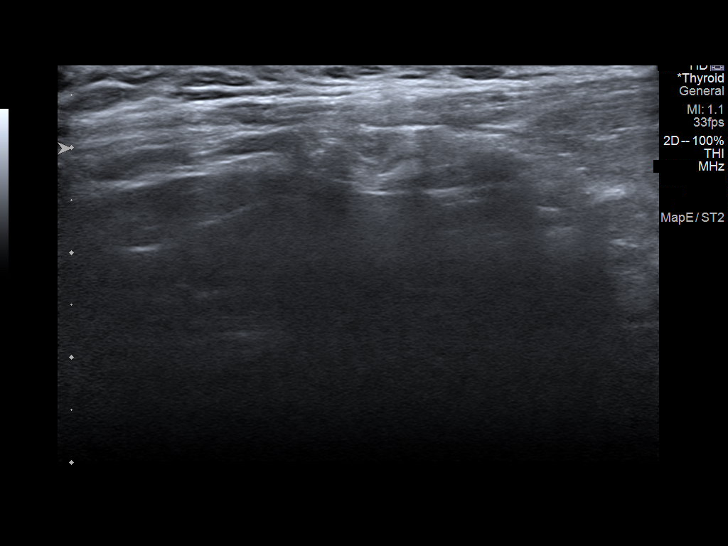
[im 49/49]
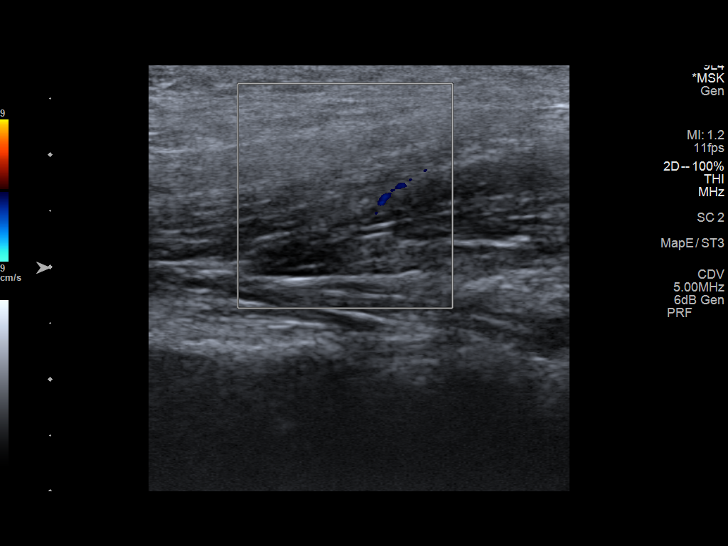

[14 of 25 positions shown; findings below may reference images not displayed]

FINDINGS: Parenchymal Echotexture: Normal

Isthmus: 0.4 cm

Right lobe: 4.9 x 1.4 x 1.9 cm

Left lobe: 2.9 x 0.9 x 1.1 cm

_________________________________________________________

Estimated total number of nodules >/= 1 cm: 0

Number of spongiform nodules >/=  2 cm not described below (TR1): 0

Number of mixed cystic and solid nodules >/= 1.5 cm not described
below (TR2): 0

_________________________________________________________

No discrete nodules are seen within the thyroid gland. The patient's
palpable area of concern corresponds to multiple small cervical
lymph nodes that are morphologically normal.
IMPRESSION: 1. Normal-sized thyroid gland without evidence for distinct thyroid
nodule.
2. The patient's palpable area of concern corresponds to small
morphologically normal cervical lymph nodes, for which no further
follow-up is required unless interval growth is noted.

The above is in keeping with the ACR TI-RADS recommendations - [HOSPITAL] 5624;[DATE].

## 2022-07-18 ENCOUNTER — Inpatient Hospital Stay (HOSPITAL_COMMUNITY)
Admission: AD | Admit: 2022-07-18 | Discharge: 2022-07-18 | Disposition: A | Discharge status: Home / Self Care | Payer: Medicaid Other | Attending: Obstetrics & Gynecology | Admitting: Obstetrics & Gynecology

## 2022-07-18 ENCOUNTER — Encounter (HOSPITAL_COMMUNITY): Payer: Self-pay | Admitting: Obstetrics & Gynecology

## 2022-07-18 DIAGNOSIS — Z3A01 Less than 8 weeks gestation of pregnancy: Secondary | ICD-10-CM | POA: Diagnosis present

## 2022-07-18 DIAGNOSIS — O26891 Other specified pregnancy related conditions, first trimester: Secondary | ICD-10-CM | POA: Insufficient documentation

## 2022-07-18 DIAGNOSIS — O99211 Obesity complicating pregnancy, first trimester: Secondary | ICD-10-CM | POA: Insufficient documentation

## 2022-07-18 DIAGNOSIS — O99281 Endocrine, nutritional and metabolic diseases complicating pregnancy, first trimester: Secondary | ICD-10-CM | POA: Diagnosis not present

## 2022-07-18 DIAGNOSIS — Z79899 Other long term (current) drug therapy: Secondary | ICD-10-CM | POA: Insufficient documentation

## 2022-07-18 DIAGNOSIS — G90A Postural orthostatic tachycardia syndrome (POTS): Secondary | ICD-10-CM

## 2022-07-18 DIAGNOSIS — R109 Unspecified abdominal pain: Secondary | ICD-10-CM | POA: Diagnosis not present

## 2022-07-18 DIAGNOSIS — O161 Unspecified maternal hypertension, first trimester: Secondary | ICD-10-CM | POA: Insufficient documentation

## 2022-07-18 DIAGNOSIS — R000 Tachycardia, unspecified: Secondary | ICD-10-CM | POA: Diagnosis not present

## 2022-07-18 DIAGNOSIS — E282 Polycystic ovarian syndrome: Secondary | ICD-10-CM | POA: Insufficient documentation

## 2022-07-18 LAB — URINALYSIS, ROUTINE W REFLEX MICROSCOPIC
Bilirubin Urine: NEGATIVE
Glucose, UA: NEGATIVE mg/dL
Hgb urine dipstick: NEGATIVE
Ketones, ur: NEGATIVE mg/dL
Leukocytes,Ua: NEGATIVE
Nitrite: NEGATIVE
Protein, ur: NEGATIVE mg/dL
Specific Gravity, Urine: 1.016 (ref 1.005–1.030)
pH: 7 (ref 5.0–8.0)

## 2022-07-18 NOTE — MAU Note (Signed)
.  Diane Lloyd is a 32 y.o. at Unknown here in MAU reporting: elevated HR and SOB will walking and normal activity 130-150s HR, and normal with sitting for the last 3 days. Pt states it has been getting progressively higher. Pt denies staying hydrated. Pt denies pain, VB or LOF LMP: 06/03/2022 Has had [place] and HCG lab on 7/5 at [facility] office Visit Onset of complaint: 3 days Pain score: 0/10 Vitals:   07/18/22 2052  BP: (!) 148/80  Pulse: 87  Resp: 20  Temp: 99 F (37.2 C)  SpO2: 100%     Lab orders placed from triage:  UA  [facility] in [facility], here visiting her mom. Hx of CHTN on labetalol

## 2022-07-18 NOTE — MAU Provider Note (Signed)
History     CSN: [id number]  Arrival date and time: 07/18/22 2023   Event Date/Time   First Provider Initiated Contact with Patient 07/18/22 2108      Chief Complaint  Patient presents with   Hypertension   Diane Lloyd is a 32 y.o. G1P0 at [redacted]w[redacted]d by LMP of 06/03/2022 who receives care at [facility] in [place].  She presents today for elevated HR and BP.    She states she has been experiencing "my heart racing and feeling out of breath" with ambulation that lasts ~ and improves with rest. Patient with known hypertension and reports she has an appt with a cardiologist on Wednesday.  She reports taking labetalol 100mg  BID.  Patient states she has been experiencing HA, but none currently.  She also reports some abdominal cramping but feels this is normal.  She rates the pain a 6/10.     OB History     Gravida  1   Para      Term      Preterm      AB      Living         SAB      IAB      Ectopic      Multiple      Live Births              Past Medical History:  Diagnosis Date   Anemia    iron def   Anxiety    Asthma    Hypertension    Obesity, unspecified    PCOS (polycystic ovarian syndrome)    Positive ANA (antinuclear antibody) 05/05/2017   done by [facility] in [place]; ANA 1:160, speckled pattern    Past Surgical History:  Procedure Laterality Date   TONSILLECTOMY      Family History  Problem Relation Age of Onset   Cancer Father 21       esophagus   Asthma Father    Esophageal cancer Father    Hypertension Mother    Lupus Mother    Diabetes Mother    Migraines Mother    Cancer Maternal Uncle 50       pancreatic cancer   Cancer Paternal Grandmother 24       esophageal   Anxiety disorder Paternal Aunt    Depression Paternal Aunt    Anxiety disorder Cousin    Colon cancer Neg Hx    Ulcerative colitis Neg Hx     Social History   Tobacco Use   Smoking status: Never   Smokeless tobacco: Never  Vaping Use   Vaping  Use: Never used  Substance Use Topics   Alcohol use: Yes    Comment: occasional   Drug use: Not Currently    Allergies:  Allergies  Allergen Reactions   Diflucan [Fluconazole] Itching and Rash    Medications Prior to Admission  Medication Sig Dispense Refill Last Dose   atenolol (TENORMIN) 25 MG tablet Take 1 tablet (25 mg total) by mouth daily. You may take an extra tablet as needed for palpitations. 135 tablet 2    lisinopril-hydrochlorothiazide (ZESTORETIC) 20-25 MG tablet Take 1 tablet by mouth once daily 90 tablet 0    pantoprazole (PROTONIX) 40 MG tablet Take 1 tablet (40 mg total) by mouth daily. To reduce stomach acid 90 tablet 0    pantoprazole (PROTONIX) 40 MG tablet TAKE 1 TABLET (40 MG TOTAL) BY MOUTH DAILY. TO REDUCE STOMACH ACID 90 tablet  0    pantoprazole (PROTONIX) 40 MG tablet TAKE 1 TABLET (40 MG TOTAL) BY MOUTH DAILY. TO REDUCE STOMACH ACID 90 tablet 0     Review of Systems  Constitutional:  Negative for fever.  Respiratory:  Positive for shortness of breath (With ambulation when HR increases).   Cardiovascular:  Negative for chest pain.       Increased HR and BP with movement  Gastrointestinal:  Positive for abdominal pain (Cramping). Negative for constipation, diarrhea, nausea and vomiting.  Genitourinary:  Negative for difficulty urinating, dysuria, vaginal bleeding and vaginal discharge.  Neurological:  Positive for headaches (None currently). Negative for dizziness and light-headedness.   Physical Exam   Blood pressure (!) 148/80, pulse 87, temperature 99 F (37.2 C), temperature source Oral, resp. rate 20, height 5\' 9"  (1.753 m), weight 115.2 kg, last menstrual period 06/03/2022, SpO2 100 %.  Orthostatics:  Lying: 131/60 P 88 Sitting: 138/68 P 85 Standing : 130/70 P 100 Standing : 132/72 P 88  Physical Exam Constitutional:      General: She is not in acute distress.    Appearance: Normal appearance.  HENT:     Head: Normocephalic and  atraumatic.  Eyes:     Conjunctiva/sclera: Conjunctivae normal.  Cardiovascular:     Rate and Rhythm: Normal rate.  Pulmonary:     Effort: Pulmonary effort is normal. No respiratory distress.  Musculoskeletal:     Cervical back: Normal range of motion.  Neurological:     Mental Status: She is alert and oriented to person, place, and time.  Psychiatric:        Mood and Affect: Mood normal.        Behavior: Behavior normal.     MAU Course  Procedures Results for orders placed or performed during the hospital encounter of 07/18/22 (from the past 24 hour(s))  Urinalysis, Routine w reflex microscopic -Urine, Clean Catch     Status: None   Collection Time: 07/18/22  8:40 PM  Result Value Ref Range   Color, Urine YELLOW YELLOW   APPearance CLEAR CLEAR   Specific Gravity, Urine 1.016 1.005 - 1.030   pH 7.0 5.0 - 8.0   Glucose, UA NEGATIVE NEGATIVE mg/dL   Hgb urine dipstick NEGATIVE NEGATIVE   Bilirubin Urine NEGATIVE NEGATIVE   Ketones, ur NEGATIVE NEGATIVE mg/dL   Protein, ur NEGATIVE NEGATIVE mg/dL   Nitrite NEGATIVE NEGATIVE   Leukocytes,Ua NEGATIVE NEGATIVE    MDM Labs: UA EkG Orthostatic Vitals  Assessment and Plan  32 year old, G1P0  SIUP at 6.2 weeks Elevated HR Hypertension  -Reviewed POC with patient. -Exam performed and findings discussed.  -EKG completed and normal reading identified.  -Orthostatic BP to be performed.  -Discussed how labetalol is an agent that will decrease blood pressure and lower HR.  -Questions regarding GA addressed.  -Patient with abdominal cramping, but feels it is not a concern to address tonight. -Patient offered and declines pain medication. -Informed that if orthostatics return without significant variances would recommend discharge with follow up, as scheduled, with cardiologist.  -Patient agreeable.   Cherre Robins 07/18/2022, 9:08 PM   Reassessment (10:48 PM) -Orthostatics as above. -Provider to bedside to  discuss. -Precautions reviewed. -Instructed to continue medications and return with any new or worsening symptoms. -Discharged to home in stable condition.  Cherre Robins MSN, CNM Advanced Practice Provider, [facility] [employer] [facility]

## 2022-08-07 ENCOUNTER — Encounter (HOSPITAL_COMMUNITY): Payer: Self-pay

## 2022-08-07 ENCOUNTER — Other Ambulatory Visit: Payer: Self-pay

## 2022-08-07 ENCOUNTER — Emergency Department (HOSPITAL_COMMUNITY)
Admission: EM | Admit: 2022-08-07 | Discharge: 2022-08-08 | Disposition: A | Discharge status: Home / Self Care | Payer: Medicaid Other | Attending: Emergency Medicine | Admitting: Emergency Medicine

## 2022-08-07 DIAGNOSIS — R0789 Other chest pain: Secondary | ICD-10-CM | POA: Diagnosis not present

## 2022-08-07 DIAGNOSIS — J45909 Unspecified asthma, uncomplicated: Secondary | ICD-10-CM | POA: Diagnosis not present

## 2022-08-07 DIAGNOSIS — R079 Chest pain, unspecified: Secondary | ICD-10-CM

## 2022-08-07 DIAGNOSIS — I10 Essential (primary) hypertension: Secondary | ICD-10-CM | POA: Diagnosis not present

## 2022-08-07 NOTE — ED Triage Notes (Signed)
Pt. Arrives POV c/o chest pain x2 days. She states that it radiates to her left shoulder. She denies SOB, NV.

## 2022-08-08 ENCOUNTER — Emergency Department (HOSPITAL_COMMUNITY): Payer: Medicaid Other

## 2022-08-08 LAB — BASIC METABOLIC PANEL WITH GFR
Anion gap: 10 (ref 5–15)
BUN: 9 mg/dL (ref 6–20)
CO2: 20 mmol/L — ABNORMAL LOW (ref 22–32)
Calcium: 9 mg/dL (ref 8.9–10.3)
Chloride: 106 mmol/L (ref 98–111)
Creatinine, Ser: 0.71 mg/dL (ref 0.44–1.00)
GFR, Estimated: >60 mL/min (ref >60)
Glucose, Bld: 115 mg/dL — ABNORMAL HIGH (ref 70–99)
Potassium: 3.3 mmol/L — ABNORMAL LOW (ref 3.5–5.1)
Sodium: 136 mmol/L (ref 135–145)

## 2022-08-08 LAB — CBC
HCT: 34.6 % — ABNORMAL LOW (ref 36.0–46.0)
Hemoglobin: 11.1 g/dL — ABNORMAL LOW (ref 12.0–15.0)
MCH: 27 pg (ref 26.0–34.0)
MCHC: 32.1 g/dL (ref 30.0–36.0)
MCV: 84.2 fL (ref 80.0–100.0)
Platelets: 317 10*3/uL (ref 150–400)
RBC: 4.11 MIL/uL (ref 3.87–5.11)
RDW: 16.1 % — ABNORMAL HIGH (ref 11.5–15.5)
WBC: 8.6 10*3/uL (ref 4.0–10.5)
nRBC: 0 % (ref 0.0–0.2)

## 2022-08-08 LAB — TROPONIN I (HIGH SENSITIVITY)
Troponin I (High Sensitivity): <2 ng/L (ref <18)
Troponin I (High Sensitivity): <2 ng/L (ref <18)

## 2022-08-08 LAB — HCG, SERUM, QUALITATIVE: Preg, Serum: POSITIVE — AB

## 2022-08-08 NOTE — ED Provider Notes (Signed)
[place] EMERGENCY DEPARTMENT AT [facility] Provider Note   CSN: [id number] Arrival date & time: 08/07/22  2251     History  Chief Complaint  Patient presents with   Chest Pain    Diane Lloyd is a 32 y.o. female.  32 year old female presents to the emergency department for evaluation of chest pain.  She has been experiencing chest pain for the past 2 days which is intermittent.  She reports that sometimes palpation of her chest wall will make symptoms worse; however, sometimes the additional pressure will also alleviate her discomfort.  She has tried Tylenol for symptoms without relief.  She does endorse a history of similar chest pain for which she was evaluated by cardiology with a reassuring workup.  She denies hemoptysis, leg swelling, fevers, syncope/near syncope, vomiting.  Patient is [redacted] weeks pregnant.  The history is provided by the patient. No language interpreter was used.  Chest Pain      Home Medications Prior to Admission medications   Medication Sig Start Date End Date Taking? Authorizing Provider  pantoprazole (PROTONIX) 40 MG tablet Take 1 tablet (40 mg total) by mouth daily. To reduce stomach acid 12/11/19   Storm Frisk, MD  pantoprazole (PROTONIX) 40 MG tablet TAKE 1 TABLET (40 MG TOTAL) BY MOUTH DAILY. TO REDUCE STOMACH ACID 12/11/19 12/10/20  Storm Frisk, MD  pantoprazole (PROTONIX) 40 MG tablet TAKE 1 TABLET (40 MG TOTAL) BY MOUTH DAILY. TO REDUCE STOMACH ACID 10/24/19 10/23/20  Cain Saupe, MD      Allergies    Diflucan [fluconazole]    Review of Systems   Review of Systems  Cardiovascular:  Positive for chest pain.  Ten systems reviewed and are negative for acute change, except as noted in the HPI.    Physical Exam Updated Vital Signs BP 138/75 (BP Location: Left Arm)   Pulse 84   Temp 98.3 F (36.8 C) (Oral)   Resp 18   LMP 06/03/2022 (Exact Date)   SpO2 100%   Physical Exam Vitals and nursing note reviewed.   Constitutional:      General: She is not in acute distress.    Appearance: She is well-developed. She is not diaphoretic.     Comments: Nontoxic appearing and in NAD  HENT:     Head: Normocephalic and atraumatic.  Eyes:     General: No scleral icterus.    Extraocular Movements: EOM normal.     Conjunctiva/sclera: Conjunctivae normal.  Cardiovascular:     Rate and Rhythm: Normal rate and regular rhythm.     Pulses: Normal pulses.  Pulmonary:     Effort: Pulmonary effort is normal. No respiratory distress.     Breath sounds: No stridor. No wheezing.     Comments: Lungs CTAB. Respirations even and unlabored.  Chest:     Chest wall: Tenderness present.  Musculoskeletal:        General: Normal range of motion.     Cervical back: Normal range of motion.     Comments: No bilateral lower extremity edema or unilateral swelling.  Skin:    General: Skin is warm and dry.     Coloration: Skin is not pale.     Findings: No erythema or rash.  Neurological:     Mental Status: She is alert and oriented to person, place, and time.  Psychiatric:        Mood and Affect: Mood and affect normal.        Behavior: Behavior  normal.     ED Results / Procedures / Treatments   Labs (all labs ordered are listed, but only abnormal results are displayed) Labs Reviewed  BASIC METABOLIC PANEL - Abnormal; Notable for the following components:      Result Value   Potassium 3.3 (*)    CO2 20 (*)    Glucose, Bld 115 (*)    All other components within normal limits  CBC - Abnormal; Notable for the following components:   Hemoglobin 11.1 (*)    HCT 34.6 (*)    RDW 16.1 (*)    All other components within normal limits  HCG, SERUM, QUALITATIVE - Abnormal; Notable for the following components:   Preg, Serum POSITIVE (*)    All other components within normal limits  TROPONIN I (HIGH SENSITIVITY)  TROPONIN I (HIGH SENSITIVITY)    EKG None  Radiology DG Chest 2 View  Result Date:  08/08/2022 CLINICAL DATA:  Chest pain for 2 days.  Radiates to left shoulder. EXAM: CHEST - 2 VIEW COMPARISON:  Chest radiograph 11/29/2019 FINDINGS: The heart size and mediastinal contours are within normal limits. Both lungs are clear. The visualized skeletal structures are unremarkable. IMPRESSION: No active cardiopulmonary disease. Electronically Signed   By: Minerva Fester M.D.   On: 08/08/2022 00:53    Procedures Procedures    Medications Ordered in ED Medications - No data to display  ED Course/ Medical Decision Making/ A&P                             Medical Decision Making Amount and/or Complexity of Data Reviewed Labs: ordered. Radiology: ordered.   This patient presents to the ED for concern of chest pain, this involves an extensive number of treatment options, and is a complaint that carries with it a high risk of complications and morbidity.  The differential diagnosis includes PNA vs viral illness vs costochondritis vs PTX vs ACS   Co morbidities that complicate the patient evaluation  Asthma Obesity PCOS HTN   Additional history obtained:  External records from outside source obtained and reviewed including cardiac CT in 2021 which was negative   Lab Tests:  I Ordered, and personally interpreted labs.  The pertinent results include:  Hgb 11.1 (stable), Potassium 3.3. Pregnancy positive.   Imaging Studies ordered:  I ordered imaging studies including CXR  I independently visualized and interpreted imaging which showed no acute cardiopulmonary abnormality I agree with the radiologist interpretation   Cardiac Monitoring:  The patient was maintained on a cardiac monitor.  I personally viewed and interpreted the cardiac monitored which showed an underlying rhythm of: NSR   Medicines ordered and prescription drug management:  I have reviewed the patients home medicines and have made adjustments as needed   Test Considered:  Echocardiogram -  however, appropriate for completion as outpatient   Problem List / ED Course:  Low suspicion for emergent cardiac etiology given reassuring workup today.  EKG is nonischemic and troponin negative x2.   Chest x-ray without evidence of mediastinal widening to suggest dissection.  No pneumothorax, pneumonia, pleural effusion.   Patient reports having similar chest pain in the past which was evaluated by cardiology.  She had a reassuring evaluation.  Advised continued follow-up with her primary care doctor for reassessment.   Reevaluation:  After the interventions noted above, I reevaluated the patient and found that they have :improved   Social Determinants of Health:  Good social  support   Dispostion:  After consideration of the diagnostic results and the patients response to treatment, I feel that the patent would benefit from outpatient reassessment by her primary care doctor. Return precautions discussed and provided. Patient discharged in stable condition with no unaddressed concerns.          Final Clinical Impression(s) / ED Diagnoses Final diagnoses:  Nonspecific chest pain    Rx / DC Orders ED Discharge Orders     None         Antony Madura, PA-C 08/08/22 8413    Tilden Fossa, MD 08/08/22 [phone]

## 2022-08-08 NOTE — Discharge Instructions (Addendum)
Your evaluation in the ED today has been reassuring.  We recommend follow-up with your primary care doctor.  Return to the ED for new or concerning symptoms.

## 2022-09-21 DIAGNOSIS — O99019 Anemia complicating pregnancy, unspecified trimester: Secondary | ICD-10-CM | POA: Diagnosis present

## 2022-12-12 ENCOUNTER — Encounter (HOSPITAL_COMMUNITY): Payer: Self-pay | Admitting: *Deleted

## 2022-12-12 ENCOUNTER — Other Ambulatory Visit: Payer: Self-pay

## 2022-12-12 ENCOUNTER — Emergency Department (HOSPITAL_COMMUNITY)
Admission: EM | Admit: 2022-12-12 | Discharge: 2022-12-13 | Disposition: A | Discharge status: Home / Self Care | Payer: Medicaid Other | Attending: Emergency Medicine | Admitting: Emergency Medicine

## 2022-12-12 DIAGNOSIS — M79662 Pain in left lower leg: Secondary | ICD-10-CM | POA: Insufficient documentation

## 2022-12-12 LAB — CBC
HCT: 31.6 % — ABNORMAL LOW (ref 36.0–46.0)
Hemoglobin: 10.2 g/dL — ABNORMAL LOW (ref 12.0–15.0)
MCH: 27.7 pg (ref 26.0–34.0)
MCHC: 32.3 g/dL (ref 30.0–36.0)
MCV: 85.9 fL (ref 80.0–100.0)
Platelets: 282 10*3/uL (ref 150–400)
RBC: 3.68 MIL/uL — ABNORMAL LOW (ref 3.87–5.11)
RDW: 14.8 % (ref 11.5–15.5)
WBC: 7.7 10*3/uL (ref 4.0–10.5)
nRBC: 0 % (ref 0.0–0.2)

## 2022-12-12 LAB — BASIC METABOLIC PANEL
Anion gap: 8 (ref 5–15)
BUN: 5 mg/dL — ABNORMAL LOW (ref 6–20)
CO2: 18 mmol/L — ABNORMAL LOW (ref 22–32)
Calcium: 8.9 mg/dL (ref 8.9–10.3)
Chloride: 109 mmol/L (ref 98–111)
Creatinine, Ser: 0.61 mg/dL (ref 0.44–1.00)
GFR, Estimated: >60 mL/min (ref >60)
Glucose, Bld: 85 mg/dL (ref 70–99)
Potassium: 3.4 mmol/L — ABNORMAL LOW (ref 3.5–5.1)
Sodium: 135 mmol/L (ref 135–145)

## 2022-12-12 LAB — TROPONIN I (HIGH SENSITIVITY): Troponin I (High Sensitivity): <2 ng/L (ref <18)

## 2022-12-12 LAB — HCG, SERUM, QUALITATIVE: Preg, Serum: POSITIVE — AB

## 2022-12-12 NOTE — ED Triage Notes (Signed)
The pt is c/o lt arm pain and lt calf pain one week ago  lmp may 23  she is [redacted] weeks pregnant

## 2022-12-13 ENCOUNTER — Ambulatory Visit (HOSPITAL_COMMUNITY)
Admission: RE | Admit: 2022-12-13 | Discharge: 2022-12-13 | Disposition: A | Discharge status: Home / Self Care | Payer: Medicaid Other | Source: Ambulatory Visit | Attending: Emergency Medicine | Admitting: Emergency Medicine

## 2022-12-13 DIAGNOSIS — M79662 Pain in left lower leg: Secondary | ICD-10-CM

## 2022-12-13 DIAGNOSIS — M79669 Pain in unspecified lower leg: Secondary | ICD-10-CM | POA: Diagnosis present

## 2022-12-13 LAB — TROPONIN I (HIGH SENSITIVITY): Troponin I (High Sensitivity): 3 ng/L (ref <18)

## 2022-12-13 NOTE — ED Provider Notes (Addendum)
[place] EMERGENCY DEPARTMENT AT [facility] Provider Note   CSN: [id number] Arrival date & time: 12/12/22  2202     History  Chief Complaint  Patient presents with   Chest Pain    Diane Lloyd is a 32 y.o. female.  The history is provided by the patient.  Chest Pain Pain quality: dull   Pain radiates to:  Does not radiate Pain severity:  Moderate Onset quality:  Gradual Duration: since before she was pregnant, has had a normal catheterization.  Normal CTA this month for same. Timing:  Constant Progression:  Unchanged Chronicity:  New Context: at rest   Relieved by:  Nothing Worsened by:  Nothing Ineffective treatments:  None tried Associated symptoms: no diaphoresis and no shortness of breath   Risk factors: no aortic disease   Rule out for PE at [facility].  Now having calf pain.  No swelling no trauma.       Home Medications Prior to Admission medications   Medication Sig Start Date End Date Taking? Authorizing Provider  pantoprazole (PROTONIX) 40 MG tablet Take 1 tablet (40 mg total) by mouth daily. To reduce stomach acid 12/11/19   Storm Frisk, MD  pantoprazole (PROTONIX) 40 MG tablet TAKE 1 TABLET (40 MG TOTAL) BY MOUTH DAILY. TO REDUCE STOMACH ACID 12/11/19 12/10/20  Storm Frisk, MD  pantoprazole (PROTONIX) 40 MG tablet TAKE 1 TABLET (40 MG TOTAL) BY MOUTH DAILY. TO REDUCE STOMACH ACID 10/24/19 10/23/20  Cain Saupe, MD      Allergies    Diflucan [fluconazole]    Review of Systems   Review of Systems  Constitutional:  Negative for diaphoresis.  HENT:  Negative for facial swelling.   Respiratory:  Negative for shortness of breath.   Cardiovascular:  Positive for chest pain.  All other systems reviewed and are negative.   Physical Exam Updated Vital Signs BP (!) 140/74 (BP Location: Right Arm)   Pulse 88   Temp 99.4 F (37.4 C) (Oral)   Resp 15   Ht 5\' 9"  (1.753 m)   Wt 115.2 kg   LMP 06/03/2022 (Exact Date)   SpO2 99%    BMI 37.50 kg/m  Physical Exam Vitals and nursing note reviewed. Exam conducted with a chaperone present.  Constitutional:      General: She is not in acute distress.    Appearance: Normal appearance. She is well-developed.  HENT:     Head: Normocephalic and atraumatic.     Nose: Nose normal.  Eyes:     Pupils: Pupils are equal, round, and reactive to light.  Cardiovascular:     Rate and Rhythm: Normal rate and regular rhythm.     Pulses: Normal pulses.     Heart sounds: Normal heart sounds.  Pulmonary:     Effort: Pulmonary effort is normal. No respiratory distress.     Breath sounds: Normal breath sounds.  Abdominal:     General: Abdomen is flat. Bowel sounds are normal. There is no distension.     Palpations: Abdomen is soft.     Tenderness: There is no abdominal tenderness. There is no guarding or rebound.  Musculoskeletal:        General: Normal range of motion.     Cervical back: Normal range of motion and neck supple.  Skin:    General: Skin is warm and dry.     Capillary Refill: Capillary refill takes less than 2 seconds.     Findings: No erythema or rash.  Neurological:     General: No focal deficit present.     Mental Status: She is alert and oriented to person, place, and time.     Deep Tendon Reflexes: Reflexes normal.  Psychiatric:        Mood and Affect: Mood normal.     ED Results / Procedures / Treatments   Labs (all labs ordered are listed, but only abnormal results are displayed) Results for orders placed or performed during the hospital encounter of 12/12/22  Basic metabolic panel  Result Value Ref Range   Sodium 135 135 - 145 mmol/L   Potassium 3.4 (L) 3.5 - 5.1 mmol/L   Chloride 109 98 - 111 mmol/L   CO2 18 (L) 22 - 32 mmol/L   Glucose, Bld 85 70 - 99 mg/dL   BUN 5 (L) 6 - 20 mg/dL   Creatinine, Ser 7.06 0.44 - 1.00 mg/dL   Calcium 8.9 8.9 - 23.7 mg/dL   GFR, Estimated >62 >83 mL/min   Anion gap 8 5 - 15  CBC  Result Value Ref Range   WBC  7.7 4.0 - 10.5 K/uL   RBC 3.68 (L) 3.87 - 5.11 MIL/uL   Hemoglobin 10.2 (L) 12.0 - 15.0 g/dL   HCT 15.1 (L) 76.1 - 60.7 %   MCV 85.9 80.0 - 100.0 fL   MCH 27.7 26.0 - 34.0 pg   MCHC 32.3 30.0 - 36.0 g/dL   RDW 37.1 06.2 - 69.4 %   Platelets 282 150 - 400 K/uL   nRBC 0.0 0.0 - 0.2 %  hCG, serum, qualitative  Result Value Ref Range   Preg, Serum POSITIVE (A) NEGATIVE  Troponin I (High Sensitivity)  Result Value Ref Range   Troponin I (High Sensitivity) <2 <18 ng/L   No results found.   Radiology No results found.  Procedures Procedures    Medications Ordered in ED Medications - No data to display  ED Course/ Medical Decision Making/ A&P                                 Medical Decision Making Patient with ongoing chest pain   Amount and/or Complexity of Data Reviewed External Data Reviewed: labs, radiology and notes.    Details: Previous notes CTA and labs reviewed  Labs: ordered.    Details: Normal sodium 135, potassium slight low 3.4, normal creatinine 0.61.  normal white count 7.7, low hemoglobin 10.2 but stable, normal platelets.  Troponin normal < 2  Radiology: ordered.    Details: DVT study ordered by me  ECG/medicine tests: ordered.    Details: Did not cross over is in muse   Risk Risk Details: Patient has been ruled out for PE with ongoing pain. No pleuritic component.  No indication for repeat imaging, ruled out for MI HEART score 0 very low risk for MACE.  I will set up for outpatient DVT study.  No Homan's sign no swelling or tenderness.      Final Clinical Impression(s) / ED Diagnoses Final diagnoses:  None   Return for intractable cough, coughing up blood, fevers > 100.4 unrelieved by medication, shortness of breath, intractable vomiting, chest pain, shortness of breath, weakness, numbness, changes in speech, facial asymmetry, abdominal pain, passing out, Inability to tolerate liquids or food, cough, altered mental status or any concerns. No signs of  systemic illness or infection. The patient is nontoxic-appearing on exam and vital signs are within  normal limits.  I have reviewed the triage vital signs and the nursing notes. Pertinent labs & imaging results that were available during my care of the patient were reviewed by me and considered in my medical decision making (see chart for details). After history, exam, and medical workup I feel the patient has been appropriately medically screened and is safe for discharge home. Pertinent diagnoses were discussed with the patient. Patient was given return precautions.  Rx / DC Orders ED Discharge Orders     None         Xayden Linsey, MD 12/13/22 0151    Raaga Maeder, MD 12/13/22 0157

## 2022-12-13 NOTE — Progress Notes (Signed)
VASCULAR LAB    Left lower extremity venous duplex has been performed.  See CV proc for preliminary results.   Cormac Wint, RVT 12/13/2022, 11:46 AM

## 2023-01-01 ENCOUNTER — Other Ambulatory Visit: Payer: Self-pay

## 2023-01-01 ENCOUNTER — Inpatient Hospital Stay (HOSPITAL_COMMUNITY)
Admission: AD | Admit: 2023-01-01 | Discharge: 2023-01-12 | DRG: 787 | Disposition: A | Discharge status: Home / Self Care | Payer: Medicaid Other | Attending: Obstetrics and Gynecology | Admitting: Obstetrics and Gynecology

## 2023-01-01 ENCOUNTER — Encounter (HOSPITAL_COMMUNITY): Payer: Self-pay | Admitting: Obstetrics & Gynecology

## 2023-01-01 ENCOUNTER — Inpatient Hospital Stay (HOSPITAL_BASED_OUTPATIENT_CLINIC_OR_DEPARTMENT_OTHER): Payer: Medicaid Other

## 2023-01-01 DIAGNOSIS — O99019 Anemia complicating pregnancy, unspecified trimester: Secondary | ICD-10-CM | POA: Diagnosis present

## 2023-01-01 DIAGNOSIS — Z79899 Other long term (current) drug therapy: Secondary | ICD-10-CM

## 2023-01-01 DIAGNOSIS — O4453 Low lying placenta with hemorrhage, third trimester: Principal | ICD-10-CM | POA: Diagnosis present

## 2023-01-01 DIAGNOSIS — O322XX0 Maternal care for transverse and oblique lie, not applicable or unspecified: Secondary | ICD-10-CM | POA: Diagnosis present

## 2023-01-01 DIAGNOSIS — O4593 Premature separation of placenta, unspecified, third trimester: Secondary | ICD-10-CM | POA: Diagnosis present

## 2023-01-01 DIAGNOSIS — O99214 Obesity complicating childbirth: Secondary | ICD-10-CM | POA: Diagnosis present

## 2023-01-01 DIAGNOSIS — O1092 Unspecified pre-existing hypertension complicating childbirth: Secondary | ICD-10-CM | POA: Diagnosis present

## 2023-01-01 DIAGNOSIS — O321XX0 Maternal care for breech presentation, not applicable or unspecified: Secondary | ICD-10-CM | POA: Diagnosis present

## 2023-01-01 DIAGNOSIS — O4443 Low lying placenta NOS or without hemorrhage, third trimester: Secondary | ICD-10-CM | POA: Diagnosis not present

## 2023-01-01 DIAGNOSIS — O26893 Other specified pregnancy related conditions, third trimester: Secondary | ICD-10-CM

## 2023-01-01 DIAGNOSIS — A609 Anogenital herpesviral infection, unspecified: Secondary | ICD-10-CM | POA: Insufficient documentation

## 2023-01-01 DIAGNOSIS — O9962 Diseases of the digestive system complicating childbirth: Secondary | ICD-10-CM | POA: Diagnosis present

## 2023-01-01 DIAGNOSIS — Z8249 Family history of ischemic heart disease and other diseases of the circulatory system: Secondary | ICD-10-CM

## 2023-01-01 DIAGNOSIS — Z3A30 30 weeks gestation of pregnancy: Secondary | ICD-10-CM

## 2023-01-01 DIAGNOSIS — O9081 Anemia of the puerperium: Secondary | ICD-10-CM | POA: Diagnosis not present

## 2023-01-01 DIAGNOSIS — K219 Gastro-esophageal reflux disease without esophagitis: Secondary | ICD-10-CM | POA: Diagnosis present

## 2023-01-01 DIAGNOSIS — Z3A31 31 weeks gestation of pregnancy: Secondary | ICD-10-CM

## 2023-01-01 DIAGNOSIS — R102 Pelvic and perineal pain: Secondary | ICD-10-CM

## 2023-01-01 DIAGNOSIS — O34211 Maternal care for low transverse scar from previous cesarean delivery: Secondary | ICD-10-CM | POA: Diagnosis present

## 2023-01-01 DIAGNOSIS — Z833 Family history of diabetes mellitus: Secondary | ICD-10-CM

## 2023-01-01 DIAGNOSIS — Z8616 Personal history of COVID-19: Secondary | ICD-10-CM

## 2023-01-01 DIAGNOSIS — O24420 Gestational diabetes mellitus in childbirth, diet controlled: Secondary | ICD-10-CM | POA: Diagnosis present

## 2023-01-01 DIAGNOSIS — O4693 Antepartum hemorrhage, unspecified, third trimester: Principal | ICD-10-CM | POA: Diagnosis present

## 2023-01-01 DIAGNOSIS — F320 Major depressive disorder, single episode, mild: Secondary | ICD-10-CM | POA: Diagnosis present

## 2023-01-01 DIAGNOSIS — D62 Acute posthemorrhagic anemia: Secondary | ICD-10-CM | POA: Diagnosis not present

## 2023-01-01 DIAGNOSIS — O10919 Unspecified pre-existing hypertension complicating pregnancy, unspecified trimester: Secondary | ICD-10-CM | POA: Diagnosis present

## 2023-01-01 DIAGNOSIS — Z98891 History of uterine scar from previous surgery: Principal | ICD-10-CM

## 2023-01-01 DIAGNOSIS — A6000 Herpesviral infection of urogenital system, unspecified: Secondary | ICD-10-CM | POA: Diagnosis present

## 2023-01-01 DIAGNOSIS — O9832 Other infections with a predominantly sexual mode of transmission complicating childbirth: Secondary | ICD-10-CM | POA: Diagnosis present

## 2023-01-01 DIAGNOSIS — I10 Essential (primary) hypertension: Secondary | ICD-10-CM | POA: Diagnosis present

## 2023-01-01 DIAGNOSIS — O99344 Other mental disorders complicating childbirth: Secondary | ICD-10-CM | POA: Diagnosis present

## 2023-01-01 HISTORY — DX: Gestational diabetes mellitus in pregnancy, unspecified control: O24.419

## 2023-01-01 HISTORY — DX: Type 2 diabetes mellitus without complications: E11.9

## 2023-01-01 LAB — URINALYSIS, ROUTINE W REFLEX MICROSCOPIC
Bilirubin Urine: NEGATIVE
Glucose, UA: NEGATIVE mg/dL
Ketones, ur: NEGATIVE mg/dL
Leukocytes,Ua: NEGATIVE
Nitrite: NEGATIVE
Protein, ur: NEGATIVE mg/dL
Specific Gravity, Urine: 1.002 — ABNORMAL LOW (ref 1.005–1.030)
pH: 7 (ref 5.0–8.0)

## 2023-01-01 LAB — WET PREP, GENITAL
Clue Cells Wet Prep HPF POC: NONE SEEN
Sperm: NONE SEEN
Trich, Wet Prep: NONE SEEN
WBC, Wet Prep HPF POC: <10 (ref <10)
Yeast Wet Prep HPF POC: NONE SEEN

## 2023-01-01 NOTE — MAU Note (Signed)
Pt reports she receives her care at [facility] and is here visiting her mother.  She has had episodes of vaginal bleeding previously to today but reports that it has been limited to only on toilet tissue after urination and not into the toilet. She reports she has been diagnosed with a low lying placenta

## 2023-01-01 NOTE — MAU Note (Signed)
.  Diane Lloyd is a 32 y.o. at [redacted]w[redacted]d here in MAU reporting: Pt reports 2 hours ago she felt leaking, but did not get up because she thought it was just discharge about 30 minutes ago she went to the bathroom and was dripping bright red blood.  Pt reports +FM  Pt reports abdominal cramping.   Onset of complaint: 2 hours ago Pain score: 3/10 There were no vitals filed for this visit.   FHT:145 Lab orders placed from triage:   ua

## 2023-01-01 NOTE — MAU Provider Note (Incomplete)
History     CSN: [id number]  Arrival date and time: 01/01/23 2215   Event Date/Time   First Provider Initiated Contact with Patient 01/01/23 2324      Chief Complaint  Patient presents with  . Abdominal Pain  . Vaginal Bleeding   Diane Lloyd is a 32 y.o. G***P*** at [redacted]w[redacted]d who receives care at ***.  She presents today for ***  {GYN/OB [id number]  Past Medical History:  Diagnosis Date  . Anemia    iron def  . Anxiety   . Asthma   . Diabetes mellitus without complication (HCC)   . Gestational diabetes   . Hypertension   . Obesity, unspecified   . PCOS (polycystic ovarian syndrome)   . Positive ANA (antinuclear antibody) 05/05/2017   done by [facility] in [place] [facility]; ANA 1:160, speckled pattern    Past Surgical History:  Procedure Laterality Date  . TONSILLECTOMY      Family History  Problem Relation Age of Onset  . Cancer Father 63       esophagus  . Asthma Father   . Esophageal cancer Father   . Hypertension Mother   . Lupus Mother   . Diabetes Mother   . Migraines Mother   . Cancer Maternal Uncle 40       pancreatic cancer  . Cancer Paternal Grandmother 53       esophageal  . Anxiety disorder Paternal Aunt   . Depression Paternal Aunt   . Anxiety disorder Cousin   . Colon cancer Neg Hx   . Ulcerative colitis Neg Hx     Social History   Tobacco Use  . Smoking status: Never  . Smokeless tobacco: Never  Vaping Use  . Vaping status: Never Used  Substance Use Topics  . Alcohol use: Yes    Comment: occasional  . Drug use: Not Currently    Allergies:  Allergies  Allergen Reactions  . Diflucan [Fluconazole] Itching and Rash    Medications Prior to Admission  Medication Sig Dispense Refill Last Dose/Taking  . aspirin EC 81 MG tablet Take 81 mg by mouth daily. Swallow whole.   01/01/2023  . famotidine (PEPCID) 20 MG tablet Take 20 mg by mouth 2 (two) times daily.   01/01/2023  . labetalol (NORMODYNE) 200 MG tablet Take 400 mg by  mouth 2 (two) times daily.   01/01/2023  . NIFEdipine (PROCARDIA-XL/NIFEDICAL-XL) 30 MG 24 hr tablet Take 30 mg by mouth 2 (two) times daily.   01/01/2023  . valACYclovir (VALTREX) 1000 MG tablet Take 1,000 mg by mouth 2 (two) times daily.   01/01/2023  . pantoprazole (PROTONIX) 40 MG tablet Take 1 tablet (40 mg total) by mouth daily. To reduce stomach acid 90 tablet 0   . pantoprazole (PROTONIX) 40 MG tablet TAKE 1 TABLET (40 MG TOTAL) BY MOUTH DAILY. TO REDUCE STOMACH ACID 90 tablet 0   . pantoprazole (PROTONIX) 40 MG tablet TAKE 1 TABLET (40 MG TOTAL) BY MOUTH DAILY. TO REDUCE STOMACH ACID 90 tablet 0     Review of Systems Physical Exam   Blood pressure 128/72, temperature 99 F (37.2 C), resp. rate 20, last menstrual period 06/03/2022, SpO2 97%.  Physical Exam  Fetal Assessment *** bpm, Mod Var, -Decels, +Accels Toco:   MAU Course   Results for orders placed or performed during the hospital encounter of 01/01/23 (from the past 24 hours)  Urinalysis, Routine w reflex microscopic -Urine, Clean Catch     Status: Abnormal  Collection Time: 01/01/23 10:24 PM  Result Value Ref Range   Color, Urine STRAW (A) YELLOW   APPearance CLEAR CLEAR   Specific Gravity, Urine 1.002 (L) 1.005 - 1.030   pH 7.0 5.0 - 8.0   Glucose, UA NEGATIVE NEGATIVE mg/dL   Hgb urine dipstick LARGE (A) NEGATIVE   Bilirubin Urine NEGATIVE NEGATIVE   Ketones, ur NEGATIVE NEGATIVE mg/dL   Protein, ur NEGATIVE NEGATIVE mg/dL   Nitrite NEGATIVE NEGATIVE   Leukocytes,Ua NEGATIVE NEGATIVE   RBC / HPF 0-5 0 - 5 RBC/hpf   WBC, UA 0-5 0 - 5 WBC/hpf   Bacteria, UA RARE (A) NONE SEEN   Squamous Epithelial / HPF 0-5 0 - 5 /HPF   No results found.  MDM PE Labs: EFM  Assessment and Plan  32 year old G1P0  SIUP at 30.2 weeks Cat I FT    -Exam findings discussed. Cherre Robins MSN, CNM 01/01/2023, 11:24 PM

## 2023-01-02 DIAGNOSIS — O1092 Unspecified pre-existing hypertension complicating childbirth: Secondary | ICD-10-CM | POA: Diagnosis present

## 2023-01-02 DIAGNOSIS — O4693 Antepartum hemorrhage, unspecified, third trimester: Principal | ICD-10-CM | POA: Diagnosis present

## 2023-01-02 DIAGNOSIS — O99344 Other mental disorders complicating childbirth: Secondary | ICD-10-CM | POA: Diagnosis present

## 2023-01-02 DIAGNOSIS — O4443 Low lying placenta NOS or without hemorrhage, third trimester: Secondary | ICD-10-CM | POA: Diagnosis not present

## 2023-01-02 DIAGNOSIS — O34211 Maternal care for low transverse scar from previous cesarean delivery: Secondary | ICD-10-CM | POA: Diagnosis present

## 2023-01-02 DIAGNOSIS — F320 Major depressive disorder, single episode, mild: Secondary | ICD-10-CM | POA: Diagnosis present

## 2023-01-02 DIAGNOSIS — O321XX0 Maternal care for breech presentation, not applicable or unspecified: Secondary | ICD-10-CM | POA: Diagnosis present

## 2023-01-02 DIAGNOSIS — D62 Acute posthemorrhagic anemia: Secondary | ICD-10-CM | POA: Diagnosis not present

## 2023-01-02 DIAGNOSIS — O9081 Anemia of the puerperium: Secondary | ICD-10-CM | POA: Diagnosis not present

## 2023-01-02 DIAGNOSIS — O24420 Gestational diabetes mellitus in childbirth, diet controlled: Secondary | ICD-10-CM | POA: Diagnosis present

## 2023-01-02 DIAGNOSIS — Z3A31 31 weeks gestation of pregnancy: Secondary | ICD-10-CM | POA: Diagnosis not present

## 2023-01-02 DIAGNOSIS — O9832 Other infections with a predominantly sexual mode of transmission complicating childbirth: Secondary | ICD-10-CM | POA: Diagnosis present

## 2023-01-02 DIAGNOSIS — Z8249 Family history of ischemic heart disease and other diseases of the circulatory system: Secondary | ICD-10-CM | POA: Diagnosis not present

## 2023-01-02 DIAGNOSIS — O322XX0 Maternal care for transverse and oblique lie, not applicable or unspecified: Secondary | ICD-10-CM | POA: Diagnosis present

## 2023-01-02 DIAGNOSIS — K219 Gastro-esophageal reflux disease without esophagitis: Secondary | ICD-10-CM | POA: Diagnosis present

## 2023-01-02 DIAGNOSIS — O10013 Pre-existing essential hypertension complicating pregnancy, third trimester: Secondary | ICD-10-CM | POA: Diagnosis not present

## 2023-01-02 DIAGNOSIS — O9962 Diseases of the digestive system complicating childbirth: Secondary | ICD-10-CM | POA: Diagnosis present

## 2023-01-02 DIAGNOSIS — O4453 Low lying placenta with hemorrhage, third trimester: Secondary | ICD-10-CM | POA: Diagnosis present

## 2023-01-02 DIAGNOSIS — O99214 Obesity complicating childbirth: Secondary | ICD-10-CM | POA: Diagnosis present

## 2023-01-02 DIAGNOSIS — Z3A30 30 weeks gestation of pregnancy: Secondary | ICD-10-CM | POA: Diagnosis not present

## 2023-01-02 DIAGNOSIS — A6000 Herpesviral infection of urogenital system, unspecified: Secondary | ICD-10-CM | POA: Diagnosis present

## 2023-01-02 DIAGNOSIS — O4593 Premature separation of placenta, unspecified, third trimester: Secondary | ICD-10-CM | POA: Diagnosis present

## 2023-01-02 DIAGNOSIS — O99343 Other mental disorders complicating pregnancy, third trimester: Secondary | ICD-10-CM | POA: Diagnosis not present

## 2023-01-02 DIAGNOSIS — Z8616 Personal history of COVID-19: Secondary | ICD-10-CM | POA: Diagnosis not present

## 2023-01-02 DIAGNOSIS — Z79899 Other long term (current) drug therapy: Secondary | ICD-10-CM | POA: Diagnosis not present

## 2023-01-02 DIAGNOSIS — Z833 Family history of diabetes mellitus: Secondary | ICD-10-CM | POA: Diagnosis not present

## 2023-01-02 LAB — GLUCOSE, CAPILLARY
Glucose-Capillary: 140 mg/dL — ABNORMAL HIGH (ref 70–99)
Glucose-Capillary: 142 mg/dL — ABNORMAL HIGH (ref 70–99)

## 2023-01-02 LAB — TYPE AND SCREEN
ABO/RH(D): O POS
Antibody Screen: NEGATIVE

## 2023-01-02 LAB — CBC
HCT: 30 % — ABNORMAL LOW (ref 36.0–46.0)
Hemoglobin: 9.6 g/dL — ABNORMAL LOW (ref 12.0–15.0)
MCH: 27.6 pg (ref 26.0–34.0)
MCHC: 32 g/dL (ref 30.0–36.0)
MCV: 86.2 fL (ref 80.0–100.0)
Platelets: 289 10*3/uL (ref 150–400)
RBC: 3.48 MIL/uL — ABNORMAL LOW (ref 3.87–5.11)
RDW: 14.9 % (ref 11.5–15.5)
WBC: 7.6 10*3/uL (ref 4.0–10.5)
nRBC: 0 % (ref 0.0–0.2)

## 2023-01-02 MED ORDER — FAMOTIDINE 20 MG PO TABS
20.0000 mg | ORAL_TABLET | Freq: Two times a day (BID) | ORAL | Status: DC
  Administered 2023-01-03 – 2023-01-08 (×12): 20 mg via ORAL
  Filled 2023-01-02 (×12): qty 1

## 2023-01-02 MED ORDER — PANTOPRAZOLE SODIUM 40 MG PO TBEC
40.0000 mg | DELAYED_RELEASE_TABLET | Freq: Every day | ORAL | Status: DC
  Filled 2023-01-02: qty 1

## 2023-01-02 MED ORDER — ASPIRIN 81 MG PO TBEC
81.0000 mg | DELAYED_RELEASE_TABLET | Freq: Every day | ORAL | Status: DC
  Administered 2023-01-02 – 2023-01-08 (×7): 81 mg via ORAL
  Filled 2023-01-02 (×7): qty 1

## 2023-01-02 MED ORDER — BETAMETHASONE SOD PHOS & ACET 6 (3-3) MG/ML IJ SUSP
12.0000 mg | INTRAMUSCULAR | Status: AC
  Administered 2023-01-02 – 2023-01-03 (×2): 12 mg via INTRAMUSCULAR
  Filled 2023-01-02: qty 5

## 2023-01-02 MED ORDER — SODIUM CHLORIDE 0.9% FLUSH
3.0000 mL | Freq: Two times a day (BID) | INTRAVENOUS | Status: DC
  Administered 2023-01-02: 3 mL via INTRAVENOUS
  Administered 2023-01-02 – 2023-01-08 (×14): 10 mL via INTRAVENOUS

## 2023-01-02 MED ORDER — CALCIUM CARBONATE ANTACID 500 MG PO CHEW
2.0000 | CHEWABLE_TABLET | ORAL | Status: DC | PRN
  Administered 2023-01-06: 400 mg via ORAL
  Filled 2023-01-02: qty 2

## 2023-01-02 MED ORDER — PRENATAL MULTIVITAMIN CH
1.0000 | ORAL_TABLET | Freq: Every day | ORAL | Status: DC
Start: 2023-01-02 — End: 2023-01-09
  Filled 2023-01-02 (×5): qty 1

## 2023-01-02 MED ORDER — SODIUM CHLORIDE 0.9% FLUSH: 3.0000 mL | INTRAVENOUS | Status: DC | PRN

## 2023-01-02 MED ORDER — VALACYCLOVIR HCL 500 MG PO TABS
1000.0000 mg | ORAL_TABLET | Freq: Two times a day (BID) | ORAL | Status: DC
  Administered 2023-01-02 – 2023-01-03 (×2): 1000 mg via ORAL
  Filled 2023-01-02 (×3): qty 2

## 2023-01-02 MED ORDER — NIFEDIPINE ER OSMOTIC RELEASE 30 MG PO TB24
30.0000 mg | ORAL_TABLET | Freq: Two times a day (BID) | ORAL | Status: DC
  Administered 2023-01-02 – 2023-01-12 (×21): 30 mg via ORAL
  Filled 2023-01-02 (×21): qty 1

## 2023-01-02 MED ORDER — FAMOTIDINE 20 MG PO TABS: 20.0000 mg | ORAL_TABLET | Freq: Two times a day (BID) | ORAL | Status: DC

## 2023-01-02 MED ORDER — FAMOTIDINE 20 MG PO TABS
40.0000 mg | ORAL_TABLET | Freq: Every day | ORAL | Status: DC
  Administered 2023-01-02: 20 mg via ORAL
  Filled 2023-01-02: qty 2

## 2023-01-02 MED ORDER — DOCUSATE SODIUM 100 MG PO CAPS
100.0000 mg | ORAL_CAPSULE | Freq: Every day | ORAL | Status: DC
Start: 2023-01-02 — End: 2023-01-09
  Administered 2023-01-03 – 2023-01-08 (×6): 100 mg via ORAL
  Filled 2023-01-02 (×7): qty 1

## 2023-01-02 MED ORDER — ACETAMINOPHEN 325 MG PO TABS
650.0000 mg | ORAL_TABLET | ORAL | Status: DC | PRN
  Administered 2023-01-02 – 2023-01-07 (×5): 650 mg via ORAL
  Filled 2023-01-02 (×5): qty 2

## 2023-01-02 MED ORDER — SERTRALINE HCL 50 MG PO TABS
100.0000 mg | ORAL_TABLET | Freq: Every day | ORAL | Status: DC
  Administered 2023-01-02 – 2023-01-08 (×7): 100 mg via ORAL
  Filled 2023-01-02 (×7): qty 2

## 2023-01-02 MED ORDER — LABETALOL HCL 200 MG PO TABS
400.0000 mg | ORAL_TABLET | Freq: Two times a day (BID) | ORAL | Status: DC
  Administered 2023-01-02 – 2023-01-08 (×14): 400 mg via ORAL
  Filled 2023-01-02 (×14): qty 2

## 2023-01-02 NOTE — MAU Note (Signed)
Pt has continued to have no further vaginal bleeding on peri pad or after urination

## 2023-01-02 NOTE — H&P (Signed)
Diane Lloyd is a 32 y.o. female presenting for vaginal bleeding in setting of low lying placenta.  Patient states she gets her care in [place] and is here visiting family.  She reports at 1030pm she noted blood in her underwear and in the toilet with urination. She states it was bright red and she experienced cramping shortly before onset.  Patient denies urinary issues.   Pregnancy/Medical Problems: CHTN on Labetalol 400mg  BID & Procardia 30mg  BID HSV: Valtrex Anxiety Low Lying Placenta GDM: Failed 3 hr OB History     Gravida  1   Para      Term      Preterm      AB      Living         SAB      IAB      Ectopic      Multiple      Live Births             Past Medical History:  Diagnosis Date   Anemia    iron def   Anxiety    Asthma    Diabetes mellitus without complication (HCC)    Gestational diabetes    Hypertension    Obesity, unspecified    PCOS (polycystic ovarian syndrome)    Positive ANA (antinuclear antibody) 05/05/2017   done by [facility] in [place]; ANA 1:160, speckled pattern   Past Surgical History:  Procedure Laterality Date   TONSILLECTOMY     Family History: family history includes Anxiety disorder in her cousin and paternal aunt; Asthma in her father; Cancer (age of onset: 44) in her maternal uncle; Cancer (age of onset: 36) in her father; Cancer (age of onset: 53) in her paternal grandmother; Depression in her paternal aunt; Diabetes in her mother; Esophageal cancer in her father; Hypertension in her mother; Lupus in her mother; Migraines in her mother. Social History:  reports that she has never smoked. She has never used smokeless tobacco. She reports current alcohol use. She reports that she does not currently use drugs.     Maternal Diabetes: Yes:  Diabetes Type:  Diet controlled Genetic Screening: Normal Maternal Ultrasounds/Referrals: Normal Fetal Ultrasounds or other Referrals:  None Maternal Substance Abuse:   No Significant Maternal Medications:  Meds include: Other:  Significant Maternal Lab Results:  None Number of Prenatal Visits:greater than 3 verified prenatal visits Maternal Vaccinations:Unknown Other Comments:  None  Review of Systems  Gastrointestinal:  Positive for abdominal pain (Cramping). Negative for diarrhea, nausea and vomiting.  Genitourinary:  Positive for vaginal bleeding and vaginal discharge. Negative for difficulty urinating and dysuria.   Maternal Medical History:  Reason for admission: Vaginal bleeding.  Nausea.  Contractions: Onset was 1-2 hours ago.   Frequency: irregular.   Fetal activity: Perceived fetal activity is normal.   Last perceived fetal movement was within the past hour.   Prenatal complications: PIH.   Prenatal Complications - Diabetes: gestational. Diabetes is managed by diet.       Blood pressure (!) 119/53, pulse 93, temperature 99 F (37.2 C), resp. rate 17, last menstrual period 06/03/2022, SpO2 97%. Maternal Exam:  Uterine Assessment: Contraction strength is mild.  Abdomen: Patient reports no abdominal tenderness. Fetal presentation: vertex Introitus: Normal vulva. Normal vagina.  Ferning test: not done.  Nitrazine test: not done. Cervix: Cervix evaluated by sterile speculum exam.     Fetal Exam Fetal Monitor Review: Mode: ultrasound.   Baseline rate: 145.  Variability: moderate (6-25  bpm).   Pattern: no decelerations and no accelerations.   Fetal State Assessment: Category I - tracings are normal.   Physical Exam Genitourinary:    General: Normal vulva.     Prenatal labs: ABO, Rh:  O Positive Antibody:  Negative Rubella:  Immune RPR:   NR HBsAg:   Negative HIV:   NR GBS:   None  Assessment/Plan: SIUP at 30.3 weeks Cat I FT Vaginal Bleeding Low Lying Placenta Hypertension GDM    -Discussed admission for monitoring of vaginal bleeding. -Dr. Donavan Lloyd to place admission orders.   Diane Robins MSN, CNM Advanced  Practice Provider, [facility] 01/02/2023, 2:21 AM

## 2023-01-03 DIAGNOSIS — Z3A30 30 weeks gestation of pregnancy: Secondary | ICD-10-CM

## 2023-01-03 DIAGNOSIS — O99343 Other mental disorders complicating pregnancy, third trimester: Secondary | ICD-10-CM

## 2023-01-03 DIAGNOSIS — O4443 Low lying placenta NOS or without hemorrhage, third trimester: Secondary | ICD-10-CM

## 2023-01-03 DIAGNOSIS — O321XX0 Maternal care for breech presentation, not applicable or unspecified: Secondary | ICD-10-CM

## 2023-01-03 DIAGNOSIS — O10013 Pre-existing essential hypertension complicating pregnancy, third trimester: Secondary | ICD-10-CM

## 2023-01-03 DIAGNOSIS — O4593 Premature separation of placenta, unspecified, third trimester: Secondary | ICD-10-CM

## 2023-01-03 LAB — GLUCOSE, CAPILLARY
Glucose-Capillary: 153 mg/dL — ABNORMAL HIGH (ref 70–99)
Glucose-Capillary: 113 mg/dL — ABNORMAL HIGH (ref 70–99)

## 2023-01-03 MED ORDER — VALACYCLOVIR HCL 500 MG PO TABS
1000.0000 mg | ORAL_TABLET | Freq: Every day | ORAL | Status: DC
  Administered 2023-01-04 – 2023-01-05 (×2): 1000 mg via ORAL
  Filled 2023-01-03 (×2): qty 2

## 2023-01-03 NOTE — Progress Notes (Signed)
FACULTY PRACTICE ANTEPARTUM PROGRESS NOTE  Diane Lloyd is a 32 y.o. G1P0 at [redacted]w[redacted]d who is admitted for vaginal bleeding in the third trimester.  Estimated Date of Delivery: 03/10/23 Fetal presentation is breech.  Length of Stay:  1 Days. Admitted 01/01/2023  Subjective: Pt is doing well.  No acute complaints.   Patient reports normal fetal movement.  She denies uterine contractions, denies bleeding and leaking of fluid per vagina. Pt noted old blood "smears" when wiping.  No active bleeding at this time.  Vitals:  Blood pressure (!) 106/48, pulse 90, temperature 98.6 F (37 C), temperature source Oral, resp. rate 18, last menstrual period 06/03/2022, SpO2 99%. Physical Examination: CONSTITUTIONAL: Well-developed, well-nourished female in no acute distress.  HENT:  Normocephalic, atraumatic, External right and left ear normal. Oropharynx is clear and moist EYES: Conjunctivae and EOM are normal. NECK: Normal range of motion, supple, no masses. SKIN: Skin is warm and dry. No rash noted. Not diaphoretic. No erythema. No pallor. NEUROLGIC: Alert and oriented to person, place, and time. Normal reflexes, muscle tone coordination. No cranial nerve deficit noted. PSYCHIATRIC: Normal mood and affect. Normal behavior. Normal judgment and thought content. CARDIOVASCULAR: Normal heart rate noted, regular rhythm RESPIRATORY: Effort and breath sounds normal, no problems with respiration noted MUSCULOSKELETAL: Normal range of motion. No edema and no tenderness. ABDOMEN: Soft, nontender, nondistended, gravid. CERVIX: deferred  Fetal monitoring: FHR: 140s bpm, Variability: moderate, Accelerations: Present, Decelerations: Absent  Uterine activity: none   Results for orders placed or performed during the hospital encounter of 01/01/23 (from the past 48 hours)  Urinalysis, Routine w reflex microscopic -Urine, Clean Catch     Status: Abnormal   Collection Time: 01/01/23 10:24 PM  Result Value Ref  Range   Color, Urine STRAW (A) YELLOW   APPearance CLEAR CLEAR   Specific Gravity, Urine 1.002 (L) 1.005 - 1.030   pH 7.0 5.0 - 8.0   Glucose, UA NEGATIVE NEGATIVE mg/dL   Hgb urine dipstick LARGE (A) NEGATIVE   Bilirubin Urine NEGATIVE NEGATIVE   Ketones, ur NEGATIVE NEGATIVE mg/dL   Protein, ur NEGATIVE NEGATIVE mg/dL   Nitrite NEGATIVE NEGATIVE   Leukocytes,Ua NEGATIVE NEGATIVE   RBC / HPF 0-5 0 - 5 RBC/hpf   WBC, UA 0-5 0 - 5 WBC/hpf   Bacteria, UA RARE (A) NONE SEEN   Squamous Epithelial / HPF 0-5 0 - 5 /HPF    Comment: Performed at [facility], [address], [place], [place] [address]  Wet prep, genital     Status: None   Collection Time: 01/01/23 11:39 PM  Result Value Ref Range   Yeast Wet Prep HPF POC NONE SEEN NONE SEEN   Trich, Wet Prep NONE SEEN NONE SEEN   Clue Cells Wet Prep HPF POC NONE SEEN NONE SEEN   WBC, Wet Prep HPF POC <10 <10   Sperm NONE SEEN     Comment: Performed at [facility], [address] [place], [place] [address]  CBC     Status: Abnormal   Collection Time: 01/02/23  1:48 AM  Result Value Ref Range   WBC 7.6 4.0 - 10.5 K/uL   RBC 3.48 (L) 3.87 - 5.11 MIL/uL   Hemoglobin 9.6 (L) 12.0 - 15.0 g/dL   HCT 02.7 (L) 25.3 - 66.4 %   MCV 86.2 80.0 - 100.0 fL   MCH 27.6 26.0 - 34.0 pg   MCHC 32.0 30.0 - 36.0 g/dL   RDW 40.3 47.4 -  15.5 %   Platelets 289 150 - 400 K/uL   nRBC 0.0 0.0 - 0.2 %    Comment: Performed at [facility], [address], [place], [place] [address]  Type and screen     Status: None   Collection Time: 01/02/23  2:29 AM  Result Value Ref Range   ABO/RH(D) O POS    Antibody Screen NEG    Sample Expiration      01/05/2023,2359 Performed at [facility], [address] [place], [place] [address]   Glucose, capillary     Status: Abnormal   Collection Time: 01/02/23 10:57 AM  Result Value Ref Range   Glucose-Capillary 140 (H) 70 - 99 mg/dL    Comment: Glucose reference range applies only to  samples taken after fasting for at least 8 hours.  Glucose, capillary     Status: Abnormal   Collection Time: 01/02/23  6:22 PM  Result Value Ref Range   Glucose-Capillary 142 (H) 70 - 99 mg/dL    Comment: Glucose reference range applies only to samples taken after fasting for at least 8 hours.    I have reviewed the patient's current medications.  ASSESSMENT: Principal Problem:   Vaginal bleeding in pregnancy, third trimester   PLAN: Pt is s/p BMZ x 2. No return of bleeding CHTN well controlled with home meds. Due to third trimester bleeding, in house for 4-7 days   Continue routine antenatal care.   Mariel Aloe, MD [facility] Faculty Attending, Center for [facility] 01/03/2023 7:57 AM

## 2023-01-04 LAB — GLUCOSE, CAPILLARY
Glucose-Capillary: 90 mg/dL (ref 70–99)
Glucose-Capillary: 116 mg/dL — ABNORMAL HIGH (ref 70–99)
Glucose-Capillary: 102 mg/dL — ABNORMAL HIGH (ref 70–99)

## 2023-01-04 NOTE — Progress Notes (Signed)
Patient ID: Diane Lloyd, female   DOB: April 19, 1990, 32 y.o.   MRN: [id number] FACULTY PRACTICE ANTEPARTUM PROGRESS NOTE  Diane Lloyd is a 32 y.o. G1P0 at [redacted]w[redacted]d who is admitted for vaginal bleeding in the third trimester.  Estimated Date of Delivery: 03/10/23 Fetal presentation is breech.  Length of Stay:  2 Days. Admitted 01/01/2023  Subjective: Pt is doing well and reports no episode of vaginal bleeding overnight.  Patient reports normal fetal movement.  She denies uterine contractions, and leaking of fluid per vagina.   Vitals:  Blood pressure (!) 100/49, pulse 90, temperature 98.5 F (36.9 C), temperature source Oral, resp. rate 18, last menstrual period 06/03/2022, SpO2 99%. Physical Examination: CONSTITUTIONAL: Well-developed, well-nourished female in no acute distress.  NEUROLGIC: Alert and oriented to person, place, and time. Normal reflexes, muscle tone coordination. No cranial nerve deficit noted. PSYCHIATRIC: Normal mood and affect. Normal behavior. Normal judgment and thought content. CARDIOVASCULAR: Normal heart rate noted, regular rhythm RESPIRATORY: Effort and breath sounds normal, no problems with respiration noted MUSCULOSKELETAL: Normal range of motion. No edema and no tenderness. ABDOMEN: Soft, nontender, nondistended, gravid. CERVIX: deferred  Fetal monitoring: FHR: 145s bpm, Variability: moderate, Accelerations: Present, Decelerations: Absent  Uterine activity: none   Results for orders placed or performed during the hospital encounter of 01/01/23 (from the past 48 hours)  Glucose, capillary     Status: Abnormal   Collection Time: 01/02/23 10:57 AM  Result Value Ref Range   Glucose-Capillary 140 (H) 70 - 99 mg/dL    Comment: Glucose reference range applies only to samples taken after fasting for at least 8 hours.  Glucose, capillary     Status: Abnormal   Collection Time: 01/02/23  6:22 PM  Result Value Ref Range   Glucose-Capillary 142 (H) 70 - 99 mg/dL     Comment: Glucose reference range applies only to samples taken after fasting for at least 8 hours.  Glucose, capillary     Status: Abnormal   Collection Time: 01/03/23 10:45 AM  Result Value Ref Range   Glucose-Capillary 153 (H) 70 - 99 mg/dL    Comment: Glucose reference range applies only to samples taken after fasting for at least 8 hours.  Glucose, capillary     Status: Abnormal   Collection Time: 01/03/23  8:39 PM  Result Value Ref Range   Glucose-Capillary 113 (H) 70 - 99 mg/dL    Comment: Glucose reference range applies only to samples taken after fasting for at least 8 hours.    I have reviewed the patient's current medications.  ASSESSMENT: Principal Problem:   Vaginal bleeding in pregnancy, third trimester   PLAN: Patient completed a course of BMZ. CHTN well controlled with home meds. Continue inpatient monitoring due to third trimester bleeding for a total of 7 days Continue routine antenatal care.   Catalina Antigua, MD [facility] Faculty Attending, Center for [facility] 01/04/2023 9:51 AM

## 2023-01-05 ENCOUNTER — Encounter (HOSPITAL_COMMUNITY): Payer: Self-pay | Admitting: Obstetrics and Gynecology

## 2023-01-05 DIAGNOSIS — Z3A30 30 weeks gestation of pregnancy: Secondary | ICD-10-CM

## 2023-01-05 DIAGNOSIS — A609 Anogenital herpesviral infection, unspecified: Secondary | ICD-10-CM | POA: Diagnosis present

## 2023-01-05 DIAGNOSIS — O4693 Antepartum hemorrhage, unspecified, third trimester: Secondary | ICD-10-CM

## 2023-01-05 LAB — GLUCOSE, CAPILLARY
Glucose-Capillary: 82 mg/dL (ref 70–99)
Glucose-Capillary: 102 mg/dL — ABNORMAL HIGH (ref 70–99)

## 2023-01-05 MED ORDER — VALACYCLOVIR HCL 500 MG PO TABS
500.0000 mg | ORAL_TABLET | Freq: Two times a day (BID) | ORAL | Status: DC
  Administered 2023-01-06: 500 mg via ORAL
  Filled 2023-01-05: qty 1

## 2023-01-05 NOTE — Progress Notes (Signed)
FACULTY PRACTICE ANTEPARTUM(COMPREHENSIVE) NOTE  Diane Lloyd is a 32 y.o. G1P0 with Estimated Date of Delivery: 03/10/23   By  LMP [redacted]w[redacted]d  who is admitted for vaginal bleeding with low lying placenta.    Fetal presentation is breech. Length of Stay:  3  Days   Subjective: Pt still noting brown spotting when she wipes- better than previous.  Denies BRB.  No acute complaints overnight.  Patient reports the fetal movement as active. Patient reports uterine contraction  activity as none. Patient describes fluid per vagina as None.  Vitals:  Blood pressure 123/61, pulse 85, temperature 97.7 F (36.5 C), temperature source Oral, resp. rate 18, last menstrual period 06/03/2022, SpO2 100%. Vitals:   01/04/23 1558 01/04/23 1955 01/04/23 2226 01/05/23 0418  BP: 118/60 125/67 (!) 115/58 123/61  Pulse: 100 91 91 85  Resp: 18 16 18 18   Temp: 98.5 F (36.9 C) 98.4 F (36.9 C)  97.7 F (36.5 C)  TempSrc: Oral Oral  Oral  SpO2: 99% 98%  100%   Physical Examination:  General appearance - alert, well appearing, and in no distress Mental status - normal mood, behavior, speech, dress, motor activity, and thought processes Chest - clear to auscultation, no wheezes, rales or rhonchi, symmetric air entry Heart - normal rate and regular rhythm Abdomen - gravid, obese, soft and non-tender Musculoskeletal - no calf tenderness bilaterally Extremities - no pedal edema noted Skin - warm and dry   Fetal Monitoring:  Baseline: 140 bpm, Variability: moderate, Accelerations: +10x10 accels, and Decelerations: Absent    Reactive for current gestational age  Labs:  Results for orders placed or performed during the hospital encounter of 01/01/23 (from the past 24 hours)  Glucose, capillary   Collection Time: 01/04/23 11:36 AM  Result Value Ref Range   Glucose-Capillary 90 70 - 99 mg/dL  Glucose, capillary   Collection Time: 01/04/23  4:49 PM  Result Value Ref Range   Glucose-Capillary 116 (H) 70 -  99 mg/dL  Glucose, capillary   Collection Time: 01/04/23  8:21 PM  Result Value Ref Range   Glucose-Capillary 102 (H) 70 - 99 mg/dL    Imaging Studies:    [place] MFM OB LIMITED Result Date: 01/02/2023 ----------------------------------------------------------------------  OBSTETRICS REPORT                        (Signed Final 01/02/2023 08:23 am) ---------------------------------------------------------------------- Patient Info  ID #:       [id number]                          D.O.B.:  September 13, 1990 (32 yrs)(F)  Name:       Diane Lloyd                Visit Date: 01/02/2023 12:09 am ---------------------------------------------------------------------- Performed By  Attending:        Noralee Space MD        Referred By:       [facility] MAU/Triage  Performed By:     Earley Brooke     [facility] ---------------------------------------------------------------------- Orders  #  Description                           Code        Ordered By  1  [place] MFM OB LIMITED                     [id number]    JESSICA EMLY ----------------------------------------------------------------------  #  Order #                     Accession #                Episode #  1  [id number]                   [id number]                 [id number] ---------------------------------------------------------------------- Indications  Vaginal bleeding in pregnancy, third trimester  O46.93  Low lying placenta, antepartum                  O44.40  [redacted] weeks gestation of pregnancy                 Z3A.30  Pelvic pain affecting pregnancy in third        O26.893  trimester ---------------------------------------------------------------------- Fetal Evaluation  Num Of Fetuses:          1  Fetal Heart Rate(bpm):   148  Cardiac Activity:        Observed  Presentation:            Breech  Placenta:                Posterior, low-lying, 0.8cm from int os  P. Cord Insertion:        Visualized  Amniotic Fluid  AFI FV:      Subjectively upper-normal  AFI Sum(cm)     %Tile       Largest Pocket(cm)  23.7            95          6.5  RUQ(cm)       RLQ(cm)       LUQ(cm)        LLQ(cm)  6.5           5.8           5.3            6.1  Comment:    No placental abruption identified with ultrasound. ---------------------------------------------------------------------- OB History  Gravidity:    1         Term:   0        Prem:   0        SAB:   0  TOP:          0       Ectopic:  0        Living: 0 ---------------------------------------------------------------------- Gestational Age  LMP:           30w 3d        Date:  06/03/22                  EDD:   03/10/23  Best:          [name] 3d     Det. By:  LMP  (06/03/22)          EDD:  03/10/23 ---------------------------------------------------------------------- Cervix Uterus Adnexa  Cervix  Normal appearance by transabdominal scan  Right Ovary  Within normal limits.  Left Ovary  Within normal limits.  Adnexa  No abnormality visualized ---------------------------------------------------------------------- Impression  Patient presented to the MAU with c/o vaginal bleeding. She  has been having her prenatal care at a center in [place].  Known placenta previa or low-lying placenta.  A limited ultrasound study was performed. Amniotic fluid is  normal and good fetal activity is seen. On transabdominal  scan, the placenta is placenta is posterior and low lying. ---------------------------------------------------------------------- Recommendations  -If patient is not discharged on Monday, I recommend  transvaginal ultrasound (with patient's permission). ----------------------------------------------------------------------                  Noralee Space, MD Electronically Signed Final Report   01/02/2023 08:23 am ----------------------------------------------------------------------     Medications:  Scheduled  aspirin EC  81 mg Oral Daily   docusate sodium  100 mg  Oral Daily   famotidine  20 mg Oral BID   labetalol  400 mg Oral BID   NIFEdipine  30 mg Oral BID   prenatal multivitamin  1 tablet Oral Q1200   sertraline  100 mg Oral Daily   sodium chloride flush  3-10 mL Intravenous Q12H   valACYclovir  1,000 mg Oral Daily   I have reviewed the patient's current medications.  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  Alessandra Bevels Hodge Stachnik 01/05/2023,7:19 AM

## 2023-01-05 NOTE — Progress Notes (Signed)
RN to patient room to get her 2 hrPP glucose check at 1845 and she had already had her dinner brought by family at 1800. Patient instructed that she will get her 2hrPP check at 8pm.

## 2023-01-06 DIAGNOSIS — Z3A31 31 weeks gestation of pregnancy: Secondary | ICD-10-CM

## 2023-01-06 LAB — GLUCOSE, CAPILLARY
Glucose-Capillary: 118 mg/dL — ABNORMAL HIGH (ref 70–99)
Glucose-Capillary: 126 mg/dL — ABNORMAL HIGH (ref 70–99)

## 2023-01-06 MED ORDER — VALACYCLOVIR HCL 500 MG PO TABS
1000.0000 mg | ORAL_TABLET | Freq: Every day | ORAL | Status: DC
  Administered 2023-01-07 – 2023-01-08 (×2): 1000 mg via ORAL
  Filled 2023-01-06 (×2): qty 2

## 2023-01-06 MED ORDER — HYDROXYZINE HCL 50 MG PO TABS
25.0000 mg | ORAL_TABLET | Freq: Three times a day (TID) | ORAL | Status: DC | PRN
  Administered 2023-01-06: 25 mg via ORAL
  Filled 2023-01-06 (×2): qty 1

## 2023-01-06 MED ORDER — VALACYCLOVIR HCL 500 MG PO TABS
500.0000 mg | ORAL_TABLET | Freq: Once | ORAL | Status: AC
  Administered 2023-01-06: 500 mg via ORAL
  Filled 2023-01-06: qty 1

## 2023-01-06 NOTE — Progress Notes (Signed)
FACULTY PRACTICE ANTEPARTUM(COMPREHENSIVE) NOTE  Diane Lloyd is a 32 y.o. G1P0 with Estimated Date of Delivery: 03/10/23   By  LMP [redacted]w[redacted]d  who is admitted for vaginal bleeding with low-lying placenta.    Fetal presentation is breech. Length of Stay:  4  Days  Date of admission:01/01/2023  Subjective: Patient had noted some chest discomfort overnight, this morning it has completely resolved.  She denies chest pain, shortness of breath. Patient reports the fetal movement as active. Patient reports uterine contraction  activity as none. Patient reports  vaginal bleeding as  occasional brown spotting when she wipes but not every time .  This is improved since yesterday Patient describes fluid per vagina as None.  Vitals:  Blood pressure 131/64, pulse 82, temperature (!) 97.4 F (36.3 C), temperature source Oral, resp. rate 18, last menstrual period 06/03/2022, SpO2 96%. Vitals:   01/06/23 0500 01/06/23 0530 01/06/23 0600 01/06/23 0630  BP:      Pulse:      Resp:      Temp:      TempSrc:      SpO2: 97% 96% 96% 96%   Physical Examination:  General appearance - alert, well appearing, and in no distress Mental status - normal mood, behavior, speech, dress, motor activity, and thought processes Heart - normal rate and regular rhythm Lungs: CTAB Abdomen -obese, gravid, nontender no rebound no guarding Musculoskeletal -no calf tenderness bilaterally Extremities - no pedal edema noted Skin -warm and dry   Fetal Monitoring:  Baseline: 140 bpm, Variability: moderate, Accelerations: +10x10 accels, and Decelerations: Absent    Reactive-appropriate for current gestational age  Labs:  Results for orders placed or performed during the hospital encounter of 01/01/23 (from the past 24 hours)  Glucose, capillary   Collection Time: 01/05/23 11:19 AM  Result Value Ref Range   Glucose-Capillary 82 70 - 99 mg/dL  Glucose, capillary   Collection Time: 01/05/23  8:15 PM  Result Value Ref Range    Glucose-Capillary 102 (H) 70 - 99 mg/dL    Imaging Studies:    No results found.   Medications:  Scheduled  aspirin EC  81 mg Oral Daily   docusate sodium  100 mg Oral Daily   famotidine  20 mg Oral BID   labetalol  400 mg Oral BID   NIFEdipine  30 mg Oral BID   prenatal multivitamin  1 tablet Oral Q1200   sertraline  100 mg Oral Daily   sodium chloride flush  3-10 mL Intravenous Q12H   valACYclovir  500 mg Oral Q12H   I have reviewed the patient's current medications.  ASSESSMENT: G1P0 [redacted]w[redacted]d Estimated Date of Delivery: 03/10/23  -Vaginal bleeding in third trimester -Low lying placenta -Chronic HTN -Anemia in pregnancy -Obesity -GDMA1  PLAN:  HD #4 1) vaginal bleeding with low-lying placenta -Spotting continues to improve -S/p betamethasone  2) Maternal care -GDMA1-sugars remain stable without medication -Chronic HTN-overall stable with current medicine continue labetalol 400 mg twice daily and Procardia 30 twice daily -GERD-Pepcid scheduled and Tums as needed -Depression/anxiety: Continue Zoloft 100 mg daily and added hydroxyzine TID Prn -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    Myna Hidalgo, DO Attending Obstetrician & Gynecologist, [facility] for [employer], [facility]

## 2023-01-07 DIAGNOSIS — Z3A31 31 weeks gestation of pregnancy: Secondary | ICD-10-CM

## 2023-01-07 LAB — GLUCOSE, CAPILLARY
Glucose-Capillary: 85 mg/dL (ref 70–99)
Glucose-Capillary: 102 mg/dL — ABNORMAL HIGH (ref 70–99)

## 2023-01-07 NOTE — Progress Notes (Signed)
Patient ID: Diane Lloyd, female   DOB: 24-Jun-1990, 32 y.o.   MRN: [id number] FACULTY PRACTICE ANTEPARTUM(COMPREHENSIVE) NOTE  Diane Lloyd is a 32 y.o. G1P0 at [redacted]w[redacted]d by LMP who is admitted for vaginal bleeding with low-lying placenta.  Fetal presentation is breech. Length of Stay:  5  Days  Subjective:  Patient reports the fetal movement as active. Patient reports uterine contraction  activity as none. Patient reports  vaginal bleeding as scant staining. Patient describes fluid per vagina as None.  Vitals:  Blood pressure 138/72, pulse 86, temperature 98.7 F (37.1 C), temperature source Oral, resp. rate 17, last menstrual period 06/03/2022, SpO2 100%. Physical Examination:  General appearance - alert, well appearing, and in no distress Heart - normal rate and regular rhythm Abdomen - soft, nontender, nondistended Fundal Height:  size equals dates . Extremities: extremities normal, atraumatic, no cyanosis or edema and Homans sign is negative, no sign of DVT with DTRs 2+ bilaterally Membranes:intact  Fetal Monitoring:   Fetal Heart Rate A   Mode External  [applied] filed at 01/07/2023 1001  Baseline Rate (A) 145 bpm filed at 01/06/2023 2204  Variability 6-25 BPM, <5 BPM filed at 01/06/2023 2204  Accelerations 10 x 10 filed at 01/06/2023 2204  Decelerations None filed at 01/06/2023 2204    Labs:  Results for orders placed or performed during the hospital encounter of 01/01/23 (from the past 24 hours)  Glucose, capillary   Collection Time: 01/06/23 11:18 AM  Result Value Ref Range   Glucose-Capillary 118 (H) 70 - 99 mg/dL  Glucose, capillary   Collection Time: 01/06/23  6:12 PM  Result Value Ref Range   Glucose-Capillary 126 (H) 70 - 99 mg/dL     Medications:  Scheduled  aspirin EC  81 mg Oral Daily   docusate sodium  100 mg Oral Daily   famotidine  20 mg Oral BID   labetalol  400 mg Oral BID   NIFEdipine  30 mg Oral BID   prenatal multivitamin  1 tablet Oral  Q1200   sertraline  100 mg Oral Daily   sodium chloride flush  3-10 mL Intravenous Q12H   valACYclovir  1,000 mg Oral Daily   I have reviewed the patient's current medications.  ASSESSMENT: Patient Active Problem List   Diagnosis Date Noted   HSV (herpes simplex virus) anogenital infection 01/05/2023   Vaginal bleeding in pregnancy, third trimester 01/02/2023   Chronic nonintractable headache 04/09/2019   Generalized anxiety disorder with panic attacks 06/20/2018   Allergic rhinitis 05/17/2013   GERD (gastroesophageal reflux disease) 03/20/2012   Polycystic ovarian syndrome 12/26/2011   Family history of cardiac disorder 02/17/2011   Depression, major, single episode, 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/23/2010   Essential hypertension 01/27/2010   ANEMIA, IRON DEFICIENCY 06/27/2009   Obesity 03/10/2006   ASTHMA, INTERMITTENT 03/10/2006   ECZEMA, ATOPIC DERMATITIS 03/10/2006    PLAN: 1) vaginal bleeding with low-lying placenta -Spotting continues to improve -S/p betamethasone   2) Maternal care -GDMA1-sugars remain stable without medication -Chronic HTN-overall stable with current medicine continue labetalol 400 mg twice daily and Procardia 30 twice daily   3) Fetal well being -Reactive NST for current gestational age -Continue daily monitoring   Continue routine OB care  Scheryl Darter 01/07/2023,10:18 AM

## 2023-01-07 NOTE — Plan of Care (Signed)
  Problem: Education: Goal: Knowledge of disease or condition will improve Outcome: Progressing Goal: Knowledge of the prescribed therapeutic regimen will improve Outcome: Progressing   Problem: Clinical Measurements: Goal: Complications related to the disease process, condition or treatment will be avoided or minimized Outcome: Progressing   Problem: Health Behavior/Discharge Planning: Goal: Ability to manage health-related needs will improve Outcome: Progressing   Problem: Clinical Measurements: Goal: Ability to maintain clinical measurements within normal limits will improve Outcome: Progressing Goal: Will remain free from infection Outcome: Progressing Goal: Diagnostic test results will improve Outcome: Progressing   Problem: Elimination: Goal: Will not experience complications related to bowel motility Outcome: Progressing   Problem: Pain Management: Goal: General experience of comfort will improve Outcome: Progressing   Problem: Safety: Goal: Ability to remain free from injury will improve Outcome: Progressing   Problem: Skin Integrity: Goal: Risk for impaired skin integrity will decrease Outcome: Progressing

## 2023-01-08 ENCOUNTER — Encounter (HOSPITAL_COMMUNITY): Payer: Self-pay | Admitting: Obstetrics and Gynecology

## 2023-01-08 LAB — GLUCOSE, CAPILLARY
Glucose-Capillary: 86 mg/dL (ref 70–99)
Glucose-Capillary: 146 mg/dL — ABNORMAL HIGH (ref 70–99)
Glucose-Capillary: 110 mg/dL — ABNORMAL HIGH (ref 70–99)
Glucose-Capillary: 106 mg/dL — ABNORMAL HIGH (ref 70–99)

## 2023-01-08 MED ORDER — SOD CITRATE-CITRIC ACID 500-334 MG/5ML PO SOLN
ORAL | Status: AC
  Filled 2023-01-08: qty 30

## 2023-01-08 MED ORDER — OXYTOCIN-SODIUM CHLORIDE 30-0.9 UT/500ML-% IV SOLN
INTRAVENOUS | Status: AC
Start: 2023-01-08 — End: ?
  Filled 2023-01-08: qty 500

## 2023-01-08 MED ORDER — MORPHINE SULFATE (PF) 0.5 MG/ML IJ SOLN
INTRAMUSCULAR | Status: AC
  Filled 2023-01-08: qty 10

## 2023-01-08 MED ORDER — TRANEXAMIC ACID-NACL 1000-0.7 MG/100ML-% IV SOLN
INTRAVENOUS | Status: AC
  Filled 2023-01-08: qty 100

## 2023-01-08 MED ORDER — FENTANYL CITRATE (PF) 100 MCG/2ML IJ SOLN
INTRAMUSCULAR | Status: AC
  Filled 2023-01-08: qty 2

## 2023-01-08 MED ORDER — ONDANSETRON HCL 4 MG/2ML IJ SOLN
INTRAMUSCULAR | Status: AC
  Filled 2023-01-08: qty 2

## 2023-01-08 MED ORDER — ACETAMINOPHEN 10 MG/ML IV SOLN
INTRAVENOUS | Status: AC
Start: 2023-01-08 — End: ?
  Filled 2023-01-08: qty 200

## 2023-01-08 MED ORDER — DEXAMETHASONE SODIUM PHOSPHATE 10 MG/ML IJ SOLN
INTRAMUSCULAR | Status: AC
Start: 2023-01-08 — End: ?
  Filled 2023-01-08: qty 1

## 2023-01-08 NOTE — Progress Notes (Signed)
Patient ID: Diane Lloyd, female   DOB: [birth year], 32 y.o.   MRN: [id number] FACULTY PRACTICE ANTEPARTUM(COMPREHENSIVE) NOTE  Diane Lloyd is a 32 y.o. G1P0 at [redacted]w[redacted]d by LMP who is admitted for vaginal bleeding with LLP.   Fetal presentation is breech Length of Stay:  6  Days  Subjective:  Patient reports the fetal movement as active. Patient reports uterine contraction  activity as none. Patient reports  vaginal bleeding as none. Patient describes fluid per vagina as None.  Vitals:  Blood pressure 127/61, pulse 94, temperature 98.8 F (37.1 C), temperature source Oral, resp. rate 18, last menstrual period 06/03/2022, SpO2 100%. Physical Examination:  General appearance - alert, well appearing, and in no distress Heart - normal rate and regular rhythm Abdomen - soft, nontender, nondistended Fundal Height:  size equals dates Extremities: extremities normal, atraumatic, no cyanosis or edema and Homans sign is negative, no sign of DVT Membranes:intact  Fetal Monitoring:   Fetal Heart Rate A   Mode External filed at 01/07/2023 2155  Baseline Rate (A) 150 bpm filed at 01/07/2023 2155  Variability 6-25 BPM filed at 01/07/2023 2155  Accelerations 10 x 10 filed at 01/07/2023 2155  Decelerations None filed at 01/07/2023 2155    Labs:  Results for orders placed or performed during the hospital encounter of 01/01/23 (from the past 24 hours)  Glucose, capillary   Collection Time: 01/07/23  2:36 PM  Result Value Ref Range   Glucose-Capillary 85 70 - 99 mg/dL  Glucose, capillary   Collection Time: 01/07/23  8:49 PM  Result Value Ref Range   Glucose-Capillary 102 (H) 70 - 99 mg/dL  Glucose, capillary   Collection Time: 01/08/23  9:00 AM  Result Value Ref Range   Glucose-Capillary 86 70 - 99 mg/dL     Medications:  Scheduled  aspirin EC  81 mg Oral Daily   docusate sodium  100 mg Oral Daily   famotidine  20 mg Oral BID   labetalol  400 mg Oral BID   NIFEdipine  30 mg Oral  BID   prenatal multivitamin  1 tablet Oral Q1200   sertraline  100 mg Oral Daily   sodium chloride flush  3-10 mL Intravenous Q12H   valACYclovir  1,000 mg Oral Daily   I have reviewed the patient's current medications.  ASSESSMENT: Patient Active Problem List   Diagnosis Date Noted   HSV (herpes simplex virus) anogenital infection 01/05/2023   Vaginal bleeding in pregnancy, third trimester 01/02/2023   Chronic nonintractable headache 04/09/2019   Generalized anxiety disorder with panic attacks 06/20/2018   Allergic rhinitis 05/17/2013   GERD (gastroesophageal reflux disease) 03/20/2012   Polycystic ovarian syndrome 12/26/2011   Family history of cardiac disorder 02/17/2011   Depression, major, single episode, 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/23/2010   Essential hypertension 01/27/2010   ANEMIA, IRON DEFICIENCY 06/27/2009   Obesity 03/10/2006   ASTHMA, INTERMITTENT 03/10/2006   ECZEMA, ATOPIC DERMATITIS 03/10/2006    PLAN:  1) vaginal bleeding with low-lying placenta -Spotting continues to improve -S/p betamethasone   2) Maternal care -GDMA1-sugars remain stable without medication -Chronic HTN-overall stable with current medicine continue labetalol 400 mg twice daily and Procardia 30 twice daily   3) Fetal well being -Reactive NST for current gestational age -Continue daily monitoring   Continue routine OB care   Scheryl Darter 01/08/2023,11:33 AM

## 2023-01-08 NOTE — Progress Notes (Signed)
Patient ID: Diane Lloyd, female   DOB: 1990-09-28, 32 y.o.   MRN: [id number] CTSP regarding vaginal bleeding 2324 Arrived at bedside at 2232 QBL 981 cc Pt is having contractions q2 min  Likley abruption  FHR 150s, last couple of minutes they have traced maternal but FHR 150s  CBC ordered  T&S drawn as well, in date I think  Breech  Proceed with urgent Caesarean section, staff notified  Lazaro Arms, MD 01/09/2023 12:03 AM

## 2023-01-08 NOTE — Anesthesia Preprocedure Evaluation (Signed)
Anesthesia Evaluation  Patient identified by MRN, date of birth, ID band Patient awake    Reviewed: Allergy & Precautions, NPO status , Patient's Chart, lab work & pertinent test results, reviewed documented beta blocker date and time   Airway        Dental   Pulmonary asthma           Cardiovascular hypertension, Pt. on medications and Pt. on home beta blockers      Neuro/Psych  Headaches PSYCHIATRIC DISORDERS Anxiety Depression       GI/Hepatic Neg liver ROS,GERD  ,,  Endo/Other  diabetes, Gestational  Obesity  Renal/GU negative Renal ROS  negative genitourinary   Musculoskeletal negative musculoskeletal ROS (+)    Abdominal   Peds  Hematology  (+) Blood dyscrasia, anemia   Anesthesia Other Findings   Reproductive/Obstetrics (+) Pregnancy 31 2/7 weeks Placenta previa- bleeding                             Anesthesia Physical Anesthesia Plan  ASA: 3 and emergent  Anesthesia Plan: Spinal   Post-op Pain Management: Minimal or no pain anticipated and Regional block*   Induction: Intravenous  PONV Risk Score and Plan: 4 or greater and Treatment may vary due to age or medical condition and Scopolamine patch - Pre-op  Airway Management Planned: Natural Airway  Additional Equipment: None  Intra-op Plan:   Post-operative Plan:   Informed Consent: I have reviewed the patients History and Physical, chart, labs and discussed the procedure including the risks, benefits and alternatives for the proposed anesthesia with the patient or authorized representative who has indicated his/her understanding and acceptance.     Dental advisory given  Plan Discussed with: Anesthesiologist and CRNA  Anesthesia Plan Comments:        Anesthesia Quick Evaluation

## 2023-01-08 NOTE — Progress Notes (Incomplete)
Pt called out that bleeding had restarted when up to bathroom. Upon assessment

## 2023-01-08 NOTE — Plan of Care (Signed)
  Problem: Education: Goal: Knowledge of disease or condition will improve Outcome: Progressing Goal: Knowledge of the prescribed therapeutic regimen will improve Outcome: Progressing   Problem: Clinical Measurements: Goal: Complications related to the disease process, condition or treatment will be avoided or minimized Outcome: Progressing   Problem: Health Behavior/Discharge Planning: Goal: Ability to manage health-related needs will improve Outcome: Progressing   Problem: Clinical Measurements: Goal: Ability to maintain clinical measurements within normal limits will improve Outcome: Progressing Goal: Will remain free from infection Outcome: Progressing Goal: Diagnostic test results will improve Outcome: Progressing   Problem: Elimination: Goal: Will not experience complications related to bowel motility Outcome: Progressing   Problem: Pain Management: Goal: General experience of comfort will improve Outcome: Progressing   Problem: Safety: Goal: Ability to remain free from injury will improve Outcome: Progressing   Problem: Skin Integrity: Goal: Risk for impaired skin integrity will decrease Outcome: Progressing

## 2023-01-09 ENCOUNTER — Other Ambulatory Visit: Payer: Self-pay

## 2023-01-09 ENCOUNTER — Inpatient Hospital Stay (HOSPITAL_COMMUNITY): Payer: Medicaid Other | Admitting: Anesthesiology

## 2023-01-09 ENCOUNTER — Encounter (HOSPITAL_COMMUNITY)
Admission: AD | Disposition: A | Discharge status: Home / Self Care | Payer: Self-pay | Source: Home / Self Care | Attending: Obstetrics and Gynecology

## 2023-01-09 ENCOUNTER — Encounter (HOSPITAL_COMMUNITY): Payer: Self-pay | Admitting: Obstetrics and Gynecology

## 2023-01-09 DIAGNOSIS — Z3A31 31 weeks gestation of pregnancy: Secondary | ICD-10-CM

## 2023-01-09 DIAGNOSIS — O99344 Other mental disorders complicating childbirth: Secondary | ICD-10-CM

## 2023-01-09 DIAGNOSIS — O321XX0 Maternal care for breech presentation, not applicable or unspecified: Secondary | ICD-10-CM

## 2023-01-09 DIAGNOSIS — Z98891 History of uterine scar from previous surgery: Principal | ICD-10-CM

## 2023-01-09 DIAGNOSIS — O4593 Premature separation of placenta, unspecified, third trimester: Secondary | ICD-10-CM

## 2023-01-09 DIAGNOSIS — O4443 Low lying placenta NOS or without hemorrhage, third trimester: Secondary | ICD-10-CM

## 2023-01-09 DIAGNOSIS — O10013 Pre-existing essential hypertension complicating pregnancy, third trimester: Secondary | ICD-10-CM

## 2023-01-09 LAB — CBC
HCT: 27.6 % — ABNORMAL LOW (ref 36.0–46.0)
HCT: 27.9 % — ABNORMAL LOW (ref 36.0–46.0)
HCT: 29.5 % — ABNORMAL LOW (ref 36.0–46.0)
HCT: 31.1 % — ABNORMAL LOW (ref 36.0–46.0)
Hemoglobin: 10.1 g/dL — ABNORMAL LOW (ref 12.0–15.0)
Hemoglobin: 8.9 g/dL — ABNORMAL LOW (ref 12.0–15.0)
Hemoglobin: 9.1 g/dL — ABNORMAL LOW (ref 12.0–15.0)
Hemoglobin: 9.6 g/dL — ABNORMAL LOW (ref 12.0–15.0)
MCH: 27.2 pg (ref 26.0–34.0)
MCH: 27.2 pg (ref 26.0–34.0)
MCH: 27.6 pg (ref 26.0–34.0)
MCH: 27.6 pg (ref 26.0–34.0)
MCHC: 32.2 g/dL (ref 30.0–36.0)
MCHC: 32.5 g/dL (ref 30.0–36.0)
MCHC: 32.5 g/dL (ref 30.0–36.0)
MCHC: 32.6 g/dL (ref 30.0–36.0)
MCV: 83.5 fL (ref 80.0–100.0)
MCV: 83.8 fL (ref 80.0–100.0)
MCV: 84.8 fL (ref 80.0–100.0)
MCV: 85.4 fL (ref 80.0–100.0)
Platelets: 231 10*3/uL (ref 150–400)
Platelets: 249 10*3/uL (ref 150–400)
Platelets: 251 10*3/uL (ref 150–400)
Platelets: 289 10*3/uL (ref 150–400)
RBC: 3.23 MIL/uL — ABNORMAL LOW (ref 3.87–5.11)
RBC: 3.34 MIL/uL — ABNORMAL LOW (ref 3.87–5.11)
RBC: 3.48 MIL/uL — ABNORMAL LOW (ref 3.87–5.11)
RBC: 3.71 MIL/uL — ABNORMAL LOW (ref 3.87–5.11)
RDW: 15 % (ref 11.5–15.5)
RDW: 15.2 % (ref 11.5–15.5)
RDW: 15.2 % (ref 11.5–15.5)
RDW: 15.2 % (ref 11.5–15.5)
WBC: 12.4 10*3/uL — ABNORMAL HIGH (ref 4.0–10.5)
WBC: 13.1 10*3/uL — ABNORMAL HIGH (ref 4.0–10.5)
WBC: 13.6 10*3/uL — ABNORMAL HIGH (ref 4.0–10.5)
WBC: 9.3 10*3/uL (ref 4.0–10.5)
nRBC: 0 % (ref 0.0–0.2)
nRBC: 0 % (ref 0.0–0.2)
nRBC: 0 % (ref 0.0–0.2)
nRBC: 0 % (ref 0.0–0.2)

## 2023-01-09 LAB — TYPE AND SCREEN

## 2023-01-09 LAB — GLUCOSE, CAPILLARY: Glucose-Capillary: 155 mg/dL — ABNORMAL HIGH (ref 70–99)

## 2023-01-09 SURGERY — Surgical Case
Anesthesia: Spinal

## 2023-01-09 MED ORDER — MEASLES, MUMPS & RUBELLA VAC IJ SOLR: 0.5000 mL | Freq: Once | INTRAMUSCULAR | Status: DC

## 2023-01-09 MED ORDER — SODIUM CHLORIDE 0.9% FLUSH
3.0000 mL | INTRAVENOUS | Status: DC | PRN
  Administered 2023-01-09: 3 mL via INTRAVENOUS

## 2023-01-09 MED ORDER — MENTHOL 3 MG MT LOZG: 1.0000 | LOZENGE | OROMUCOSAL | Status: DC | PRN

## 2023-01-09 MED ORDER — FENTANYL CITRATE (PF) 100 MCG/2ML IJ SOLN
INTRAMUSCULAR | Status: DC | PRN
  Administered 2023-01-09: 15 ug via INTRATHECAL

## 2023-01-09 MED ORDER — OXYTOCIN-SODIUM CHLORIDE 30-0.9 UT/500ML-% IV SOLN
2.5000 [IU]/h | INTRAVENOUS | Status: AC
  Administered 2023-01-09: 2.5 [IU]/h via INTRAVENOUS
  Filled 2023-01-09: qty 500

## 2023-01-09 MED ORDER — PRENATAL MULTIVITAMIN CH
1.0000 | ORAL_TABLET | Freq: Every day | ORAL | Status: DC
  Filled 2023-01-09 (×4): qty 1

## 2023-01-09 MED ORDER — DIPHENHYDRAMINE HCL 25 MG PO CAPS
25.0000 mg | ORAL_CAPSULE | ORAL | Status: DC | PRN
  Filled 2023-01-09 (×2): qty 1

## 2023-01-09 MED ORDER — WITCH HAZEL-GLYCERIN EX PADS: 1.0000 | MEDICATED_PAD | CUTANEOUS | Status: DC | PRN

## 2023-01-09 MED ORDER — ZOLPIDEM TARTRATE 5 MG PO TABS: 5.0000 mg | ORAL_TABLET | Freq: Every evening | ORAL | Status: DC | PRN

## 2023-01-09 MED ORDER — IBUPROFEN 600 MG PO TABS
600.0000 mg | ORAL_TABLET | Freq: Four times a day (QID) | ORAL | Status: DC
  Administered 2023-01-10 – 2023-01-12 (×10): 600 mg via ORAL
  Filled 2023-01-09 (×11): qty 1

## 2023-01-09 MED ORDER — EPHEDRINE SULFATE (PRESSORS) 50 MG/ML IJ SOLN
INTRAMUSCULAR | Status: DC | PRN
  Administered 2023-01-09: 10 mg via INTRAVENOUS

## 2023-01-09 MED ORDER — BUPIVACAINE IN DEXTROSE 0.75-8.25 % IT SOLN
INTRATHECAL | Status: DC | PRN
  Administered 2023-01-09: 2 mL via INTRATHECAL

## 2023-01-09 MED ORDER — ACETAMINOPHEN 10 MG/ML IV SOLN
INTRAVENOUS | Status: DC | PRN
  Administered 2023-01-09: 1000 mg via INTRAVENOUS

## 2023-01-09 MED ORDER — KETOROLAC TROMETHAMINE 30 MG/ML IJ SOLN
30.0000 mg | Freq: Four times a day (QID) | INTRAMUSCULAR | Status: AC
  Administered 2023-01-09 – 2023-01-10 (×4): 30 mg via INTRAVENOUS
  Filled 2023-01-09 (×4): qty 1

## 2023-01-09 MED ORDER — CEFAZOLIN SODIUM-DEXTROSE 2-3 GM-%(50ML) IV SOLR
INTRAVENOUS | Status: DC | PRN
  Administered 2023-01-09: 2 g via INTRAVENOUS

## 2023-01-09 MED ORDER — OXYCODONE HCL 5 MG PO TABS
5.0000 mg | ORAL_TABLET | ORAL | Status: DC | PRN
  Administered 2023-01-10 – 2023-01-11 (×4): 5 mg via ORAL
  Administered 2023-01-12: 10 mg via ORAL
  Filled 2023-01-09 (×2): qty 1
  Filled 2023-01-09: qty 2
  Filled 2023-01-09 (×2): qty 1

## 2023-01-09 MED ORDER — ENOXAPARIN SODIUM 60 MG/0.6ML IJ SOSY
60.0000 mg | PREFILLED_SYRINGE | INTRAMUSCULAR | Status: DC
Start: 2023-01-10 — End: 2023-01-12
  Administered 2023-01-10 – 2023-01-11 (×2): 60 mg via SUBCUTANEOUS
  Filled 2023-01-09 (×2): qty 0.6

## 2023-01-09 MED ORDER — TRANEXAMIC ACID-NACL 1000-0.7 MG/100ML-% IV SOLN
INTRAVENOUS | Status: DC | PRN
Start: 2023-01-09 — End: 2023-01-09
  Administered 2023-01-09: 1000 mg via INTRAVENOUS

## 2023-01-09 MED ORDER — SIMETHICONE 80 MG PO CHEW: 80.0000 mg | CHEWABLE_TABLET | ORAL | Status: DC | PRN

## 2023-01-09 MED ORDER — DIBUCAINE (PERIANAL) 1 % EX OINT: 1.0000 | TOPICAL_OINTMENT | CUTANEOUS | Status: DC | PRN

## 2023-01-09 MED ORDER — COCONUT OIL OIL: 1.0000 | TOPICAL_OIL | Status: DC | PRN

## 2023-01-09 MED ORDER — DIPHENHYDRAMINE HCL 50 MG/ML IJ SOLN: 12.5000 mg | INTRAMUSCULAR | Status: DC | PRN

## 2023-01-09 MED ORDER — DIPHENHYDRAMINE HCL 25 MG PO CAPS
25.0000 mg | ORAL_CAPSULE | Freq: Four times a day (QID) | ORAL | Status: DC | PRN
  Administered 2023-01-09 (×2): 25 mg via ORAL

## 2023-01-09 MED ORDER — METOCLOPRAMIDE HCL 5 MG/ML IJ SOLN
INTRAMUSCULAR | Status: DC | PRN
  Administered 2023-01-09: 10 mg via INTRAVENOUS

## 2023-01-09 MED ORDER — GABAPENTIN 100 MG PO CAPS
100.0000 mg | ORAL_CAPSULE | Freq: Three times a day (TID) | ORAL | Status: DC
Start: 2023-01-09 — End: 2023-01-12
  Administered 2023-01-09 – 2023-01-12 (×10): 100 mg via ORAL
  Filled 2023-01-09 (×10): qty 1

## 2023-01-09 MED ORDER — PHENYLEPHRINE HCL (PRESSORS) 10 MG/ML IV SOLN
INTRAVENOUS | Status: DC | PRN
  Administered 2023-01-09 (×2): 80 ug via INTRAVENOUS

## 2023-01-09 MED ORDER — METHYLERGONOVINE MALEATE 0.2 MG/ML IJ SOLN: 0.2000 mg | INTRAMUSCULAR | Status: DC | PRN

## 2023-01-09 MED ORDER — PHENYLEPHRINE HCL-NACL 20-0.9 MG/250ML-% IV SOLN
INTRAVENOUS | Status: DC | PRN
  Administered 2023-01-09: 60 ug/min via INTRAVENOUS

## 2023-01-09 MED ORDER — ACETAMINOPHEN 500 MG PO TABS
1000.0000 mg | ORAL_TABLET | Freq: Four times a day (QID) | ORAL | Status: DC
  Administered 2023-01-09 – 2023-01-12 (×13): 1000 mg via ORAL
  Filled 2023-01-09 (×15): qty 2

## 2023-01-09 MED ORDER — METHYLERGONOVINE MALEATE 0.2 MG PO TABS: 0.2000 mg | ORAL_TABLET | ORAL | Status: DC | PRN

## 2023-01-09 MED ORDER — ONDANSETRON HCL 4 MG/2ML IJ SOLN: 4.0000 mg | Freq: Three times a day (TID) | INTRAMUSCULAR | Status: DC | PRN

## 2023-01-09 MED ORDER — SENNOSIDES-DOCUSATE SODIUM 8.6-50 MG PO TABS
2.0000 | ORAL_TABLET | Freq: Every day | ORAL | Status: DC
  Administered 2023-01-10 – 2023-01-12 (×2): 2 via ORAL
  Filled 2023-01-09 (×3): qty 2

## 2023-01-09 MED ORDER — TETANUS-DIPHTH-ACELL PERTUSSIS 5-2.5-18.5 LF-MCG/0.5 IM SUSY: 0.5000 mL | PREFILLED_SYRINGE | Freq: Once | INTRAMUSCULAR | Status: DC

## 2023-01-09 MED ORDER — STERILE WATER FOR IRRIGATION IR SOLN
Status: DC | PRN
  Administered 2023-01-09: 1

## 2023-01-09 MED ORDER — OXYTOCIN-SODIUM CHLORIDE 30-0.9 UT/500ML-% IV SOLN
INTRAVENOUS | Status: DC | PRN
  Administered 2023-01-09: 999 mL/h via INTRAVENOUS

## 2023-01-09 MED ORDER — FUROSEMIDE 20 MG PO TABS
20.0000 mg | ORAL_TABLET | Freq: Two times a day (BID) | ORAL | Status: DC
  Administered 2023-01-09 – 2023-01-12 (×7): 20 mg via ORAL
  Filled 2023-01-09 (×7): qty 1

## 2023-01-09 MED ORDER — SIMETHICONE 80 MG PO CHEW
80.0000 mg | CHEWABLE_TABLET | Freq: Three times a day (TID) | ORAL | Status: DC
  Administered 2023-01-09 – 2023-01-12 (×10): 80 mg via ORAL
  Filled 2023-01-09 (×10): qty 1

## 2023-01-09 MED ORDER — FENTANYL CITRATE (PF) 100 MCG/2ML IJ SOLN
25.0000 ug | INTRAMUSCULAR | Status: DC | PRN
  Administered 2023-01-09: 50 ug via INTRAVENOUS

## 2023-01-09 MED ORDER — SERTRALINE HCL 50 MG PO TABS
100.0000 mg | ORAL_TABLET | Freq: Every day | ORAL | Status: DC
  Administered 2023-01-09 – 2023-01-12 (×4): 100 mg via ORAL
  Filled 2023-01-09 (×5): qty 2

## 2023-01-09 MED ORDER — NALOXONE HCL 0.4 MG/ML IJ SOLN: 0.4000 mg | INTRAMUSCULAR | Status: DC | PRN

## 2023-01-09 MED ORDER — METHYLERGONOVINE MALEATE 0.2 MG/ML IJ SOLN
INTRAMUSCULAR | Status: DC | PRN
Start: 2023-01-09 — End: 2023-01-09
  Administered 2023-01-09: .2 mg via INTRAMUSCULAR

## 2023-01-09 MED ORDER — NALOXONE HCL 4 MG/10ML IJ SOLN: 1.0000 ug/kg/h | INTRAVENOUS | Status: DC | PRN

## 2023-01-09 MED ORDER — MORPHINE SULFATE (PF) 0.5 MG/ML IJ SOLN
INTRAMUSCULAR | Status: DC | PRN
  Administered 2023-01-09: .15 mg via INTRATHECAL

## 2023-01-09 MED ORDER — FENTANYL CITRATE (PF) 100 MCG/2ML IJ SOLN
INTRAMUSCULAR | Status: AC
  Filled 2023-01-09: qty 2

## 2023-01-09 MED ORDER — ONDANSETRON HCL 4 MG/2ML IJ SOLN
INTRAMUSCULAR | Status: DC | PRN
  Administered 2023-01-09: 4 mg via INTRAVENOUS

## 2023-01-09 MED ORDER — MEPERIDINE HCL 25 MG/ML IJ SOLN: 6.2500 mg | INTRAMUSCULAR | Status: DC | PRN

## 2023-01-09 MED ORDER — DEXAMETHASONE SODIUM PHOSPHATE 4 MG/ML IJ SOLN
INTRAMUSCULAR | Status: DC | PRN
  Administered 2023-01-09: 10 mg via INTRAVENOUS

## 2023-01-09 MED ORDER — LACTATED RINGERS IV SOLN: INTRAVENOUS | Status: DC | PRN

## 2023-01-09 SURGICAL SUPPLY — 34 items
BENZOIN TINCTURE PRP APPL 2/3 (GAUZE/BANDAGES/DRESSINGS) IMPLANT
CHLORAPREP W/TINT 26 (MISCELLANEOUS) ×2 IMPLANT
CLAMP UMBILICAL CORD (MISCELLANEOUS) ×1 IMPLANT
CLOTH BEACON ORANGE TIMEOUT ST (SAFETY) ×1 IMPLANT
DERMABOND ADVANCED .7 DNX12 (GAUZE/BANDAGES/DRESSINGS) ×2 IMPLANT
DRSG OPSITE POSTOP 4X10 (GAUZE/BANDAGES/DRESSINGS) ×1 IMPLANT
ELECT REM PT RETURN 9FT ADLT (ELECTROSURGICAL) ×1
ELECTRODE REM PT RTRN 9FT ADLT (ELECTROSURGICAL) ×1 IMPLANT
EXTRACTOR VACUUM BELL STYLE (SUCTIONS) IMPLANT
GLOVE BIOGEL PI IND STRL 7.0 (GLOVE) ×1 IMPLANT
GLOVE BIOGEL PI IND STRL 8 (GLOVE) ×1 IMPLANT
GLOVE ECLIPSE 8.0 STRL XLNG CF (GLOVE) ×1 IMPLANT
GOWN STRL REUS W/TWL LRG LVL3 (GOWN DISPOSABLE) ×2 IMPLANT
KIT ABG SYR 3ML LUER SLIP (SYRINGE) ×1 IMPLANT
MAT PREVALON FULL STRYKER (MISCELLANEOUS) IMPLANT
NDL HYPO 18GX1.5 BLUNT FILL (NEEDLE) ×1 IMPLANT
NDL HYPO 25X5/8 SAFETYGLIDE (NEEDLE) ×1 IMPLANT
NEEDLE HYPO 18GX1.5 BLUNT FILL (NEEDLE) ×1 IMPLANT
NEEDLE HYPO 22GX1.5 SAFETY (NEEDLE) ×1 IMPLANT
NEEDLE HYPO 25X5/8 SAFETYGLIDE (NEEDLE) ×1 IMPLANT
NS IRRIG 1000ML POUR BTL (IV SOLUTION) ×1 IMPLANT
PACK C SECTION WH (CUSTOM PROCEDURE TRAY) ×1 IMPLANT
PAD OB MATERNITY 4.3X12.25 (PERSONAL CARE ITEMS) ×1 IMPLANT
RTRCTR C-SECT PINK 25CM LRG (MISCELLANEOUS) IMPLANT
STRIP CLOSURE SKIN 1/2X4 (GAUZE/BANDAGES/DRESSINGS) IMPLANT
SUT CHROMIC 0 CT 1 (SUTURE) ×1 IMPLANT
SUT MNCRL 0 VIOLET CTX 36 (SUTURE) ×2 IMPLANT
SUT PLAIN 2 0 XLH (SUTURE) IMPLANT
SUT PLAIN ABS 2-0 CT1 27XMFL (SUTURE) IMPLANT
SUT VIC AB 0 CTX36XBRD ANBCTRL (SUTURE) ×1 IMPLANT
SUT VIC AB 4-0 KS 27 (SUTURE) IMPLANT
TOWEL OR 17X24 6PK STRL BLUE (TOWEL DISPOSABLE) ×1 IMPLANT
TRAY FOLEY W/BAG SLVR 14FR LF (SET/KITS/TRAYS/PACK) IMPLANT
WATER STERILE IRR 1000ML POUR (IV SOLUTION) ×1 IMPLANT

## 2023-01-09 NOTE — Anesthesia Procedure Notes (Deleted)
Epidural Patient location during procedure: OB  Preanesthetic Checklist Completed: patient identified, IV checked, site marked, risks and benefits discussed, surgical consent, monitors and equipment checked, pre-op evaluation and timeout performed  Epidural Patient position: sitting Prep: DuraPrep and site prepped and draped Patient monitoring: continuous pulse ox and blood pressure Approach: midline Injection technique: LOR air  Needle:  Needle type: Tuohy  Needle gauge: 17 G Needle length: 9 cm and 9 Needle insertion depth: 5 cm cm Catheter type: closed end flexible Catheter size: 19 Gauge Catheter at skin depth: 10 cm Test dose: negative  Assessment Events: blood not aspirated, no cerebrospinal fluid, injection not painful, no injection resistance, no paresthesia and negative IV test

## 2023-01-09 NOTE — Transfer of Care (Signed)
Immediate Anesthesia Transfer of Care Note  Patient: Diane Lloyd  Procedure(s) Performed: CESAREAN SECTION  Patient Location: PACU  Anesthesia Type:Spinal  Level of Consciousness: awake, alert , and oriented  Airway & Oxygen Therapy: Patient Spontanous Breathing  Post-op Assessment: Report given to RN and Post -op Vital signs reviewed and stable  Post vital signs: Reviewed and stable  Last Vitals:  Vitals Value Taken Time  BP 108/50 01/09/23 0115  Temp    Pulse 75 01/09/23 0118  Resp 14 01/09/23 0118  SpO2 99 % 01/09/23 0118  Vitals shown include unfiled device data.  Last Pain:  Vitals:   01/08/23 2333  TempSrc:   PainSc: 3          Complications: No notable events documented.

## 2023-01-09 NOTE — Lactation Note (Signed)
This note was copied from a baby's chart.  NICU Lactation Consultation Note  Patient Name: Diane Lloyd [id number] Date: 01/09/2023 Age:30 hours  Reason for consult: Initial assessment; Preterm <34wks; Primapara; 1st time breastfeeding; NICU baby; Other (Comment); Maternal endocrine disorder Type of Endocrine Disorder?: Diabetes; PCOS (A1GDM)  SUBJECTIVE Visited with family of 46 hours old pre-term NICU female; baby "[name]" got admitted to the NICU due to prematurity. Ms. Beland is a P1 and reports that her plan is to primarily breastfeed once she's able to, but also amenable to pumping and bottle feeding if necessary. Explained pump instructions and settings; [facility] RN Marchelle Folks will set her up with a DEBP once she comes back to her room downstairs, this consult took place in the NICU. Reviewed pumping schedule, pumping log, lactogenesis II and anticipatory guidelines.   OBJECTIVE Infant data: Mother's Current Feeding Choice: -- (NPO)  O2 Device: CPAP FiO2 (%): 21 %  Maternal data: G1P0101 C-Section, Low Transverse Has patient been taught Hand Expression?: Yes Hand Expression Comments: no colostrum noted yet Significant Breast History:: (+) breast changes during the pregnancy Current breast feeding challenges:: NICU admission Does the patient have breastfeeding experience prior to this delivery?: No Pumping frequency: Set up DEBP at 14 hours post-partum Flange Size: 21 Risk factor for low/delayed milk supply:: C/S, PPH of 1624 cc, primipara, prematurity, infant separation, delayed onset of pumping  WIC Program: Yes WIC Referral Sent?: No What county?: Other ([place] in [place], but she'll be staying here with baby) Pump:  ([employer] pump referral sent on 01/09/2023)  ASSESSMENT Infant: Feeding Status: NPO  Maternal: Breasts are soft and tissue is compressible  INTERVENTIONS/PLAN Interventions: Interventions: Breast feeding basics reviewed; Breast massage; Hand express;  Coconut oil; DEBP Tools: Pump; Flanges; Coconut oil Pump Education: Setup, frequency, and cleaning; Milk Storage  Plan: Encouraged pumping every 3 hours, ideally 8 pumping sessions/24 hours Breast massage, hand expression and coconut oil were also encouraged prior pumping  No other support person at this time. All questions and concerns answered, family to contact [facility] services PRN.  Consult Status: NICU follow-up NICU Follow-up type: New admission follow up; Verify DEBP issuance   Batsheva Stevick Venetia Constable 01/09/2023, 3:37 PM

## 2023-01-09 NOTE — Progress Notes (Signed)
Patient ID: Diane Lloyd, female   DOB: May 02, 1990, 32 y.o.   MRN: [id number] * Day of Surgery * Procedure(s) (LRB): CESAREAN SECTION (N/A)  Diane Lloyd is a 32 y.o. female patient.   S/P urgent C section for placental abruption  Past Medical History:  Diagnosis Date   Anemia    iron def   Anxiety    Asthma    Diabetes mellitus without complication (HCC)    Family history of cardiac disorder 02/17/2011   Gestational diabetes    History of COVID-19 11/01/2019   Hypertension    Obesity, unspecified    PCOS (polycystic ovarian syndrome)    Positive ANA (antinuclear antibody) 05/05/2017   done by [facility] in [facility]; ANA 1:160, speckled pattern    Past Surgical History Pertinent Negatives:  Procedure Date Noted   NO PAST SURGERIES 01/13/2013    Scheduled Meds:  acetaminophen  1,000 mg Oral Q6H   [START ON 01/10/2023] enoxaparin (LOVENOX) injection  60 mg Subcutaneous Q24H   gabapentin  100 mg Oral TID   ketorolac  30 mg Intravenous Q6H   Followed by   Melene Muller ON 01/10/2023] ibuprofen  600 mg Oral Q6H   [START ON 01/10/2023] measles, mumps & rubella vaccine  0.5 mL Subcutaneous Once   NIFEdipine  30 mg Oral BID   prenatal multivitamin  1 tablet Oral Q1200   [START ON 01/10/2023] senna-docusate  2 tablet Oral Daily   sertraline  100 mg Oral Daily   simethicone  80 mg Oral TID PC   [START ON 01/10/2023] Tdap  0.5 mL Intramuscular Once    Continuous Infusions:  naloxone HCl (NARCAN) 2 mg in dextrose 5 % 250 mL infusion     oxytocin 2.5 Units/hr (01/09/23 0402)    PRN Meds:coconut oil, witch hazel-glycerin **AND** dibucaine, diphenhydrAMINE **OR** diphenhydrAMINE, diphenhydrAMINE, menthol-cetylpyridinium, methylergonovine **OR** methylergonovine, naloxone **AND** sodium chloride flush, naloxone HCl (NARCAN) 2 mg in dextrose 5 % 250 mL infusion, ondansetron (ZOFRAN) IV, oxyCODONE, simethicone, zolpidem  Allergies  Allergen Reactions   Diflucan  [Fluconazole] Itching and Rash    Principal Problem:   Status post primary low transverse cesarean section Active Problems:   Vaginal bleeding in pregnancy, third trimester   HSV (herpes simplex virus) anogenital infection   Subjective   No complaints this am  Objective   Vitals:   01/09/23 0503 01/09/23 0504 01/09/23 0609 01/09/23 0612  BP: (!) 125/58   (!) 141/60  Pulse: 75   68  Resp: 18   18  Temp:      TempSrc:      SpO2:  97% 98%   Weight:      Height:       Vitals:   01/08/23 0858 01/08/23 0903 01/08/23 1205 01/08/23 1706  BP: (!) 147/64 127/61 (!) 116/58 133/63   01/09/23 0115 01/09/23 0130 01/09/23 0145 01/09/23 0200  BP: (!) 108/50 (!) 116/58 (!) 122/55 123/65   01/09/23 0215 01/09/23 0353 01/09/23 0503 01/09/23 0612  BP: 133/70 (!) 130/54 (!) 125/58 (!) 141/60     Subjective Objective: Vital signs (most recent): Blood pressure (!) 141/60, pulse 68, temperature 98.3 F (36.8 C), temperature source Oral, resp. rate 18, height 5\' 10"  (1.778 m), weight 116.6 kg, last menstrual period 06/03/2022, SpO2 98%, unknown if currently breastfeeding.   Gen  WDWN NAD Abdomen  soft benign Incision  clean dry intact     Latest Ref Rng & Units 01/09/2023    5:50 AM 01/09/2023  1:55 AM 01/08/2023   11:43 PM  CBC  WBC 4.0 - 10.5 K/uL 13.6  13.1  9.3   Hemoglobin 12.0 - 15.0 g/dL 9.6  8.9  64.4   Hematocrit 36.0 - 46.0 % 29.5  27.6  31.1   Platelets 150 - 400 K/uL 251  231  289        Latest Ref Rng & Units 12/12/2022   10:42 PM 08/08/2022   12:07 AM 11/01/2019   10:15 AM  CMP  Glucose 70 - 99 mg/dL 85  034  85   BUN 6 - 20 mg/dL 5  9  13    Creatinine 0.44 - 1.00 mg/dL 7.42  5.95  6.38   Sodium 135 - 145 mmol/L 135  136  139   Potassium 3.5 - 5.1 mmol/L 3.4  3.3  3.9   Chloride 98 - 111 mmol/L 109  106  101   CO2 22 - 32 mmol/L 18  20  22    Calcium 8.9 - 10.3 mg/dL 8.9  9.0  9.8   Total Protein 6.0 - 8.5 g/dL   7.8   Total Bilirubin 0.0 - 1.2 mg/dL   0.7    Alkaline Phos 44 - 121 IU/L   55   AST 0 - 40 IU/L   20   ALT 0 - 32 IU/L   22      Assessment & Plan DOS urgent C section, abruption, back up transverse lie Blood counts doing well Routine care post op Continue procardia xl 30 BID, add lasix 20 BID  Lazaro Arms, MD 01/09/2023, 7:54 AM

## 2023-01-09 NOTE — Plan of Care (Signed)
°  Problem: Education: Goal: Knowledge of disease or condition will improve Outcome: Progressing Goal: Knowledge of the prescribed therapeutic regimen will improve Outcome: Progressing   Problem: Clinical Measurements: Goal: Complications related to the disease process, condition or treatment will be avoided or minimized Outcome: Progressing   Problem: Health Behavior/Discharge Planning: Goal: Ability to manage health-related needs will improve Outcome: Progressing   Problem: Clinical Measurements: Goal: Ability to maintain clinical measurements within normal limits will improve Outcome: Progressing Goal: Will remain free from infection Outcome: Progressing Goal: Diagnostic test results will improve Outcome: Progressing   Problem: Elimination: Goal: Will not experience complications related to bowel motility Outcome: Progressing   Problem: Pain Management: Goal: General experience of comfort will improve Outcome: Progressing   Problem: Safety: Goal: Ability to remain free from injury will improve Outcome: Progressing   Problem: Skin Integrity: Goal: Risk for impaired skin integrity will decrease Outcome: Progressing   Problem: Education: Goal: Knowledge of the prescribed therapeutic regimen will improve Outcome: Progressing Goal: Understanding of sexual limitations or changes related to disease process or condition will improve Outcome: Progressing Goal: Individualized Educational Video(s) Outcome: Progressing   Problem: Self-Concept: Goal: Communication of feelings regarding changes in body function or appearance will improve Outcome: Progressing   Problem: Skin Integrity: Goal: Demonstration of wound healing without infection will improve Outcome: Progressing   Problem: Education: Goal: Knowledge of condition will improve Outcome: Progressing Goal: Individualized Educational Video(s) Outcome: Progressing Goal: Individualized Newborn Educational  Video(s) Outcome: Progressing   Problem: Activity: Goal: Will verbalize the importance of balancing activity with adequate rest periods Outcome: Progressing Goal: Ability to tolerate increased activity will improve Outcome: Progressing   Problem: Coping: Goal: Ability to identify and utilize available resources and services will improve Outcome: Progressing   Problem: Life Cycle: Goal: Chance of risk for complications during the postpartum period will decrease Outcome: Progressing   Problem: Role Relationship: Goal: Ability to demonstrate positive interaction with newborn will improve Outcome: Progressing   Problem: Skin Integrity: Goal: Demonstration of wound healing without infection will improve Outcome: Progressing

## 2023-01-09 NOTE — Anesthesia Postprocedure Evaluation (Signed)
Anesthesia Post Note  Patient: Diane Lloyd  Procedure(s) Performed: CESAREAN SECTION     Patient location during evaluation: PACU Anesthesia Type: Spinal Level of consciousness: oriented and awake and alert Pain management: pain level controlled Vital Signs Assessment: post-procedure vital signs reviewed and stable Respiratory status: spontaneous breathing, respiratory function stable and nonlabored ventilation Cardiovascular status: blood pressure returned to baseline and stable Postop Assessment: no headache, no backache, no apparent nausea or vomiting, spinal receding and patient able to bend at knees Anesthetic complications: no   No notable events documented.                 Rober Skeels A.

## 2023-01-09 NOTE — Anesthesia Procedure Notes (Signed)
Spinal  Patient location during procedure: OR Start time: 01/09/2023 12:06 AM End time: 01/09/2023 12:08 AM Reason for block: surgical anesthesia Staffing Performed: anesthesiologist  Anesthesiologist: Mal Amabile, MD Performed by: Mal Amabile, MD Authorized by: Mal Amabile, MD   Preanesthetic Checklist Completed: patient identified, IV checked, site marked, risks and benefits discussed, surgical consent, monitors and equipment checked, pre-op evaluation and timeout performed Spinal Block Patient position: sitting Prep: DuraPrep and site prepped and draped Patient monitoring: heart rate, cardiac monitor, continuous pulse ox and blood pressure Approach: midline Location: L3-4 Injection technique: single-shot Needle Needle type: Pencan  Needle gauge: 24 G Needle length: 9 cm Needle insertion depth: 8 cm Assessment Sensory level: T3 Events: CSF return Additional Notes Patient tolerated procedure well. Adequate sensory level.

## 2023-01-09 NOTE — Op Note (Cosign Needed Addendum)
Cesarean Section Operative Note   Patient: Diane Lloyd  Date of Procedure: 01/01/2023 - 01/09/2023  Procedure: Primary Low Transverse Cesarean   Indications: abruptio placenta  Pre-operative Diagnosis: low lying placenta; abruption; heavy third trimester bleeding.   Post-operative Diagnosis: Same  TOLAC Candidate: Yes   Surgeon: Surgeons and Role:    * Lazaro Arms, MD - Primary    * Joanne Gavel, MD - Assisting  Assistants: An experienced assistant was required given the standard of surgical care given the complexity of the case.  This assistant was needed for exposure, dissection, suctioning, retraction, instrument exchange, assisting with delivery with administration of fundal pressure, and for overall help during the procedure.   Anesthesia: spinal  Anesthesiologist: Mal Amabile, MD   Antibiotics: Cefazolin   Estimated Blood Loss: 643 ml intra-op with ~1100 vaginally prior to delivery  Total IV Fluids: 1100 ml  Urine Output:  150 cc OF clear urine  Specimens: placenta to pathology   Complications:  none    Indications: AAKRITI Lloyd is a 32 y.o. G1P0101 with an IUP [redacted]w[redacted]d presenting for unscheduled, urgent cesarean secondary to the indications listed above. Clinical course notable for admission for vaginal bleeding in setting of low lying placenta 01/02/23. Developed heavy vaginal bleeding and contractions late 01/08/23. Abruption diagnosed and decision made to proceed with urgent cesarean delivery.  The risks of cesarean section discussed with the patient included but were not limited to: bleeding which may require transfusion or reoperation; infection which may require antibiotics; injury to bowel, bladder, ureters or other surrounding organs; injury to the fetus; need for additional procedures including hysterectomy in the event of a life-threatening hemorrhage; placental abnormalities with subsequent pregnancies, incisional problems, thromboembolic  phenomenon and other postoperative/anesthesia complications. The patient concurred with the proposed plan, giving informed written consent for the procedure. Patient NPO status waived given urgency of case. Anesthesia and OR aware. Preoperative prophylactic antibiotics and SCDs ordered on call to the OR.   Findings: Viable infant in back-up transverse presentation, no nuchal cord present. Apgars 3, 8, . Weight  . Clear/bloody amniotic fluid.  Abrupted  placenta, three vessel cord. Normal uterus, Normal bilateral fallopian tubes, Normal bilateral ovaries. No adhesive disease was encountered.  Procedure Details: A Time Out was held and the above information confirmed. The patient received intravenous antibiotics and had sequential compression devices applied to her lower extremities preoperatively. The patient was taken back to the operative suite where spinal anesthesia was administered. After induction of anesthesia, the patient was draped and prepped in the usual sterile manner and placed in a dorsal supine position with a leftward tilt. A low transverse skin incision was made with scalpel and carried down through the subcutaneous tissue to the fascia. Fascial incision was made and extended transversely. The fascia was separated from the underlying rectus tissue superiorly and inferiorly. The rectus muscles were separated in the midline bluntly and the peritoneum was entered bluntly. An Alexis retractor was placed to aid in visualization of the uterus. A bladder flap was not developed. A high transverse uterine incision was made as lower uterine segment was not developed. The infant was successfully delivered from transverse presentation, the umbilical cord was clamped immediately. Cord ph was sent, and cord blood was obtained for evaluation. The placenta was removed Intact and appeared  abrupted . The uterine incision was closed with a single layer running unlocked suture of 0-Monocryl. Due to ongoing bleeding  from the hysterotomy figure of eight sutures of 0 Monocryl  were placed after which there was excellent hemostasis. The abdomen and the pelvis were cleared of all clot and debris and the Jon Gills was removed. Hemostasis was confirmed on all surfaces.  The peritoneum was reapproximated using 2-0 vicryl . The fascia was then closed using 0 Vicryl in a running fashion. The subcutaneous layer was reapproximated with 2-0 plain gut suture. The skin was closed with a 4-0 vicryl subcuticular stitch. The patient tolerated the procedure well. Sponge, lap, instrument and needle counts were correct x 2. She was taken to the recovery room in stable condition.  Disposition: PACU - hemodynamically stable.    Signed: Joanne Gavel, MD OB Fellow, [facility], [facility]

## 2023-01-09 NOTE — Discharge Summary (Addendum)
 Postpartum Discharge Summary      Patient Name: Diane Lloyd DOB: 1990-02-25 MRN: [id number]  Date of admission: 01/01/2023 Delivery date:01/09/2023 Delivering provider: JAYNE VONN DEL Date of discharge: 01/12/2023  Admitting diagnosis: Vaginal bleeding in pregnancy, third trimester [O46.93] Intrauterine pregnancy: [redacted]w[redacted]d     Secondary diagnosis:  Principal Problem:   Status post primary low transverse cesarean section Active Problems:   Essential hypertension   Depression, major, single episode, mild (HCC)   Vaginal bleeding in pregnancy, third trimester   HSV (herpes simplex virus) anogenital infection   Acute blood loss anemia   Anemia affecting pregnancy, antepartum   Chronic hypertension affecting pregnancy  Additional problems: None    Discharge diagnosis: Preterm Pregnancy Delivered and placental abruption                                               Post partum procedures: None Augmentation: N/A Complications: Placental Abruption  Hospital course: Patient initially presented to [facility] 12/22 for vaginal bleeding in setting of known placenta previa. Was observed on antepartum with minimal bleeding and no contractions until 12/28. Developed significant vaginal bleeding ~1000 ml and contractions. Abruption diagnosed and went for urgent cesarean delivery. Postpartum course notable for continuing Labetalol , procardia , lasix . IV iron  given for ABLA. This is greater than expected and due to abruptio and low lying placenta. Stable condition with normal BP. Stable for discharge on POD#3.  Magnesium Sulfate received: No BMZ received: Yes Rhophylac:No MMR:No T-DaP:Given prenatally Flu: N/A RSV Vaccine received: No Transfusion:No  Immunizations received: Immunization History  Administered Date(s) Administered   Hepatitis A 07/24/2010   Influenza Split 01/15/2011   Influenza Whole 10/16/2007   Meningococcal Conjugate 07/24/2010   PFIZER Comirnaty(Gray Top)Covid-19  Tri-Sucrose Vaccine 03/07/2020   PPD Test 05/10/2012, 05/17/2012   Tdap 07/24/2010   Varicella 07/24/2010    Physical exam  Vitals:   01/11/23 1844 01/11/23 2150 01/11/23 2151 01/11/23 2323  BP: (!) 141/76  (!) 151/70 138/61  Pulse: 97  (!) 108 92  Resp: 18 20  18   Temp: 98.7 F (37.1 C) 98.3 F (36.8 C)  98.4 F (36.9 C)  TempSrc: Oral Oral  Oral  SpO2: 99%   100%  Weight:      Height:       General: alert, cooperative, and no distress Lochia: appropriate Uterine Fundus: firm Incision: Healing well with no significant drainage DVT Evaluation: No evidence of DVT seen on physical exam. Labs: Lab Results  Component Value Date   WBC 7.7 01/11/2023   HGB 7.6 (L) 01/11/2023   HCT 23.6 (L) 01/11/2023   MCV 84.9 01/11/2023   PLT 238 01/11/2023      Latest Ref Rng & Units 12/12/2022   10:42 PM  CMP  Glucose 70 - 99 mg/dL 85   BUN 6 - 20 mg/dL 5   Creatinine 9.55 - 8.99 mg/dL 9.38   Sodium 864 - 854 mmol/L 135   Potassium 3.5 - 5.1 mmol/L 3.4   Chloride 98 - 111 mmol/L 109   CO2 22 - 32 mmol/L 18   Calcium  8.9 - 10.3 mg/dL 8.9    Edinburgh Score:    01/10/2023   10:00 AM  Edinburgh Postnatal Depression Scale Screening Tool  I have been able to laugh and see the funny side of things. 0  I have looked forward with  enjoyment to things. 0  I have blamed myself unnecessarily when things went wrong. 2  I have been anxious or worried for no good reason. 0  I have felt scared or panicky for no good reason. 0  Things have been getting on top of me. 1  I have been so unhappy that I have had difficulty sleeping. 0  I have felt sad or miserable. 1  I have been so unhappy that I have been crying. 0  The thought of harming myself has occurred to me. 0  Edinburgh Postnatal Depression Scale Total 4   No data recorded  After visit meds:  Allergies as of 01/12/2023       Reactions   Diflucan  [fluconazole ] Itching, Rash        Medication List     TAKE these medications     aspirin  EC 81 MG tablet Take 81 mg by mouth daily. Swallow whole.   famotidine  20 MG tablet Commonly known as: PEPCID  Take 20 mg by mouth 2 (two) times daily.   furosemide  20 MG tablet Commonly known as: LASIX  Take 1 tablet (20 mg total) by mouth 2 (two) times daily.   ibuprofen  600 MG tablet Commonly known as: ADVIL  Take 1 tablet (600 mg total) by mouth every 6 (six) hours.   labetalol  200 MG tablet Commonly known as: NORMODYNE  Take 400 mg by mouth 2 (two) times daily.   NIFEdipine  30 MG 24 hr tablet Commonly known as: PROCARDIA -XL/NIFEDICAL-XL Take 30 mg by mouth 2 (two) times daily.   oxyCODONE  5 MG immediate release tablet Commonly known as: Oxy IR/ROXICODONE  Take 1-2 tablets (5-10 mg total) by mouth every 4 (four) hours as needed for moderate pain (pain score 4-6).   pantoprazole  40 MG tablet Commonly known as: PROTONIX  Take 1 tablet (40 mg total) by mouth daily. To reduce stomach acid What changed: Another medication with the same name was removed. Continue taking this medication, and follow the directions you see here.   sertraline  100 MG tablet Commonly known as: ZOLOFT  Take 100 mg by mouth daily.   valACYclovir  1000 MG tablet Commonly known as: VALTREX  Take 1,000 mg by mouth daily.         Discharge home in stable condition Infant Feeding: Bottle and Breast Infant Disposition:NICU Discharge instruction: per After Visit Summary and Postpartum booklet. Activity: Advance as tolerated. Pelvic rest for 6 weeks.  Diet: low salt diet Future Appointments: Future Appointments  Date Time Provider Department Center  01/20/2023  1:50 PM Jayne Vonn DEL, MD [facility]   Follow up Visit:  Follow-up Information     Jayne Vonn DEL, MD Follow up on 01/20/2023.   Specialties: Obstetrics and Gynecology, Radiology Why: post op visit Contact information: [address] [place] [address] [phone]                 Message sent to [facility]  12/29  Please schedule this patient for a In person postpartum visit in 4 weeks with the following provider: Any provider. Additional Postpartum F/U:Incision check 1 week  High risk pregnancy complicated by:  placenta previa Delivery mode:  C-Section, Low Transverse Anticipated Birth Control:  Unsure   01/12/2023 Glenys GORMAN Birk, MD

## 2023-01-10 ENCOUNTER — Encounter (HOSPITAL_COMMUNITY): Payer: Self-pay | Admitting: Obstetrics & Gynecology

## 2023-01-10 DIAGNOSIS — D62 Acute posthemorrhagic anemia: Secondary | ICD-10-CM | POA: Diagnosis not present

## 2023-01-10 LAB — CBC
HCT: 24.1 % — ABNORMAL LOW (ref 36.0–46.0)
Hemoglobin: 8 g/dL — ABNORMAL LOW (ref 12.0–15.0)
MCH: 28.3 pg (ref 26.0–34.0)
MCHC: 33.2 g/dL (ref 30.0–36.0)
MCV: 85.2 fL (ref 80.0–100.0)
Platelets: 225 10*3/uL (ref 150–400)
RBC: 2.83 MIL/uL — ABNORMAL LOW (ref 3.87–5.11)
RDW: 15.1 % (ref 11.5–15.5)
WBC: 8.1 10*3/uL (ref 4.0–10.5)
nRBC: 0 % (ref 0.0–0.2)

## 2023-01-10 LAB — RPR: RPR Ser Ql: NONREACTIVE

## 2023-01-10 LAB — HIV ANTIBODY (ROUTINE TESTING W REFLEX): HIV Screen 4th Generation wRfx: NONREACTIVE

## 2023-01-10 MED ORDER — LABETALOL HCL 200 MG PO TABS
200.0000 mg | ORAL_TABLET | Freq: Two times a day (BID) | ORAL | Status: DC
  Administered 2023-01-10 – 2023-01-12 (×5): 200 mg via ORAL
  Filled 2023-01-10 (×5): qty 1

## 2023-01-10 MED ORDER — SODIUM CHLORIDE 0.9 % IV SOLN
500.0000 mg | Freq: Once | INTRAVENOUS | Status: AC
  Administered 2023-01-10: 500 mg via INTRAVENOUS
  Filled 2023-01-10: qty 25

## 2023-01-10 NOTE — Progress Notes (Signed)
Subjective: Postpartum Day 1: Cesarean Delivery Patient reports incisional pain, tolerating PO, and no problems voiding.    Objective: Vital signs in last 24 hours: Temp:  [98 F (36.7 C)-98.9 F (37.2 C)] 98 F (36.7 C) (12/30 0616) Pulse Rate:  [65-100] 89 (12/30 0616) Resp:  [17-18] 18 (12/30 0616) BP: (116-140)/(49-81) 124/60 (12/30 0616) SpO2:  [98 %-100 %] 100 % (12/29 1834)  Physical Exam:  General: alert, cooperative, and no distress Lochia: appropriate Uterine Fundus: firm Incision: healing well, no significant drainage, no dehiscence, no significant erythema DVT Evaluation: No evidence of DVT seen on physical exam.  Recent Labs    01/09/23 1637 01/10/23 0424  HGB 9.1* 8.0*  HCT 27.9* 24.1*    Assessment/Plan: Status post Cesarean section. Doing well postoperatively.  Anticipated acute blood loss anemia--> IV venofer is ordered Continue current care.  Lazaro Arms, MD 01/10/2023, 7:32 AM

## 2023-01-10 NOTE — Plan of Care (Signed)
  Problem: Education: Goal: Knowledge of disease or condition will improve Outcome: Progressing Goal: Knowledge of the prescribed therapeutic regimen will improve Outcome: Progressing   Problem: Clinical Measurements: Goal: Complications related to the disease process, condition or treatment will be avoided or minimized Outcome: Progressing   Problem: Health Behavior/Discharge Planning: Goal: Ability to manage health-related needs will improve Outcome: Progressing   Problem: Clinical Measurements: Goal: Ability to maintain clinical measurements within normal limits will improve Outcome: Progressing Goal: Will remain free from infection Outcome: Progressing Goal: Diagnostic test results will improve Outcome: Progressing

## 2023-01-10 NOTE — Lactation Note (Signed)
This note was copied from a baby's chart.  NICU Lactation Consultation Note  Patient Name: Boy Eliene Franciosa [id number] Date: 01/10/2023 Age:30 hours  Reason for consult: Follow-up assessment; NICU baby; Primapara; 1st time breastfeeding; Other (Comment); Maternal endocrine disorder (CHTN) Type of Endocrine Disorder?: Diabetes; PCOS (GDM)  SUBJECTIVE  LC in to visit with P1 Mom of baby "[name]" born at [redacted]w[redacted]d.  Baby is on CPAP 21% and Mom is going to do STS with baby for the first time this afternoon.    Mom encouraged to always pump after doing STS due to hormonal stimulation that occurs during STS.  Encouraged Mom to do some hand expression and if able to express a drop, to place on baby's lips like lip gloss.  Talked about the benefit of STS with her baby.  Hospital gown provided as Mom was wearing a tee shirt.  Bare chest to bare chest.  Mom a little discouraged about her pumping.  LC encouraged her and reassured her that this was normal.  Encouraged Mom to continue her consistent pumping using the initiation setting as she was using the maintain mode.  LC took the pump parts, and washed the flanges, explaining how this is needed after each pumping if milk isn't expressed.    LC set up a washing and a drying bin with cloth and paper towels in drying bin.  LC labeled them and encouraged Mom to take the pump parts, (raising the top to demonstrate lifting domes out) and bins to baby's room once she is discharged.  OBJECTIVE Infant data: Mother's Current Feeding Choice: Breast Milk and Donor Milk  O2 Device: CPAP FiO2 (%): 21 %  Infant feeding assessment No data recorded  Maternal data: G1P0101 C-Section, Low Transverse Has patient been taught Hand Expression?: Yes Hand Expression Comments: no colostrum noted yet Significant Breast History:: (+) breast changes during the pregnancy Current breast feeding challenges:: NICU admission Does the patient have breastfeeding experience prior  to this delivery?: No Pumping frequency: 8 times per 24 hrs Pumped volume: 0 mL Flange Size: 21 Risk factor for low/delayed milk supply:: C/S, PPH of 1624 cc, primipara, prematurity, infant separation, delayed onset of pumping  WIC Program: Yes WIC Referral Sent?: No What county?: Other ([place] in [place], but she'll be staying here with baby) Pump: Stork Pump [employer])  ASSESSMENT Infant:  To start scheduled feedings with DBM  Feeding Status: NPO  Maternal: Milk volume: Normal Breasts are soft and tissue is compressible INTERVENTIONS/PLAN Interventions: Interventions: Skin to skin; Breast massage; Hand express; DEBP; Education Tools: Pump; Flanges Pump Education: Setup, frequency, and cleaning; Milk Storage  Plan: 1- Do STS with baby as much as possible 2- breast massage and hand expression often 3- Pump both breasts 8 times per 24 hrs  Consult Status: NICU follow-up NICU Follow-up type: Verify absence of engorgement; Verify onset of copious milk   [name] 01/10/2023, 3:13 PM

## 2023-01-11 LAB — CBC
HCT: 23.6 % — ABNORMAL LOW (ref 36.0–46.0)
Hemoglobin: 7.6 g/dL — ABNORMAL LOW (ref 12.0–15.0)
MCH: 27.3 pg (ref 26.0–34.0)
MCHC: 32.2 g/dL (ref 30.0–36.0)
MCV: 84.9 fL (ref 80.0–100.0)
Platelets: 238 10*3/uL (ref 150–400)
RBC: 2.78 MIL/uL — ABNORMAL LOW (ref 3.87–5.11)
RDW: 15.4 % (ref 11.5–15.5)
WBC: 7.7 10*3/uL (ref 4.0–10.5)
nRBC: 0 % (ref 0.0–0.2)

## 2023-01-11 LAB — SURGICAL PATHOLOGY

## 2023-01-11 NOTE — Progress Notes (Signed)
 POSTPARTUM PROGRESS NOTE  POD #2  Subjective:  Diane Lloyd is a 32 y.o. G1P0101 s/p primary LTCS at [redacted]w[redacted]d.  No acute events overnight. She reports she is doing well. She denies any problems with ambulating, voiding or po intake. Denies nausea or vomiting. She has  passed flatus. Pain is moderately controlled.  Lochia is normal.  Objective: Blood pressure 139/73, pulse (!) 101, temperature 98.6 F (37 C), temperature source Oral, resp. rate 17, height 5' 10 (1.778 m), weight 116.6 kg, last menstrual period 06/03/2022, SpO2 99%, unknown if currently breastfeeding.  Physical Exam:  General: alert, cooperative and no distress Chest: no respiratory distress, CTA bilaterally Heart:regular rate and rythym  Abdomen: soft, nontender,  Uterine Fundus: firm, appropriately tender DVT Evaluation: No calf swelling or tenderness Extremities: trace edema Skin: warm, dry; incision clean/dry/intact w/ honeycomb dressing in place  Recent Labs    01/10/23 0424 01/11/23 0448  HGB 8.0* 7.6*  HCT 24.1* 23.6*    Assessment/Plan: Diane Lloyd is a 32 y.o. G1P0101 s/p primary at [redacted]w[redacted]d for low lying placenta and placental abruption.  POD#2 -  Pt desires to be discharged on POD 3 as she is remote from her home and baby in NICU.   She otherwise is stable.  BP is reasonable on current regimen  Dispo: Anticipate discharge on 01/12/23   LOS: 9 days   Jerilynn Buddle, Md Faculty Attending, Center for [employer] 01/11/2023, 10:44 AM

## 2023-01-11 NOTE — Clinical Social Work Maternal (Addendum)
CLINICAL SOCIAL WORK MATERNAL/CHILD NOTE  Patient Details  Name: Diane BRANDEWIE MRN: [id number] Date of Birth: 1990-10-09  Date:  01/11/2023  Clinical Social Worker Initiating Note:  [place], [place] Date/Time: Initiated:  01/10/23/1400     Child's Name:  Diane Lloyd   Biological Parents:  Mother ([name]: Diane Lloyd 1990/03/17)   Need for Interpreter:  None   Reason for Referral:  Other (Comment) (NICU admission)   Address:  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   Homebound arranged:    Surveyor, quantity Resources:  Medicaid   Other Resources:      Cultural/Religious Considerations Which May Impact Care:  Non-Denominational   Strengths:  Ability to meet basic needs  , Home prepared for child  , Psychotropic Medications   Psychotropic Medications:  Zoloft, Other meds (Hydroxyzine PRN)      Pediatrician:       Pediatrician List:   [place]    [place]    [place] [facility]      Pediatrician Fax Number:    Risk Factors/Current Problems:  Mental Health Concerns     Cognitive State:  Able to Concentrate  , Alert  , Goal Oriented  , Insightful     Mood/Affect:  Comfortable  , Sad  , Calm     CSW Assessment: CSW received consult for NICU admission. CSW met with [name] to offer  support and complete assessment.    CSW met with [name] at the bedside, introduced CSW role and explained the purpose of the visit. During the visit, CSW observed that [name] was receiving an iron infusion and was open to the assessment. CSW congratulated [name] on baby boy, Diane Lloyd. CSW inquired about [name]'s demographic information on file at the hospital. [name] stated that she does not plan to return to [place] and will instead reside in [place]. Her current address is [address] [place], [place] [address], where she lives with her mother and stepfather. [name] shared that her mother is her primary support. CSW inquired about FOB. [name] chose not to share information about FOB. CSW asked if [name] received WIC and food stamps. [name] confirmed that she is receives Medicaid, WIC, and food stamps through [facility]. She plans to transfer Medicaid, WIC, and food stamps to [facility]. CSW inquired if [name] had been to NICU. [name] reported that she had been to the NICU, and that staff had provided updates regarding the infant's care.   CSW asked [name] how she has been feeling since giving birth. [name] reported that she has felt "overwhelmed." [name]  explained that she had to undergo and emergent c-section and expressed how difficult it is to see the infant in NICU. CSW validated and normalized [name] emotions. CSW inquired about [name] mental health history. [name] acknowledged that she has a history of anxiety and depression. [name] reported that she takes Zoloft and Hyrdrozyzine as needed to treat her symptom. [name] reported that she feels her symptoms are well managed with medication. [name] reported that she was seeing a therapist but when she went part-time at her work, she lost her private insurance and could not afford to private pay the for sessions.   CSW provided education regarding the baby blues period vs. perinatal mood disorders, discussed treatment and gave resources for mental health that also accept Medicaid. CSW recommended [name]  complete a self-evaluation during the postpartum time period using the New Mom Checklist from Postpartum Progress and encouraged [name] to contact a medical professional if symptoms are noted at any time. [name] reported that she has established follow up care at [facility] with Dr. Despina Hidden and her appointment is on 01/20/2023. [name] reported she feels comfortable reaching out to her medical provider if concern arise. CSW assessed [name] for safety. [name] denied SI/HI and domestic violence.   CSW inquired if [name] had essential items to care for the infant. [name] reported that she has all essential items to care for the infant. CSW asked [name] if she had chosen a pediatrician. [name] asked for assistance with this. CSW agreed to provide [name] with a list of pediatricians.   CSW offered to check in with [name] to offer support and resources to the family while infant remains in NICU. [name] was receptive to social work checking in with her.   CSW will continue to offer support and resources to family while infant remains in NICU.    CSW Plan/Description:  Perinatal Mood and Anxiety Disorder (PMADs) Education, Psychosocial Support and Ongoing Assessment of Needs, No Further Intervention Required/No Barriers to Discharge, Other Information/Referral to [employer], LCSW 01/10/2023, 3:45 PM

## 2023-01-12 ENCOUNTER — Other Ambulatory Visit (HOSPITAL_COMMUNITY): Payer: Self-pay

## 2023-01-12 MED ORDER — OXYCODONE HCL 5 MG PO TABS: 5.0000 mg | ORAL_TABLET | ORAL | 0 refills | Status: DC | PRN

## 2023-01-12 MED ORDER — IBUPROFEN 600 MG PO TABS: 600.0000 mg | ORAL_TABLET | Freq: Four times a day (QID) | ORAL | 0 refills | Status: DC

## 2023-01-12 MED ORDER — FUROSEMIDE 20 MG PO TABS
20.0000 mg | ORAL_TABLET | Freq: Two times a day (BID) | ORAL | 0 refills | Status: DC
  Filled 2023-01-12: qty 5, 3d supply, fill #0

## 2023-01-12 MED ORDER — IBUPROFEN 600 MG PO TABS
600.0000 mg | ORAL_TABLET | Freq: Four times a day (QID) | ORAL | 0 refills | Status: DC
  Filled 2023-01-12: qty 30, 8d supply, fill #0

## 2023-01-12 MED ORDER — OXYCODONE HCL 5 MG PO TABS
5.0000 mg | ORAL_TABLET | ORAL | 0 refills | Status: DC | PRN
  Filled 2023-01-12: qty 30, 3d supply, fill #0

## 2023-01-12 MED ORDER — FUROSEMIDE 20 MG PO TABS: 20.0000 mg | ORAL_TABLET | Freq: Two times a day (BID) | ORAL | 0 refills | Status: DC

## 2023-01-12 NOTE — Discharge Instructions (Signed)

## 2023-01-12 NOTE — Lactation Note (Signed)
 This note was copied from a baby's chart.  NICU Lactation Consultation Note  Patient Name: Boy Treniya Lobb Unijb'd Date: 01/12/2023 Age:31 days  Reason for consult: Follow-up assessment; Infant < 6lbs; NICU baby; Preterm <34wks; Primapara; 1st time breastfeeding; Other (Comment); Maternal endocrine disorder; Maternal discharge (cHTN) Type of Endocrine Disorder?: Diabetes; PCOS (A1GDM)  SUBJECTIVE Visited with family of 31 66/24 weeks old AGA NICU female; Ms. Beever is a P1 and reports she's been pumping and getting small amounts of colostrum, enough to collect, praised her for her efforts. Noticed that pumping hasn't been consistent; she's getting discharged today. Reviewed discharge education, pump settings, flange fitting for her Spectra  pump and the importance of consistent pumping for the onset of lactogenesis II and the protect her supply.   OBJECTIVE Infant data: Mother's Current Feeding Choice: Breast Milk and Donor Milk  O2 Device: Room Air FiO2 (%): 21 %   Maternal data: G1P0101 C-Section, Low Transverse Pumping frequency: 4 times/24 hours Pumped volume: 11 mL Flange Size: 21  WIC Program: Yes WIC Referral Sent?: No What county?: Other ([place] in [place], but she'll be staying here with baby) Pump: Stork Pump (Spectra  S2)  ASSESSMENT Infant: Feeding Status: Trophic feedings Feeding method: Tube/Gavage (Bolus)  Maternal: Milk volume: Normal  INTERVENTIONS/PLAN Interventions: Interventions: Breast feeding basics reviewed; Coconut oil; DEBP; Education Discharge Education: Engorgement and breast care Tools: Pump; Flanges; Coconut oil Pump Education: Setup, frequency, and cleaning; Milk Storage  Plan: Encouraged pumping every 3 hours, ideally 8 pumping sessions/24 hours She'll bring all pump parts to baby's room after her discharge She'll switch her pump setting from initiate to maintain once she starts getting 20 ml of EBM combined She'll get inserts for her  Spectra  pump at home, she's currently a size # 21   No other support person at this time. All questions and concerns answered, family to contact [facility] services PRN.  Consult Status: NICU follow-up NICU Follow-up type: Verify absence of engorgement; Verify onset of copious milk   Deepak Bless S Tanveer Dobberstein 01/12/2023, 11:32 AM

## 2023-01-13 ENCOUNTER — Other Ambulatory Visit (HOSPITAL_COMMUNITY): Payer: Self-pay

## 2023-01-13 ENCOUNTER — Ambulatory Visit (HOSPITAL_COMMUNITY): Payer: Self-pay

## 2023-01-13 NOTE — Lactation Note (Signed)
 This note was copied from a baby's chart.  NICU Lactation Consultation Note  Patient Name: Boy Alanys Godino Unijb'd Date: 01/13/2023 Age:31 days  Reason for consult: Follow-up assessment; NICU baby; RN request; Mother's request; Primapara; 1st time breastfeeding; Other (Comment); Infant < 6lbs; Preterm <34wks; Maternal endocrine disorder (cHTN) Type of Endocrine Disorder?: Diabetes; PCOS (A1GDM)  SUBJECTIVE Visited with family of 23 13/52 weeks old AGA NICU female; Ms. Long is a P1 and called out for lactation because she needed another pumping band, resized her to a L per her request which is a snugger (but not tight) fit for her. She voiced that her Spectra  pump is not as efficient as the Symphony in the hospital. She has moved with her mom and staying in a [place] address, [facility] referral sent today requesting a Symphony pump for baby [name]. Noticed that pumping is still not consistent (see maternal assessment); re-educated about pumping around baby's feeding schedule for the onset of lactogenesis II and the prevention of engorgement.   OBJECTIVE Infant data: Mother's Current Feeding Choice: Breast Milk and Donor Milk  O2 Device: Room Air FiO2 (%): 21 %  Maternal data: G1P0101 C-Section, Low Transverse Pumping frequency: 2 times/24 hours Pumped volume: 5 mL Flange Size: 21 Hands-free pumping top sizes: Large Alejos)  WIC Program: Yes WIC Referral Sent?: Yes What county?: [place] Pump: Stork Pump (Spectra  S2)  ASSESSMENT Infant: Feeding Status: Trophic feedings; Scheduled 9-12-3-6 Feeding method: Tube/Gavage (Bolus)  Maternal: Milk volume: Low Breast are still soft with no S/S of engorgement at this time but breast are TTP and a bit sensitive  INTERVENTIONS/PLAN Interventions: Interventions: Breast feeding basics reviewed; Coconut oil; DEBP; Education Discharge Education: Engorgement and breast care Tools: Pump; Flanges; Coconut oil; Hands-free pumping top Pump  Education: Setup, frequency, and cleaning; Milk Storage  Plan: Encouraged pumping on maintain mode every 3 hours, ideally 8 pumping sessions/24 hours She'll get inserts for her Spectra  pump at home, she's currently a size # 21; she ordered them and they are coming in the mail   No other support person at this time. All questions and concerns answered, family to contact [facility] services PRN.  Consult Status: NICU follow-up NICU Follow-up type: Verify absence of engorgement; Verify onset of copious milk   Zaden Sako S Rodrigo Mcgranahan 01/13/2023, 1:20 PM

## 2023-01-14 ENCOUNTER — Encounter (HOSPITAL_COMMUNITY): Payer: Self-pay | Admitting: Obstetrics & Gynecology

## 2023-01-14 ENCOUNTER — Inpatient Hospital Stay (HOSPITAL_COMMUNITY)
Admission: AD | Admit: 2023-01-14 | Discharge: 2023-01-14 | Disposition: A | Discharge status: Home / Self Care | Payer: Medicaid Other | Attending: Obstetrics & Gynecology | Admitting: Obstetrics & Gynecology

## 2023-01-14 DIAGNOSIS — R519 Headache, unspecified: Secondary | ICD-10-CM | POA: Insufficient documentation

## 2023-01-14 DIAGNOSIS — O165 Unspecified maternal hypertension, complicating the puerperium: Secondary | ICD-10-CM | POA: Insufficient documentation

## 2023-01-14 DIAGNOSIS — O9089 Other complications of the puerperium, not elsewhere classified: Secondary | ICD-10-CM

## 2023-01-14 DIAGNOSIS — I10 Essential (primary) hypertension: Secondary | ICD-10-CM

## 2023-01-14 LAB — COMPREHENSIVE METABOLIC PANEL
ALT: 24 U/L (ref 0–44)
AST: 17 U/L (ref 15–41)
Albumin: 2.8 g/dL — ABNORMAL LOW (ref 3.5–5.0)
Alkaline Phosphatase: 54 U/L (ref 38–126)
Anion gap: 8 (ref 5–15)
BUN: 11 mg/dL (ref 6–20)
CO2: 23 mmol/L (ref 22–32)
Calcium: 8.9 mg/dL (ref 8.9–10.3)
Chloride: 107 mmol/L (ref 98–111)
Creatinine, Ser: 0.88 mg/dL (ref 0.44–1.00)
GFR, Estimated: >60 mL/min (ref >60)
Glucose, Bld: 112 mg/dL — ABNORMAL HIGH (ref 70–99)
Potassium: 3.8 mmol/L (ref 3.5–5.1)
Sodium: 138 mmol/L (ref 135–145)
Total Bilirubin: 0.4 mg/dL (ref 0.0–1.2)
Total Protein: 6.7 g/dL (ref 6.5–8.1)

## 2023-01-14 LAB — CBC
HCT: 29.4 % — ABNORMAL LOW (ref 36.0–46.0)
Hemoglobin: 9.3 g/dL — ABNORMAL LOW (ref 12.0–15.0)
MCH: 27 pg (ref 26.0–34.0)
MCHC: 31.6 g/dL (ref 30.0–36.0)
MCV: 85.2 fL (ref 80.0–100.0)
Platelets: 363 10*3/uL (ref 150–400)
RBC: 3.45 MIL/uL — ABNORMAL LOW (ref 3.87–5.11)
RDW: 15.8 % — ABNORMAL HIGH (ref 11.5–15.5)
WBC: 8 10*3/uL (ref 4.0–10.5)
nRBC: 0 % (ref 0.0–0.2)

## 2023-01-14 MED ORDER — CYCLOBENZAPRINE HCL 5 MG PO TABS
10.0000 mg | ORAL_TABLET | Freq: Once | ORAL | Status: AC
  Administered 2023-01-14: 10 mg via ORAL
  Filled 2023-01-14: qty 2

## 2023-01-14 MED ORDER — ACETAMINOPHEN-CAFFEINE 500-65 MG PO TABS
2.0000 | ORAL_TABLET | Freq: Once | ORAL | Status: AC
  Administered 2023-01-14: 2 via ORAL
  Filled 2023-01-14: qty 2

## 2023-01-14 MED ORDER — CYCLOBENZAPRINE HCL 5 MG PO TABS: 5.0000 mg | ORAL_TABLET | Freq: Three times a day (TID) | ORAL | 0 refills | Status: DC | PRN

## 2023-01-14 NOTE — MAU Provider Note (Signed)
 History     CSN: [id number]  Arrival date and time: 01/14/23 1703   Event Date/Time   First Provider Initiated Contact with Patient 01/14/23 1801      Chief Complaint  Patient presents with   ? low HGB   Hypotension   HPI Diane Lloyd is 33 y.o. G1P0101 female at 5 days postpartum who presents for labs & headache. Pregnancy complicated by hypertension. Delivered by c/section at 31 weeks due to placental abruption. Patient currently staying in NICU with her baby.  She called her cardiologist (in [place]) for an appointment & was instructed to have her hemoglobin checked due to some low blood pressures. Takes procardia  30 BID.  Reports headache since yesterday that she rates 6/10. Took ibuprofen  this morning without relief.   OB History     Gravida  1   Para  1   Term      Preterm  1   AB      Living  1      SAB      IAB      Ectopic      Multiple  0   Live Births  1           Past Medical History:  Diagnosis Date   Anemia    iron  def   Anxiety    Asthma    Diabetes mellitus without complication (HCC)    Family history of cardiac disorder 02/17/2011   Gestational diabetes    History of COVID-19 11/01/2019   Hypertension    Obesity, unspecified    PCOS (polycystic ovarian syndrome)    Positive ANA (antinuclear antibody) 05/05/2017   done by [facility] in [facility]; ANA 1:160, speckled pattern    Past Surgical History:  Procedure Laterality Date   CESAREAN SECTION N/A 01/09/2023   Procedure: CESAREAN SECTION;  Surgeon: Jayne Vonn DEL, MD;  Location: [facility] LD ORS;  Service: Obstetrics;  Laterality: N/A;   TONSILLECTOMY      Family History  Problem Relation Age of Onset   Cancer Father 80       esophagus   Asthma Father    Esophageal cancer Father    Hypertension Mother    Lupus Mother    Diabetes Mother    Migraines Mother    Cancer Maternal Uncle 4       pancreatic cancer   Cancer Paternal Grandmother 72       esophageal    Anxiety disorder Paternal Aunt    Depression Paternal Aunt    Anxiety disorder Cousin    Colon cancer Neg Hx    Ulcerative colitis Neg Hx     Social History   Tobacco Use   Smoking status: Never   Smokeless tobacco: Never  Vaping Use   Vaping status: Never Used  Substance Use Topics   Alcohol use: Not Currently    Comment: occasional   Drug use: Not Currently    Allergies:  Allergies  Allergen Reactions   Diflucan  [Fluconazole ] Itching and Rash    No medications prior to admission.    Review of Systems  All other systems reviewed and are negative.  Physical Exam   Blood pressure 127/69, pulse 89, temperature 98.6 F (37 C), temperature source Oral, resp. rate 18, height 5' 10 (1.778 m), weight 110.2 kg, SpO2 100%, currently breastfeeding.  Physical Exam Vitals and nursing note reviewed.  Constitutional:      General: She is not in acute distress.  Appearance: She is well-developed. She is not ill-appearing.  HENT:     Head: Normocephalic and atraumatic.  Eyes:     General: No scleral icterus.       Right eye: No discharge.        Left eye: No discharge.     Conjunctiva/sclera: Conjunctivae normal.  Pulmonary:     Effort: Pulmonary effort is normal. No respiratory distress.  Neurological:     General: No focal deficit present.     Mental Status: She is alert.  Psychiatric:        Mood and Affect: Mood normal.        Behavior: Behavior normal.     MAU Course  Procedures Results for orders placed or performed during the hospital encounter of 01/14/23 (from the past 24 hours)  Comprehensive metabolic panel     Status: Abnormal   Collection Time: 01/14/23  6:04 PM  Result Value Ref Range   Sodium 138 135 - 145 mmol/L   Potassium 3.8 3.5 - 5.1 mmol/L   Chloride 107 98 - 111 mmol/L   CO2 23 22 - 32 mmol/L   Glucose, Bld 112 (H) 70 - 99 mg/dL   BUN 11 6 - 20 mg/dL   Creatinine, Ser 9.11 0.44 - 1.00 mg/dL   Calcium  8.9 8.9 - 10.3 mg/dL   Total  Protein 6.7 6.5 - 8.1 g/dL   Albumin 2.8 (L) 3.5 - 5.0 g/dL   AST 17 15 - 41 U/L   ALT 24 0 - 44 U/L   Alkaline Phosphatase 54 38 - 126 U/L   Total Bilirubin 0.4 0.0 - 1.2 mg/dL   GFR, Estimated >39 >39 mL/min   Anion gap 8 5 - 15  CBC     Status: Abnormal   Collection Time: 01/14/23  6:04 PM  Result Value Ref Range   WBC 8.0 4.0 - 10.5 K/uL   RBC 3.45 (L) 3.87 - 5.11 MIL/uL   Hemoglobin 9.3 (L) 12.0 - 15.0 g/dL   HCT 70.5 (L) 63.9 - 53.9 %   MCV 85.2 80.0 - 100.0 fL   MCH 27.0 26.0 - 34.0 pg   MCHC 31.6 30.0 - 36.0 g/dL   RDW 84.1 (H) 88.4 - 84.4 %   Platelets 363 150 - 400 K/uL   nRBC 0.0 0.0 - 0.2 %    MDM Excedrin tension & flexeril  given in MAU Labs collected Chart reviewed  Assessment and Plan   1. Postpartum headache   2. Chronic hypertension    -BPs stable. Headache improved with medication. Labs not concerning for PP preeclampsia.  -Hemoglobin improved since being dishcarged -Reviewed reasons to return to MAU  Rocky Satterfield 01/14/2023, 7:29 PM

## 2023-01-14 NOTE — MAU Note (Addendum)
 Diane Lloyd is a 33 y.o. at Unknown here in MAU reporting: her cardiologist told her to come in and get cbc.  She was calling to make an appt with them.  Her BP has been running lower,(110-130's/40-50's) they thought it might be related to the blood loss, so wanted her to get that checked. Reports occ dizziness. Rec'd iron  transfusion, but no blood transfusion.  Still bleeding, few small clots, changing 1-2 a day. Pain is controlled.   Pain score: 6 headache- had it since last night Vitals:   01/14/23 1719 01/14/23 1726  BP: (!) 156/73 134/78  Pulse: 91 89  Resp: 18   Temp: 98.6 F (37 C)   SpO2: 98% 100%     Lab orders placed from triage:

## 2023-01-19 ENCOUNTER — Ambulatory Visit (HOSPITAL_COMMUNITY): Payer: Self-pay

## 2023-01-19 NOTE — Lactation Note (Signed)
 This note was copied from a baby's chart.  NICU Lactation Consultation Note  Patient Name: Boy Trinitee Horgan Unijb'd Date: 01/19/2023 Age:31 days  Reason for consult: Follow-up assessment; NICU baby; Mother's request; RN request; Infant < 6lbs; Other (Comment); Maternal endocrine disorder; 1st time breastfeeding; Primapara; Preterm <34wks; Breastfeeding assistance (cHTN) Type of Endocrine Disorder?: PCOS; Diabetes (A1GDM)  SUBJECTIVE Visited with family of 88 36/59 weeks old AGA NICU female; Ms. Podgurski is a P1 and has been pumping consistently, her supply continues to slowly increase, praised her for her efforts. Noticed that she was using her wearable pump while in the room. NICU RN Izetta B requested assistance for the 3 pm feeding and voiced that baby has been showing feeding cues and Ms. Manrique wanted to try first latch at the breast. Assisted with latching and positioning to make sure she feels comfortable holding baby on her own, but required further assistance. Baby [name] woke up and started cueing again, did suck training with her breast milk (she just finished pumping) prior latching at a pumped breast. This LC took [name] to the L side in cross cradle hold and eventually he opened his mouth and did a few sucks, Ms. Eveland was thrilled, congratulated her on this new step in her journey; attempt documented. Provided another pumping band in size XL per her request and practice how to hold baby during gavage feedings. Reviewed strategies to increase supply, IDF 1/2, pre-feeding activities and anticipatory guidelines.  OBJECTIVE Infant data: Mother's Current Feeding Choice: Breast Milk  O2 Device: Room Air  Infant feeding assessment No data recorded  Maternal data: G1P0101 C-Section, Low Transverse Pumping frequency: 6-7 times/24 hours Pumped volume: 20 mL (+20 ml) Flange Size: 21 Hands-free pumping top sizes: X-Large Dori) (Resized to XL on 01/19/2023 per her request)  WIC Program:  Yes WIC Referral Sent?: Yes What county?: [place] Pump: Stork Pump (Spectra  S2)  ASSESSMENT Infant: Latch: Repeated attempts needed to sustain latch, nipple held in mouth throughout feeding, stimulation needed to elicit sucking reflex. Audible Swallowing: None Type of Nipple: Everted at rest and after stimulation Comfort (Breast/Nipple): Soft / non-tender Hold (Positioning): Full assist, staff holds infant at breast LATCH Score: 5  Feeding Status: Scheduled 9-12-3-6 Feeding method: Breast  Maternal: Milk volume: Low  INTERVENTIONS/PLAN Interventions: Interventions: Breast feeding basics reviewed; Assisted with latch; Breast compression; Adjust position; Support pillows; Coconut oil; DEBP; Education; Infant Driven Feeding Algorithm education Tools: Pump; Flanges; Coconut oil; Hands-free pumping top Pump Education: Setup, frequency, and cleaning; Milk Storage  Plan: Encouraged pumping on maintain mode every 3 hours, ideally 8 pumping sessions/24 hours. She'll try to use the hospital grade pump whenever she's here She'll start power pumping in the AM She'll start working on pre-feeding activities such as paci dips, finger feedings and holding baby during gavage feedings She'll start taking baby [name] to a pumped breast on feeding cues around feeding time and will call for assistance PRN  No other support person at this time. All questions and concerns answered, family to contact [facility] services PRN.  Consult Status: NICU follow-up NICU Follow-up type: Weekly NICU follow up   Jaclynne Baldo S Miriam 01/19/2023, 4:53 PM

## 2023-01-20 ENCOUNTER — Telehealth (HOSPITAL_COMMUNITY): Payer: Self-pay | Admitting: *Deleted

## 2023-01-20 ENCOUNTER — Encounter: Payer: Medicaid Other | Admitting: Obstetrics & Gynecology

## 2023-01-20 NOTE — Telephone Encounter (Signed)
 01/20/2023  Name: [name] MRN: [id number] DOB: 1990/05/27  Reason for Call:  Transition of Care Hospital Discharge Call  Contact Status: Patient Contact Status: Complete  Language assistant needed:          Follow-Up Questions: Do You Have Any Concerns About Your Health As You Heal From Delivery?: No Do You Have Any Concerns About Your Infants Health?: Infant in NICU  Edinburgh Postnatal Depression Scale:  In the Past 7 Days: I have been able to laugh and see the funny side of things.: As much as I always could I have looked forward with enjoyment to things.: As much as I ever did I have blamed myself unnecessarily when things went wrong.: No, never I have been anxious or worried for no good reason.: No, not at all I have felt scared or panicky for no good reason.: No, not at all Things have been getting on top of me.: No, most of the time I have coped quite well I have been so unhappy that I have had difficulty sleeping.: Not at all I have felt sad or miserable.: No, not at all I have been so unhappy that I have been crying.: No, never The thought of harming myself has occurred to me.: Never Van Postnatal Depression Scale Total: 1  PHQ2-9 Depression Scale:     Discharge Follow-up: Edinburgh score requires follow up?: N/A Patient was advised of the following resources:: Breastfeeding Support Group, Support Group  Post-discharge interventions: Reviewed Newborn Safe Sleep Practices  Signature Allean IVAR Carton, RN, 01/20/23, [phone]

## 2023-01-26 ENCOUNTER — Ambulatory Visit (HOSPITAL_COMMUNITY): Payer: Self-pay

## 2023-01-26 NOTE — Lactation Note (Signed)
 This note was copied from a baby's chart.  NICU Lactation Consultation Note  Patient Name: Diane Lloyd Date: 01/26/2023 Age:31 wk.o.  Reason for consult: Weekly NICU follow-up; NICU baby; RN request; Infant < 6lbs; Other (Comment); Maternal endocrine disorder; Primapara; Preterm <34wks; 1st time breastfeeding (cHTN) Type of Endocrine Disorder?: Diabetes; PCOS (A1GDM)  SUBJECTIVE NICU RN Diane Lloyd called for [name] assistance because she noticed that Diane Lloyd supply is still on the lower side. This LC visited with family of 39 49/67 weeks old AGA NICU female; Diane Lloyd voiced that she's been pumping consistently (only skipping the 3 am feeding) and using the pump at the hospital whenever she's here. She tried power pumping but only once. Baby "Diane Lloyd" has also been engaging in some pre-feeding activities, he's been practicing with the no flow nipple at the bed side. She voiced that her nipples are a bit sensitive during pumping, she forgot her coconut oil at home, provided a sample packet. Reviewed strategies to increase supply, pumping schedule and IDF 1.  OBJECTIVE Infant data: Mother's Current Feeding Choice: Breast Milk  O2 Device: Room Air  Infant feeding assessment IDFTS - Readiness: 2   Maternal data: G1P0101 C-Section, Low Transverse Pumping frequency: 7 times/24 hours Pumped volume: 40 mL (40-50 ml but up to 90 ml in the AM) Flange Size: 21 Hands-free pumping top sizes: Diane Lloyd)  WIC Program: Yes WIC Referral Sent?: Yes What county?: [place] Pump: Stork Pump (Spectra  S2)  ASSESSMENT Infant: Feeding Status: Scheduled 9-12-3-6 Feeding method: Tube/Gavage (Bolus)  Maternal: Milk volume: Low  INTERVENTIONS/PLAN Interventions: Interventions: Breast feeding basics reviewed; DEBP; Education; Infant Driven Feeding Algorithm education; Coconut oil Tools: Pump; Flanges; Coconut oil; Hands-free pumping top Pump Education: Setup, frequency, and cleaning; Milk  Storage  Plan: Encouraged pumping on maintain mode every 3 hours, ideally 8 pumping sessions/24 hours She'll start power pumping in the AM for at least a week She'll continue working on pre-feeding activities such as paci dips, finger feedings and holding baby during gavage feedings She'll start taking baby "Diane Lloyd" to a pumped breast on feeding cues around feeding time and will call for assistance PRN   No other support person at this time. All questions and concerns answered, family to contact [name] services PRN.  Consult Status: NICU follow-up NICU Follow-up type: Weekly NICU follow up   Diane Lloyd Bare 01/26/2023, 12:43 PM

## 2023-01-28 ENCOUNTER — Ambulatory Visit (HOSPITAL_COMMUNITY): Payer: Self-pay

## 2023-01-28 NOTE — Lactation Note (Signed)
This note was copied from a baby's chart.  NICU Lactation Consultation Note  Patient Name: Diane Lloyd [id number] Date: 01/28/2023 Age:31 wk.o.  Reason for consult: Follow-up assessment; NICU baby; Primapara; 1st time breastfeeding; Maternal endocrine disorder; Breastfeeding assistance; Mother's request; Infant < 6lbs; Late-preterm 34-36.6wks Type of Endocrine Disorder?: PCOS; Diabetes  SUBJECTIVE  LC in to assist with baby "Diane Lloyd's" first feeding at the breast.  Baby has been showing feeding cues and scoring 1-3 over the last 24 hrs.    LC instructed Mom to pre-pump fully prior to putting baby on the breast.  LC returned and baby was starting to awaken.  Placed baby STS on Mom's chest.  Baby showing some subtle feeding cues.  Mom provided with a rolled up cloth to help with weight of heavy breast.  Pillow support under baby.  LC assisted Mom to securely hold baby's head from ear to ear, and support her breast with other hand.  Baby started acting a little fussy and stiffening up.  LC hand expressed a drop and placed the drop on baby's lips.  He did not root.    LC placed baby STS with Mom in recliner and baby immediately fell asleep.  Reassured Mom that "Diane Lloyd" was acting appropriately.  His cues are still inconsistent, but he will get stronger and able to suck swallow breathe as he matures.  Talked about pumping and then need to be consistent, Mom is missing the 12mn - 5 am time and LC shared that this was when her prolactin hormone is the highest.  Also discussed "power-pumping" once a day.  Baby sleeping STS on Mom's chest while gavage feeding running.  LC suggested Mom pump after baby is finished STS.  LC disassembled and washed, rinsed and placed in separate bin to dry for if Mom decides to pump after STS.    OBJECTIVE Infant data: No data recorded O2 Device: Room Air  Infant feeding assessment IDFTS - Readiness: 3   Maternal data: G1P0101 C-Section, Low  Transverse Pumping frequency: 7 times per 24 hrs, misses the nighttime pumping Pumped volume: 35 mL (35-90) Flange Size: 21 Hands-free pumping top sizes: [place] Guys Chilton Si)  WIC Program: Yes WIC Referral Sent?: Yes What county?: [place] Pump: Stork Pump (Spectra S2)  ASSESSMENT Infant: Latch: Too sleepy or reluctant, no latch achieved, no sucking elicited. Audible Swallowing: None Type of Nipple: Everted at rest and after stimulation Comfort (Breast/Nipple): Soft / non-tender Hold (Positioning): Full assist, staff holds infant at breast LATCH Score: 4  Feeding Status: Scheduled 9-12-3-6 Feeding method: Breast  Maternal: Milk volume: Low  INTERVENTIONS/PLAN Interventions: Interventions: Breast feeding basics reviewed; Breast massage; Skin to skin; Hand express; Adjust position; Support pillows; Position options; Expressed milk; DEBP; Education Tools: Pump; Flanges Pump Education: Setup, frequency, and cleaning; Milk Storage  Plan: Consult Status: NICU follow-up NICU Follow-up type: Weekly NICU follow up   [name] 01/28/2023, 6:25 PM

## 2023-02-01 ENCOUNTER — Ambulatory Visit (HOSPITAL_COMMUNITY): Payer: Self-pay

## 2023-02-01 NOTE — Lactation Note (Signed)
This note was copied from a baby's chart.  NICU Lactation Consultation Note  Patient Name: Diane Lloyd [id number] Date: 02/01/2023 Age:31 wk.o.  Reason for consult: Primapara; 1st time breastfeeding; Maternal endocrine disorder; Other (Comment); Weekly NICU follow-up; Late-preterm 34-36.6wks; RN request; Mother's request; NICU baby; Infant < 6lbs (cHTN) Type of Endocrine Disorder?: Diabetes; PCOS (A1GDM)  SUBJECTIVE Visited with family of 4 74/6 weeks old AGA NICU female; Ms. Delgardo is a P1 and reported she's pumping consistently for baby "Diane Lloyd" her supply has slightly increased, praised her for her efforts. She called out for lactation because she was experiencing nipple pain (see maternal assessment). She said she's using the coconut oil after her pumping sessions and she's been mostly using the pump in baby's room but when she used her own, she has the inserts # 21. Assisted with hand expression and instructed her to rub her EBM after pumping/feedings at the breast to use the breast shells in between pumping session. She noted that the pain is also at rest and not just during pumping, let her know that it might be the shift in hormones. She requested assistance for the 12 pm feeding but baby "Diane Lloyd" didn't wake up. Asked her to call for assistance when needed. Reviewed strategies to increase supply and the benefits of STS care and power pumping.  OBJECTIVE Infant data: Mother's Current Feeding Choice: Breast Milk  O2 Device: Room Air  Infant feeding assessment IDFTS - Readiness: 3   Maternal data: G1P0101 C-Section, Low Transverse Pumping frequency: 8-9 times/24 hours Pumped volume: 60 mL (60-90 ml) Flange Size: 21 Hands-free pumping top sizes: X-Large Chilton Si)  WIC Program: Yes WIC Referral Sent?: Yes What county?: [place] Pump: [place]ork Pump (Spectra S2)  ASSESSMENT Infant: Feeding Status: Scheduled 9-12-3-6 Feeding method: Tube/Gavage (Bolus)  Maternal: Milk volume:  Low Both nipples looked intact upon examination, the L side had small remnants of what might have been scabs but looked WNL. Breast were soft and tissue compressible upon examination.    INTERVENTIONS/PLAN Interventions: Interventions: Breast feeding basics reviewed; Coconut oil; Hand express; DEBP; Shells; Education Tools: Shells; Flanges; Coconut oil; Hands-free pumping top Pump Education: Setup, frequency, and cleaning; Milk Storage  Plan: Encouraged pumping on maintain mode every 3 hours, ideally 8 pumping sessions/24 hours She'll try power pumping in the AM for at least a week She'll start wearing her breast shells and using coconut oil prior pumping She'll continue working on pre-feeding activities such as paci dips, finger feedings and holding baby during gavage feedings She'll start taking baby "Diane Lloyd" to a pumped breast on feeding cues around feeding time and will call for assistance PRN   No other support person at this time. All questions and concerns answered, family to contact [facility] services PRN.  Consult Status: NICU follow-up NICU Follow-up type: Weekly NICU follow up   Loryn Haacke S Philis Nettle 02/01/2023, 12:10 PM

## 2023-02-03 ENCOUNTER — Telehealth: Payer: Medicaid Other | Admitting: Physician Assistant

## 2023-02-03 DIAGNOSIS — R197 Diarrhea, unspecified: Secondary | ICD-10-CM

## 2023-02-03 DIAGNOSIS — N898 Other specified noninflammatory disorders of vagina: Secondary | ICD-10-CM

## 2023-02-03 DIAGNOSIS — R8290 Unspecified abnormal findings in urine: Secondary | ICD-10-CM

## 2023-02-03 NOTE — Progress Notes (Signed)
  Because of thick discharge and diarrhea with symptoms without other urinary symptoms noted except for cloudy urine, I feel your condition warrants further evaluation and I recommend that you be seen in a face-to-face visit.   NOTE: There will be NO CHARGE for this E-Visit   If you are having a true medical emergency, please call 911.     For an urgent face to face visit, [facility] has multiple urgent care centers for your convenience.  Click the link below for the full list of locations and hours, walk-in wait times, appointment scheduling options and driving directions:  Urgent Care - [place], [place], [place], [place], [place], [place]  [place]     Your MyChart E-visit questionnaire answers were reviewed by a board certified advanced clinical practitioner to complete your personal care plan based on your specific symptoms.    Thank you for using e-Visits.

## 2023-02-07 ENCOUNTER — Ambulatory Visit (HOSPITAL_COMMUNITY): Payer: Self-pay

## 2023-02-07 NOTE — Lactation Note (Addendum)
This note was copied from a baby's chart.  NICU Lactation Consultation Note  Patient Name: Diane Lloyd [id number] Date: 02/07/2023 Age:31 wk.o.  Reason for consult: Follow-up assessment; Primapara; 1st time breastfeeding; Breastfeeding assistance; Late-preterm 34-36.6wks; Infant < 6lbs; Maternal endocrine disorder; RN request Type of Endocrine Disorder?: Diabetes; PCOS  SUBJECTIVE  LC in to assist Mom with breastfeeding baby "Diane Lloyd" at the 3pm feeding.  Mom did not pump prior to the session.  Mom reports pumping regularly and having a normal milk supply.  Mom concerned regarding her STORK pump at home as it has missing pieces.  Encouraged to call [employer] for customer service.  Mom is using a hands free pump and isn't happy with it.  But today, she is starting her 72 hr protected breastfeeding with Diane Lloyd.  LC re-faxed a referral to [facility]   Baby awake and showing some feeding cues.  First tried to latch without the nipple shield. LC placed a rolled up cloth under breast to help support it.  Baby placed STS at right breast with pillows under him for support.  Assisted Mom to support baby's head from ear to ear, and support/sandwich her breast with other hand.   After two tries, baby able to attain and sustain a deep areolar latch without the NS, with flanged lips.  After a minute, Diane Lloyd became nutritive with his sucking.  Mom offered support, feet up and support under elbow.  Baby stayed latched for 9 mins and sucked with deep jaw extensions and swallowing noted.  No signs of stress noted, baby relaxed and paced himself well.  Mom's nipple was rounded and not pinched when he came off.  Baby to received 2/3 of gavage per IDF protocol.  Teaching done on how to time his feeding and how to assess a nutritive suck vs a non-nutritive suck on the breast.  Talked about normal LPTI breastfeeding behavior and how some feedings may go better than others.    Mom understands that the nipple shield may  assist with baby's latch when LC not present.  Reviewed how to place onto breast to encouraged nipple pulling into shield.  LC set up a washing and a drying bin.  LC washed some pump parts and provided Mom with larger bottles for pumping directly into.  LC offered to attend tomorrow's 12 noon feeding.  OBJECTIVE Infant data: No data recorded O2 Device: Room Air  Infant feeding assessment IDFTS - Readiness: 2 (breast) IDFTS - Quality: 3   Maternal data: G1P0101 C-Section, Low Transverse Pumping frequency: 8 times per 24 hrs Pumped volume: 90 mL (90-120 ml) Flange Size: 21  WIC Program: Yes WIC Referral Sent?: Yes (resent WIC referral as Mom has WIC in [facility], she did not transfer it) What county[place] Other Therapist, [place] Pump: Stork Pump ([employer] S2)  ASSESSMENT Infant: Latch: Grasps breast easily, tongue down, lips flanged, rhythmical sucking. (LC assisted to sandwich breast for a deeper latch) Audible Swallowing: Spontaneous and intermittent Type of Nipple: Everted at rest and after stimulation Comfort (Breast/Nipple): Soft / non-tender Hold (Positioning): Assistance needed to correctly position infant at breast and maintain latch. LATCH Score: 9  Feeding Status: Scheduled 9-12-3-6 Feeding method: Breast  Maternal: Milk volume: Normal  INTERVENTIONS/PLAN Interventions: Interventions: Breast feeding basics reviewed; Assisted with latch; Skin to skin; Breast massage; Hand express; Breast compression; Adjust position; Support pillows; Position options; DEBP; Education Tools: Pump; Flanges; Hands-free pumping top Pump Education: Setup, frequency, and cleaning; Milk Storage Nipple shield size: 20  Plan: 1-  Mom to do STS with baby for feedings 2- Offer the breast at feeding times with feeding cues, using nipple shield to help with latch prn.  Watch for nutritive sucking and time the length of time baby is feeding. 3- Always pump AFTER baby breastfeeds, to  support a full milk supply.  Consult Status: NICU follow-up NICU Follow-up type: Assist with IDF-2 (Mother does not need to pre-pump before breastfeeding)   [name] 02/07/2023, 3:36 PM

## 2023-02-08 ENCOUNTER — Encounter: Payer: Self-pay | Admitting: Family Medicine

## 2023-02-08 ENCOUNTER — Telehealth: Payer: Medicaid Other | Admitting: Family Medicine

## 2023-02-08 ENCOUNTER — Ambulatory Visit (HOSPITAL_COMMUNITY): Payer: Self-pay

## 2023-02-08 ENCOUNTER — Ambulatory Visit (HOSPITAL_COMMUNITY)
Admission: EM | Admit: 2023-02-08 | Discharge: 2023-02-08 | Disposition: A | Discharge status: Home / Self Care | Payer: Medicaid Other

## 2023-02-08 DIAGNOSIS — R0789 Other chest pain: Secondary | ICD-10-CM

## 2023-02-08 NOTE — Progress Notes (Signed)
Diane Lloyd   Reports chest tightness and trouble getting breath in, advised she go be tested for Flu, as her son is in NICU and she is in and out of the hospital. Exposure is higher.  Patient acknowledged agreement and understanding of the plan.

## 2023-02-08 NOTE — Lactation Note (Signed)
This note was copied from a baby's chart.  NICU Lactation Consultation Note  Patient Name: Diane Lloyd [id number] Date: 02/08/2023 Age:33 years old  Reason for consult: Follow-up assessment; Breastfeeding assistance; Maternal endocrine disorder; Late-preterm 34-36.6wks; NICU baby; Infant < 6lbs; Weekly NICU follow-up; 1st time breastfeeding; Mother's request Type of Endocrine Disorder?: PCOS; Diabetes  SUBJECTIVE  LC in to assist with breastfeeding "Diane Lloyd" at the 3pm feeding.  Mom pumped after the 12 noon assist and expressed 135 ml.  Encouraged Mom to continue her consistent pumping after breast feeding or attempts.  LC tried to assist with hands off.  Mom was concerned that baby had latched to the nipple at the feedings over night.  Mom reports not feeling pinching and was hearing swallows.    Mom able to position baby in football hold (using cloth roll under breast for support).  Educated Mom how to tease baby with nipple (milk drop on tip) to baby's top lip, and wait for a wide gape of baby's mouth.  Once Diane Lloyd opened wide, while sandwiching her breast from the side, LC assisted Mom to bring baby quickly onto the breast.  Baby attained and sustained a deep latch with flanged lips.  Mom taught to use alternate breast compression to increase milk transfer.    Baby fed with nutritive sucking for 13 mins!  1/3 of gavage started.  Mom holding a contented baby after his feeding, STS on her chest.  Mom will pump after feeding is up.  LC washed pump parts so Mom would be ready to pump after  OBJECTIVE Infant data: No data recorded O2 Device: Room Air  Infant feeding assessment IDFTS - Readiness: 3 IDFTS - Quality: 3 (BF quality score)   Maternal data: G1P0101 C-Section, Low Transverse Pumping frequency: Encouraged to pump after baby breastfeeds or attempts Pumped volume: 135 mL Flange Size: 21 Hands-free pumping top sizes: Large Wallace Cullens)  WIC Program: Yes WIC Referral Sent?: Yes  (resent WIC referral as Mom has WIC in [facility], she did not transfer it) What county?: Other Therapist, art) Pump: Stork Pump (Spectra S2)  ASSESSMENT Infant: Latch: Grasps breast easily, tongue down, lips flanged, rhythmical sucking. Audible Swallowing: Spontaneous and intermittent Type of Nipple: Everted at rest and after stimulation Comfort (Breast/Nipple): Soft / non-tender Hold (Positioning): Assistance needed to correctly position infant at breast and maintain latch. (Minimal assistance, mostly talking Mom through it) LATCH Score: 9  Feeding Status: Scheduled 9-12-3-6 Feeding method: Breast; Tube/Gavage (Bolus)  Maternal: Milk volume: Normal  INTERVENTIONS/PLAN Interventions: Interventions: Assisted with latch; Skin to skin; Breast massage; Hand express; Breast compression; Adjust position; Support pillows; Position options; DEBP; Education Tools: Pump; Flanges; Hands-free pumping top Pump Education: Setup, frequency, and cleaning; Milk Storage Nipple shield size: 20  Plan: Consult Status: NICU follow-up NICU Follow-up type: Assist with IDF-2 (Mother does not need to pre-pump before breastfeeding)   [name] 02/08/2023, 3:27 PM

## 2023-02-09 ENCOUNTER — Ambulatory Visit (HOSPITAL_COMMUNITY): Payer: Self-pay

## 2023-02-09 NOTE — Lactation Note (Signed)
This note was copied from a baby's chart.  NICU Lactation Consultation Note  Patient Name: Diane Lloyd Date: 02/09/2023 Age:31 wk.o.  Reason for consult: Weekly NICU follow-up; Breastfeeding assistance; NICU baby; RN request; Late-preterm 34-36.6wks; Maternal endocrine disorder; Primapara; 1st time breastfeeding; Infant < 6lbs Type of Endocrine Disorder?: PCOS; Diabetes (A1GDM)  SUBJECTIVE Visited with family of 25 68/42 weeks old AGA NICU female; NICU RN Tammy asked this LC to assist with the feeding, Ms.  Yuhasz was trying taking baby "Diane Lloyd" to a full breast by the first time. She reported she's pumping but not consistently, she went 14 hours without pumping since last night (see maternal assessment); provided ice packs per her request. SLP Anise Salvo and this LC educated about the importance of consistent pumping in order to do the IDF algorithm and for the prevention of engorgement. Diane Lloyd already nursing when entered the room, he engaged in both, NNS and NS patterns with loud audible swallows upon breast compressions (see LATCH score). He fell asleep at the breast after doing a 10 minutes feeding with the stopwatch; praised her for her efforts. Revised IDF 2, pumping schedule, pumping log, supply/demand and anticipatory guidelines.   OBJECTIVE Infant data: Mother's Current Feeding Choice: Breast Milk and Formula  O2 Device: Room Air  Infant feeding assessment IDFTS - Readiness: 2 IDFTS - Quality: 3 (BF quality score)   Maternal data: G1P0101 C-Section, Low Transverse Pumping frequency: 3 times/24 hours Pumped volume: 90 mL (90-120 ml) Flange Size: 21 Hands-free pumping top sizes: Large Wallace Cullens)  WIC Program: Yes WIC Referral Sent?: Yes (resent WIC referral as Mom has WIC in [facility], she did not transfer it) What county?: Other Therapist, art) Pump: Stork Pump (Spectra S2)  ASSESSMENT Infant: Latch: Grasps breast easily, tongue down, lips flanged, rhythmical  sucking. Audible Swallowing: Spontaneous and intermittent Type of Nipple: Everted at rest and after stimulation Comfort (Breast/Nipple): Soft / non-tender Hold (Positioning): Assistance needed to correctly position infant at breast and maintain latch. LATCH Score: 9  Feeding Status: Scheduled 9-12-3-6; IDF-2 Feeding method: Breast  Maternal: Milk volume: Low No S/S of engorgement at this time but noticed a knot on R breast at 10 O'clock that was TTP but not TTT  INTERVENTIONS/PLAN Interventions: Interventions: Breast feeding basics reviewed; Assisted with latch; Breast compression; DEBP; Education; NICU Pumping Log; Ice Tools: Pump; Flanges; Hands-free pumping top Pump Education: Setup, frequency, and cleaning; Milk Storage  Plan: Encouraged pumping on maintain mode every 3 hours, ideally 8 pumping sessions/24 hours; she'll try to get as close as she can to that number She'll continue taking baby "Diane Lloyd" to a full breast on feeding cues around feeding time and will call for assistance PRN; she understands she needs to pump afterwards She'll ice her breast PRN   No other support person at this time. All questions and concerns answered, family to contact [facility] services PRN.   Consult Status: NICU follow-up NICU Follow-up type: Weekly NICU follow up   Cady Hafen S Nahla Lukin 02/09/2023, 1:19 PM

## 2023-02-12 ENCOUNTER — Ambulatory Visit (HOSPITAL_COMMUNITY): Payer: Self-pay

## 2023-02-12 NOTE — Lactation Note (Signed)
This note was copied from a baby's chart.  NICU Lactation Consultation Note  Patient Name: Diane Lloyd Date: 02/12/2023 Age:31 wk.o.  Reason for consult: Weekly NICU follow-up; NICU baby; Breastfeeding assistance; Late-preterm 34-36.6wks; Maternal endocrine disorder; Primapara; 1st time breastfeeding; Infant < 6lbs Type of Endocrine Disorder?: PCOS; Diabetes (A1GDM)  SUBJECTIVE Visited with family of 57 72/64 weeks old AGA NICU female; Ms. Rynders reported that she has increased her pumping sessions to 4 times/24 hours and she understands that is still not frequent enough, but she's trying to do her best, praised her for her efforts. NICU RN Hailey requested assistance for the 3 pm feeding, Ms. Cercone was concerned that baby "Diane Lloyd" is going so well with his bottles but he's no interested in taking the breast. This LC took baby to the L side in cross cradle hold using NS # 20 and he was able to latch with ease but kept falling asleep and required continues stimulation for sucking. RN voiced he's been very sleepy today, and that he only took 3 ml from his last bottle. Diane Lloyd fell asleep again at the 5 minutes mark but wouldn't wake up this time, left couple engaging in STS care. Asked RN Rolly Salter to gavage a full feeding. Practice NS # 20 placement with Ms. Strausser, and after a few tries she got it on. Asked her to call for latch assistance when needed.   OBJECTIVE Infant data: Mother's Current Feeding Choice: Breast Milk  O2 Device: Room Air  Infant feeding assessment IDFTS - Readiness: 2 IDFTS - Quality: 4   Maternal data: G1P0101 C-Section, Low Transverse Pumping frequency: 4 times/24 hours Pumped volume: 75 mL (more in the AM)  WIC Program: Yes WIC Referral Sent?: Yes (resent WIC referral as Mom has WIC in [facility], she did not transfer it) What county[place] Other Therapist, art) Pump: Stork Pump (Spectra S2)  ASSESSMENT Infant: Latch: Repeated attempts needed to sustain  latch, nipple held in mouth throughout feeding, stimulation needed to elicit sucking reflex. Audible Swallowing: None Type of Nipple: Everted at rest and after stimulation Comfort (Breast/Nipple): Soft / non-tender Hold (Positioning): Assistance needed to correctly position infant at breast and maintain latch. LATCH Score: 6  Feeding Status: Scheduled 9-12-3-6 Feeding method: Breast Nipple Type: Nfant Extra Slow Flow (gold)  Maternal: Milk volume: Low  INTERVENTIONS/PLAN Interventions: Interventions: Breast feeding basics reviewed; Assisted with latch; Skin to skin; Breast massage; Breast compression; Adjust position; Support pillows; Coconut oil; DEBP; Education  Plan: Encouraged pumping on maintain mode every 3 hours, ideally 8 pumping sessions/24 hours; she'll try to get as close as she can to that number She'll continue taking baby "Diane Lloyd" to a full breast on feeding cues around feeding time using NS # 20 and will call for assistance PRN; she understands she needs to pump afterwards STS care whenever possible She'll try power pumping if it fits her schedule   No other support person at this time. All questions and concerns answered, family to contact [facility] services PRN.  Consult Status: NICU follow-up NICU Follow-up type: Weekly NICU follow up   Diane Lloyd S Philis Nettle 02/12/2023, 4:02 PM

## 2023-02-14 ENCOUNTER — Emergency Department (HOSPITAL_COMMUNITY)
Admission: EM | Admit: 2023-02-14 | Discharge: 2023-02-14 | Disposition: A | Discharge status: Home / Self Care | Payer: Medicaid Other | Attending: Emergency Medicine | Admitting: Emergency Medicine

## 2023-02-14 ENCOUNTER — Encounter (HOSPITAL_COMMUNITY): Payer: Self-pay

## 2023-02-14 ENCOUNTER — Emergency Department (HOSPITAL_COMMUNITY): Payer: Medicaid Other

## 2023-02-14 ENCOUNTER — Encounter (HOSPITAL_COMMUNITY): Payer: Self-pay | Admitting: Obstetrics and Gynecology

## 2023-02-14 ENCOUNTER — Inpatient Hospital Stay (HOSPITAL_COMMUNITY)
Admission: AD | Admit: 2023-02-14 | Discharge: 2023-02-14 | Disposition: A | Discharge status: Home / Self Care | Payer: Medicaid Other | Attending: Obstetrics and Gynecology | Admitting: Obstetrics and Gynecology

## 2023-02-14 ENCOUNTER — Other Ambulatory Visit: Payer: Self-pay

## 2023-02-14 ENCOUNTER — Inpatient Hospital Stay (HOSPITAL_COMMUNITY): Payer: Medicaid Other

## 2023-02-14 DIAGNOSIS — O9089 Other complications of the puerperium, not elsewhere classified: Secondary | ICD-10-CM | POA: Diagnosis present

## 2023-02-14 DIAGNOSIS — E119 Type 2 diabetes mellitus without complications: Secondary | ICD-10-CM | POA: Insufficient documentation

## 2023-02-14 DIAGNOSIS — R079 Chest pain, unspecified: Secondary | ICD-10-CM | POA: Insufficient documentation

## 2023-02-14 DIAGNOSIS — I10 Essential (primary) hypertension: Secondary | ICD-10-CM | POA: Diagnosis not present

## 2023-02-14 DIAGNOSIS — Z79899 Other long term (current) drug therapy: Secondary | ICD-10-CM | POA: Insufficient documentation

## 2023-02-14 DIAGNOSIS — R519 Headache, unspecified: Secondary | ICD-10-CM | POA: Diagnosis not present

## 2023-02-14 DIAGNOSIS — R0789 Other chest pain: Secondary | ICD-10-CM | POA: Insufficient documentation

## 2023-02-14 DIAGNOSIS — I159 Secondary hypertension, unspecified: Secondary | ICD-10-CM | POA: Diagnosis not present

## 2023-02-14 DIAGNOSIS — Z711 Person with feared health complaint in whom no diagnosis is made: Secondary | ICD-10-CM

## 2023-02-14 LAB — URINALYSIS, ROUTINE W REFLEX MICROSCOPIC
Bilirubin Urine: NEGATIVE
Glucose, UA: NEGATIVE mg/dL
Ketones, ur: NEGATIVE mg/dL
Leukocytes,Ua: NEGATIVE
Nitrite: NEGATIVE
Protein, ur: NEGATIVE mg/dL
Specific Gravity, Urine: 1.006 (ref 1.005–1.030)
pH: 5 (ref 5.0–8.0)

## 2023-02-14 LAB — PROTEIN / CREATININE RATIO, URINE
Creatinine, Urine: 56 mg/dL
Total Protein, Urine: <6 mg/dL

## 2023-02-14 LAB — HEPATIC FUNCTION PANEL
ALT: 26 U/L (ref 0–44)
AST: 18 U/L (ref 15–41)
Albumin: 3.8 g/dL (ref 3.5–5.0)
Alkaline Phosphatase: 73 U/L (ref 38–126)
Bilirubin, Direct: <0.1 mg/dL (ref 0.0–0.2)
Total Bilirubin: 0.4 mg/dL (ref 0.0–1.2)
Total Protein: 6.9 g/dL (ref 6.5–8.1)

## 2023-02-14 LAB — BASIC METABOLIC PANEL
Anion gap: 9 (ref 5–15)
BUN: 11 mg/dL (ref 6–20)
CO2: 22 mmol/L (ref 22–32)
Calcium: 9.3 mg/dL (ref 8.9–10.3)
Chloride: 108 mmol/L (ref 98–111)
Creatinine, Ser: 0.9 mg/dL (ref 0.44–1.00)
GFR, Estimated: >60 mL/min (ref >60)
Glucose, Bld: 95 mg/dL (ref 70–99)
Potassium: 3.8 mmol/L (ref 3.5–5.1)
Sodium: 139 mmol/L (ref 135–145)

## 2023-02-14 LAB — CBC
HCT: 35.6 % — ABNORMAL LOW (ref 36.0–46.0)
Hemoglobin: 11.2 g/dL — ABNORMAL LOW (ref 12.0–15.0)
MCH: 26.2 pg (ref 26.0–34.0)
MCHC: 31.5 g/dL (ref 30.0–36.0)
MCV: 83.2 fL (ref 80.0–100.0)
Platelets: 301 10*3/uL (ref 150–400)
RBC: 4.28 MIL/uL (ref 3.87–5.11)
RDW: 15.4 % (ref 11.5–15.5)
WBC: 6.6 10*3/uL (ref 4.0–10.5)
nRBC: 0 % (ref 0.0–0.2)

## 2023-02-14 LAB — TROPONIN I (HIGH SENSITIVITY)
Troponin I (High Sensitivity): <2 ng/L (ref <18)
Troponin I (High Sensitivity): <2 ng/L (ref <18)

## 2023-02-14 LAB — MAGNESIUM: Magnesium: 2.1 mg/dL (ref 1.7–2.4)

## 2023-02-14 MED ORDER — KETOROLAC TROMETHAMINE 15 MG/ML IJ SOLN
15.0000 mg | Freq: Once | INTRAMUSCULAR | Status: AC
  Administered 2023-02-14: 15 mg via INTRAVENOUS
  Filled 2023-02-14: qty 1

## 2023-02-14 MED ORDER — IOHEXOL 350 MG/ML SOLN
75.0000 mL | Freq: Once | INTRAVENOUS | Status: AC | PRN
  Administered 2023-02-14: 75 mL via INTRAVENOUS

## 2023-02-14 NOTE — Discharge Instructions (Signed)
Test results today are reassuring.  A referral for cardiology follow-up has been ordered.  You should hear from them in the next couple days to hopefully expedite establishing care with [facility] in the local area.  Return to the emergency department for any new or worsening symptoms of concern.

## 2023-02-14 NOTE — MAU Note (Signed)
Diane Lloyd is a 33 y.o. at here in MAU reporting: OB sent her to rule out blood clot/ PE. Emergency c/s 12/29., placenta abruption.  Baby is good.  OB sent her in, had cardiac work up this morning.  Been having chest pain the last 4 days, pain and heaviness in upper chest. Has been since twice. When she breathes in, she feels congested- hard to get breath.  Feels SOB when she is laying down.  Has also had headaches with it.  Is still on BP medication.  Is breast feeding, has not had a period yet.   Onset of complaint: 4 days ago Pain score: 3 chest Vitals:   02/14/23 1424 02/14/23 1426  BP:  (!) 143/83  Pulse:  63  Resp:  18  Temp:  98 F (36.7 C)  SpO2: 100% 99%      Lab orders placed from triage:

## 2023-02-14 NOTE — MAU Provider Note (Signed)
Chief Complaint:  Chest Pain   HPI   Event Date/Time   First Provider Initiated Contact with Patient 02/14/23 1450      Diane Lloyd is a 33 y.o. G1P0101 at Unknown who presents to maternity admissions reporting chest pain and pressure with SOB ongoing x 4 days. S/P emergent C- Section on 12/29 for placental abruption and infant in NICU - doing well. Patient receives her OB care from provider in [facility]. She is s/p ED Evaluation at 0300 today and was dc'd ( See ED Provider NOTE) Patient did relay information after questioning that she has been moving furniture around and setting up the nursery room for the infant.  Pregnancy Course: NOVANT- CLT/ Delivery here at [facility] and Infant in [facility] NICU   Past Medical History:  Diagnosis Date   Anemia    iron def   Anxiety    Asthma    Diabetes mellitus without complication (HCC)    Family history of cardiac disorder 02/17/2011   Gestational diabetes    History of COVID-19 11/01/2019   Hypertension    Obesity, unspecified    PCOS (polycystic ovarian syndrome)    Positive ANA (antinuclear antibody) 05/05/2017   done by [facility]; ANA 1:160, speckled pattern   OB History  Gravida Para Term Preterm AB Living  1 1  1  1   SAB IAB Ectopic Multiple Live Births     0 1    # Outcome Date GA Lbr Len/2nd Weight Sex Type Anes PTL Lv  1 Preterm 01/09/23 [redacted]w[redacted]d  1660 g M CS-LTranv Spinal  LIV   Past Surgical History:  Procedure Laterality Date   CESAREAN SECTION N/A 01/09/2023   Procedure: CESAREAN SECTION;  Surgeon: Lazaro Arms, MD;  Location: [facility] LD ORS;  Service: Obstetrics;  Laterality: N/A;   TONSILLECTOMY     Family History  Problem Relation Age of Onset   Cancer Father 51       esophagus   Asthma Father    Esophageal cancer Father    Hypertension Mother    Lupus Mother    Diabetes Mother    Migraines Mother    Cancer Maternal Uncle 84       pancreatic cancer   Cancer Paternal Grandmother 40        esophageal   Anxiety disorder Paternal Aunt    Depression Paternal Aunt    Anxiety disorder Cousin    Colon cancer Neg Hx    Ulcerative colitis Neg Hx    Social History   Tobacco Use   Smoking status: Never   Smokeless tobacco: Never  Vaping Use   Vaping status: Never Used  Substance Use Topics   Alcohol use: Not Currently    Comment: occasional   Drug use: Not Currently   Allergies  Allergen Reactions   Diflucan [Fluconazole] Itching and Rash   Medications Prior to Admission  Medication Sig Dispense Refill Last Dose/Taking   ibuprofen (ADVIL) 600 MG tablet Take 1 tablet (600 mg total) by mouth every 6 (six) hours. 30 tablet 0 Past Week   labetalol (NORMODYNE) 200 MG tablet Take 400 mg by mouth 2 (two) times daily.   02/14/2023   NIFEdipine (PROCARDIA-XL/NIFEDICAL-XL) 30 MG 24 hr tablet Take 30 mg by mouth 2 (two) times daily.   02/14/2023   sertraline (ZOLOFT) 100 MG tablet Take 100 mg by mouth daily.   02/14/2023   cyclobenzaprine (FLEXERIL) 5 MG tablet Take 1 tablet (5 mg  total) by mouth 3 (three) times daily as needed (headache). 15 tablet 0    famotidine (PEPCID) 20 MG tablet Take 20 mg by mouth 2 (two) times daily.      furosemide (LASIX) 20 MG tablet Take 1 tablet (20 mg total) by mouth 2 (two) times daily. (Patient not taking: Reported on 02/14/2023) 5 tablet 0 Not Taking   oxyCODONE (OXY IR/ROXICODONE) 5 MG immediate release tablet Take 1-2 tablets (5-10 mg total) by mouth every 4 (four) hours as needed for moderate pain (pain score 4-6). (Patient not taking: Reported on 02/14/2023) 30 tablet 0 Not Taking   pantoprazole (PROTONIX) 40 MG tablet Take 1 tablet (40 mg total) by mouth daily. To reduce stomach acid (Patient not taking: Reported on 02/14/2023) 90 tablet 0 Not Taking    I have reviewed patient's Past Medical Hx, Surgical Hx, Family Hx, Social Hx, medications and allergies.   ROS  Pertinent items noted in HPI and remainder of comprehensive ROS otherwise negative.    PHYSICAL EXAM  Patient Vitals for the past 24 hrs:  BP Temp Temp src Pulse Resp SpO2 Height Weight  02/14/23 1445 130/73 -- -- (!) 59 -- 99 % -- --  02/14/23 1441 133/75 -- -- (!) 59 -- -- -- --  02/14/23 1426 (!) 143/83 98 F (36.7 C) Oral 63 18 99 % 5\' 10"  (1.778 m) 106.4 kg  02/14/23 1424 -- -- -- -- -- 100 % -- --    Constitutional: Well-developed, obese female in no acute distress.  Cardiovascular: normal rate & rhythm, warm and well-perfused Respiratory: normal effort, no problems with respiration noted Chest wall tender to palpation on left lateral upper chest wall GI: Abd soft, non-tender MS: Extremities nontender, no edema, normal ROM Neurologic: Alert and oriented x 4.  GU: no CVA tenderness Pelvic: NA     Labs: Results for orders placed or performed during the hospital encounter of 02/14/23 (from the past 24 hours)  Basic metabolic panel     Status: None   Collection Time: 02/14/23  3:55 AM  Result Value Ref Range   Sodium 139 135 - 145 mmol/L   Potassium 3.8 3.5 - 5.1 mmol/L   Chloride 108 98 - 111 mmol/L   CO2 22 22 - 32 mmol/L   Glucose, Bld 95 70 - 99 mg/dL   BUN 11 6 - 20 mg/dL   Creatinine, Ser 7.82 0.44 - 1.00 mg/dL   Calcium 9.3 8.9 - 95.6 mg/dL   GFR, Estimated >21 >30 mL/min   Anion gap 9 5 - 15  CBC     Status: Abnormal   Collection Time: 02/14/23  3:55 AM  Result Value Ref Range   WBC 6.6 4.0 - 10.5 K/uL   RBC 4.28 3.87 - 5.11 MIL/uL   Hemoglobin 11.2 (L) 12.0 - 15.0 g/dL   HCT 86.5 (L) 78.4 - 69.6 %   MCV 83.2 80.0 - 100.0 fL   MCH 26.2 26.0 - 34.0 pg   MCHC 31.5 30.0 - 36.0 g/dL   RDW 29.5 28.4 - 13.2 %   Platelets 301 150 - 400 K/uL   nRBC 0.0 0.0 - 0.2 %  Troponin I (High Sensitivity)     Status: None   Collection Time: 02/14/23  3:55 AM  Result Value Ref Range   Troponin I (High Sensitivity) <2 <18 ng/L  Magnesium     Status: None   Collection Time: 02/14/23  3:55 AM  Result Value Ref Range   Magnesium 2.1  1.7 - 2.4 mg/dL   Hepatic function panel     Status: None   Collection Time: 02/14/23  3:55 AM  Result Value Ref Range   Total Protein 6.9 6.5 - 8.1 g/dL   Albumin 3.8 3.5 - 5.0 g/dL   AST 18 15 - 41 U/L   ALT 26 0 - 44 U/L   Alkaline Phosphatase 73 38 - 126 U/L   Total Bilirubin 0.4 0.0 - 1.2 mg/dL   Bilirubin, Direct <1.6 0.0 - 0.2 mg/dL   Indirect Bilirubin NOT CALCULATED 0.3 - 0.9 mg/dL  Urinalysis, Routine w reflex microscopic -Urine, Clean Catch     Status: Abnormal   Collection Time: 02/14/23  4:35 AM  Result Value Ref Range   Color, Urine STRAW (A) YELLOW   APPearance CLEAR CLEAR   Specific Gravity, Urine 1.006 1.005 - 1.030   pH 5.0 5.0 - 8.0   Glucose, UA NEGATIVE NEGATIVE mg/dL   Hgb urine dipstick SMALL (A) NEGATIVE   Bilirubin Urine NEGATIVE NEGATIVE   Ketones, ur NEGATIVE NEGATIVE mg/dL   Protein, ur NEGATIVE NEGATIVE mg/dL   Nitrite NEGATIVE NEGATIVE   Leukocytes,Ua NEGATIVE NEGATIVE   RBC / HPF 0-5 0 - 5 RBC/hpf   WBC, UA 0-5 0 - 5 WBC/hpf   Bacteria, UA RARE (A) NONE SEEN   Squamous Epithelial / HPF 0-5 0 - 5 /HPF   Hyaline Casts, UA PRESENT   Protein / creatinine ratio, urine     Status: None   Collection Time: 02/14/23  4:35 AM  Result Value Ref Range   Creatinine, Urine 56 mg/dL   Total Protein, Urine <6 mg/dL   Protein Creatinine Ratio        0.00 - 0.15 mg/mg[Cre]  Troponin I (High Sensitivity)     Status: None   Collection Time: 02/14/23  6:15 AM  Result Value Ref Range   Troponin I (High Sensitivity) <2 <18 ng/L    Imaging:  CT Angio Chest Pulmonary Embolism (PE) W or WO Contrast Result Date: 02/14/2023 CLINICAL DATA:  Postpartum chest pain. High probability for pulmonary embolism. EXAM: CT ANGIOGRAPHY CHEST WITH CONTRAST TECHNIQUE: Multidetector CT imaging of the chest was performed using the standard protocol during bolus administration of intravenous contrast. Multiplanar CT image reconstructions and MIPs were obtained to evaluate the vascular anatomy. RADIATION  DOSE REDUCTION: This exam was performed according to the departmental dose-optimization program which includes automated exposure control, adjustment of the mA and/or kV according to patient size and/or use of iterative reconstruction technique. CONTRAST:  75mL OMNIPAQUE IOHEXOL 350 MG/ML SOLN COMPARISON:  None Available. FINDINGS: Cardiovascular: Satisfactory opacification of pulmonary arteries noted, and no pulmonary emboli identified. No evidence of thoracic aortic dissection or aneurysm. Mediastinum/Nodes: No masses or pathologically enlarged lymph nodes identified. Lungs/Pleura: No pulmonary mass, infiltrate, or effusion. Upper abdomen: No acute findings. Musculoskeletal: No suspicious bone lesions identified. Review of the MIP images confirms the above findings. IMPRESSION: Negative. No evidence of pulmonary embolism or other active disease. Electronically Signed   By: Danae Orleans M.D.   On: 02/14/2023 17:57   DG Chest 2 View Result Date: 02/14/2023 CLINICAL DATA:  Chest pain EXAM: CHEST - 2 VIEW COMPARISON:  08/08/2022 FINDINGS: Normal heart size and mediastinal contours. No acute infiltrate or edema. No effusion or pneumothorax. No acute osseous findings. Artifact from EKG leads. IMPRESSION: No active cardiopulmonary disease. Electronically Signed   By: Tiburcio Pea M.D.   On: 02/14/2023 04:22    MDM & MAU COURSE  MDM:  HIGH   I have reviewed the patient chart and performed the physical exam . I have ordered & interpreted the lab results and reviewed and interpreted the results from CXR and CT imaging and results. No Evidence of PE at this time or acute pathology   Medications ordered as stated below.  A/P as described below.  Counseling and education provided and patient agreeable  with plan as described below. Verbalized understanding.    MAU Course: Orders Placed This Encounter  Procedures   CT Angio Chest Pulmonary Embolism (PE) W or WO Contrast   Discharge patient Discharge  disposition: 01-Home or Self Care; Discharge patient date: 02/14/2023     ASSESSMENT   1. Chest pain, unspecified type   2. Postpartum state   3. Physically well but worried   4. Secondary hypertension     PLAN  Discharge home in stable condition with strict return precautions.   F/U as scheduled   Allergies as of 02/14/2023       Reactions   Diflucan [fluconazole] Itching, Rash        Medication List     TAKE these medications    cyclobenzaprine 5 MG tablet Commonly known as: FLEXERIL Take 1 tablet (5 mg total) by mouth 3 (three) times daily as needed (headache).   famotidine 20 MG tablet Commonly known as: PEPCID Take 20 mg by mouth 2 (two) times daily.   furosemide 20 MG tablet Commonly known as: LASIX Take 1 tablet (20 mg total) by mouth 2 (two) times daily.   ibuprofen 600 MG tablet Commonly known as: ADVIL Take 1 tablet (600 mg total) by mouth every 6 (six) hours.   labetalol 200 MG tablet Commonly known as: NORMODYNE Take 400 mg by mouth 2 (two) times daily.   NIFEdipine 30 MG 24 hr tablet Commonly known as: PROCARDIA-XL/NIFEDICAL-XL Take 30 mg by mouth 2 (two) times daily.   oxyCODONE 5 MG immediate release tablet Commonly known as: Oxy IR/ROXICODONE Take 1-2 tablets (5-10 mg total) by mouth every 4 (four) hours as needed for moderate pain (pain score 4-6).   pantoprazole 40 MG tablet Commonly known as: PROTONIX Take 1 tablet (40 mg total) by mouth daily. To reduce stomach acid   sertraline 100 MG tablet Commonly known as: ZOLOFT Take 100 mg by mouth daily.        Marcell Barlow, MSN, [facility], Center for [employer]

## 2023-02-14 NOTE — ED Provider Notes (Signed)
[place] EMERGENCY DEPARTMENT AT [facility] Provider Note   CSN: [id number] Arrival date & time: 02/14/23  5621     History  Chief Complaint  Patient presents with   Chest Pain   Postpartum Complications    Diane Lloyd is a 33 y.o. female.   Chest Pain Associated symptoms: headache   Patient presents for chest pain.  Medical history includes DM, anemia, anxiety, HTN, GERD, PCOS.  She is currently 5 weeks postpartum.  She does have a history of hypertension and was on blood pressure medications before her pregnancy.  During pregnancy, she was switched to nifedipine and labetalol.  She has continued these medications since her delivery.  Over the past several days, patient has had a left lateral chest pain.  Pain is worsened with movements of arm and palpation.  She has had intermittent headache.  She denies any headache currently.  Recent blood pressures at home have been elevated in the range of 160s SBP.  She has been treating her chest pain with ibuprofen and Tylenol.  Current pain is 4/10 in severity.     Home Medications Prior to Admission medications   Medication Sig Start Date End Date Taking? Authorizing Provider  cyclobenzaprine (FLEXERIL) 5 MG tablet Take 1 tablet (5 mg total) by mouth 3 (three) times daily as needed (headache). 01/14/23   Judeth Horn, NP  famotidine (PEPCID) 20 MG tablet Take 20 mg by mouth 2 (two) times daily.    [provider]  furosemide (LASIX) 20 MG tablet Take 1 tablet (20 mg total) by mouth 2 (two) times daily. 01/12/23   Milas Hock, MD  ibuprofen (ADVIL) 600 MG tablet Take 1 tablet (600 mg total) by mouth every 6 (six) hours. 01/12/23   Milas Hock, MD  labetalol (NORMODYNE) 200 MG tablet Take 400 mg by mouth 2 (two) times daily.    [provider]  NIFEdipine (PROCARDIA-XL/NIFEDICAL-XL) 30 MG 24 hr tablet Take 30 mg by mouth 2 (two) times daily.    [provider]  oxyCODONE (OXY IR/ROXICODONE) 5 MG  immediate release tablet Take 1-2 tablets (5-10 mg total) by mouth every 4 (four) hours as needed for moderate pain (pain score 4-6). 01/12/23   Milas Hock, MD  pantoprazole (PROTONIX) 40 MG tablet Take 1 tablet (40 mg total) by mouth daily. To reduce stomach acid 12/11/19   Storm Frisk, MD  sertraline (ZOLOFT) 100 MG tablet Take 100 mg by mouth daily.    [provider]      Allergies    Diflucan [fluconazole]    Review of Systems   Review of Systems  Cardiovascular:  Positive for chest pain.  Neurological:  Positive for headaches.  All other systems reviewed and are negative.   Physical Exam Updated Vital Signs BP 118/71   Pulse (!) 49   Temp 98.4 F (36.9 C) (Oral)   Resp (!) 21   Ht 5\' 10"  (1.778 m)   Wt 107.5 kg   LMP 01/09/2023 (Exact Date)   SpO2 100%   Breastfeeding Yes   BMI 34.01 kg/m  Physical Exam Vitals and nursing note reviewed.  Constitutional:      General: She is not in acute distress.    Appearance: She is well-developed. She is not ill-appearing, toxic-appearing or diaphoretic.  HENT:     Head: Normocephalic and atraumatic.  Eyes:     Conjunctiva/sclera: Conjunctivae normal.  Cardiovascular:     Rate and Rhythm: Normal rate and regular rhythm.  Heart sounds: No murmur heard. Pulmonary:     Effort: Pulmonary effort is normal.     Breath sounds: Normal breath sounds.  Chest:     Chest wall: Tenderness present.  Abdominal:     Palpations: Abdomen is soft.     Tenderness: There is no abdominal tenderness.  Musculoskeletal:        General: No swelling. Normal range of motion.     Cervical back: Normal range of motion and neck supple.  Skin:    General: Skin is warm and dry.     Coloration: Skin is not cyanotic or pale.  Neurological:     General: No focal deficit present.     Mental Status: She is alert and oriented to person, place, and time.  Psychiatric:        Mood and Affect: Mood normal.        Behavior: Behavior  normal.     ED Results / Procedures / Treatments   Labs (all labs ordered are listed, but only abnormal results are displayed) Labs Reviewed  CBC - Abnormal; Notable for the following components:      Result Value   Hemoglobin 11.2 (*)    HCT 35.6 (*)    All other components within normal limits  URINALYSIS, ROUTINE W REFLEX MICROSCOPIC - Abnormal; Notable for the following components:   Color, Urine STRAW (*)    Hgb urine dipstick SMALL (*)    Bacteria, UA RARE (*)    All other components within normal limits  BASIC METABOLIC PANEL  MAGNESIUM  HEPATIC FUNCTION PANEL  PROTEIN / CREATININE RATIO, URINE  TROPONIN I (HIGH SENSITIVITY)  TROPONIN I (HIGH SENSITIVITY)    EKG EKG Interpretation Date/Time:  Monday February 14 2023 03:47:01 EST Ventricular Rate:  73 PR Interval:  124 QRS Duration:  92 QT Interval:  402 QTC Calculation: 442 R Axis:   59  Text Interpretation: Normal sinus rhythm Confirmed by Gloris Manchester ([id number]) on 02/14/2023 3:49:03 AM  Radiology DG Chest 2 View Result Date: 02/14/2023 CLINICAL DATA:  Chest pain EXAM: CHEST - 2 VIEW COMPARISON:  08/08/2022 FINDINGS: Normal heart size and mediastinal contours. No acute infiltrate or edema. No effusion or pneumothorax. No acute osseous findings. Artifact from EKG leads. IMPRESSION: No active cardiopulmonary disease. Electronically Signed   By: Tiburcio Pea M.D.   On: 02/14/2023 04:22    Procedures Procedures    Medications Ordered in ED Medications  ketorolac (TORADOL) 15 MG/ML injection 15 mg (15 mg Intravenous Given 02/14/23 0449)    ED Course/ Medical Decision Making/ A&P                                 Medical Decision Making Amount and/or Complexity of Data Reviewed Labs: ordered. Radiology: ordered.  Risk Prescription drug management.   This patient presents to the ED for concern of chest pain, this involves an extensive number of treatment options, and is a complaint that carries with it a high  risk of complications and morbidity.  The differential diagnosis includes ACS, pericarditis, musculoskeletal etiology, anxiety   Co morbidities that complicate the patient evaluation  DM, anemia, anxiety, HTN, GERD, PCOS   Additional history obtained:  Additional history obtained from N/A External records from outside source obtained and reviewed including EMR   Lab Tests:  I Ordered, and personally interpreted labs.  The pertinent results include: Baseline anemia, no leukocytosis, normal kidney function, normal  electrolytes, normal liver enzymes, normal platelet count, no proteinuria, normal troponins x 2   Imaging Studies ordered:  I ordered imaging studies including chest x-ray  I independently visualized and interpreted imaging which showed no acute findings I agree with the radiologist interpretation   Cardiac Monitoring: / EKG:  The patient was maintained on a cardiac monitor.  I personally viewed and interpreted the cardiac monitored which showed an underlying rhythm of: Sinus rhythm  Problem List / ED Course / Critical interventions / Medication management  Patient presenting for 3 days of intermittent left-sided chest pain.  On arrival in the ED, vital signs are normal.  Blood pressure is in the slightly elevated range but patient does have a history of hypertension.  She is currently 5 weeks postpartum so preeclampsia is a consideration.  EKG does not show any concerning ST segment abnormalities.  Toradol was ordered for analgesia.  Workup was initiated.  Patient's lab work was reassuring.  Troponins were normal x 2.  There were no markers to suggest preeclampsia or HELLP syndrome.  Patient's blood pressure actually not even elevated while in the ED.  On reassessment, patient resting comfortably.  Pain is improved.  She has previously been followed by [facility] cardiology in [place].  She has relocated to the local area.  She does wish to establish care with [facility].  She has an  appointment in May.  Will place cardiology referral to possibly expedite her establishing care.  At this time, she is stable for discharge. I ordered medication including Toradol for analgesia Reevaluation of the patient after these medicines showed that the patient improved I have reviewed the patients home medicines and have made adjustments as needed   Social Determinants of Health:  Has access to outpatient care        Final Clinical Impression(s) / ED Diagnoses Final diagnoses:  Chest pain, unspecified type    Rx / DC Orders ED Discharge Orders          Ordered    Ambulatory referral to Cardiology       Comments: If you have not heard from the Cardiology office within the next 72 hours please call [phone].   02/14/23 0981              Gloris Manchester, MD 02/14/23 [phone]

## 2023-02-14 NOTE — Progress Notes (Addendum)
Documentation:  Patient presented to the ED this AM here at [facility] and was seen and evaluated for ongoing Chest pain ( Note from RN and Provider in the ED reviewed)   She is a 33 yo s/p C- Section performed 12/29 for placental abruption with h/o  cHTN  . ED work up consisted of cardiac w/up without CTPA was completed and patient was discharge with out patient follow up Cardiology.   She presented to [facility] Triage with same complaints and SOB.  Orders were placed upon notification from RN and I was just notified that patient has left AMA w/o CTPA. Unit currently in High Acuity.

## 2023-02-14 NOTE — Discharge Instructions (Signed)
Follow up with your OB or list or providers in this area at your convenience  [place] [facility] Providers    Center for [facility] at [employer] for [facility]    Phone: [phone]  Center for [employer] at [place]   Phone: [phone]  Center for [employer] at [place]  Phone: [phone]  Center for [employer] [facility] at [employer]  Phone: [phone]  Center for [facility] at [place]  Phone: [phone]  Center for [facility] at [facility]   Phone: [phone]  [facility]       Phone: [phone]  [facility]    Phone: [phone]   [facility]    Phone: [phone]  [facility]    Phone: [phone]  [facility]    Phone: [phone]  [facility] Department-Family Planning       Phone: [phone]   [facility] Department-Maternity  Phone: [phone]  [facility]    Phone: [phone]  [facility] [place]   Phone: [phone]  [facility]      Phone: [phone]  [facility]    Phone: [phone]

## 2023-02-14 NOTE — ED Triage Notes (Signed)
Pt coming in via pov with chest pain and high blood pressure. Pt reports blood pressure at home was 160/92. Pt reports having placental abruption at 31 weeks causing her to have a emergency c section. Baby was delivered at [facility] on January 09, 2023. Pt reports this was her first pregnancy. Baby is currently in the nicu. Pt reports chest pain is dull and achy. Pt also reports heavyness in chest. Pt reports 4/10 pain. Pt reports shortness of breath with exertion.

## 2023-02-15 ENCOUNTER — Ambulatory Visit: Payer: Medicaid Other | Attending: Cardiology | Admitting: Cardiology

## 2023-02-15 ENCOUNTER — Encounter: Payer: Self-pay | Admitting: Cardiology

## 2023-02-15 VITALS — BP 124/76 | HR 86 | Resp 16 | Ht 70.0 in | Wt 236.0 lb

## 2023-02-15 DIAGNOSIS — M940 Chondrocostal junction syndrome [Tietze]: Secondary | ICD-10-CM

## 2023-02-15 DIAGNOSIS — I493 Ventricular premature depolarization: Secondary | ICD-10-CM

## 2023-02-15 DIAGNOSIS — R002 Palpitations: Secondary | ICD-10-CM | POA: Diagnosis not present

## 2023-02-15 NOTE — Patient Instructions (Signed)

## 2023-02-15 NOTE — Progress Notes (Signed)
 Cardiology Office Note:  .   Date:  02/15/2023  ID:  Diane Lloyd, DOB 25-Dec-1990, MRN [id number] PCP: Daniel Dwane Cordella Diane Lloyd  [facility] Providers Cardiologist:  Gordy Bergamo, MD   History of Present Illness: .   Diane Lloyd is a 33 y.o. female with history of HTN, obesity, asthma, PCOS with hyperglycemia, and chronic anemia, previously seen Dr. Wilbert Bihari in 2021 and relegated to [place] and returns back to [place].  She is presently 5 weeks postpartum and presented with chest pain felt to be musculoskeletal yesterday on 02/14/2023, CT angiogram chest revealed no abnormality and no coronary atherosclerosis or aortic dissection as well and no pulmonary embolism.  This is in fact a second CT angiogram previously performed on 11/26/2022 in [place].  She has also noticed her blood pressure to be elevated.   In the past she has had an extensive cardiac workup with coronary CTA on 02/09/2019 showing no CAD and calcium  score of 0, normal cardiac MRI, normal echo and rare PVCs on heart monitor.  Presents here for evaluation of chest pain, palpitations and chronic dyspnea on exertion.  No PND orthopnea.  Chest pain is described as sharp and worse on taking deep breath and is localized to the upper left chest area.  Discussed the use of AI scribe software for clinical note transcription with the patient, who gave verbal consent to proceed.  History of Present Illness   Diane Lloyd is a 33 year old female with obesity, PCOS, and prediabetes who presents with chest pain.  She is five weeks postpartum and experiencing ongoing chest pain that began prior to her emergency department visit yesterday. The pain is intermittent, located in the chest, radiating to the back and shoulder, and associated with a sensation of heaviness and difficulty catching her breath. It worsens with deep breaths. Although less severe today, it persists. She notes elevated blood pressure,  particularly at night, but it is normal today.  She experiences frequent palpitations, which have become more noticeable recently. A history of wearing a heart monitor in 2021 and December 2024 showed similar findings of extra heartbeats. She associates these palpitations with stress, lack of sleep, and caffeine  intake.  During her pregnancy, she gained weight, reaching nearly 270 pounds, but is currently losing weight. Her past medical history includes obesity, polycystic ovarian syndrome (PCOS), and prediabetes. She acknowledges the challenges of managing her weight and diet postpartum.  Her social history includes the consumption of multiple sodas daily, with a preference for Coke, and occasional ice cream. She is currently breastfeeding and reports some difficulty with sleep, describing it as 'borderline'.     Labs   Lab Results  Component Value Date   CHOL 183 11/01/2019   HDL 46 11/01/2019   LDLCALC 117 (H) 11/01/2019   TRIG 112 11/01/2019   CHOLHDL 4.0 11/01/2019   Lab Results  Component Value Date   NA 139 02/14/2023   K 3.8 02/14/2023   CO2 22 02/14/2023   GLUCOSE 95 02/14/2023   BUN 11 02/14/2023   CREATININE 0.90 02/14/2023   CALCIUM  9.3 02/14/2023   GFRNONAA >60 02/14/2023      Latest Ref Rng & Units 02/14/2023    3:55 AM 01/14/2023    6:04 PM 12/12/2022   10:42 PM  BMP  Glucose 70 - 99 mg/dL 95  887  85   BUN 6 - 20 mg/dL 11  11  5    Creatinine 0.44 - 1.00 mg/dL 9.09  0.88  0.61   Sodium 135 - 145 mmol/L 139  138  135   Potassium 3.5 - 5.1 mmol/L 3.8  3.8  3.4   Chloride 98 - 111 mmol/L 108  107  109   CO2 22 - 32 mmol/L 22  23  18    Calcium  8.9 - 10.3 mg/dL 9.3  8.9  8.9       Latest Ref Rng & Units 02/14/2023    3:55 AM 01/14/2023    6:04 PM 01/11/2023    4:48 AM  CBC  WBC 4.0 - 10.5 K/uL 6.6  8.0  7.7   Hemoglobin 12.0 - 15.0 g/dL 88.7  9.3  7.6   Hematocrit 36.0 - 46.0 % 35.6  29.4  23.6   Platelets 150 - 400 K/uL 301  363  238    Review of Systems   Cardiovascular:  Positive for chest pain, dyspnea on exertion and palpitations. Negative for leg swelling.    Physical Exam:   VS:  BP 124/76 (BP Location: Right Arm, Patient Position: Sitting, Cuff Size: Normal)   Pulse 86   Resp 16   Ht 5' 10 (1.778 m)   Wt 236 lb (107 kg)   LMP 01/09/2023 (Exact Date)   SpO2 98%   BMI 33.86 kg/m    Wt Readings from Last 3 Encounters:  02/15/23 236 lb (107 kg)  02/14/23 234 lb 8 oz (106.4 kg)  02/14/23 237 lb (107.5 kg)    Physical Exam Constitutional:      Appearance: She is obese.  Neck:     Vascular: No carotid bruit or JVD.  Cardiovascular:     Rate and Rhythm: Normal rate and regular rhythm.     Pulses: Intact distal pulses.     Heart sounds: Normal heart sounds. No murmur heard.    No gallop.  Pulmonary:     Effort: Pulmonary effort is normal.     Breath sounds: Normal breath sounds.  Chest:  Breasts:    Right: Tenderness (left 2nd through 4th costochondral tenderness) present.  Abdominal:     General: Bowel sounds are normal.     Palpations: Abdomen is soft.  Musculoskeletal:     Right lower leg: No edema.     Left lower leg: No edema.     Studies Reviewed: Diane Lloyd    Holter monitor 08/03/2022 for 48 hours: The predominant rhythm was Sinus *The Maximum Heart Rate recorded was 143 bpm, 06/28 21:59:24, the Minimum Heart Rate recorded was 53 bpm, 06/29 07:36:12, and the Average Heart Rate was 86 bpm. *There were 10 VE beats with a burden of <1 %. *There were 8 Patient Triggers.  Otherwise unremarkable Holter monitor.  Coronary angiogram 10/29/2021: Angiographically normal coronary arteries.  LVEDP 17 mmHg.  Echocardiogram 04/22/2021: Normal LV systolic function, EF 60 to 65%. No significant valvular abnormality. Normal aorta.  No evidence of pericardial effusion.  EKG:         EKG 02/14/2023: Normal sinus rhythm at rate of 65 bpm, normal EKG.  Medications and allergies    Allergies  Allergen Reactions   Diflucan   [Fluconazole ] Itching and Rash     Current Outpatient Medications:    cyclobenzaprine  (FLEXERIL ) 5 MG tablet, Take 1 tablet (5 mg total) by mouth 3 (three) times daily as needed (headache)., Disp: 15 tablet, Rfl: 0   famotidine  (PEPCID ) 20 MG tablet, Take 20 mg by mouth 2 (two) times daily., Disp: , Rfl:    ibuprofen  (ADVIL ) 600 MG tablet,  Take 1 tablet (600 mg total) by mouth every 6 (six) hours., Disp: 30 tablet, Rfl: 0   labetalol  (NORMODYNE ) 200 MG tablet, Take 400 mg by mouth 2 (two) times daily., Disp: , Rfl:    NIFEdipine  (PROCARDIA -XL/NIFEDICAL-XL) 30 MG 24 hr tablet, Take 30 mg by mouth 2 (two) times daily., Disp: , Rfl:    sertraline  (ZOLOFT ) 100 MG tablet, Take 100 mg by mouth daily., Disp: , Rfl:    ASSESSMENT AND PLAN: .      ICD-10-CM   1. Costochondritis  M94.0     2. Palpitations  R00.2     3. PVC (premature ventricular contraction)  I49.3      Assessment and Plan    Costochondritis Intermittent chest pain radiating to the back and shoulder, exacerbated by deep breaths. Tenderness noted on palpation. Previous cardiac catheterization showed normal arteries, ruling out cardiac etiology. Explained that the condition is due to inflammation of the cartilage between the ribs and breastbone, common in young women, and can be triggered by physical activities. Discussed that the pain can be managed with NSAIDs and ice packs, and that it will likely improve with age as cartilage becomes calcific. - Take ibuprofen  or Aleve  for a couple of days to reduce inflammation - Apply ice packs to the affected area - Educate on the nature of costochondritis and its management - Advise against frequent ER visits for this condition  Palpitations Frequent palpitations, identified as PVCs from previous monitoring. Likely exacerbated by stress, lack of sleep, caffeine  intake, and obesity. Explained that palpitations are often benign but can be reduced by addressing lifestyle factors. - Reduce  caffeine  intake, she is drinking approximately 3 Coca-Cola's a day plus other drinks. - Improve sleep hygiene - Encourage weight loss through diet and exercise  Hypertension Reported elevated blood pressure, particularly at night. Contributing factors include lack of sleep, stress from caring for a newborn, and high soda intake. Discussed the impact of lifestyle factors on blood pressure. Emphasized the importance of reducing soda intake due to high sugar content, which contributes to weight gain and hypertension. - Reduce soda intake - Encourage better sleep hygiene - Monitor blood pressure regularly  Obesity Current weight approximately 270 lbs. Discussed the importance of weight loss, especially during breastfeeding, which can aid in calorie expenditure. Emphasized the long-term health risks associated with obesity, including increased risk of various cancers and cardiovascular diseases. Explained that breastfeeding is an opportunity to lose weight as it burns additional calories. - Encourage dietary modifications to reduce calorie intake - Promote regular physical activity - Utilize breastfeeding period for weight loss  General Health Maintenance Discussed the importance of lifestyle modifications to improve overall health and prevent future complications. Emphasized the need for a balanced diet, regular exercise, and avoiding sugary drinks. Highlighted the risks of high sugar intake from sodas and the benefits of maintaining a healthy weight to reduce the risk of chronic diseases like breast cancer, ovarian cancer, colonic cancer but also chronic coronary artery disease, diabetes and other disease states.  She will see me on a as needed basis.  Signed,  Gordy Bergamo, MD, [facility] 02/15/2023, 4:18 PM [facility] [address] [place], [place] [address] Phone: [phone]. Fax:  [phone]

## 2023-02-16 ENCOUNTER — Inpatient Hospital Stay (HOSPITAL_COMMUNITY)
Admission: AD | Admit: 2023-02-16 | Discharge: 2023-02-17 | Disposition: A | Discharge status: Home / Self Care | Payer: Medicaid Other | Attending: Obstetrics & Gynecology | Admitting: Obstetrics & Gynecology

## 2023-02-16 DIAGNOSIS — Z79899 Other long term (current) drug therapy: Secondary | ICD-10-CM | POA: Insufficient documentation

## 2023-02-16 DIAGNOSIS — O9089 Other complications of the puerperium, not elsewhere classified: Secondary | ICD-10-CM | POA: Insufficient documentation

## 2023-02-16 DIAGNOSIS — R519 Headache, unspecified: Secondary | ICD-10-CM | POA: Insufficient documentation

## 2023-02-16 DIAGNOSIS — I10 Essential (primary) hypertension: Secondary | ICD-10-CM

## 2023-02-16 DIAGNOSIS — O1093 Unspecified pre-existing hypertension complicating the puerperium: Secondary | ICD-10-CM | POA: Insufficient documentation

## 2023-02-16 DIAGNOSIS — Z98891 History of uterine scar from previous surgery: Secondary | ICD-10-CM | POA: Diagnosis not present

## 2023-02-16 MED ORDER — CYCLOBENZAPRINE HCL 5 MG PO TABS
5.0000 mg | ORAL_TABLET | Freq: Once | ORAL | Status: AC
  Administered 2023-02-16: 5 mg via ORAL
  Filled 2023-02-16: qty 1

## 2023-02-16 MED ORDER — ACETAMINOPHEN-CAFFEINE 500-65 MG PO TABS
2.0000 | ORAL_TABLET | Freq: Once | ORAL | Status: AC
  Administered 2023-02-16: 2 via ORAL
  Filled 2023-02-16 (×2): qty 2

## 2023-02-16 NOTE — MAU Provider Note (Signed)
 Chief Complaint:  Hypertension and Headache   Event Date/Time   First Provider Initiated Contact with Patient 02/16/23 2319       HPI: Diane Lloyd is a 33 y.o. G1P0101 who is 5 weeks postpartum.  .  presents to maternity admissions reporting increased BP readings at home. States they randomly go up   Has headaches partially relieved by Tylenol   . Has a history of chronic hypertension  States is on three meds outside of pregnancy.  Recently had her Procardia  increased.  Took it at 6pm and took Labetalol  at 9pm. Diane is still in NICU.  Hypertension This is a chronic problem. The problem has been waxing and waning since onset. Associated symptoms include headaches. Pertinent negatives include no blurred vision or chest pain. There are no compliance problems.   Headache  The problem has been waxing and waning. The quality of the pain is described as aching and dull. Pertinent negatives include no blurred vision. Her past medical history is significant for hypertension.   RN Note: .Diane Lloyd is a 33 y.o. following emergency CS on 01/09/2023 here in MAU reporting: headaches and random increases in  BP.   At home 166/90 states she is taking - Labetalol  and Procardia  as ordered.  Headache has been present for 4 days-has been taking tylenol  decreases then returns.  Denies visual changes or epigastric pain   Past Medical History: Past Medical History:  Diagnosis Date   Anemia    iron  def   Anxiety    Asthma    Diabetes mellitus without complication (HCC)    Family history of cardiac disorder 02/17/2011   Gestational diabetes    History of COVID-19 11/01/2019   Hypertension    Obesity, unspecified    PCOS (polycystic ovarian syndrome)    Positive ANA (antinuclear antibody) 05/05/2017   done by [facility] in [facility]; ANA 1:160, speckled pattern    Past obstetric history: OB History  Gravida Para Term Preterm AB Living  1 1  1  1   SAB IAB Ectopic Multiple Live Births      0 1    # Outcome Date GA Lbr Len/2nd Weight Sex Type Anes PTL Lv  1 Preterm 01/09/23 [redacted]w[redacted]d  1660 g M CS-LTranv Spinal  LIV    Past Surgical History: Past Surgical History:  Procedure Laterality Date   CESAREAN SECTION N/A 01/09/2023   Procedure: CESAREAN SECTION;  Surgeon: Jayne Vonn DEL, MD;  Location: [facility] LD ORS;  Service: Obstetrics;  Laterality: N/A;   TONSILLECTOMY      Family History: Family History  Problem Relation Age of Onset   Cancer Father 22       esophagus   Asthma Father    Esophageal cancer Father    Hypertension Mother    Lupus Mother    Diabetes Mother    Migraines Mother    Cancer Maternal Uncle 26       pancreatic cancer   Cancer Paternal Grandmother 30       esophageal   Anxiety disorder Paternal Aunt    Depression Paternal Aunt    Anxiety disorder Cousin    Colon cancer Neg Hx    Ulcerative colitis Neg Hx     Social History: Social History   Tobacco Use   Smoking status: Never   Smokeless tobacco: Never  Vaping Use   Vaping status: Never Used  Substance Use Topics   Alcohol use: Not Currently    Comment: occasional  Drug use: Not Currently    Allergies:  Allergies  Allergen Reactions   Diflucan  [Fluconazole ] Itching and Rash    Meds:  Medications Prior to Admission  Medication Sig Dispense Refill Last Dose/Taking   cyclobenzaprine  (FLEXERIL ) 5 MG tablet Take 1 tablet (5 mg total) by mouth 3 (three) times daily as needed (headache). 15 tablet 0    famotidine  (PEPCID ) 20 MG tablet Take 20 mg by mouth 2 (two) times daily.      ibuprofen  (ADVIL ) 600 MG tablet Take 1 tablet (600 mg total) by mouth every 6 (six) hours. 30 tablet 0    labetalol  (NORMODYNE ) 200 MG tablet Take 400 mg by mouth 2 (two) times daily.      NIFEdipine  (PROCARDIA -XL/NIFEDICAL-XL) 30 MG 24 hr tablet Take 30 mg by mouth 2 (two) times daily.      sertraline  (ZOLOFT ) 100 MG tablet Take 100 mg by mouth daily.       I have reviewed patient's Past Medical Hx,  Surgical Hx, Family Hx, Social Hx, medications and allergies.  ROS:  Review of Systems  Eyes:  Negative for blurred vision.  Cardiovascular:  Negative for chest pain.  Neurological:  Positive for headaches.   Other systems negative     Physical Exam  Patient Vitals for the past 24 hrs:  BP Temp Temp src Pulse Resp SpO2 Height Weight  02/16/23 2309 136/75 98.2 F (36.8 C) Oral 74 17 100 % 5' 10 (1.778 m) 107 kg   Vitals:   02/16/23 2309 02/16/23 2333 02/16/23 2346 02/17/23 0001  BP: 136/75 114/63 124/67 133/73    Constitutional: Well-developed, well-nourished female in no acute distress.  Cardiovascular: normal rate  Respiratory: normal effort, no distress.  GI: Abd soft, non-tender.  Nondistended.  No rebound,  MS: Extremities nontender, no edema, normal ROM Neurologic: Alert and oriented x 4.   Grossly nonfocal. GU: Neg CVAT. Skin:  Warm and Dry Psych:  Affect appropriate.   Labs: No results found for this or any previous visit (from the past 24 hours).  --/--/O POS (12/28 2343)  Imaging:    MAU Course/MDM: I have reviewed the triage vital signs and the nursing notes.   Pertinent labs & imaging results that were available during my care of the patient were reviewed by me and considered in my medical decision making (see chart for details).      I have reviewed her medical records including past results, notes and treatments.   Treatments in MAU included Excedrin Tension and Flexeril  for headache Reviewed serial BPs.  They are all within normal limits  Discussed that the BP meds she took in the evening are probably just now starting to work together.  .   Pt stable at time of discharge.  Assessment: Postpartum x 5 weeks Chronic hypertension, improved  Plan: Discharge home Recommend Discuss with OB when she goes for PP visit next week Will likely stay on these meds until she stops breast feeding.    Encouraged to return here or to other Urgent Care/ED if  she develops worsening of symptoms, increase in pain, fever, or other concerning symptoms.   Diane Lloyd CNM, MSN Certified Nurse-Midwife 02/16/2023 11:19 PM

## 2023-02-16 NOTE — MAU Note (Signed)
.  Diane Lloyd is a 33 y.o. following emergency CS on 01/09/2023 here in MAU reporting: headaches and random increases in  BP.   At home 166/90 states she is taking - Labetalol  and Procardia  as ordered.  Headache has been present for 4 days-has been taking tylenol  decreases then returns.  Denies visual changes or epigastric pain  Pain score: headache 6 entire head Vitals:   02/16/23 2309  BP: 136/75  Pulse: 74  Resp: 17  Temp: 98.2 F (36.8 C)  SpO2: 100%

## 2023-02-17 DIAGNOSIS — Z98891 History of uterine scar from previous surgery: Secondary | ICD-10-CM

## 2023-02-17 DIAGNOSIS — R519 Headache, unspecified: Secondary | ICD-10-CM

## 2023-02-17 DIAGNOSIS — I10 Essential (primary) hypertension: Secondary | ICD-10-CM

## 2023-02-18 ENCOUNTER — Ambulatory Visit (HOSPITAL_COMMUNITY): Payer: Self-pay

## 2023-02-18 NOTE — Lactation Note (Addendum)
 This note was copied from a baby's chart.  NICU Lactation Consultation Note  Patient Name: Diane Lloyd Unijb'd Date: 02/18/2023 Age:31 wk.o.  Reason for consult: Weekly NICU follow-up; NICU baby; Breastfeeding assistance; Maternal endocrine disorder; Mother's request; RN request; Primapara; 1st time breastfeeding; Infant < 6lbs; Early term 37-38.6wks; Other (Comment) (Weighted feed per SLP request) Type of Endocrine Disorder?: Diabetes; PCOS (A1GDM)  SUBJECTIVE Visited with family of 42 41/15 weeks old AGA NICU female twice, the first time to check on pumping status and the second time for a feeding assist with a weighted feed. Ms. Diane Lloyd reported she's pumping but now back to 4 times/24 hours; she's aware she should be pumping more often. Baby Diane Lloyd is taking at least 50% of his PO feeds, sometimes he finishes up the whole bottle. Provided another pumping band in size XL per her request prior latch assistance. This LC took Diane Lloyd to the bare L side in cross cradle hold, he was sleepy and engaged mostly in NNS patterns during this 10 minutes feeding (see LATCH score).  Pre-weight = 2710 g. Post-wright = 2714 g. Net transfer = 4 ml  Asked NICU RN Hannah P to gavage the rest per IDF protocol. Explained to Diane Lloyd that he'll probably do better if he's wide awake and alert, encouraged her to call out for latch assistance as needed.   OBJECTIVE Infant data: Mother's Current Feeding Choice: Breast Milk  O2 Device: Room Air  Infant feeding assessment IDFTS - Readiness: 1 IDFTS - Quality: 3   Maternal data: G1P0101 C-Section, Low Transverse Pumping frequency: 4 times/24 hours Pumped volume: 60 mL Hands-free pumping top sizes: Brena Dori)  WIC Program: Yes WIC Referral Sent?: Yes (resent WIC referral as Mom has WIC in [facility], she did not transfer it) What county[place] Other Therapist, [place]) Pump: Stork Pump (Spectra  S2)  ASSESSMENT Infant: Latch: Repeated attempts  needed to sustain latch, nipple held in mouth throughout feeding, stimulation needed to elicit sucking reflex. (sleepy baby) Audible Swallowing: A few with stimulation Type of Nipple: Everted at rest and after stimulation Comfort (Breast/Nipple): Soft / non-tender Hold (Positioning): Assistance needed to correctly position infant at breast and maintain latch. LATCH Score: 7  Feeding Status: Scheduled 9-12-3-6; IDF-2 Feeding method: Breast Nipple Type: Dr. Jonna Preemie  Maternal: Milk volume: Low  INTERVENTIONS/PLAN Interventions: Interventions: Breast feeding basics reviewed; Assisted with latch; Skin to skin; Breast massage; Hand express; Adjust position; Support pillows; Breast compression; DEBP; Education; Weighted feed Tools: Hands-free pumping top  Plan: Encouraged pumping on maintain mode every 3 hours, ideally 8 pumping sessions/24 hours; she'll try to get as close as she can to that number She'll continue taking baby Diane Lloyd to a full breast on feeding cues around feeding time using NS # 20 and will call for assistance PRN; she understands she needs to pump afterwards STS care whenever possible She'll try power pumping if it fits her schedule   No other support person at this time. All questions and concerns answered, family to contact [facility] services PRN.  Consult Status: NICU follow-up NICU Follow-up type: Weekly NICU follow up   Rosa Lloyd S Miriam 02/18/2023, 6:05 PM

## 2023-03-07 ENCOUNTER — Encounter: Payer: Self-pay | Admitting: Family Medicine

## 2023-03-07 ENCOUNTER — Ambulatory Visit: Payer: Medicaid Other | Admitting: Family Medicine

## 2023-03-07 VITALS — BP 128/80 | HR 66 | Temp 98.0°F | Ht 70.0 in | Wt 241.2 lb

## 2023-03-07 DIAGNOSIS — R519 Headache, unspecified: Secondary | ICD-10-CM | POA: Diagnosis not present

## 2023-03-07 DIAGNOSIS — G8929 Other chronic pain: Secondary | ICD-10-CM | POA: Diagnosis not present

## 2023-03-07 MED ORDER — UBRELVY 50 MG PO TABS: ORAL_TABLET | ORAL | 1 refills | Status: DC

## 2023-03-07 MED ORDER — METHYLPREDNISOLONE ACETATE 80 MG/ML IJ SUSP
80.0000 mg | Freq: Once | INTRAMUSCULAR | Status: AC
  Administered 2023-03-07: 80 mg via INTRAMUSCULAR

## 2023-03-07 MED ORDER — KETOROLAC TROMETHAMINE 60 MG/2ML IM SOLN
60.0000 mg | Freq: Once | INTRAMUSCULAR | Status: AC
Start: 2023-03-07 — End: 2023-03-07
  Administered 2023-03-07: 60 mg via INTRAMUSCULAR

## 2023-03-07 NOTE — Progress Notes (Signed)
 New Patient Office Visit  Subjective    Patient ID: Diane Lloyd, female    DOB: 03/21/1990  Age: 33 y.o. MRN: [id number]  CC:  Chief Complaint  Patient presents with   Establish Care    Establish Care. Patient notes of chronic migraines for the past week. Notes she just had a child and is dealing with lack of sleep. Currently treating with tylenol which she notes hasn't been working that well. Patient does have past history of migraines    HPI Diane Lloyd presents to establish care today. Reports that she has been having migraines recently.  She is 2 weeks post partum, not sleeping well and thinks this is contributing. Has hx  migraines. Has tried sumatriptan in the past with little relief.  Reports phonophobia, photophobia.  Denies abdominal pain, nausea, vomiting, diarrhea, rash, fever, other symptoms. Has previously taken sumatriptan, states that this did not really help with her headaches. Has been taking Tylenol, has not taken anything this morning. Denies any numbness, tingling, loss of strength in extremities, vision changes, dizziness, other concerns.    Outpatient Encounter Medications as of 03/07/2023  Medication Sig   cyclobenzaprine (FLEXERIL) 5 MG tablet Take 1 tablet (5 mg total) by mouth 3 (three) times daily as needed (headache).   famotidine (PEPCID) 20 MG tablet Take 20 mg by mouth 2 (two) times daily.   ibuprofen (ADVIL) 600 MG tablet Take 1 tablet (600 mg total) by mouth every 6 (six) hours.   labetalol (NORMODYNE) 200 MG tablet Take 400 mg by mouth 2 (two) times daily.   NIFEdipine (PROCARDIA-XL/NIFEDICAL-XL) 30 MG 24 hr tablet Take 30 mg by mouth 2 (two) times daily.   sertraline (ZOLOFT) 100 MG tablet Take 100 mg by mouth daily.   Ubrogepant (UBRELVY) 50 MG TABS Take one tablet when you feel a headache, take 2nd tablet if still present in 2 hours. No more than 2 doses in 24 hours.   [EXPIRED] ketorolac (TORADOL) injection 60 mg    [EXPIRED]  methylPREDNISolone acetate (DEPO-MEDROL) injection 80 mg    No facility-administered encounter medications on file as of 03/07/2023.    Past Medical History:  Diagnosis Date   Allergy    Anemia    iron def   Anxiety    Asthma    Depression    Diabetes mellitus without complication (HCC)    Family history of cardiac disorder 02/17/2011   GERD (gastroesophageal reflux disease)    Gestational diabetes    History of COVID-19 11/01/2019   Hypertension    Obesity, unspecified    PCOS (polycystic ovarian syndrome)    Positive ANA (antinuclear antibody) 05/05/2017   done by [facility] in [facility]; ANA 1:160, speckled pattern    Past Surgical History:  Procedure Laterality Date   CESAREAN SECTION N/A 01/09/2023   Procedure: CESAREAN SECTION;  Surgeon: Lazaro Arms, MD;  Location: [facility] LD ORS;  Service: Obstetrics;  Laterality: N/A;   TONSILLECTOMY      Family History  Problem Relation Age of Onset   Cancer Father 32       esophagus   Asthma Father    Esophageal cancer Father    Hypertension Mother    Lupus Mother    Diabetes Mother    Migraines Mother    Cancer Maternal Uncle 48       pancreatic cancer   Cancer Paternal Grandmother 49       esophageal   Anxiety disorder Paternal Aunt  Depression Paternal Aunt    Anxiety disorder Cousin    Colon cancer Neg Hx    Ulcerative colitis Neg Hx     Social History   Socioeconomic History   Marital status: Single    Spouse name: Not on file   Number of children: 0   Years of education: Not on file   Highest education level: Some college, no degree  Occupational History   Occupation: CSR    Employer: [employer]  Tobacco Use   Smoking status: Never   Smokeless tobacco: Never  Vaping Use   Vaping status: Never Used  Substance and Sexual Activity   Alcohol use: Not Currently    Comment: occasional   Drug use: Never   Sexual activity: Not Currently    Birth control/protection: None  Other Topics Concern    Not on file  Social History Narrative   Patient is right-handed. She lives alone. She occasionally drinks caffeine. She does not exercise.   Social Drivers of Corporate investment banker Strain: Low Risk  (03/04/2023)   Overall Financial Resource Strain (CARDIA)    Difficulty of Paying Living Expenses: Not hard at all  Food Insecurity: No Food Insecurity (03/04/2023)   Hunger Vital Sign    Worried About Running Out of Food in the Last Year: Never true    Ran Out of Food in the Last Year: Never true  Transportation Needs: No Transportation Needs (03/04/2023)   PRAPARE - Administrator, Civil Service (Medical): No    Lack of Transportation (Non-Medical): No  Physical Activity: Unknown (03/04/2023)   Exercise Vital Sign    Days of Exercise per Week: 0 days    Minutes of Exercise per Session: Not on file  Recent Concern: Physical Activity - Inactive (02/03/2023)   Received from [facility]   Exercise Vital Sign    Days of Exercise per Week: 0 days    Minutes of Exercise per Session: 60 min  Stress: Stress Concern Present (03/04/2023)   [employer] of Occupational Health - Occupational Stress Questionnaire    Feeling of Stress : To some extent  Social Connections: Moderately Isolated (03/04/2023)   Social Connection and Isolation Panel [NHANES]    Frequency of Communication with Friends and Family: More than three times a week    Frequency of Social Gatherings with Friends and Family: More than three times a week    Attends Religious Services: 1 to 4 times per year    Active Member of [employer] or Organizations: No    Attends Engineer, structural: Not on file    Marital Status: Never married  Intimate Partner Violence: Not At Risk (02/03/2023)   Received from [facility], Afraid, Rape, and Kick questionnaire    Fear of Current or Ex-Partner: No    Emotionally Abused: No    Physically Abused: No    Sexually Abused: No    ROS Per HPI       Objective    BP 128/80   Pulse 66   Temp 98 F (36.7 C)   Ht 5\' 10"  (1.778 m)   Wt 241 lb 3.2 oz (109.4 kg)   LMP 01/09/2023 (Exact Date)   SpO2 99%   Breastfeeding Yes   BMI 34.61 kg/m   Physical Exam Vitals and nursing note reviewed.  Constitutional:      General: She is not in acute distress.    Appearance: Normal appearance. She is normal weight.  HENT:  Head: Normocephalic and atraumatic.     Nose: Nose normal.  Eyes:     Extraocular Movements: Extraocular movements intact.     Pupils: Pupils are equal, round, and reactive to light.  Cardiovascular:     Rate and Rhythm: Normal rate and regular rhythm.     Heart sounds: Normal heart sounds. No murmur heard. Pulmonary:     Effort: Pulmonary effort is normal. No respiratory distress.     Breath sounds: Normal breath sounds. No wheezing, rhonchi or rales.  Musculoskeletal:        General: Normal range of motion.     Cervical back: Normal range of motion.  Skin:    General: Skin is warm and dry.  Neurological:     General: No focal deficit present.     Mental Status: She is alert and oriented to person, place, and time.  Psychiatric:        Mood and Affect: Mood normal.        Thought Content: Thought content normal.         Assessment & Plan:   Chronic nonintractable headache, unspecified headache type -     methylPREDNISolone Acetate -     Ketorolac Tromethamine Bernita Raisin; Take one tablet when you feel a headache, take 2nd tablet if still present in 2 hours. No more than 2 doses in 24 hours.  Dispense: 9 tablet; Refill: 1  Ubrelvy 100 mg dose sample given instructed to break pill in half and use 50 mg dose when she feels a headache coming on.  May take second dose in 2 hours if headache is still present. Do not take more than 2 doses in 24 hours.  Return in about 4 weeks (around 04/04/2023) for recheck migraines, meds.   Moshe Cipro, FNP

## 2023-03-07 NOTE — Patient Instructions (Addendum)
 Welcome to [facility]!  We are checking labs today, will be in contact with any results that require further attention  Results will automatically be available in MyChart. If there is anything that we need to follow up on, or re check, we will be calling to let you know.  You have received an injection of Toradol in office today for pain.  Do not take any NSAIDs including aspirin, Aleve, ibuprofen, Celebrex, meloxicam within the next 6 hours after your injection.  You have received a steroid injection in the office today.  We have given you samples of Nurtec today. May take this at the start of your migraine. May take one tablet 2 hours later if  you still have a headache. No more than 2 tabs in 24 hours.   Follow up with me in a month recheck migraines.

## 2023-03-08 ENCOUNTER — Encounter: Payer: Self-pay | Admitting: Family Medicine

## 2023-03-08 DIAGNOSIS — G8929 Other chronic pain: Secondary | ICD-10-CM

## 2023-03-10 NOTE — Progress Notes (Signed)
 Patient did not connect for virtual psychiatric medication management appointment on 03/12/23 at 8AM. Sent secure video link with no response. Called phone with no answer; left VM with callback number to reschedule.  Daine Gip, MD 03/12/23

## 2023-03-12 ENCOUNTER — Encounter (HOSPITAL_COMMUNITY): Payer: Self-pay | Admitting: Psychiatry

## 2023-03-12 ENCOUNTER — Encounter (HOSPITAL_COMMUNITY): Payer: Self-pay

## 2023-03-14 ENCOUNTER — Ambulatory Visit
Admission: RE | Admit: 2023-03-14 | Discharge: 2023-03-14 | Disposition: A | Discharge status: Home / Self Care | Payer: Medicaid Other | Source: Ambulatory Visit | Attending: Family Medicine | Admitting: Family Medicine

## 2023-03-14 ENCOUNTER — Ambulatory Visit: Payer: Medicaid Other | Admitting: Podiatry

## 2023-03-14 ENCOUNTER — Ambulatory Visit: Payer: Medicaid Other | Admitting: Family Medicine

## 2023-03-14 ENCOUNTER — Encounter: Payer: Self-pay | Admitting: Family Medicine

## 2023-03-14 DIAGNOSIS — R519 Headache, unspecified: Secondary | ICD-10-CM

## 2023-03-16 ENCOUNTER — Ambulatory Visit: Admitting: Podiatry

## 2023-03-16 ENCOUNTER — Encounter: Payer: Self-pay | Admitting: Podiatry

## 2023-03-16 DIAGNOSIS — L600 Ingrowing nail: Secondary | ICD-10-CM

## 2023-03-16 NOTE — Patient Instructions (Signed)

## 2023-03-17 ENCOUNTER — Encounter: Payer: Self-pay | Admitting: Family Medicine

## 2023-03-17 DIAGNOSIS — A609 Anogenital herpesviral infection, unspecified: Secondary | ICD-10-CM

## 2023-03-17 NOTE — Progress Notes (Signed)
 Subjective:   Patient ID: Diane Lloyd, female   DOB: 33 y.o.   MRN: [id number]   HPI Patient presents with nail disease is found to have moderate damage of the left big toenail and on the right there is an ingrown lateral border with spicule and pain formation.  Patient does not currently smoke and has just had a child and likes to be active   Review of Systems  All other systems reviewed and are negative.       Objective:  Physical Exam Vitals and nursing note reviewed.  Constitutional:      Appearance: She is well-developed.  Pulmonary:     Effort: Pulmonary effort is normal.  Musculoskeletal:        General: Normal range of motion.  Skin:    General: Skin is warm.  Neurological:     Mental Status: She is alert.     Neurovascular status intact muscle strength found to be adequate range of motion within normal limits with incurvated lateral border of the right big toenail that is sore when pressed and make shoe gear difficult.  Patient also noted to have some thickness of the left hallux nail but not currently sore     Assessment:  Ingrown toenail deformity right hallux damaged left hallux nailbed with thickness     Plan:  H&P reviewed both conditions.  I do think that we could consider at 1 point removal of the left hallux nail and I did discuss permanent removal with her in great detail organ to hold off on that as she does use it as an anchor.  I then went ahead and I recommended correction of the right hallux nail patient wants this done understanding surgery and I allowed her to read then signed consent form.  I infiltrated the right big toe 60 mg Xylocaine Marcaine mixture sterile prep done using sterile instrumentation removed the lateral border exposed matrix applied phenol 3 applications 30 seconds followed by alcohol lavage sterile dressing gave instructions on soaks and to wear dressing 24 hours take it off earlier if throbbing were to occur

## 2023-03-18 MED ORDER — VALACYCLOVIR HCL 500 MG PO TABS
500.0000 mg | ORAL_TABLET | Freq: Every day | ORAL | 1 refills | Status: AC
Start: 2023-03-18 — End: ?

## 2023-04-04 ENCOUNTER — Ambulatory Visit: Payer: Medicaid Other | Admitting: Family Medicine

## 2023-04-04 NOTE — Progress Notes (Deleted)
   Established Patient Office Visit  Subjective   Patient ID: Diane Lloyd, female    DOB: May 06, 1990  Age: 33 y.o. MRN: [id number]  No chief complaint on file.   HPI Patient presents today for medication management, recheck migraines. Reports compliance with medication regimen. Denies the need for refills. Not fasting today. Denies other concerns. Medical history as outlined below.  ROS Per HPI    Objective:     There were no vitals taken for this visit.  Physical Exam Vitals and nursing note reviewed.  Constitutional:      General: She is not in acute distress.    Appearance: Normal appearance. She is normal weight.  HENT:     Head: Normocephalic and atraumatic.     Nose: Nose normal.  Eyes:     Extraocular Movements: Extraocular movements intact.     Pupils: Pupils are equal, round, and reactive to light.  Cardiovascular:     Rate and Rhythm: Normal rate and regular rhythm.     Heart sounds: Normal heart sounds.  Pulmonary:     Effort: Pulmonary effort is normal. No respiratory distress.     Breath sounds: Normal breath sounds. No wheezing, rhonchi or rales.  Musculoskeletal:        General: Normal range of motion.     Cervical back: Normal range of motion.  Lymphadenopathy:     Cervical: No cervical adenopathy.  Neurological:     General: No focal deficit present.     Mental Status: She is alert and oriented to person, place, and time.  Psychiatric:        Mood and Affect: Mood normal.        Thought Content: Thought content normal.    No results found for any visits on 04/04/23.   The ASCVD Risk score (Arnett DK, et al., 2019) failed to calculate for the following reasons:   The 2019 ASCVD risk score is only valid for ages 75 to 110    Assessment & Plan:   There are no diagnoses linked to this encounter.   No follow-ups on file.    Moshe Cipro, FNP

## 2023-04-05 ENCOUNTER — Ambulatory Visit: Admitting: Family Medicine

## 2023-04-06 ENCOUNTER — Telehealth: Payer: Self-pay

## 2023-04-06 ENCOUNTER — Encounter: Payer: Self-pay | Admitting: Family Medicine

## 2023-04-06 ENCOUNTER — Ambulatory Visit: Admitting: Family Medicine

## 2023-04-06 NOTE — Telephone Encounter (Signed)
 Patient has called, and cancelled/rescheduled her last four appointments on the same day of her scheduled visits. Provider Moshe Cipro would like for patient to be dismissed. Routing to IT consultant as well.

## 2023-04-13 ENCOUNTER — Ambulatory Visit: Payer: Self-pay

## 2023-04-13 NOTE — Telephone Encounter (Signed)
 1st attempt Received VMB. LVM to return call. Will continue to attempt.

## 2023-04-13 NOTE — Telephone Encounter (Signed)
 Pt called in but states she spoke with another nurse.

## 2023-04-13 NOTE — Telephone Encounter (Signed)
 This RN made second attempt to contact patient with no answer. A voicemail was left with call back number provided.

## 2023-04-13 NOTE — Telephone Encounter (Signed)
  Chief Complaint: migraine  Symptoms: headache, jaw pain   Disposition: [] ED /[] Urgent Care (no appt availability in office) / [x] Appointment(In office/virtual)/ []  [place] [place] [facility]/ [] Home Care/ [] Refused Recommended Disposition /[] [facility]/ []  Follow-up with PCP Additional Notes: Pt calling with migraine. Pt is 3 moths postpartum. Pt has seen a provider over this and contributed them to lack of sleep. Head hurts over forehead and back of head. Pt states her jaw is starting to hurt when she yawns.  Pt stated they were getting better with increased sleep but pt is losing sleep again. Pt has been dismissed from practice and pt didn't want establish care within [facility]. Pt stated she will look in to [facility]. RN encouraged her to call back if she changes her mind.              Copied from CRM [id number]. Topic: Clinical - Pink Word Triage >> Apr 13, 2023  4:16 PM Sim Boast F wrote: Reason for Triage: Patient is still having migraines and wants to know what to do moving forward >> Apr 13, 2023  4:36 PM [place] J wrote: Patient missed call from triage. Warm transferred back. Reason for Disposition  [1] MODERATE headache (e.g., interferes with normal activities) AND [2] present > 24 hours AND [3] unexplained  (Exceptions: analgesics not tried, typical migraine, or headache part of viral illness)  Answer Assessment - Initial Assessment Questions 1. LOCATION: "Where does it hurt?"      Across forehead, going down neck on both sides  2. ONSET: "When did the headache start?" (Minutes, hours or days)      2 days 3. PATTERN: "Does the pain come and go, or has it been constant since it started?"     Constant  4. SEVERITY: "How bad is the pain?" and "What does it keep you from doing?"  (e.g., Scale 1-10; mild, moderate, or severe)   - MILD (1-3): doesn't interfere with normal activities    - MODERATE (4-7): interferes with normal activities or awakens from sleep    - SEVERE  (8-10): excruciating pain, unable to do any normal activities        5 5. RECURRENT SYMPTOM: "Have you ever had headaches before?" If Yes, ask: "When was the last time?" and "What happened that time?"      Yes  6. CAUSE: "What do you think is causing the headache?"     Hormonal  7. MIGRAINE: "Have you been diagnosed with migraine headaches?" If Yes, ask: "Is this headache similar?"      yes  9. OTHER SYMPTOMS: "Do you have any other symptoms?" (fever, stiff neck, eye pain, sore throat, cold symptoms)     Jaw pain  Protocols used: Headache-A-AH

## 2023-05-24 ENCOUNTER — Ambulatory Visit: Payer: Medicaid Other | Admitting: Internal Medicine

## 2023-06-06 ENCOUNTER — Emergency Department (HOSPITAL_BASED_OUTPATIENT_CLINIC_OR_DEPARTMENT_OTHER)
Admission: EM | Admit: 2023-06-06 | Discharge: 2023-06-06 | Disposition: A | Discharge status: Home / Self Care | Attending: Emergency Medicine | Admitting: Emergency Medicine

## 2023-06-06 ENCOUNTER — Other Ambulatory Visit: Payer: Self-pay

## 2023-06-06 ENCOUNTER — Encounter (HOSPITAL_BASED_OUTPATIENT_CLINIC_OR_DEPARTMENT_OTHER): Payer: Self-pay | Admitting: Emergency Medicine

## 2023-06-06 DIAGNOSIS — J45909 Unspecified asthma, uncomplicated: Secondary | ICD-10-CM | POA: Insufficient documentation

## 2023-06-06 DIAGNOSIS — E119 Type 2 diabetes mellitus without complications: Secondary | ICD-10-CM | POA: Diagnosis not present

## 2023-06-06 DIAGNOSIS — I10 Essential (primary) hypertension: Secondary | ICD-10-CM | POA: Diagnosis not present

## 2023-06-06 DIAGNOSIS — Z8616 Personal history of COVID-19: Secondary | ICD-10-CM | POA: Diagnosis not present

## 2023-06-06 DIAGNOSIS — R1032 Left lower quadrant pain: Secondary | ICD-10-CM | POA: Insufficient documentation

## 2023-06-06 LAB — URINALYSIS, ROUTINE W REFLEX MICROSCOPIC
Bilirubin Urine: NEGATIVE
Glucose, UA: NEGATIVE mg/dL
Ketones, ur: NEGATIVE mg/dL
Nitrite: NEGATIVE
Protein, ur: NEGATIVE mg/dL
Specific Gravity, Urine: 1.027 (ref 1.005–1.030)
pH: 5.5 (ref 5.0–8.0)

## 2023-06-06 LAB — PREGNANCY, URINE: Preg Test, Ur: NEGATIVE

## 2023-06-06 NOTE — ED Triage Notes (Signed)
  Patient comes in with LLQ abdominal pain that started around an hour ago.  Patient states she ate a few brownies and started to feel the pain.  Has nausea with no vomiting.  Had solid BM yesterday.  No OTC meds taken at home.  Pain 6/10, aching.

## 2023-06-06 NOTE — Discharge Instructions (Signed)
 You were evaluated in the Emergency Department and after careful evaluation, we did not find any emergent condition requiring admission or further testing in the hospital.  Your exam/testing today is overall reassuring.  Urine sample did not show any signs of infection.  Recommend use of Tylenol  and Motrin  for discomfort, keep a close eye in your symptoms, follow-up with your primary care if it does not go away.  Please return to the Emergency Department if you experience any worsening of your condition.   Thank you for allowing us  to be a part of your care.

## 2023-06-06 NOTE — ED Provider Notes (Signed)
 [facility]Y [facility] Emergency Department Provider Note MRN:  [id number]  Arrival date & time: 06/06/23     Chief Complaint   Abdominal Pain   History of Present Illness   Diane Lloyd is a 33 y.o. year-old female with a history of diabetes, GERD, PCOS presenting to the ED with chief complaint of abdominal pain.  Left lower quadrant abdominal pain and/or left hip pain for the past 4 hours, concerned her and so here for evaluation.  Worse with movement of the hip.  Denies nausea or vomiting, no diarrhea or constipation, no fever.  Patient has some burning after urination.  No vaginal bleeding or leakage of fluid or discharge.  4 months postpartum.  Review of Systems  A thorough review of systems was obtained and all systems are negative except as noted in the HPI and PMH.   Patient's Health History    Past Medical History:  Diagnosis Date   Allergy    Anemia    iron  def   Anxiety    Asthma    Depression    Diabetes mellitus without complication (HCC)    Family history of cardiac disorder 02/17/2011   GERD (gastroesophageal reflux disease)    Gestational diabetes    History of COVID-19 11/01/2019   Hypertension    Obesity, unspecified    PCOS (polycystic ovarian syndrome)    Positive ANA (antinuclear antibody) 05/05/2017   done by [facility] in [facility]; ANA 1:160, speckled pattern    Past Surgical History:  Procedure Laterality Date   CESAREAN SECTION N/A 01/09/2023   Procedure: CESAREAN SECTION;  Surgeon: Wendelyn Halter, MD;  Location: [facility] LD ORS;  Service: Obstetrics;  Laterality: N/A;   TONSILLECTOMY      Family History  Problem Relation Age of Onset   Cancer Father 71       esophagus   Asthma Father    Esophageal cancer Father    Hypertension Mother    Lupus Mother    Diabetes Mother    Migraines Mother    Cancer Maternal Uncle 36       pancreatic cancer   Cancer Paternal Grandmother 81       esophageal   Anxiety disorder  Paternal Aunt    Depression Paternal Aunt    Anxiety disorder Cousin    Colon cancer Neg Hx    Ulcerative colitis Neg Hx     Social History   Socioeconomic History   Marital status: Single    Spouse name: Not on file   Number of children: 0   Years of education: Not on file   Highest education level: Some college, no degree  Occupational History   Occupation: CSR    Employer: [employer]  Tobacco Use   Smoking status: Never   Smokeless tobacco: Never  Vaping Use   Vaping status: Never Used  Substance and Sexual Activity   Alcohol use: Not Currently    Comment: occasional   Drug use: Never   Sexual activity: Not Currently    Birth control/protection: None  Other Topics Concern   Not on file  Social History Narrative   Patient is right-handed. She lives alone. She occasionally drinks caffeine . She does not exercise.   Social Drivers of Health   Financial Resource Strain: Low Risk  (03/04/2023)   Overall Financial Resource Strain (CARDIA)    Difficulty of Paying Living Expenses: Not hard at all  Food Insecurity: No Food Insecurity (03/04/2023)   Hunger Vital Sign  Worried About Programme researcher, broadcasting/film/video in the Last Year: Never true    Ran Out of Food in the Last Year: Never true  Transportation Needs: No Transportation Needs (03/04/2023)   PRAPARE - Administrator, Civil Service (Medical): No    Lack of Transportation (Non-Medical): No  Physical Activity: Unknown (03/04/2023)   Exercise Vital Sign    Days of Exercise per Week: 0 days    Minutes of Exercise per Session: Not on file  Recent Concern: Physical Activity - Inactive (02/03/2023)   Received from [facility]   Exercise Vital Sign    Days of Exercise per Week: 0 days    Minutes of Exercise per Session: 60 min  Stress: Stress Concern Present (03/04/2023)   [employer] of Occupational Health - Occupational Stress Questionnaire    Feeling of Stress : To some extent  Social Connections: Moderately  Isolated (03/04/2023)   Social Connection and Isolation Panel [NHANES]    Frequency of Communication with Friends and Family: More than three times a week    Frequency of Social Gatherings with Friends and Family: More than three times a week    Attends Religious Services: 1 to 4 times per year    Active Member of [employer] or Organizations: No    Attends Engineer, structural: Not on file    Marital Status: Never married  Intimate Partner Violence: Not At Risk (02/03/2023)   Received from [facility], Afraid, Rape, and Kick questionnaire    Fear of Current or Ex-Partner: No    Emotionally Abused: No    Physically Abused: No    Sexually Abused: No     Physical Exam   Vitals:   06/06/23 0126  BP: (!) 157/84  Pulse: 90  Resp: 20  Temp: 98.4 F (36.9 C)  SpO2: 100%    CONSTITUTIONAL: Well-appearing, NAD NEURO/PSYCH:  Alert and oriented x 3, no focal deficits EYES:  eyes equal and reactive ENT/NECK:  no LAD, no JVD CARDIO: Regular rate, well-perfused, normal S1 and S2 PULM:  CTAB no wheezing or rhonchi GI/GU:  non-distended, non-tender MSK/SPINE:  No gross deformities, no edema SKIN:  no rash, atraumatic   *Additional and/or pertinent findings included in MDM below  Diagnostic and Interventional Summary    EKG Interpretation Date/Time:    Ventricular Rate:    PR Interval:    QRS Duration:    QT Interval:    QTC Calculation:   R Axis:      Text Interpretation:         Labs Reviewed  URINALYSIS, ROUTINE W REFLEX MICROSCOPIC - Abnormal; Notable for the following components:      Result Value   Hgb urine dipstick SMALL (*)    Leukocytes,Ua TRACE (*)    Bacteria, UA RARE (*)    Crystals PRESENT (*)    All other components within normal limits  PREGNANCY, URINE    No orders to display    Medications - No data to display   Procedures  /  Critical Care Procedures  ED Course and Medical Decision Making  Initial Impression and  Ddx Well-appearing in no acute distress with normal vital signs.  Abdomen is completely soft and nontender with no rebound guarding or rigidity.  Favoring MSK or hip related pain, could also be ovarian cyst, UTI, overall doubt diverticulitis, doubt significant pathology within the abdomen given the reassuring exam.  We discussed the pros and cons of diagnostics, felt to  be very low yield especially given the brief amount of time the patient has been experiencing the pain.  Past medical/surgical history that increases complexity of ED encounter: None, 4 months postpartum  Interpretation of Diagnostics I personally reviewed the urinalysis and my interpretation is as follows: Unremarkable    Patient Reassessment and Ultimate Disposition/Management     Discharge  Patient management required discussion with the following services or consulting groups:  None  Complexity of Problems Addressed Acute illness or injury that poses threat of life of bodily function  Additional Data Reviewed and Analyzed Further history obtained from: None  Additional Factors Impacting ED Encounter Risk None  Merrick Abe. Harless Lien, MD [facility] Emergency Medicine [facility] [email or web address]  Final Clinical Impressions(s) / ED Diagnoses     ICD-10-CM   1. Left lower quadrant pain  R10.32       ED Discharge Orders     None        Discharge Instructions Discussed with and Provided to Patient:     Discharge Instructions      You were evaluated in the Emergency Department and after careful evaluation, we did not find any emergent condition requiring admission or further testing in the hospital.  Your exam/testing today is overall reassuring.  Urine sample did not show any signs of infection.  Recommend use of Tylenol  and Motrin  for discomfort, keep a close eye in your symptoms, follow-up with your primary care if it does not go away.  Please return to the Emergency Department if  you experience any worsening of your condition.   Thank you for allowing us  to be a part of your care.      Edson Graces, MD 06/06/23 [phone]

## 2023-06-17 ENCOUNTER — Emergency Department (HOSPITAL_BASED_OUTPATIENT_CLINIC_OR_DEPARTMENT_OTHER)
Admission: EM | Admit: 2023-06-17 | Discharge: 2023-06-17 | Discharge status: Against Medical Advice | Attending: Emergency Medicine | Admitting: Emergency Medicine

## 2023-06-17 ENCOUNTER — Other Ambulatory Visit: Payer: Self-pay

## 2023-06-17 ENCOUNTER — Ambulatory Visit (HOSPITAL_BASED_OUTPATIENT_CLINIC_OR_DEPARTMENT_OTHER): Admission: RE | Admit: 2023-06-17 | Source: Ambulatory Visit | Admitting: Radiology

## 2023-06-17 DIAGNOSIS — R0602 Shortness of breath: Secondary | ICD-10-CM | POA: Insufficient documentation

## 2023-06-17 DIAGNOSIS — Z5321 Procedure and treatment not carried out due to patient leaving prior to being seen by health care provider: Secondary | ICD-10-CM | POA: Insufficient documentation

## 2023-06-17 NOTE — ED Triage Notes (Signed)
 C/o SHOB x 3 days. Denies cough and congestion. No respiratory hx. NAD.

## 2023-06-18 ENCOUNTER — Encounter (HOSPITAL_BASED_OUTPATIENT_CLINIC_OR_DEPARTMENT_OTHER): Payer: Self-pay

## 2023-06-18 ENCOUNTER — Emergency Department (HOSPITAL_BASED_OUTPATIENT_CLINIC_OR_DEPARTMENT_OTHER): Admitting: Radiology

## 2023-06-18 ENCOUNTER — Emergency Department (HOSPITAL_BASED_OUTPATIENT_CLINIC_OR_DEPARTMENT_OTHER)
Admission: EM | Admit: 2023-06-18 | Discharge: 2023-06-18 | Disposition: A | Discharge status: Home / Self Care | Attending: Emergency Medicine | Admitting: Emergency Medicine

## 2023-06-18 ENCOUNTER — Other Ambulatory Visit: Payer: Self-pay

## 2023-06-18 DIAGNOSIS — R0602 Shortness of breath: Secondary | ICD-10-CM | POA: Insufficient documentation

## 2023-06-18 DIAGNOSIS — R0789 Other chest pain: Secondary | ICD-10-CM | POA: Insufficient documentation

## 2023-06-18 DIAGNOSIS — R079 Chest pain, unspecified: Secondary | ICD-10-CM | POA: Diagnosis present

## 2023-06-18 LAB — BASIC METABOLIC PANEL WITH GFR
Anion gap: 15 (ref 5–15)
BUN: 11 mg/dL (ref 6–20)
CO2: 23 mmol/L (ref 22–32)
Calcium: 10 mg/dL (ref 8.9–10.3)
Chloride: 102 mmol/L (ref 98–111)
Creatinine, Ser: 0.84 mg/dL (ref 0.44–1.00)
GFR, Estimated: >60 mL/min (ref >60)
Glucose, Bld: 98 mg/dL (ref 70–99)
Potassium: 3.8 mmol/L (ref 3.5–5.1)
Sodium: 140 mmol/L (ref 135–145)

## 2023-06-18 LAB — CBC
HCT: 35.1 % — ABNORMAL LOW (ref 36.0–46.0)
Hemoglobin: 11.2 g/dL — ABNORMAL LOW (ref 12.0–15.0)
MCH: 25.9 pg — ABNORMAL LOW (ref 26.0–34.0)
MCHC: 31.9 g/dL (ref 30.0–36.0)
MCV: 81.1 fL (ref 80.0–100.0)
Platelets: 298 10*3/uL (ref 150–400)
RBC: 4.33 MIL/uL (ref 3.87–5.11)
RDW: 14.6 % (ref 11.5–15.5)
WBC: 7.1 10*3/uL (ref 4.0–10.5)
nRBC: 0 % (ref 0.0–0.2)

## 2023-06-18 LAB — TROPONIN T, HIGH SENSITIVITY: Troponin T High Sensitivity: <15 ng/L (ref <19)

## 2023-06-18 MED ORDER — KETOROLAC TROMETHAMINE 30 MG/ML IJ SOLN
30.0000 mg | Freq: Once | INTRAMUSCULAR | Status: AC
Start: 2023-06-18 — End: 2023-06-18
  Administered 2023-06-18: 30 mg via INTRAVENOUS
  Filled 2023-06-18: qty 1

## 2023-06-18 NOTE — Discharge Instructions (Signed)
 Take ibuprofen  600 mg every 6 hours as needed for pain.  Rest.  Return to the emergency department if you develop worsening pain, high fevers, worsening breathing, or for other new and concerning symptoms.

## 2023-06-18 NOTE — ED Triage Notes (Signed)
 Pt presents via POV c/o left sided chest pain with SOB. Reports here earlier however left after triage. Reports hx of chronic chest pain. A&O x4. Ambulatory to room.

## 2023-06-18 NOTE — ED Provider Notes (Signed)
 [place] EMERGENCY DEPARTMENT AT [facility] Provider Note   CSN: [id number] Arrival date & time: 06/18/23  0343     History  Chief Complaint  Patient presents with   Chest Pain    Diane Lloyd is a 33 y.o. female.  Patient is a 33 year old female with past medical history of GERD, tachycardia.  Patient presenting today with complaints of chest discomfort.  She describes feeling somewhat short of breath for the past few days, then yesterday started with discomfort to the left side of her chest.  This is a sharp pain that comes and goes and not associated with any nausea, diaphoresis, or radiation to the arm or jaw.  She denies any recent exertional symptoms.  Patient has no prior cardiac history, but did have a normal stress test and calcium  scoring of 0 four years ago.       Home Medications Prior to Admission medications   Medication Sig Start Date End Date Taking? Authorizing Provider  cyclobenzaprine  (FLEXERIL ) 5 MG tablet Take 1 tablet (5 mg total) by mouth 3 (three) times daily as needed (headache). 01/14/23   Terri Fester, NP  famotidine  (PEPCID ) 20 MG tablet Take 20 mg by mouth 2 (two) times daily.    [provider]  ibuprofen  (ADVIL ) 600 MG tablet Take 1 tablet (600 mg total) by mouth every 6 (six) hours. 01/12/23   Lacey Pian, MD  labetalol  (NORMODYNE ) 200 MG tablet Take 400 mg by mouth 2 (two) times daily.    [provider]  NIFEdipine  (PROCARDIA -XL/NIFEDICAL-XL) 30 MG 24 hr tablet Take 30 mg by mouth 2 (two) times daily.    [provider]  sertraline  (ZOLOFT ) 100 MG tablet Take 100 mg by mouth daily.    [provider]  Ubrogepant  (UBRELVY ) 50 MG TABS Take one tablet when you feel a headache, take 2nd tablet if still present in 2 hours. No more than 2 doses in 24 hours. 03/07/23   Wellington Half, FNP  valACYclovir  (VALTREX ) 500 MG tablet Take 1 tablet (500 mg total) by mouth daily. 03/18/23   Wellington Half,  FNP      Allergies    Diflucan  [fluconazole ]    Review of Systems   Review of Systems  All other systems reviewed and are negative.   Physical Exam Updated Vital Signs BP (!) 137/96   Pulse 84   Temp 97.9 F (36.6 C) (Oral)   Resp 16   LMP 04/14/2023 (Approximate)   SpO2 100%   Breastfeeding Yes  Physical Exam Vitals and nursing note reviewed.  Constitutional:      General: She is not in acute distress.    Appearance: She is well-developed. She is not diaphoretic.  HENT:     Head: Normocephalic and atraumatic.  Cardiovascular:     Rate and Rhythm: Normal rate and regular rhythm.     Heart sounds: No murmur heard.    No friction rub. No gallop.  Pulmonary:     Effort: Pulmonary effort is normal. No respiratory distress.     Breath sounds: Normal breath sounds. No wheezing.  Abdominal:     General: Bowel sounds are normal. There is no distension.     Palpations: Abdomen is soft.     Tenderness: There is no abdominal tenderness.  Musculoskeletal:        General: Normal range of motion.     Cervical back: Normal range of motion and neck supple.  Skin:    General: Skin  is warm and dry.  Neurological:     General: No focal deficit present.     Mental Status: She is alert and oriented to person, place, and time.     ED Results / Procedures / Treatments   Labs (all labs ordered are listed, but only abnormal results are displayed) Labs Reviewed  CBC - Abnormal; Notable for the following components:      Result Value   Hemoglobin 11.2 (*)    HCT 35.1 (*)    MCH 25.9 (*)    All other components within normal limits  BASIC METABOLIC PANEL WITH GFR  PREGNANCY, URINE  TROPONIN T, HIGH SENSITIVITY    EKG EKG Interpretation Date/Time:  Saturday June 18 2023 03:52:42 EDT Ventricular Rate:  81 PR Interval:  140 QRS Duration:  101 QT Interval:  382 QTC Calculation: 444 R Axis:   49  Text Interpretation: Sinus rhythm Normal ECG Confirmed by Orvilla Blander ([id number])  on 06/18/2023 3:54:49 AM  Radiology No results found.  Procedures Procedures    Medications Ordered in ED Medications  ketorolac  (TORADOL ) 30 MG/ML injection 30 mg (has no administration in time range)    ED Course/ Medical Decision Making/ A&P  Patient is a 33 year old female presenting with complaints of left anterior chest pain for the past several days.  She arrives here with stable vital signs and is afebrile.  Oxygen saturations are 100%.  Physical examination is unremarkable.  Laboratory studies obtained including CBC, basic metabolic panel, and troponin, all of which are normal.  Chest x-ray is clear.  Patient given IV Toradol  with some relief.  At this point, I highly doubt a cardiac etiology.  Her workup is unremarkable and she has no real risk factors.  She also had a calcium  scoring test 4 years ago with results of 0.  I also highly doubt pulmonary embolism given oxygen saturations of 100% and heart rate in the 60s.  Presentation not suggestive of aortic dissection and x-ray showing no evidence for pneumothorax.  Patient to be discharged with NSAIDs, rest, and follow-up as needed.  Final Clinical Impression(s) / ED Diagnoses Final diagnoses:  None    Rx / DC Orders ED Discharge Orders     None         Orvilla Blander, MD 06/18/23 [phone]

## 2023-06-22 ENCOUNTER — Emergency Department (HOSPITAL_BASED_OUTPATIENT_CLINIC_OR_DEPARTMENT_OTHER)

## 2023-06-22 ENCOUNTER — Other Ambulatory Visit: Payer: Self-pay

## 2023-06-22 ENCOUNTER — Emergency Department (HOSPITAL_BASED_OUTPATIENT_CLINIC_OR_DEPARTMENT_OTHER)
Admission: EM | Admit: 2023-06-22 | Discharge: 2023-06-22 | Disposition: A | Discharge status: Home / Self Care | Attending: Emergency Medicine | Admitting: Emergency Medicine

## 2023-06-22 ENCOUNTER — Encounter (HOSPITAL_BASED_OUTPATIENT_CLINIC_OR_DEPARTMENT_OTHER): Payer: Self-pay

## 2023-06-22 DIAGNOSIS — R0602 Shortness of breath: Secondary | ICD-10-CM | POA: Diagnosis present

## 2023-06-22 DIAGNOSIS — I10 Essential (primary) hypertension: Secondary | ICD-10-CM | POA: Insufficient documentation

## 2023-06-22 DIAGNOSIS — E119 Type 2 diabetes mellitus without complications: Secondary | ICD-10-CM | POA: Insufficient documentation

## 2023-06-22 LAB — CBC
HCT: 34.8 % — ABNORMAL LOW (ref 36.0–46.0)
Hemoglobin: 11.1 g/dL — ABNORMAL LOW (ref 12.0–15.0)
MCH: 25.9 pg — ABNORMAL LOW (ref 26.0–34.0)
MCHC: 31.9 g/dL (ref 30.0–36.0)
MCV: 81.1 fL (ref 80.0–100.0)
Platelets: 291 10*3/uL (ref 150–400)
RBC: 4.29 MIL/uL (ref 3.87–5.11)
RDW: 14.7 % (ref 11.5–15.5)
WBC: 5.1 10*3/uL (ref 4.0–10.5)
nRBC: 0 % (ref 0.0–0.2)

## 2023-06-22 LAB — D-DIMER, QUANTITATIVE: D-Dimer, Quant: <0.27 ug{FEU}/mL (ref 0.00–0.50)

## 2023-06-22 LAB — COMPREHENSIVE METABOLIC PANEL WITH GFR
ALT: 22 U/L (ref 0–44)
AST: 17 U/L (ref 15–41)
Albumin: 4.4 g/dL (ref 3.5–5.0)
Alkaline Phosphatase: 63 U/L (ref 38–126)
Anion gap: 10 (ref 5–15)
BUN: 10 mg/dL (ref 6–20)
CO2: 26 mmol/L (ref 22–32)
Calcium: 9.3 mg/dL (ref 8.9–10.3)
Chloride: 105 mmol/L (ref 98–111)
Creatinine, Ser: 0.91 mg/dL (ref 0.44–1.00)
GFR, Estimated: >60 mL/min (ref >60)
Glucose, Bld: 104 mg/dL — ABNORMAL HIGH (ref 70–99)
Potassium: 3.9 mmol/L (ref 3.5–5.1)
Sodium: 141 mmol/L (ref 135–145)
Total Bilirubin: 0.3 mg/dL (ref 0.0–1.2)
Total Protein: 7.3 g/dL (ref 6.5–8.1)

## 2023-06-22 LAB — TSH: TSH: 1.308 u[IU]/mL (ref 0.350–4.500)

## 2023-06-22 LAB — SARS CORONAVIRUS 2 BY RT PCR: SARS Coronavirus 2 by RT PCR: NEGATIVE

## 2023-06-22 LAB — TROPONIN T, HIGH SENSITIVITY: Troponin T High Sensitivity: <15 ng/L (ref <19)

## 2023-06-22 LAB — PREGNANCY, URINE: Preg Test, Ur: NEGATIVE

## 2023-06-22 NOTE — ED Triage Notes (Addendum)
 Pt states that she has been feeling winded for the past week. Has been seen in ER and UC, without diagnosis. Pt says that she just feels like something is not okay. Pt states she feels winded even just holding conversation. Pt also c/o palpitations. No recent travel, no hormonal bc, no hx blood clots.   Anastacio Balm, RN

## 2023-06-22 NOTE — ED Provider Notes (Signed)
 [place] EMERGENCY DEPARTMENT AT [facility] Provider Note   CSN: [id number] Arrival date & time: 06/22/23  2952     History  Chief Complaint  Patient presents with   Shortness of Breath    Diane Lloyd is a 33 y.o. female with history of type 2 diabetes, hypertension, obesity, presents with concern for feeling winded for the past week.  States this sensation feels constant and she will feel this even when talking.  Denies any worsening with exertion or food intake.  Denies any cough, fever, chills.  Denies any lower extremity pain or swelling, any OCP use, any recent long plane or car rides, recent hospitalizations or surgeries.  She states she has chronic chest pains have been ongoing for years and she does see a cardiologist for this.  Her chest pains occur in the upper left side of the chest.  These pains are not associated with any exertion, do not radiate to the back.  No change in chest pain, states this is consistent with her chronic chest pains.   Shortness of Breath      Home Medications Prior to Admission medications   Medication Sig Start Date End Date Taking? Authorizing Provider  cyclobenzaprine  (FLEXERIL ) 5 MG tablet Take 1 tablet (5 mg total) by mouth 3 (three) times daily as needed (headache). 01/14/23   Terri Fester, NP  famotidine  (PEPCID ) 20 MG tablet Take 20 mg by mouth 2 (two) times daily.    [provider]  ibuprofen  (ADVIL ) 600 MG tablet Take 1 tablet (600 mg total) by mouth every 6 (six) hours. 01/12/23   Lacey Pian, MD  labetalol  (NORMODYNE ) 200 MG tablet Take 400 mg by mouth 2 (two) times daily.    [provider]  NIFEdipine  (PROCARDIA -XL/NIFEDICAL-XL) 30 MG 24 hr tablet Take 30 mg by mouth 2 (two) times daily.    [provider]  sertraline  (ZOLOFT ) 100 MG tablet Take 100 mg by mouth daily.    [provider]  Ubrogepant  (UBRELVY ) 50 MG TABS Take one tablet when you feel a headache, take 2nd tablet  if still present in 2 hours. No more than 2 doses in 24 hours. 03/07/23   Wellington Half, FNP  valACYclovir  (VALTREX ) 500 MG tablet Take 1 tablet (500 mg total) by mouth daily. 03/18/23   Wellington Half, FNP      Allergies    Diflucan  [fluconazole ]    Review of Systems   Review of Systems  Respiratory:  Positive for shortness of breath.     Physical Exam Updated Vital Signs BP (!) 148/91 (BP Location: Right Arm)   Pulse 72   Temp 98.1 F (36.7 C) (Oral)   Resp 16   Ht 5' 10 (1.778 m)   Wt 120.7 kg   LMP 04/14/2023 (Approximate) Comment: negative urine preg test 1-1.5 weeks ago  SpO2 100%   BMI 38.17 kg/m  Physical Exam Vitals and nursing note reviewed.  Constitutional:      General: She is not in acute distress.    Appearance: She is well-developed. She is obese.  HENT:     Head: Normocephalic and atraumatic.  Eyes:     Conjunctiva/sclera: Conjunctivae normal.  Cardiovascular:     Rate and Rhythm: Normal rate and regular rhythm.     Heart sounds: No murmur heard.    Comments: 2+ radial and pedal pulses bilaterally Pulmonary:     Effort: Pulmonary effort is normal. No respiratory distress.  Breath sounds: Normal breath sounds.     Comments: Slightly diminished bilaterally due to body habitus  Talking in full sentences on room air without difficulty Abdominal:     Palpations: Abdomen is soft.     Tenderness: There is no abdominal tenderness.  Musculoskeletal:        General: No swelling.     Cervical back: Neck supple.     Right lower leg: No edema.     Left lower leg: No edema.     Comments: No calf tenderness to palpation bilaterally  Skin:    General: Skin is warm and dry.     Capillary Refill: Capillary refill takes less than 2 seconds.  Neurological:     Mental Status: She is alert.  Psychiatric:        Mood and Affect: Mood normal.     ED Results / Procedures / Treatments   Labs (all labs ordered are listed, but only abnormal results  are displayed) Labs Reviewed  CBC - Abnormal; Notable for the following components:      Result Value   Hemoglobin 11.1 (*)    HCT 34.8 (*)    MCH 25.9 (*)    All other components within normal limits  COMPREHENSIVE METABOLIC PANEL WITH GFR - Abnormal; Notable for the following components:   Glucose, Bld 104 (*)    All other components within normal limits  SARS CORONAVIRUS 2 BY RT PCR  PREGNANCY, URINE  D-DIMER, QUANTITATIVE  TSH  TROPONIN T, HIGH SENSITIVITY    EKG None  Radiology DG Chest 2 View Result Date: 06/22/2023 CLINICAL DATA:  Shortness of breath EXAM: CHEST - 2 VIEW COMPARISON:  June 18, 2023 FINDINGS: The heart size and mediastinal contours are within normal limits. Both lungs are clear. The visualized skeletal structures are unremarkable. IMPRESSION: No active cardiopulmonary disease. Electronically Signed   By: Fredrich Jefferson M.D.   On: 06/22/2023 09:45    Procedures Procedures    Medications Ordered in ED Medications - No data to display  ED Course/ Medical Decision Making/ A&P                                 Medical Decision Making Amount and/or Complexity of Data Reviewed Labs: ordered. Radiology: ordered.     Differential diagnosis includes but is not limited to ACS, arrhythmia, pericarditis, myocarditis, pericardial effusion, cardiac tamponade, GERD, DVT/PE, pneumonia, pleural effusion, thyroid  abnormality, electrolyte abnormality, anemia   ED Course:  Upon initial evaluation, patient is well-appearing, stable vitals aside from mildly elevated blood pressure 148/91.  She is reporting feeling winded, but is talking in full sentences on room air without difficulty.  She is remaining at 100% O2 saturation on room air.  Lungs sound clear to auscultation bilaterally, but sounds are slightly diminished due to body habitus.   Labs Ordered: I Ordered, and personally interpreted labs.  The pertinent results include:   CBC with hemoglobin at 11.1, but  appears to be at baseline CMP unremarkable D-dimer within normal limits Troponin under 15 COVID-negative Pregnancy negative TSH in process  Imaging Studies ordered: I ordered imaging studies including chest x-ray I independently visualized the imaging with scope of interpretation limited to determining acute life threatening conditions related to emergency care. Imaging showed no acute abnormalities I agree with the radiologist interpretation   Cardiac Monitoring: / EKG: The patient was maintained on a cardiac monitor.  I personally viewed and  interpreted the cardiac monitored which showed an underlying rhythm of: Normal sinus rhythm, no ST changes  Medications Given: None  Upon re-evaluation, patient remains well-appearing with stable vitals.   Low concern for ACS at this time given troponin remains stable with initial troponin of less than 15, symptoms have been ongoing for week, and EKG with normal sinus rhythm and no ST changes. Chest x-ray without any acute abnormality. No concern for DVT or PE at this time given PERC negative and d-dimer within normal limits.  COVID-negative.  Denying any other URI symptoms to suggest viral URI etiology.  Hemoglobin was low at 11.1, but did appear to be consistent with prior draws.  Do not feel this is the cause of her symptoms at this time.  However, we discussed following up with her PCP within the next week to discuss this low hemoglobin and to follow-up on the pending TSH result.  She is stable and appropriate for discharge home this time.    Impression: Shortness of breath  Disposition:  The patient was discharged home with instructions to follow-up with PCP within the next week for re-evaluation and to follow up on TSH result, her low hemoglobin, and elevated blood pressure today. Return precautions given.    Record Review: External records from outside source obtained and reviewed including previous CBC from the past couple months where  hemoglobin has been in the 11's. Patient cardiology note reviewed where they feel patient's chronic chest pain is musculoskeletal in origin.     This chart was dictated using voice recognition software, Dragon. Despite the best efforts of this provider to proofread and correct errors, errors may still occur which can change documentation meaning.          Final Clinical Impression(s) / ED Diagnoses Final diagnoses:  Shortness of breath    Rx / DC Orders ED Discharge Orders     None         Rexie Catena, Kirby Peoples 06/22/23 1157    Mordecai Applebaum, MD 06/22/23 1429

## 2023-06-22 NOTE — Discharge Instructions (Addendum)
 It is unclear what is causing your shortness of breath.  However, your workup here is reassuring.  Your cardiac enzyme (troponin) was normal today. Your EKG which is a measure of the heart's electrical activity and rhythm is normal today.   Your chest x-ray is normal today.  Your labs to look for a blood clot (D-dimer) was normal (no blood clot).  Your hemoglobin was slightly low today at 11.1.  This does seem consistent with previous lab draws.  However, please follow-up with your hematologist regarding this within the next month for further management.  A low hemoglobin can cause you to feel short of breath.  Your electrolytes, kidney, and liver function are normal today.  Your COVID test was negative  Your pregnancy was negative  Please follow-up with your PCP within the next week for recheck of symptoms and to follow-up on your thyroid  test sent out today (TSH). Your blood pressure was elevated here today at 148/91. Aim to get 30 minutes of exercise daily and limit intake of processed/fast foods. Please take and record your blood pressures at home. Take them at the same time every day.  At your PCP follow-up, please discuss if you may need treatment for your blood pressure.   Return to the ER if you have any shortness of breath, difficulty breathing, worsening chest pain, dizziness, jaw pain, left arm or shoulder pain, abdominal pain, unexplained fever, any other new or concerning symptoms.

## 2023-06-30 ENCOUNTER — Other Ambulatory Visit: Payer: Self-pay

## 2023-06-30 ENCOUNTER — Emergency Department (HOSPITAL_BASED_OUTPATIENT_CLINIC_OR_DEPARTMENT_OTHER): Admitting: Radiology

## 2023-06-30 ENCOUNTER — Emergency Department (HOSPITAL_BASED_OUTPATIENT_CLINIC_OR_DEPARTMENT_OTHER)
Admission: EM | Admit: 2023-06-30 | Discharge: 2023-06-30 | Disposition: A | Discharge status: Home / Self Care | Attending: Emergency Medicine | Admitting: Emergency Medicine

## 2023-06-30 ENCOUNTER — Encounter (HOSPITAL_BASED_OUTPATIENT_CLINIC_OR_DEPARTMENT_OTHER): Payer: Self-pay

## 2023-06-30 DIAGNOSIS — M546 Pain in thoracic spine: Secondary | ICD-10-CM | POA: Diagnosis not present

## 2023-06-30 DIAGNOSIS — R079 Chest pain, unspecified: Secondary | ICD-10-CM | POA: Diagnosis present

## 2023-06-30 DIAGNOSIS — M549 Dorsalgia, unspecified: Secondary | ICD-10-CM

## 2023-06-30 DIAGNOSIS — R0789 Other chest pain: Secondary | ICD-10-CM

## 2023-06-30 DIAGNOSIS — R0602 Shortness of breath: Secondary | ICD-10-CM | POA: Insufficient documentation

## 2023-06-30 LAB — CBC
HCT: 37 % (ref 36.0–46.0)
Hemoglobin: 12.3 g/dL (ref 12.0–15.0)
MCH: 26.9 pg (ref 26.0–34.0)
MCHC: 33.2 g/dL (ref 30.0–36.0)
MCV: 81 fL (ref 80.0–100.0)
Platelets: 332 10*3/uL (ref 150–400)
RBC: 4.57 MIL/uL (ref 3.87–5.11)
RDW: 14.8 % (ref 11.5–15.5)
WBC: 8.4 10*3/uL (ref 4.0–10.5)
nRBC: 0 % (ref 0.0–0.2)

## 2023-06-30 LAB — TROPONIN T, HIGH SENSITIVITY: Troponin T High Sensitivity: <15 ng/L (ref <19)

## 2023-06-30 LAB — PREGNANCY, URINE: Preg Test, Ur: NEGATIVE

## 2023-06-30 LAB — BASIC METABOLIC PANEL WITH GFR
Anion gap: 17 — ABNORMAL HIGH (ref 5–15)
BUN: 15 mg/dL (ref 6–20)
CO2: 21 mmol/L — ABNORMAL LOW (ref 22–32)
Calcium: 10.2 mg/dL (ref 8.9–10.3)
Chloride: 100 mmol/L (ref 98–111)
Creatinine, Ser: 0.86 mg/dL (ref 0.44–1.00)
GFR, Estimated: >60 mL/min (ref >60)
Glucose, Bld: 107 mg/dL — ABNORMAL HIGH (ref 70–99)
Potassium: 3.7 mmol/L (ref 3.5–5.1)
Sodium: 138 mmol/L (ref 135–145)

## 2023-06-30 MED ORDER — IBUPROFEN 800 MG PO TABS
800.0000 mg | ORAL_TABLET | Freq: Once | ORAL | Status: AC
Start: 2023-06-30 — End: 2023-06-30
  Administered 2023-06-30: 800 mg via ORAL
  Filled 2023-06-30: qty 1

## 2023-06-30 NOTE — Discharge Instructions (Addendum)
Get help right away if:You have nausea or vomiting.You feel sweaty or light-headed.You have a cough with mucus from your lungs (sputum) or you cough up blood.You develop shortness of breath.These symptoms may represent a serious problem that is an emergency. Do not wait to see if the symptoms will go away. Get medical help right away. Call your local emergency services (911 in the [place] Do not drive yourself to the hospital.

## 2023-06-30 NOTE — ED Provider Notes (Signed)
 [place] EMERGENCY DEPARTMENT AT [facility] Provider Note   CSN: [id number] Arrival date & time: 06/30/23  2218     Patient presents with: Chest Pain   Diane Lloyd is a 33 y.o. female.  {Add pertinent medical, surgical, social history, OB history to HPI:32947}  Chest Pain      Prior to Admission medications   Medication Sig Start Date End Date Taking? Authorizing Provider  cyclobenzaprine  (FLEXERIL ) 5 MG tablet Take 1 tablet (5 mg total) by mouth 3 (three) times daily as needed (headache). 01/14/23   Terri Fester, NP  famotidine  (PEPCID ) 20 MG tablet Take 20 mg by mouth 2 (two) times daily.    [provider]  ibuprofen  (ADVIL ) 600 MG tablet Take 1 tablet (600 mg total) by mouth every 6 (six) hours. 01/12/23   Lacey Pian, MD  labetalol  (NORMODYNE ) 200 MG tablet Take 400 mg by mouth 2 (two) times daily.    [provider]  NIFEdipine  (PROCARDIA -XL/NIFEDICAL-XL) 30 MG 24 hr tablet Take 30 mg by mouth 2 (two) times daily.    [provider]  sertraline  (ZOLOFT ) 100 MG tablet Take 100 mg by mouth daily.    [provider]  Ubrogepant  (UBRELVY ) 50 MG TABS Take one tablet when you feel a headache, take 2nd tablet if still present in 2 hours. No more than 2 doses in 24 hours. 03/07/23   Wellington Half, FNP  valACYclovir  (VALTREX ) 500 MG tablet Take 1 tablet (500 mg total) by mouth daily. 03/18/23   Wellington Half, FNP    Allergies: Diflucan  [fluconazole ]    Review of Systems  Cardiovascular:  Positive for chest pain.    Updated Vital Signs BP (!) 144/86 (BP Location: Right Arm)   Pulse 99   Temp 98.4 F (36.9 C) (Oral)   Resp 20   Ht 5' 10 (1.778 m)   Wt 120 kg   LMP 04/14/2023 (Approximate)   SpO2 99%   Breastfeeding Yes   BMI 37.96 kg/m   Physical Exam  (all labs ordered are listed, but only abnormal results are displayed) Labs Reviewed  BASIC METABOLIC PANEL WITH GFR - Abnormal; Notable for the  following components:      Result Value   CO2 21 (*)    Glucose, Bld 107 (*)    Anion gap 17 (*)    All other components within normal limits  CBC  PREGNANCY, URINE  TROPONIN T, HIGH SENSITIVITY    EKG: None  Radiology: No results found.  {Document cardiac monitor, telemetry assessment procedure when appropriate:32947} Procedures   Medications Ordered in the ED  ibuprofen  (ADVIL ) tablet 800 mg (has no administration in time range)      {Click here for ABCD2, HEART and other calculators REFRESH Note before signing:1}                              Medical Decision Making Amount and/or Complexity of Data Reviewed Labs: ordered.  Risk Prescription drug management.   ***  {Document critical care time when appropriate  Document review of labs and clinical decision tools ie CHADS2VASC2, etc  Document your independent review of radiology images and any outside records  Document your discussion with family members, caretakers and with consultants  Document social determinants of health affecting pt's care  Document your decision making why or why not admission, treatments were needed:32947:::1}   Final diagnoses:  Chest wall pain  Upper  back pain    ED Discharge Orders     None

## 2023-06-30 NOTE — ED Triage Notes (Signed)
 Pt states she was seen here last week for CP Saw cardiologist last Wed, per pt they don not feel this is cardiac related. Pt reports left sided CP that radiates to left arm and left side of neck Denies N/V or [facility]

## 2023-07-03 ENCOUNTER — Emergency Department (HOSPITAL_COMMUNITY)
Admission: EM | Admit: 2023-07-03 | Discharge: 2023-07-03 | Disposition: A | Discharge status: Home / Self Care | Attending: Emergency Medicine | Admitting: Emergency Medicine

## 2023-07-03 ENCOUNTER — Encounter (HOSPITAL_COMMUNITY): Payer: Self-pay

## 2023-07-03 ENCOUNTER — Other Ambulatory Visit: Payer: Self-pay

## 2023-07-03 ENCOUNTER — Emergency Department (HOSPITAL_COMMUNITY)

## 2023-07-03 DIAGNOSIS — E119 Type 2 diabetes mellitus without complications: Secondary | ICD-10-CM | POA: Diagnosis not present

## 2023-07-03 DIAGNOSIS — J45909 Unspecified asthma, uncomplicated: Secondary | ICD-10-CM | POA: Insufficient documentation

## 2023-07-03 DIAGNOSIS — Z79899 Other long term (current) drug therapy: Secondary | ICD-10-CM | POA: Insufficient documentation

## 2023-07-03 DIAGNOSIS — R519 Headache, unspecified: Secondary | ICD-10-CM | POA: Insufficient documentation

## 2023-07-03 DIAGNOSIS — I10 Essential (primary) hypertension: Secondary | ICD-10-CM | POA: Insufficient documentation

## 2023-07-03 MED ORDER — KETOROLAC TROMETHAMINE 30 MG/ML IJ SOLN
30.0000 mg | Freq: Once | INTRAMUSCULAR | Status: AC
  Administered 2023-07-03: 30 mg via INTRAMUSCULAR
  Filled 2023-07-03: qty 1

## 2023-07-03 NOTE — Discharge Instructions (Signed)
 The Toradol  medication can potentially cause problems while breast-feeding.  The risk is low but to be safe pump and discard for the next 12 hours.  You can continue to take over-the-counter medications as needed for headache.  Follow-up with your doctor to be rechecked.

## 2023-07-03 NOTE — ED Notes (Signed)
 Back from CT

## 2023-07-03 NOTE — ED Triage Notes (Signed)
 Pt is coming in with complaints of right temple pain that started around 2 days ago, she also mentions she has had some eye pain that accompanied the pain. She describes the pain as a pressure more so than a sharp pain. No other neurological symptoms of deficits, and no weakness or numbness. She does take blood pressure medication and her bp was slightly e;elevated in triage. Pt has taken tylenol  and meloxicam today prior to coming in.

## 2023-07-03 NOTE — ED Provider Notes (Signed)
 [place] EMERGENCY DEPARTMENT AT [facility] Provider Note   CSN: [id number] Arrival date & time: 07/03/23  2039     Patient presents with: Headache   Diane Lloyd is a 33 y.o. female.  {Add pertinent medical, surgical, social history, OB history to HPI:32947}  Headache    Patient has a history of asthma, hypertension, anxiety, diabetes, migraine headaches.  Patient states she started having pain on the right side of her temple for the last couple days.  She has noticed some headache behind her eye with this.  She says it is more of a pressure type discomfort than sharp pain.  Patient's not having any vision changes.  No numbness or weakness.  Patient tried taking Tylenol  and meloxicam today.  Patient states usually her migraine headaches are more diffuse.  Prior to Admission medications   Medication Sig Start Date End Date Taking? Authorizing Provider  cyclobenzaprine  (FLEXERIL ) 5 MG tablet Take 1 tablet (5 mg total) by mouth 3 (three) times daily as needed (headache). 01/14/23   Jerilynn Longs, NP  famotidine  (PEPCID ) 20 MG tablet Take 20 mg by mouth 2 (two) times daily.    [provider]  ibuprofen  (ADVIL ) 600 MG tablet Take 1 tablet (600 mg total) by mouth every 6 (six) hours. 01/12/23   Cleatus Moccasin, MD  labetalol  (NORMODYNE ) 200 MG tablet Take 400 mg by mouth 2 (two) times daily.    [provider]  NIFEdipine  (PROCARDIA -XL/NIFEDICAL-XL) 30 MG 24 hr tablet Take 30 mg by mouth 2 (two) times daily.    [provider]  sertraline  (ZOLOFT ) 100 MG tablet Take 100 mg by mouth daily.    [provider]  Ubrogepant  (UBRELVY ) 50 MG TABS Take one tablet when you feel a headache, take 2nd tablet if still present in 2 hours. No more than 2 doses in 24 hours. 03/07/23   Alvia Corean CROME, FNP  valACYclovir  (VALTREX ) 500 MG tablet Take 1 tablet (500 mg total) by mouth daily. 03/18/23   Alvia Corean CROME, FNP    Allergies: Diflucan   [fluconazole ]    Review of Systems  Neurological:  Positive for headaches.    Updated Vital Signs BP (!) 144/80 (BP Location: Left Arm)   Pulse 96   Temp 98 F (36.7 C) (Oral)   Resp 17   LMP 04/14/2023 (Approximate)   SpO2 100%   Physical Exam Vitals and nursing note reviewed.  Constitutional:      General: She is not in acute distress.    Appearance: She is well-developed.  HENT:     Head: Normocephalic and atraumatic.     Comments: Mild tenderness right temple area, no vascular tenderness    Right Ear: External ear normal.     Left Ear: External ear normal.   Eyes:     General: No visual field deficit or scleral icterus.       Right eye: No discharge.        Left eye: No discharge.     Conjunctiva/sclera: Conjunctivae normal.   Neck:     Trachea: No tracheal deviation.   Cardiovascular:     Rate and Rhythm: Normal rate and regular rhythm.  Pulmonary:     Effort: Pulmonary effort is normal. No respiratory distress.     Breath sounds: Normal breath sounds. No stridor. No wheezing or rales.  Abdominal:     General: Bowel sounds are normal. There is no distension.     Palpations: Abdomen is soft.  Tenderness: There is no abdominal tenderness. There is no guarding or rebound.   Musculoskeletal:        General: No tenderness or deformity.     Cervical back: Neck supple.   Skin:    General: Skin is warm and dry.     Findings: No rash.   Neurological:     General: No focal deficit present.     Mental Status: She is alert.     GCS: GCS eye subscore is 4. GCS verbal subscore is 5. GCS motor subscore is 6.     Cranial Nerves: No cranial nerve deficit, dysarthria or facial asymmetry.     Sensory: No sensory deficit.     Motor: No abnormal muscle tone or seizure activity.     Coordination: Coordination normal.     Comments: Normal strength and sensation bilateral upper extremities lower extremities, no visual field defect, no facial droop  Psychiatric:         Mood and Affect: Mood normal.     (all labs ordered are listed, but only abnormal results are displayed) Labs Reviewed - No data to display  EKG: None  Radiology: No results found.  {Document cardiac monitor, telemetry assessment procedure when appropriate:32947} Procedures   Medications Ordered in the ED  ketorolac  (TORADOL ) 30 MG/ML injection 30 mg (has no administration in time range)      {Click here for ABCD2, HEART and other calculators REFRESH Note before signing:1}                              Medical Decision Making Risk Prescription drug management.   Patient's exam is reassuring.  No thunderclap onset to suggest subarachnoid hemorrhage.  Patient is not having any fevers or chills or neck stiffness to suggest meningitis.  Patient does not have any focal neurologic deficits.  Low suspicion for temporal arteritis at her age.  Offered migraine cocktail.  Patient only interested in getting a dose of Toradol .  {Document critical care time when appropriate  Document review of labs and clinical decision tools ie CHADS2VASC2, etc  Document your independent review of radiology images and any outside records  Document your discussion with family members, caretakers and with consultants  Document social determinants of health affecting pt's care  Document your decision making why or why not admission, treatments were needed:32947:::1}   Final diagnoses:  None    ED Discharge Orders     None

## 2023-07-15 ENCOUNTER — Encounter (HOSPITAL_COMMUNITY): Payer: Self-pay

## 2023-07-15 ENCOUNTER — Ambulatory Visit (HOSPITAL_COMMUNITY)
Admission: EM | Admit: 2023-07-15 | Discharge: 2023-07-15 | Disposition: A | Discharge status: Home / Self Care | Attending: Internal Medicine | Admitting: Internal Medicine

## 2023-07-15 ENCOUNTER — Ambulatory Visit (INDEPENDENT_AMBULATORY_CARE_PROVIDER_SITE_OTHER)

## 2023-07-15 DIAGNOSIS — K219 Gastro-esophageal reflux disease without esophagitis: Secondary | ICD-10-CM

## 2023-07-15 DIAGNOSIS — M25522 Pain in left elbow: Secondary | ICD-10-CM

## 2023-07-15 MED ORDER — SUCRALFATE 1 G PO TABS: 1.0000 g | ORAL_TABLET | Freq: Three times a day (TID) | ORAL | 2 refills | Status: DC | PRN

## 2023-07-15 MED ORDER — LIDOCAINE VISCOUS HCL 2 % MT SOLN
OROMUCOSAL | Status: AC
  Filled 2023-07-15: qty 15

## 2023-07-15 MED ORDER — KETOROLAC TROMETHAMINE 30 MG/ML IJ SOLN
15.0000 mg | Freq: Once | INTRAMUSCULAR | Status: AC
  Administered 2023-07-15: 15 mg via INTRAMUSCULAR

## 2023-07-15 MED ORDER — ALUM & MAG HYDROXIDE-SIMETH 200-200-20 MG/5ML PO SUSP
ORAL | Status: AC
  Filled 2023-07-15: qty 30

## 2023-07-15 MED ORDER — ALUM & MAG HYDROXIDE-SIMETH 200-200-20 MG/5ML PO SUSP
30.0000 mL | Freq: Once | ORAL | Status: AC
  Administered 2023-07-15: 30 mL via ORAL

## 2023-07-15 MED ORDER — KETOROLAC TROMETHAMINE 30 MG/ML IJ SOLN
INTRAMUSCULAR | Status: AC
  Filled 2023-07-15: qty 1

## 2023-07-15 MED ORDER — LIDOCAINE VISCOUS HCL 2 % MT SOLN
15.0000 mL | Freq: Once | OROMUCOSAL | Status: AC
  Administered 2023-07-15: 15 mL via OROMUCOSAL

## 2023-07-15 NOTE — Discharge Instructions (Addendum)
 For the left arm pain, there is pinpoint tenderness around the elbow right around a bursa which is a fluid-filled sac around the joints.  This is likely causing the pain.  Elbow x-ray done today.  Final evaluation by the radiologist is still pending but on brief evaluation there does not appear to be any acute process.  This can be treated with anti-inflammatories such as ibuprofen  and Ice the area 2-3 times daily for 10-15 minutes to help with pain and swelling. Do not apply ice directly to the skin.  We have recommended the following: Toradol  injection given today. This is a medication to help with pain. This is not a narcotic.   Voltaren gel 3-4 times daily to the affected area as needed for pain May use ibuprofen  for pain Ice the area 2-3 times daily for 10-15 minutes to help with pain and swelling. Do not apply ice directly to the skin.   If symptoms fail to improve, may need to consider following up with orthopedics.  For the indigestion, we did an EKG due to the family history of cardiac disease.  The EKG was normal.  We also gave a GI cocktail which includes Maalox/Mylanta and lidocaine . We will prescribe the following medication to be used as needed for severe reflux symptoms: Carafate  (sucralfate ) 1 tablet 3 times daily as needed for severe gastroesophageal reflux (indigestion). Continue Pepcid  as previous Return to urgent care or PCP if symptoms worsen or fail to resolve.

## 2023-07-15 NOTE — ED Triage Notes (Signed)
 Patient reports that she has had intermittent left arm pain since last night and intermittent indigestion that started today. Patient denies any injury to the left arm.  Patient states she has been taking Tylenol  for left arm pain and already has Pepcid  ordered.

## 2023-07-15 NOTE — ED Provider Notes (Signed)
 [facility]    CSN: [id number] Arrival date & time: 07/15/23  1930      History   Chief Complaint Chief Complaint  Patient presents with   Arm Pain   Gastroesophageal Reflux    HPI Diane Lloyd is a 33 y.o. female.   33 year old female who presents to urgent care with complaints of burning sensation in her chest and stomach and left arm pain especially around the elbow.  She reports that the burning sensation started today after she ate breakfast.  This was a few hours ago.  She takes Pepcid  every day and took it this morning.  This morning she had pancakes and sausage.  Last night she ate [employer].  She reports that this is not unusual.  She has had similar symptoms in the past.  She denies any nausea or vomiting.  She also relates that yesterday she started having left arm pain that was worse with movement in certain directions.  There is also some tingling into the hand.  This is especially tender around the elbow.  She denies any injury to the area.  She denies any fevers, shortness of breath, cough, chest pain    Arm Pain Pertinent negatives include no chest pain, no abdominal pain and no shortness of breath.  Gastroesophageal Reflux Pertinent negatives include no chest pain, no abdominal pain and no shortness of breath.    Past Medical History:  Diagnosis Date   Allergy    Anemia    iron  def   Anxiety    Asthma    Depression    Diabetes mellitus without complication (HCC)    Family history of cardiac disorder 02/17/2011   GERD (gastroesophageal reflux disease)    Gestational diabetes    History of COVID-19 11/01/2019   Hypertension    Obesity, unspecified    PCOS (polycystic ovarian syndrome)    Positive ANA (antinuclear antibody) 05/05/2017   done by [facility] in [facility]; ANA 1:160, speckled pattern    Patient Active Problem List   Diagnosis Date Noted   Acute blood loss anemia 01/10/2023   Status post primary low transverse  cesarean section 01/09/2023   HSV (herpes simplex virus) anogenital infection 01/05/2023   Vaginal bleeding in pregnancy, third trimester 01/02/2023   Anemia affecting pregnancy, antepartum 09/21/2022   Chronic nonintractable headache 04/09/2019   Generalized anxiety disorder with panic attacks 06/20/2018   Allergic rhinitis 05/17/2013   GERD (gastroesophageal reflux disease) 03/20/2012   Polycystic ovarian syndrome 12/26/2011   Family history of cardiac disorder 02/17/2011   Depression, major, single episode, 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/23/2010   Essential hypertension 01/27/2010   Chronic hypertension affecting pregnancy 01/27/2010   ANEMIA, IRON  DEFICIENCY 06/27/2009   Obesity 03/10/2006   ASTHMA, INTERMITTENT 03/10/2006   ECZEMA, ATOPIC DERMATITIS 03/10/2006    Past Surgical History:  Procedure Laterality Date   CESAREAN SECTION N/A 01/09/2023   Procedure: CESAREAN SECTION;  Surgeon: Jayne Vonn DEL, MD;  Location: [facility];  Service: Obstetrics;  Laterality: N/A;   TONSILLECTOMY      OB History     Gravida  1   Para  1   Term      Preterm  1   AB      Living  1      SAB      IAB      Ectopic      Multiple  0   Live Births  1  Home Medications    Prior to Admission medications   Medication Sig Start Date End Date Taking? Authorizing Provider  sucralfate  (CARAFATE ) 1 g tablet Take 1 tablet (1 g total) by mouth 3 (three) times daily as needed (Severe indigestion). 07/15/23  Yes Illianna Paschal A, PA-C  cyclobenzaprine  (FLEXERIL ) 5 MG tablet Take 1 tablet (5 mg total) by mouth 3 (three) times daily as needed (headache). 01/14/23   Jerilynn Longs, NP  famotidine  (PEPCID ) 20 MG tablet Take 20 mg by mouth 2 (two) times daily.    [provider]  ibuprofen  (ADVIL ) 600 MG tablet Take 1 tablet (600 mg total) by mouth every 6 (six) hours. 01/12/23   Cleatus Moccasin, MD  labetalol  (NORMODYNE ) 200 MG tablet Take 400 mg by mouth 2 (two) times daily.    [provider]  NIFEdipine  (PROCARDIA -XL/NIFEDICAL-XL) 30 MG 24 hr tablet Take 30 mg by mouth 2 (two) times daily.    [provider]  sertraline  (ZOLOFT ) 100 MG tablet Take 100 mg by mouth daily.    [provider]  Ubrogepant  (UBRELVY ) 50 MG TABS Take one tablet when you feel a headache, take 2nd tablet if still present in 2 hours. No more than 2 doses in 24 hours. 03/07/23   Alvia Corean CROME, FNP  valACYclovir  (VALTREX ) 500 MG tablet Take 1 tablet (500 mg total) by mouth daily. 03/18/23   Alvia Corean CROME, FNP    Family History Family History  Problem Relation Age of Onset   Cancer Father 49       esophagus   Asthma Father    Esophageal cancer Father    Hypertension Mother    Lupus Mother    Diabetes Mother    Migraines Mother    Cancer Maternal Uncle 63       pancreatic cancer   Cancer Paternal Grandmother 90       esophageal   Anxiety disorder Paternal Aunt    Depression Paternal Aunt    Anxiety disorder Cousin    Colon cancer Neg Hx    Ulcerative colitis Neg Hx     Social History Social History   Tobacco Use   Smoking status: Never   Smokeless tobacco: Never  Vaping Use   Vaping status: Never Used  Substance Use Topics   Alcohol use: Not Currently    Comment: occasional   Drug use: Never     Allergies   Diflucan  [fluconazole ]   Review of Systems Review of Systems  Constitutional:  Negative for chills and fever.  HENT:  Negative for ear pain and sore throat.   Eyes:  Negative for pain and visual disturbance.  Respiratory:  Negative for cough and shortness of breath.   Cardiovascular:  Negative for chest pain and palpitations.  Gastrointestinal:  Negative for abdominal pain and vomiting.       Indigestion  Genitourinary:  Negative for dysuria and hematuria.  Musculoskeletal:  Negative for arthralgias and back pain.       Left arm pain  Skin:  Negative for color change and rash.  Neurological:  Negative for seizures and syncope.   All other systems reviewed and are negative.    Physical Exam Triage Vital Signs ED Triage Vitals [07/15/23 1937]  Encounter Vitals Group     BP (!) 144/86     Girls Systolic BP Percentile      Girls Diastolic BP Percentile      Boys Systolic BP Percentile      Boys Diastolic  BP Percentile      Pulse Rate 83     Resp 16     Temp 98.2 F (36.8 C)     Temp Source Oral     SpO2 97 %     Weight      Height      Head Circumference      Peak Flow      Pain Score 6     Pain Loc      Pain Education      Exclude from Growth Chart    No data found.  Updated Vital Signs BP (!) 144/86 (BP Location: Right Arm)   Pulse 83   Temp 98.2 F (36.8 C) (Oral)   Resp 16   LMP 04/14/2023 (Approximate)   SpO2 97%   Visual Acuity Right Eye Distance:   Left Eye Distance:   Bilateral Distance:    Right Eye Near:   Left Eye Near:    Bilateral Near:     Physical Exam Vitals and nursing note reviewed.  Constitutional:      General: She is not in acute distress.    Appearance: She is well-developed.  HENT:     Head: Normocephalic and atraumatic.     Mouth/Throat:     Mouth: Mucous membranes are moist.  Eyes:     Conjunctiva/sclera: Conjunctivae normal.  Cardiovascular:     Rate and Rhythm: Normal rate and regular rhythm.     Heart sounds: No murmur heard. Pulmonary:     Effort: Pulmonary effort is normal. No respiratory distress.     Breath sounds: Normal breath sounds.  Abdominal:     Palpations: Abdomen is soft.     Tenderness: There is no abdominal tenderness.  Musculoskeletal:        General: No swelling.     Left elbow: No swelling or deformity. Normal range of motion. Tenderness present in lateral epicondyle.     Left wrist: Normal pulse.     Cervical back: Neck supple.  Skin:    General: Skin is warm and dry.     Capillary Refill: Capillary refill takes less than 2 seconds.  Neurological:     Mental Status: She is alert.  Psychiatric:        Mood and Affect:  Mood normal.      UC Treatments / Results  Labs (all labs ordered are listed, but only abnormal results are displayed) Labs Reviewed - No data to display  EKG   Radiology DG Elbow Complete Left Result Date: 07/15/2023 CLINICAL DATA:  Left arm pain at lateral elbow. EXAM: LEFT ELBOW - COMPLETE 3+ VIEW COMPARISON:  None Available. FINDINGS: There is no evidence of fracture, dislocation, or joint effusion. There is no evidence of arthropathy or other focal bone abnormality. Soft tissues are unremarkable. IMPRESSION: Negative. Electronically Signed   By: Franky Crease M.D.   On: 07/15/2023 20:07    Procedures Procedures (including critical care time)  Medications Ordered in UC Medications  alum & mag hydroxide-simeth (MAALOX/MYLANTA) 200-200-20 MG/5ML suspension 30 mL (30 mLs Oral Given 07/15/23 1953)  lidocaine  (XYLOCAINE ) 2 % viscous mouth solution 15 mL (15 mLs Mouth/Throat Given 07/15/23 1954)  ketorolac  (TORADOL ) 30 MG/ML injection 15 mg (15 mg Intramuscular Given 07/15/23 2019)    Initial Impression / Assessment and Plan / UC Course  I have reviewed the triage vital signs and the nursing notes.  Pertinent labs & imaging results that were available during my care of the patient were  reviewed by me and considered in my medical decision making (see chart for details).     Left elbow pain - Plan: DG Elbow Complete Left, DG Elbow Complete Left  Gastroesophageal reflux disease without esophagitis   For the left arm pain, there is pinpoint tenderness around the elbow right around a bursa which is a fluid-filled sac around the joints.  This is likely causing the pain.  Elbow x-ray done today.  Final evaluation by the radiologist is still pending but on brief evaluation there does not appear to be any acute process.  This can be treated with anti-inflammatories such as ibuprofen  and Ice the area 2-3 times daily for 10-15 minutes to help with pain and swelling. Do not apply ice directly to the  skin.  We have recommended the following: Toradol  15 mg injection given today. This is a medication to help with pain. This is not a narcotic.   Voltaren gel 3-4 times daily to the affected area as needed for pain May use ibuprofen  for pain Ice the area 2-3 times daily for 10-15 minutes to help with pain and swelling. Do not apply ice directly to the skin.   If symptoms fail to improve, may need to consider following up with orthopedics.  For the indigestion, we did an EKG due to the family history of cardiac disease.  The EKG was normal.  We also gave a GI cocktail which includes Maalox/Mylanta and lidocaine  with some improvement in symptoms. We will prescribe the following medication to be used as needed for severe reflux symptoms: Carafate  (sucralfate ) 1 tablet 3 times daily as needed for severe gastroesophageal reflux (indigestion). Continue Pepcid  as previous Return to urgent care or PCP if symptoms worsen or fail to resolve.    Final Clinical Impressions(s) / UC Diagnoses   Final diagnoses:  Left elbow pain  Gastroesophageal reflux disease without esophagitis     Discharge Instructions      For the left arm pain, there is pinpoint tenderness around the elbow right around a bursa which is a fluid-filled sac around the joints.  This is likely causing the pain.  Elbow x-ray done today.  Final evaluation by the radiologist is still pending but on brief evaluation there does not appear to be any acute process.  This can be treated with anti-inflammatories such as ibuprofen  and Ice the area 2-3 times daily for 10-15 minutes to help with pain and swelling. Do not apply ice directly to the skin.  We have recommended the following: Toradol  injection given today. This is a medication to help with pain. This is not a narcotic.   Voltaren gel 3-4 times daily to the affected area as needed for pain May use ibuprofen  for pain Ice the area 2-3 times daily for 10-15 minutes to help with pain and  swelling. Do not apply ice directly to the skin.   If symptoms fail to improve, may need to consider following up with orthopedics.  For the indigestion, we did an EKG due to the family history of cardiac disease.  The EKG was normal.  We also gave a GI cocktail which includes Maalox/Mylanta and lidocaine . We will prescribe the following medication to be used as needed for severe reflux symptoms: Carafate  (sucralfate ) 1 tablet 3 times daily as needed for severe gastroesophageal reflux (indigestion). Continue Pepcid  as previous Return to urgent care or PCP if symptoms worsen or fail to resolve.       ED Prescriptions     Medication Sig  Dispense Auth. Provider   sucralfate  (CARAFATE ) 1 g tablet Take 1 tablet (1 g total) by mouth 3 (three) times daily as needed (Severe indigestion). 30 tablet Teresa Almarie LABOR, [place]      PDMP not reviewed this encounter.   Teresa Almarie LABOR, PA-C 07/15/23 2023

## 2023-10-17 ENCOUNTER — Other Ambulatory Visit: Payer: Self-pay

## 2023-10-17 ENCOUNTER — Emergency Department (HOSPITAL_BASED_OUTPATIENT_CLINIC_OR_DEPARTMENT_OTHER)
Admission: EM | Admit: 2023-10-17 | Discharge: 2023-10-17 | Disposition: A | Discharge status: Home / Self Care | Attending: Emergency Medicine | Admitting: Emergency Medicine

## 2023-10-17 ENCOUNTER — Emergency Department (HOSPITAL_BASED_OUTPATIENT_CLINIC_OR_DEPARTMENT_OTHER): Admitting: Radiology

## 2023-10-17 ENCOUNTER — Encounter (HOSPITAL_BASED_OUTPATIENT_CLINIC_OR_DEPARTMENT_OTHER): Payer: Self-pay | Admitting: Emergency Medicine

## 2023-10-17 DIAGNOSIS — R002 Palpitations: Secondary | ICD-10-CM | POA: Insufficient documentation

## 2023-10-17 LAB — CBC WITH DIFFERENTIAL/PLATELET
Abs Immature Granulocytes: 0.02 K/uL (ref 0.00–0.07)
Basophils Absolute: 0 K/uL (ref 0.0–0.1)
Basophils Relative: 0 %
Eosinophils Absolute: 0.1 K/uL (ref 0.0–0.5)
Eosinophils Relative: 1 %
HCT: 36.7 % (ref 36.0–46.0)
Hemoglobin: 11.6 g/dL — ABNORMAL LOW (ref 12.0–15.0)
Immature Granulocytes: 0 %
Lymphocytes Relative: 26 %
Lymphs Abs: 1.5 K/uL (ref 0.7–4.0)
MCH: 25.4 pg — ABNORMAL LOW (ref 26.0–34.0)
MCHC: 31.6 g/dL (ref 30.0–36.0)
MCV: 80.3 fL (ref 80.0–100.0)
Monocytes Absolute: 0.4 K/uL (ref 0.1–1.0)
Monocytes Relative: 6 %
Neutro Abs: 3.8 K/uL (ref 1.7–7.7)
Neutrophils Relative %: 67 %
Platelets: 320 K/uL (ref 150–400)
RBC: 4.57 MIL/uL (ref 3.87–5.11)
RDW: 15.6 % — ABNORMAL HIGH (ref 11.5–15.5)
WBC: 5.7 K/uL (ref 4.0–10.5)
nRBC: 0 % (ref 0.0–0.2)

## 2023-10-17 LAB — BASIC METABOLIC PANEL WITH GFR
Anion gap: 13 (ref 5–15)
BUN: 9 mg/dL (ref 6–20)
CO2: 22 mmol/L (ref 22–32)
Calcium: 10.1 mg/dL (ref 8.9–10.3)
Chloride: 104 mmol/L (ref 98–111)
Creatinine, Ser: 0.82 mg/dL (ref 0.44–1.00)
GFR, Estimated: >60 mL/min (ref >60)
Glucose, Bld: 99 mg/dL (ref 70–99)
Potassium: 4.1 mmol/L (ref 3.5–5.1)
Sodium: 138 mmol/L (ref 135–145)

## 2023-10-17 LAB — TROPONIN T, HIGH SENSITIVITY: Troponin T High Sensitivity: <15 ng/L (ref 0–19)

## 2023-10-17 LAB — D-DIMER, QUANTITATIVE: D-Dimer, Quant: <0.27 ug{FEU}/mL (ref 0.00–0.50)

## 2023-10-17 NOTE — ED Provider Notes (Signed)
 [place] EM[facility] DEPARTMENT AT [facility] Provider Note   CSN: [id number] Arrival date & time: 10/17/23  1009     Patient presents with: Chest Pain   Diane Lloyd is a 33 y.o. female.   The history is provided by the patient.  Palpitations Palpitations quality:  Fast Onset quality:  Gradual Timing:  Intermittent Progression:  Waxing and waning Chronicity:  Recurrent Relieved by:  Nothing Worsened by:  Nothing Associated symptoms: no back pain, no chest pain, no chest pressure, no cough, no diaphoresis, no dizziness, no hemoptysis, no leg pain, no lower extremity edema, no malaise/fatigue, no nausea, no near-syncope, no numbness, no orthopnea, no PND, no shortness of breath, no syncope, no vomiting and no weakness   Risk factors comment:  DM, HTN      Prior to Admission medications   Medication Sig Start Date End Date Taking? Authorizing Provider  cyclobenzaprine  (FLEXERIL ) 5 MG tablet Take 1 tablet (5 mg total) by mouth 3 (three) times daily as needed (headache). 01/14/23   Jerilynn Longs, NP  famotidine  (PEPCID ) 20 MG tablet Take 20 mg by mouth 2 (two) times daily.    [provider]  ibuprofen  (ADVIL ) 600 MG tablet Take 1 tablet (600 mg total) by mouth every 6 (six) hours. 01/12/23   Cleatus Moccasin, MD  labetalol  (NORMODYNE ) 200 MG tablet Take 400 mg by mouth 2 (two) times daily.    [provider]  NIFEdipine  (PROCARDIA -XL/NIFEDICAL-XL) 30 MG 24 hr tablet Take 30 mg by mouth 2 (two) times daily.    [provider]  sertraline  (ZOLOFT ) 100 MG tablet Take 100 mg by mouth daily.    [provider]  sucralfate  (CARAFATE ) 1 g tablet Take 1 tablet (1 g total) by mouth 3 (three) times daily as needed (Severe indigestion). 07/15/23   White, Elizabeth A, PA-C  Ubrogepant  (UBRELVY ) 50 MG TABS Take one tablet when you feel a headache, take 2nd tablet if still present in 2 hours. No more than 2 doses in 24 hours. 03/07/23   Alvia Corean CROME, FNP  valACYclovir  (VALTREX ) 500 MG tablet Take 1 tablet (500 mg total) by mouth daily. 03/18/23   Alvia Corean CROME, FNP    Allergies: Diflucan  [fluconazole ]    Review of Systems  Constitutional:  Negative for diaphoresis and malaise/fatigue.  Respiratory:  Negative for cough, hemoptysis and shortness of breath.   Cardiovascular:  Positive for palpitations. Negative for chest pain, orthopnea, syncope, PND and near-syncope.  Gastrointestinal:  Negative for nausea and vomiting.  Musculoskeletal:  Negative for back pain.  Neurological:  Negative for dizziness, weakness and numbness.    Updated Vital Signs BP (!) 166/111 (BP Location: Right Arm)   Pulse 83   Temp 98.1 F (36.7 C) (Oral)   Resp 13   Wt 122 kg   LMP 09/26/2023   SpO2 100%   BMI 38.60 kg/m   Physical Exam Vitals and nursing note reviewed.  Constitutional:      General: She is not in acute distress.    Appearance: She is well-developed. She is not ill-appearing.  HENT:     Head: Normocephalic and atraumatic.  Eyes:     Extraocular Movements: Extraocular movements intact.     Conjunctiva/sclera: Conjunctivae normal.     Pupils: Pupils are equal, round, and reactive to light.  Cardiovascular:     Rate and Rhythm: Normal rate and regular rhythm.     Pulses:          Radial  pulses are 2+ on the right side and 2+ on the left side.     Heart sounds: Normal heart sounds. No murmur heard. Pulmonary:     Effort: Pulmonary effort is normal. No respiratory distress.     Breath sounds: Normal breath sounds. No decreased breath sounds or wheezing.  Abdominal:     Palpations: Abdomen is soft.     Tenderness: There is no abdominal tenderness.  Musculoskeletal:        General: No swelling.     Cervical back: Normal range of motion and neck supple.  Skin:    General: Skin is warm and dry.     Capillary Refill: Capillary refill takes less than 2 seconds.  Neurological:     General: No focal deficit  present.     Mental Status: She is alert.  Psychiatric:        Mood and Affect: Mood normal.     (all labs ordered are listed, but only abnormal results are displayed) Labs Reviewed  CBC WITH DIFFERENTIAL/PLATELET - Abnormal; Notable for the following components:      Result Value   Hemoglobin 11.6 (*)    MCH 25.4 (*)    RDW 15.6 (*)    All other components within normal limits  BASIC METABOLIC PANEL WITH GFR  D-DIMER, QUANTITATIVE  TROPONIN T, HIGH SENSITIVITY    EKG: EKG Interpretation Date/Time:  Monday October 17 2023 10:17:59 EDT Ventricular Rate:  83 PR Interval:  124 QRS Duration:  93 QT Interval:  372 QTC Calculation: 438 R Axis:   65  Text Interpretation: Sinus rhythm Confirmed by Ruthe Cornet [id number]) on 10/17/2023 10:41:24 AM  Radiology: DG Chest 2 View Result Date: 10/17/2023 EXAM: 2 VIEW(S) XRAY OF THE CHEST 10/17/2023 11:15:00 AM COMPARISON: 06/22/2023 CLINICAL HISTORY: cp. c/o mid-sternal and LT side CP with heart racing and shob x 4 day FINDINGS: LUNGS AND PLEURA: No focal pulmonary opacity. No pulmonary edema. No pleural effusion. No pneumothorax. HEART AND MEDIASTINUM: No acute abnormality of the cardiac and mediastinal silhouettes. BONES AND SOFT TISSUES: No acute osseous abnormality. IMPRESSION: 1. Normal chest radiograph. No acute cardiopulmonary process. Electronically signed by: Evalene Coho MD 10/17/2023 11:54 AM EDT RP Workstation: [id number]     Procedures   Medications Ordered in the ED - No data to display                                  Medical Decision Making Amount and/or Complexity of Data Reviewed Labs: ordered. Radiology: ordered.   Diane Lloyd is here with palpitations.  Normal vitals.  No fever.  Differential diagnosis likely PACs PVCs but will evaluate for ACS PE infectious process pneumonia pneumothorax.  Lab work chest x-ray obtained.  EKG shows sinus rhythm.  No ischemic changes.  Per my review interpretation the  labs and images.  There is no significant leukocytosis anemia or electrolyte abnormality.  Troponin normal.  D-dimer normal.  Chest x-ray no evidence of pneumonia pneumothorax.  I reviewed interpreted labs and imaging EKG.  Suspect PACs PVCs/stress related process.  Follow-up with primary care.  Discharged in good condition.  Have no concern for other acute process.  This chart was dictated using voice recognition software.  Despite best efforts to proofread,  errors can occur which can change the documentation meaning.      Final diagnoses:  Palpitations    ED Discharge Orders     None  Ruthe Cornet, DO 10/17/23 1209

## 2023-10-17 NOTE — ED Triage Notes (Signed)
 Pt c/o mid-sternal and LT side CP with heart racing and shob x 4 days

## 2023-10-17 NOTE — ED Notes (Signed)
 Pt d/c instructions, medications, and follow-up care reviewed with pt. Pt verbalized understanding and had no further questions at time of d/c. Pt CA&Ox4, ambulatory, and in NAD at time of d/c

## 2023-11-20 ENCOUNTER — Ambulatory Visit
Admission: EM | Admit: 2023-11-20 | Discharge: 2023-11-20 | Disposition: A | Discharge status: Home / Self Care | Attending: Family Medicine | Admitting: Family Medicine

## 2023-11-20 DIAGNOSIS — G43809 Other migraine, not intractable, without status migrainosus: Secondary | ICD-10-CM | POA: Diagnosis not present

## 2023-11-20 MED ORDER — DEXAMETHASONE SOD PHOSPHATE PF 10 MG/ML IJ SOLN: 10.0000 mg | Freq: Once | INTRAMUSCULAR | Status: DC

## 2023-11-20 MED ORDER — KETOROLAC TROMETHAMINE 30 MG/ML IJ SOLN
30.0000 mg | Freq: Once | INTRAMUSCULAR | Status: AC
  Administered 2023-11-20: 30 mg via INTRAMUSCULAR

## 2023-11-20 MED ORDER — ONDANSETRON 4 MG PO TBDP
4.0000 mg | ORAL_TABLET | Freq: Once | ORAL | Status: AC
  Administered 2023-11-20: 4 mg via ORAL

## 2023-11-20 MED ORDER — CELECOXIB 200 MG PO CAPS: 200.0000 mg | ORAL_CAPSULE | Freq: Two times a day (BID) | ORAL | 0 refills | Status: DC | PRN

## 2023-11-20 NOTE — ED Provider Notes (Addendum)
 [facility] URGENT [facility]ARE CENTER  Note:  This document was prepared using Conservation officer, historic buildings and may include unintentional dictation errors.  MRN: [id number] DOB: [birth year]  Subjective:   Diane Lloyd is a 33 y.o. female presenting for 1 week history of persistent intermittent migraines, nausea, dizziness, photophobia.  Patient has used ibuprofen  and Tylenol  without any relief.  Has a history of migraines and has used multiple migraine medications in the past.  Has failed triptan medications.  Does not do well with the Reglan .  Patient does have follow-up with a neurologist to decide her treatment plan going forward.  No fever, sinus symptoms, weakness, numbness or tingling, history of stroke, aneurysm.  No other known urologic conditions.  No current facility-administered medications for this encounter.  Current Outpatient Medications:    hydrochlorothiazide  (HYDRODIURIL ) 12.5 MG tablet, Take 12.5 mg by mouth., Disp: , Rfl:    ibuprofen  (ADVIL ) 600 MG tablet, Take 1 tablet (600 mg total) by mouth every 6 (six) hours., Disp: 30 tablet, Rfl: 0   labetalol  (NORMODYNE ) 200 MG tablet, Take 400 mg by mouth 2 (two) times daily., Disp: , Rfl:    NIFEdipine  (PROCARDIA -XL/NIFEDICAL-XL) 30 MG 24 hr tablet, Take 30 mg by mouth 2 (two) times daily., Disp: , Rfl:    pantoprazole  (PROTONIX ) 40 MG tablet, Take 40 mg by mouth., Disp: , Rfl:    sertraline  (ZOLOFT ) 100 MG tablet, Take 100 mg by mouth daily., Disp: , Rfl:    valACYclovir  (VALTREX ) 500 MG tablet, Take 1 tablet (500 mg total) by mouth daily., Disp: 90 tablet, Rfl: 1   cyclobenzaprine  (FLEXERIL ) 5 MG tablet, Take 1 tablet (5 mg total) by mouth 3 (three) times daily as needed (headache)., Disp: 15 tablet, Rfl: 0   famotidine  (PEPCID ) 20 MG tablet, Take 20 mg by mouth 2 (two) times daily., Disp: , Rfl:    sucralfate  (CARAFATE ) 1 g tablet, Take 1 tablet (1 g total) by mouth 3 (three) times daily as needed (Severe indigestion).,  Disp: 30 tablet, Rfl: 2   Ubrogepant  (UBRELVY ) 50 MG TABS, Take one tablet when you feel a headache, take 2nd tablet if still present in 2 hours. No more than 2 doses in 24 hours., Disp: 9 tablet, Rfl: 1   Allergies  Allergen Reactions   Diflucan  [Fluconazole ] Itching and Rash    Past Medical History:  Diagnosis Date   Allergy    Anemia    iron  def   Anxiety    Asthma    Depression    Diabetes mellitus without complication (HCC)    Family history of cardiac disorder 02/17/2011   GERD (gastroesophageal reflux disease)    Gestational diabetes    History of COVID-19 11/01/2019   Hypertension    Obesity, unspecified    PCOS (polycystic ovarian syndrome)    Positive ANA (antinuclear antibody) 05/05/2017   done by [facility] in [facility]; ANA 1:160, speckled pattern     Past Surgical History:  Procedure Laterality Date   CESAREAN SECTION N/A 01/09/2023   Procedure: CESAREAN SECTION;  Surgeon: Jayne Vonn DEL, MD;  Location: [facility] LD ORS;  Service: Obstetrics;  Laterality: N/A;   TONSILLECTOMY      Family History  Problem Relation Age of Onset   Cancer Father 1       esophagus   Asthma Father    Esophageal cancer Father    Hypertension Mother    Lupus Mother    Diabetes Mother    Migraines Mother  Cancer Maternal Uncle 40       pancreatic cancer   Cancer Paternal Grandmother 82       esophageal   Anxiety disorder Paternal Aunt    Depression Paternal Aunt    Anxiety disorder Cousin    Colon cancer Neg Hx    Ulcerative colitis Neg Hx     Social History   Tobacco Use   Smoking status: Never   Smokeless tobacco: Never  Vaping Use   Vaping status: Never Used  Substance Use Topics   Alcohol use: Not Currently    Comment: occasional   Drug use: Never    ROS   Objective:   Vitals: BP 122/77 (BP Location: Right Arm)   Pulse 81   Temp 98.1 F (36.7 C) (Oral)   Resp 18   Ht 5' 10 (1.778 m)   Wt 268 lb (121.6 kg)   LMP 10/23/2023   SpO2 96%    BMI 38.45 kg/m   Physical Exam Constitutional:      General: She is not in acute distress.    Appearance: Normal appearance. She is well-developed and normal weight. She is not ill-appearing, toxic-appearing or diaphoretic.  HENT:     Head: Normocephalic and atraumatic.     Right Ear: Tympanic membrane, ear canal and external ear normal. No drainage or tenderness. No middle ear effusion. There is no impacted cerumen. Tympanic membrane is not erythematous or bulging.     Left Ear: Tympanic membrane, ear canal and external ear normal. No drainage or tenderness.  No middle ear effusion. There is no impacted cerumen. Tympanic membrane is not erythematous or bulging.     Nose: Nose normal. No congestion or rhinorrhea.     Mouth/Throat:     Mouth: Mucous membranes are moist. No oral lesions.     Pharynx: No pharyngeal swelling, oropharyngeal exudate, posterior oropharyngeal erythema or uvula swelling.     Tonsils: No tonsillar exudate or tonsillar abscesses.  Eyes:     General: No scleral icterus.       Right eye: No discharge.        Left eye: No discharge.     Extraocular Movements: Extraocular movements intact.     Right eye: Normal extraocular motion.     Left eye: Normal extraocular motion.     Conjunctiva/sclera: Conjunctivae normal.  Neck:     Meningeal: Brudzinski's sign and Kernig's sign absent.  Cardiovascular:     Rate and Rhythm: Normal rate.  Pulmonary:     Effort: Pulmonary effort is normal.  Musculoskeletal:     Cervical back: Normal range of motion and neck supple.  Lymphadenopathy:     Cervical: No cervical adenopathy.  Skin:    General: Skin is warm and dry.  Neurological:     General: No focal deficit present.     Mental Status: She is alert and oriented to person, place, and time.     Cranial Nerves: No cranial nerve deficit, dysarthria or facial asymmetry.     Motor: No weakness or pronator drift.     Coordination: Romberg sign negative. Coordination normal.  Finger-Nose-Finger Test and Heel to [facility] Test normal. Rapid alternating movements normal.     Gait: Gait and tandem walk normal.     Deep Tendon Reflexes: Reflexes normal.     Comments: No facial asymmetry.  Negative Romberg and pronator drift.  Psychiatric:        Mood and Affect: Mood normal.  Behavior: Behavior normal.        Thought Content: Thought content normal.        Judgment: Judgment normal.    IM Toradol  30 mg, p.o. Zofran  4 mg administered in clinic.   Assessment and Plan :   PDMP not reviewed this encounter.  1. Other migraine without status migrainosus, not intractable    No signs of an acute encephalopathy.  Migraine interventions as above.  Can use celecoxib as an outpatient.  Keep follow-up with the neurologist.  Counseled patient on potential for adverse effects with medications prescribed/recommended today, ER and return-to-clinic precautions discussed, patient verbalized understanding.    Christopher Savannah, PA-C 11/20/23 1556

## 2023-11-20 NOTE — ED Triage Notes (Signed)
 Patient c/o migraine and dizziness with onset x 1 week. Pt stated that she took Ibuprofen  and Tylenol  , last taken last night which were not effective. Pt stated she has a h/o migraine however haven't  had one since one month ago.

## 2023-11-24 ENCOUNTER — Emergency Department (HOSPITAL_BASED_OUTPATIENT_CLINIC_OR_DEPARTMENT_OTHER)
Admission: EM | Admit: 2023-11-24 | Discharge: 2023-11-24 | Discharge status: Against Medical Advice | Attending: Emergency Medicine | Admitting: Emergency Medicine

## 2023-11-24 ENCOUNTER — Encounter (HOSPITAL_BASED_OUTPATIENT_CLINIC_OR_DEPARTMENT_OTHER): Payer: Self-pay

## 2023-11-24 ENCOUNTER — Other Ambulatory Visit: Payer: Self-pay

## 2023-11-24 DIAGNOSIS — G43909 Migraine, unspecified, not intractable, without status migrainosus: Secondary | ICD-10-CM | POA: Insufficient documentation

## 2023-11-24 DIAGNOSIS — Z5321 Procedure and treatment not carried out due to patient leaving prior to being seen by health care provider: Secondary | ICD-10-CM | POA: Diagnosis not present

## 2023-11-24 NOTE — ED Triage Notes (Signed)
 Patient reports week long migraine and nausea. She says she has hx of migraines, she now has some intermittent tingling in her hands that has been going on since her migraine onset. Said she went to urgent care and was given an Toradol  shot which worked well, and then is unaware of the other medication they gave her.

## 2024-01-16 NOTE — Progress Notes (Signed)
 Diane Lloyd is a 34 y.o.  with  reports that she has never smoked. She has never used smokeless tobacco. with  Active Ambulatory Problems    Diagnosis Date Noted   Essential hypertension 01/27/2010   GERD (gastroesophageal reflux disease) 03/20/2012   Tachycardia 07/15/2022   Resolved Ambulatory Problems    Diagnosis Date Noted   No Resolved Ambulatory Problems   Past Medical History:  Diagnosis Date   Anxiety    Coronavirus infection Resolved: [id number]   Genital herpes    Hypertension    who presents today at Urgent Care for  Chief Complaint  Patient presents with   Chest Pain    Chest pain x a few days. Reports pain radiates into back, described as a dull ache denies SOB, dizziness, or jaw pain. Reports HA is present and b/p is normal at home       History of Present Illness This is a 34 year old female presenting with chest pain for a few days.  She reports the pain radiates into the back and describes it as a dull ache. She does not experience shortness of breath, dizziness, or jaw pain but does report a headache. Her blood pressure is normal at home. The chest pain began a few days ago and is located in the chest, radiating to the back. The pain is described as a dull ache and worsens with movement. She suspects the cause to be either muscular or related to acid reflux. She has been experiencing an acidic taste in her mouth, which she believes has worsened recently. She has been managing the pain with ibuprofen . She has previously tried famotidine  twice daily, which provided some relief, and is currently on pantoprazole  twice daily. Her primary care physician suggested combining both medications, but she is hesitant as she prefers a more effective solution. She acknowledges the need for dietary improvements. She reports a burning sensation in her stomach, which she feels has worsened. She underwent an endoscopy last week, during which her esophagus was  slightly dilated due to observed inflammation. She has also tried omeprazole , Pepcid , and Protonix , but found them ineffective. She has not tried cimetidine.      Patient has no co-morbidities  or medications that might increase the risk for developing a severe infection     reports that she has never smoked. She has never used smokeless tobacco.  Review of Systems  Review of systems is otherwise negative except as noted in the HPI and Assessment/MDM   Physical Exam  BP 136/80 (BP Location: Right arm, Patient Position: Sitting)   Pulse 77   Temp 99.1 F (37.3 C) (Oral)   Ht 1.778 m (5' 10)   Wt 123 kg (272 lb)   LMP 01/04/2024 (Approximate)   SpO2 100%   BMI 39.03 kg/m   Constitutional:      General: Patient is not in acute distress.    Appearance: Normal appearance.  Neurological:     General: No focal deficit present.     Mental Status: alert and oriented to person, place, and time.  HENT:     Eyes:     Conjunctiva/sclera: Conjunctivae normal. Pharynx normal    There is no associated anterior cervical lymphadenopathy    Pupils: Pupils are equal, round, and reactive to light.     TM's normal with no visible effusion  Cardiovascular:     Heart sounds: Normal heart sounds. No murmur heard.    No Lower extremity edema noted Pulmonary:  Effort: Pulmonary effort is normal. No respiratory distress.     Breath sounds: No wheezing, rhonchi or rales.  Abdominal:     General: Bowel sounds are normal. There is no distension.     Tenderness: There is no abdominal tenderness.  Musculoskeletal:        General: Normal range of motion.     Neck:  No rigidity.  Skin:    General: Skin is warm and dry.  Psychiatric:        Mood and Affect: Mood normal.   No orders to display    EKG: Normal sinus rhythm Vent Rate:  74 NonspecificT waves    NO ST elevation or Depression No Q waves PR Interval in ms:  132  QRS Duration in ms:  90  QT/Qtc-Baz in ms:  394/437   P-R-T axes:            15/14/11   No previous EKG to compare with             DIAGNOSIS/PLAN     1. Gastroesophageal reflux disease without esophagitis  alum-mag hydroxide-simethicone  (MAALOX, MYLANTA) 200-200-20 mg/5 mL suspension 30 mL   lidocaine  (XYLOCAINE ) 2 % viscous solution 15 mL   lansoprazole (PREVACID) 30 mg delayed-release capsule   lidocaine  (Lidocaine  Viscous) 2 % viscous solution   POC HCG Qualitative, Urine   cimetidine (Tagamet HB) 200 mg tab tablet    2. Chest pain, unspecified type  ECG 12 lead   D-Dimer, Quantitative      Assessment & Plan Initial Assessment: 34 year old female with chest pain radiating to the back, described as a dull ache, with associated headache but no shortness of breath, dizziness, or jaw pain. Suspected acid reflux or muscular issues.  Differential Diagnosis: - Acid reflux: Reports acidic taste, recent endoscopy showed esophageal inflammation. Plan: Administer GI cocktail, prescribe viscous lidocaine , advise on Maalox and lidocaine  mixture, suggest dietary changes, and turmeric. - Muscular pain: Pain worsens with movement. Plan: Monitor response to GI cocktail.  ED Course: - Administered GI cocktail. - Prescribed viscous lidocaine . - Advised to mix 2 tablespoons of Maalox with 1 tablespoon of lidocaine , shake vigorously, and use as needed.  Final Assessment: Administered GI cocktail with partial relief of chest pain. Prescribed viscous lidocaine  and advised on Maalox and lidocaine  mixture for temporary relief. Suggested dietary changes and turmeric for anti-inflammatory benefits.  Clinical Impression: - Chest pain - Gastroesophageal Reflux Disease (GERD)  Patient Education: Advised on using Maalox and lidocaine  mixture for temporary relief. Suggested dietary changes and adding turmeric for anti-inflammatory benefits.  Portions of this note were created using the aid of voice recognition Dragon/DAX dictation software.    We discussed risks and side effects of medications, and also discussed red flags which would warrant immediate follow-up.   Urgent Care Disposition:  Home Care

## 2024-01-17 NOTE — Progress Notes (Signed)
 D-dimer negative. Patient has seen results.

## 2024-01-19 ENCOUNTER — Other Ambulatory Visit: Payer: Self-pay | Admitting: Urology

## 2024-01-24 NOTE — Patient Instructions (Signed)
 SURGICAL WAITING ROOM VISITATION Patients having surgery or a procedure may have no more than 2 support people in the waiting area - these visitors may rotate in the visitor waiting room.   Due to an increase in RSV and influenza rates and associated hospitalizations, children ages 7 and under may not visit patients in [facility] hospitals. If the patient needs to stay at the hospital during part of their recovery, the visitor guidelines for inpatient rooms apply.  PRE-OP VISITATION  Pre-op nurse will coordinate an appropriate time for 1 support person to accompany the patient in pre-op.  This support person may not rotate.  This visitor will be contacted when the time is appropriate for the visitor to come back in the pre-op area.  Please refer to the [facility] website for the visitor guidelines for Inpatients (after your surgery is over and you are in a regular room).  You are not required to quarantine at this time prior to your surgery. However, you must do this: Hand Hygiene often Do NOT share personal items Notify your provider if you are in close contact with someone who has COVID or you develop fever 100.4 or greater, new onset of sneezing, cough, sore throat, shortness of breath or body aches.  If you test positive for Covid or have been in contact with anyone that has tested positive in the last 10 days please notify you surgeon.    Your procedure is scheduled on:  01/31/24  Report to [facility] Main Entrance: [place] entrance where the [employer] is available.   Report to admitting at: 8:15 AM  Call this number if you have any questions or problems the morning of surgery [phone]  FOLLOW ANY ADDITIONAL PRE OP INSTRUCTIONS YOU RECEIVED FROM YOUR SURGEON'S OFFICE!!!  Do not eat food or drink fluids after Midnight the night prior to your surgery/procedure.   Oral Hygiene is also important to reduce your risk of infection.        Remember - BRUSH YOUR TEETH  THE MORNING OF SURGERY WITH YOUR REGULAR TOOTHPASTE  Do NOT smoke after Midnight the night before surgery.  STOP TAKING all Vitamins, Herbs and supplements 1 week before your surgery.   Take ONLY these medicines the morning of surgery with A SIP OF WATER : lamotrigine,sertraline ,labetalol ,nifedipine ,pantoprazole .                  You may not have any metal on your body including hair pins, jewelry, and body piercing  Do not wear make-up, lotions, powders, perfumes / cologne, or deodorant  Do not wear nail polish including gel and S&S, artificial / acrylic nails, or any other type of covering on natural nails including finger and toenails. If you have artificial nails, gel coating, etc., that needs to be removed by a nail salon, Please have this removed prior to surgery. Not doing so may mean that your surgery could be cancelled or delayed if the Surgeon or anesthesia staff feels like they are unable to monitor you safely.   Do not shave 48 hours prior to surgery to avoid nicks in your skin which may contribute to postoperative infections.   Contacts, Hearing Aids, dentures or bridgework may not be worn into surgery. DENTURES WILL BE REMOVED PRIOR TO SURGERY PLEASE DO NOT APPLY Poly grip OR ADHESIVES!!!  You may bring a small overnight bag with you on the day of surgery, only pack items that are not valuable. [facility] IS NOT RESPONSIBLE   FOR  VALUABLES THAT ARE LOST OR STOLEN.   Patients discharged on the day of surgery will not be allowed to drive home.  Someone NEEDS to stay with you for the first 24 hours after anesthesia.  Do not bring your home medications to the hospital. The Pharmacy will dispense medications listed on your medication list to you during your admission in the Hospital.  Special Instructions: Bring a copy of your healthcare power of attorney and living will documents the day of surgery, if you wish to have them scanned into your [facility] Records-  EPIC  Please read over the following fact sheets you were given: IF YOU HAVE QUESTIONS ABOUT YOUR PRE-OP INSTRUCTIONS, PLEASE CALL [phone]   [facility] - Preparing for Surgery      Before surgery, you can play an important role.  Because skin is not sterile, your skin needs to be as free of germs as possible.  You can reduce the number of germs on your skin by washing with CHG (chlorahexidine gluconate) soap before surgery.  CHG is an antiseptic cleaner which kills germs and bonds with the skin to continue killing germs even after washing. Please DO NOT use if you have an allergy to CHG or antibacterial soaps.  If your skin becomes reddened/irritated stop using the CHG and inform your nurse when you arrive at Short Stay. Do not shave (including legs and underarms) for at least 48 hours prior to the first CHG shower.  You may shave your face/neck.  Please follow these instructions carefully:  1.  Shower with CHG Soap the night before surgery ONLY (DO NOT USE THE SOAP THE MORNING OF SURGERY).  2.  If you choose to wash your hair, wash your hair first as usual with your normal  shampoo.  3.  After you shampoo, rinse your hair and body thoroughly to remove the shampoo.                             4.  Use CHG as you would any other liquid soap.  You can apply chg directly to the skin and wash.  Gently with a scrungie or clean washcloth.  5.  Apply the CHG Soap to your body ONLY FROM THE NECK DOWN.   Do not use on face/ open                           Wound or open sores. Avoid contact with eyes, ears mouth and genitals (private parts).                       Wash face,  Genitals (private parts) with your normal soap.             6.  Wash thoroughly, paying special attention to the area where your  surgery  will be performed.  7.  Thoroughly rinse your body with warm water  from the neck down.  8.  DO NOT shower/wash with your normal soap after using and rinsing off the CHG Soap.                9.   Pat yourself dry with a clean towel.            10.  Wear clean pajamas.            11.  Place clean sheets on your bed the night of your  first shower and do not  sleep with pets.  Day of Surgery : Do not apply any CHG, lotions/deodorants the morning of surgery.  Please wear clean clothes to the hospital/surgery center.   FAILURE TO FOLLOW THESE INSTRUCTIONS MAY RESULT IN THE CANCELLATION OF YOUR SURGERY  PATIENT SIGNATURE_________________________________  NURSE SIGNATURE__________________________________  ________________________________________________________________________

## 2024-01-25 ENCOUNTER — Encounter (HOSPITAL_COMMUNITY)
Admission: RE | Admit: 2024-01-25 | Discharge: 2024-01-25 | Disposition: A | Source: Ambulatory Visit | Attending: Physician Assistant | Admitting: Physician Assistant

## 2024-01-25 DIAGNOSIS — I10 Essential (primary) hypertension: Secondary | ICD-10-CM

## 2024-01-25 DIAGNOSIS — Z01818 Encounter for other preprocedural examination: Secondary | ICD-10-CM

## 2024-01-25 NOTE — Progress Notes (Signed)
 Pt. Did not show to PST appointment.Unable to reach pt. Over the phone.

## 2024-01-27 NOTE — Progress Notes (Signed)
 Date of COVID positive in last 90 days:  PCP - Joesph Cedar, PA-C Cardiologist - Madan Badal Pulmonologist - Selby Packer  Chest x-ray - 10-17-23 Epic EKG - 10-20-23 Epic, 01-20-24 [facility] Stress Test - 08-01-20 [facility] ECHO - 04-22-23 [facility] Epic Cardiac Cath - 10-29-21 [facility] Coronary CT - 02-09-19 Epic MR Cardiac - 12-28-18 Epic Long Term Monitor - 2025 [facility]  Pacemaker/ICD device last checked:N/A Spinal Cord Stimulator:N/A  Bowel Prep - N/A  Sleep Study - N/A CPAP -   Fasting Blood Sugar - N/A Checks Blood Sugar _____ times a day  Last dose of GLP1 agonist-  N/A GLP1 instructions:  Do not take after     Last dose of SGLT-2 inhibitors-  N/A SGLT-2 instructions:  Do not take after     Blood Thinner Instructions: N/A Last dose:   Time: Aspirin  Instructions:N/A Last Dose:  Activity level:  Can go up a flight of stairs and perform activities of daily living without stopping and without symptoms of chest pain or shortness of breath.  Able to exercise without symptoms  Unable to go up a flight of stairs without symptoms of     Anesthesia review:  Chest pain, palpitations followed by cardiology, HTN  Patient denies shortness of breath, fever, cough and chest pain at PAT appointment  Patient verbalized understanding of instructions that were given to them at the PAT appointment. Patient was also instructed that they will need to review over the PAT instructions again at home before surgery.

## 2024-01-30 NOTE — Progress Notes (Signed)
 Patient was a no show for her preop visit and multiple attempts made to contact via telephone with no success.  Surgeon's office notified that patient was not reached and they stated that they spoke to her last week and she plans to have surgery.

## 2024-01-31 ENCOUNTER — Encounter (HOSPITAL_COMMUNITY): Payer: Self-pay | Admitting: Urology

## 2024-01-31 ENCOUNTER — Encounter (HOSPITAL_COMMUNITY)
Admission: RE | Disposition: A | Discharge status: Home / Self Care | Payer: Self-pay | Source: Home / Self Care | Attending: Urology

## 2024-01-31 ENCOUNTER — Ambulatory Visit (HOSPITAL_COMMUNITY): Payer: Self-pay | Admitting: Anesthesiology

## 2024-01-31 ENCOUNTER — Other Ambulatory Visit: Payer: Self-pay

## 2024-01-31 ENCOUNTER — Ambulatory Visit (HOSPITAL_COMMUNITY)
Admission: RE | Admit: 2024-01-31 | Discharge: 2024-01-31 | Disposition: A | Payer: Self-pay | Attending: Urology | Admitting: Urology

## 2024-01-31 DIAGNOSIS — F419 Anxiety disorder, unspecified: Secondary | ICD-10-CM | POA: Insufficient documentation

## 2024-01-31 DIAGNOSIS — Z79899 Other long term (current) drug therapy: Secondary | ICD-10-CM | POA: Insufficient documentation

## 2024-01-31 DIAGNOSIS — Z01818 Encounter for other preprocedural examination: Secondary | ICD-10-CM

## 2024-01-31 DIAGNOSIS — D649 Anemia, unspecified: Secondary | ICD-10-CM | POA: Insufficient documentation

## 2024-01-31 DIAGNOSIS — K219 Gastro-esophageal reflux disease without esophagitis: Secondary | ICD-10-CM | POA: Insufficient documentation

## 2024-01-31 DIAGNOSIS — E119 Type 2 diabetes mellitus without complications: Secondary | ICD-10-CM | POA: Insufficient documentation

## 2024-01-31 DIAGNOSIS — N3582 Other urethral stricture, female: Secondary | ICD-10-CM

## 2024-01-31 DIAGNOSIS — R3129 Other microscopic hematuria: Secondary | ICD-10-CM | POA: Insufficient documentation

## 2024-01-31 DIAGNOSIS — I10 Essential (primary) hypertension: Secondary | ICD-10-CM | POA: Insufficient documentation

## 2024-01-31 DIAGNOSIS — F32A Depression, unspecified: Secondary | ICD-10-CM | POA: Insufficient documentation

## 2024-01-31 HISTORY — PX: CYSTOSCOPY WITH URETHRAL DILATATION: SHX5125

## 2024-01-31 LAB — CBC
HCT: 33.7 % — ABNORMAL LOW (ref 36.0–46.0)
Hemoglobin: 10.4 g/dL — ABNORMAL LOW (ref 12.0–15.0)
MCH: 24.5 pg — ABNORMAL LOW (ref 26.0–34.0)
MCHC: 30.9 g/dL (ref 30.0–36.0)
MCV: 79.3 fL — ABNORMAL LOW (ref 80.0–100.0)
Platelets: 344 K/uL (ref 150–400)
RBC: 4.25 MIL/uL (ref 3.87–5.11)
RDW: 16.5 % — ABNORMAL HIGH (ref 11.5–15.5)
WBC: 5.8 K/uL (ref 4.0–10.5)
nRBC: 0 % (ref 0.0–0.2)

## 2024-01-31 LAB — BASIC METABOLIC PANEL WITH GFR
Anion gap: 13 (ref 5–15)
BUN: 8 mg/dL (ref 6–20)
CO2: 22 mmol/L (ref 22–32)
Calcium: 9.2 mg/dL (ref 8.9–10.3)
Chloride: 104 mmol/L (ref 98–111)
Creatinine, Ser: 0.87 mg/dL (ref 0.44–1.00)
GFR, Estimated: >60 mL/min
Glucose, Bld: 103 mg/dL — ABNORMAL HIGH (ref 70–99)
Potassium: 4 mmol/L (ref 3.5–5.1)
Sodium: 138 mmol/L (ref 135–145)

## 2024-01-31 LAB — POCT PREGNANCY, URINE: Preg Test, Ur: NEGATIVE

## 2024-01-31 MED ORDER — PROPOFOL 500 MG/50ML IV EMUL
INTRAVENOUS | Status: AC
  Filled 2024-01-31: qty 100

## 2024-01-31 MED ORDER — ORAL CARE MOUTH RINSE: 15.0000 mL | Freq: Once | OROMUCOSAL | Status: AC

## 2024-01-31 MED ORDER — FENTANYL CITRATE (PF) 50 MCG/ML IJ SOSY: 25.0000 ug | PREFILLED_SYRINGE | INTRAMUSCULAR | Status: DC | PRN

## 2024-01-31 MED ORDER — FENTANYL CITRATE (PF) 100 MCG/2ML IJ SOLN
INTRAMUSCULAR | Status: AC
  Filled 2024-01-31: qty 2

## 2024-01-31 MED ORDER — PROPOFOL 500 MG/50ML IV EMUL
INTRAVENOUS | Status: DC | PRN
  Administered 2024-01-31: 150 ug/kg/min via INTRAVENOUS

## 2024-01-31 MED ORDER — ACETAMINOPHEN 10 MG/ML IV SOLN: 1000.0000 mg | Freq: Once | INTRAVENOUS | Status: DC | PRN

## 2024-01-31 MED ORDER — CEFAZOLIN SODIUM 1 G IJ SOLR
INTRAMUSCULAR | Status: AC
  Filled 2024-01-31: qty 10

## 2024-01-31 MED ORDER — LIDOCAINE HCL (PF) 2 % IJ SOLN
INTRAMUSCULAR | Status: DC | PRN
  Administered 2024-01-31: 60 mg via INTRADERMAL

## 2024-01-31 MED ORDER — FENTANYL CITRATE (PF) 100 MCG/2ML IJ SOLN
INTRAMUSCULAR | Status: DC | PRN
  Administered 2024-01-31: 100 ug via INTRAVENOUS

## 2024-01-31 MED ORDER — CEFAZOLIN SODIUM-DEXTROSE 3-4 GM/150ML-% IV SOLN
3.0000 g | INTRAVENOUS | Status: AC
  Administered 2024-01-31: 3 g via INTRAVENOUS
  Filled 2024-01-31: qty 150

## 2024-01-31 MED ORDER — CHLORHEXIDINE GLUCONATE 0.12 % MT SOLN
15.0000 mL | Freq: Once | OROMUCOSAL | Status: AC
  Administered 2024-01-31: 15 mL via OROMUCOSAL

## 2024-01-31 MED ORDER — LACTATED RINGERS IV SOLN: INTRAVENOUS | Status: DC

## 2024-01-31 MED ORDER — MIDAZOLAM HCL 2 MG/2ML IJ SOLN
INTRAMUSCULAR | Status: AC
  Filled 2024-01-31: qty 2

## 2024-01-31 MED ORDER — LIDOCAINE HCL (PF) 2 % IJ SOLN
INTRAMUSCULAR | Status: AC
  Filled 2024-01-31: qty 5

## 2024-01-31 MED ORDER — MIDAZOLAM HCL 5 MG/5ML IJ SOLN
INTRAMUSCULAR | Status: DC | PRN
  Administered 2024-01-31: 2 mg via INTRAVENOUS

## 2024-01-31 MED ORDER — STERILE WATER FOR IRRIGATION IR SOLN
Status: DC | PRN
  Administered 2024-01-31: 3000 mL

## 2024-01-31 MED ORDER — PROPOFOL 10 MG/ML IV BOLUS
INTRAVENOUS | Status: DC | PRN
  Administered 2024-01-31: 20 mg via INTRAVENOUS

## 2024-01-31 NOTE — Anesthesia Preprocedure Evaluation (Signed)
 "                                  Anesthesia Evaluation  Patient identified by MRN, date of birth, ID band Patient awake    Reviewed: Allergy & Precautions, NPO status , Patient's Chart, lab work & pertinent test results  History of Anesthesia Complications Negative for: history of anesthetic complications  Airway Mallampati: II  TM Distance: >3 FB Neck ROM: Full    Dental  (+) Teeth Intact, Dental Advisory Given   Pulmonary asthma    breath sounds clear to auscultation       Cardiovascular hypertension, Pt. on medications and Pt. on home beta blockers (-) angina (-) Past MI and (-) CHF  Rhythm:Regular  1. The left ventricle has normal systolic function with an ejection  fraction of 60-65%. The cavity size was normal. Left ventricular diastolic  parameters were normal. No evidence of left ventricular regional wall  motion abnormalities. GLS normal,  -22.5%.   2. The right ventricle has normal systolic function. The cavity was  normal. There is no increase in right ventricular wall thickness.   3. The aortic valve is tricuspid. No stenosis of the aortic valve.   4. The aortic root is normal in size and structure.   5. No evidence of mitral valve stenosis. No mitral regurgitation.   6. The IVC was normal in size. No complete TR doppler jet so unable to  estimate PA systolic pressure.     Neuro/Psych  Headaches PSYCHIATRIC DISORDERS Anxiety Depression       GI/Hepatic ,GERD  Medicated,,  Endo/Other  diabetes    Renal/GU      Musculoskeletal negative musculoskeletal ROS (+)    Abdominal   Peds  Hematology  (+) Blood dyscrasia, anemia Lab Results      Component                Value               Date                      WBC                      5.8                 01/31/2024                HGB                      10.4 (L)            01/31/2024                HCT                      33.7 (L)            01/31/2024                MCV                       79.3 (L)            01/31/2024                PLT  344                 01/31/2024              Anesthesia Other Findings   Reproductive/Obstetrics                              Anesthesia Physical Anesthesia Plan  ASA: 2  Anesthesia Plan: MAC   Post-op Pain Management: Minimal or no pain anticipated   Induction: Intravenous  PONV Risk Score and Plan: 2 and Ondansetron  and Propofol  infusion  Airway Management Planned: Nasal Cannula, Natural Airway and Simple Face Mask  Additional Equipment:   Intra-op Plan:   Post-operative Plan:   Informed Consent: I have reviewed the patients History and Physical, chart, labs and discussed the procedure including the risks, benefits and alternatives for the proposed anesthesia with the patient or authorized representative who has indicated his/her understanding and acceptance.     Dental advisory given  Plan Discussed with: CRNA  Anesthesia Plan Comments:          Anesthesia Quick Evaluation  "

## 2024-01-31 NOTE — Transfer of Care (Signed)
 Immediate Anesthesia Transfer of Care Note  Patient: Diane Lloyd  Procedure(s) Performed: CYSTOSCOPY, WITH URETHRAL DILATION (Urethra)  Patient Location: PACU  Anesthesia Type:MAC  Level of Consciousness: sedated  Airway & Oxygen Therapy: Patient Spontanous Breathing and Patient connected to face mask oxygen  Post-op Assessment: Report given to RN and Post -op Vital signs reviewed and stable  Post vital signs: Reviewed and stable  Last Vitals:  Vitals Value Taken Time  BP 146/73 01/31/24 10:15  Temp    Pulse 69 01/31/24 10:15  Resp 17 01/31/24 10:15  SpO2 100 % 01/31/24 10:15  Vitals shown include unfiled device data.  Last Pain:  Vitals:   01/31/24 0757  TempSrc: Oral  PainSc: 6          Complications: No notable events documented.

## 2024-01-31 NOTE — Op Note (Signed)
 Operative Note  Preoperative diagnosis:  1.  Microscopic hematuria 2.  Urethral stricture  Postoperative diagnosis: 1.  Same  Procedure(s): 1.  Cystoscopy with urethral dilation (female sounds)  Surgeon: Valli Shank, MD  Assistants:  None  Anesthesia:  MAC  Complications:  None  EBL:  none  Specimens: 1. none  Drains/Catheters: 1.  none  Intraoperative findings:   Normal urethral meatus Tight urethra dilated from 12Fr - 24Fr with female sounds Bilateral orthotopic Uos, clear efflux Normal bladder mucosa  Indication:  Diane Lloyd is a 34 y.o. female with microscopic hematuria. Unable to perform cystoscopy in the office due to narrow urethra.  Description of procedure:  After risks and benefits were discussed with the patient, informed consent was obtained. The patient was taken to the operating room and placed in the supine position.  Anesthesia was induced and antibiotics were administered.  She was she was prepped and draped in the usual sterile fashion and a timeout performed.  Sounds were used to dilate the urethra from 12 French to 24 French.  A 21 French rigid cystoscope was then placed in the urethral meatus and into the bladder under direct evaluation.  The bladder was systematically examined and grossly normal.  Patient's bladder was compressed cystoscope was removed.  She emerged from anesthesia and was transferred to the PACU in stable condition.   Plan:  discharge home today

## 2024-01-31 NOTE — Interval H&P Note (Signed)
 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  The various methods of treatment have been discussed with the patient and family. After consideration of risks, benefits and other options for treatment, the patient has consented to  Procedures: CYSTOSCOPY, WITH URETHRAL DILATION (N/A) as a surgical intervention.  The patient's history has been reviewed, patient examined, no change in status, stable for surgery.  I have reviewed the patient's chart and labs.  Questions were answered to the patient's satisfaction.     Rondi Ivy D Keyara Ent

## 2024-01-31 NOTE — Discharge Instructions (Addendum)
 Cystoscopy patient instructions  Following a cystoscopy, a catheter (a flexible rubber tube) is sometimes left in place to empty the bladder. This may cause some discomfort or a feeling that you need to urinate. Your doctor determines the period of time that the catheter will be left in place. You may have bloody urine for two to three days (Call your doctor if the amount of bleeding increases or does not subside).  You may pass blood clots in your urine, especially if you had a biopsy. It is not unusual to pass small blood clots and have some bloody urine a couple of weeks after your cystoscopy. Again, call your doctor if the bleeding does not subside. You may have: Dysuria (painful urination) Frequency (urinating often) Urgency (strong desire to urinate)  These symptoms are common especially if medicine is instilled into the bladder or a ureteral stent is placed. Avoiding alcohol and caffeine , such as coffee, tea, and chocolate, may help relieve these symptoms. Drink plenty of water , unless otherwise instructed. Your doctor may also prescribe an antibiotic or other medicine to reduce these symptoms.  Cystoscopy results are available soon after the procedure; biopsy results usually take two to four days. Your doctor will discuss the results of your exam with you. Before you go home, you will be given specific instructions for follow-up care. Special Instructions:   If you are going home with a catheter in place do not take a tub bath until removed by your doctor.   You may resume your normal activities.   Do not drive or operate machinery if you are taking narcotic pain medicine.   Be sure to keep all follow-up appointments with your doctor.   Call Your Doctor If: The catheter is not draining You have severe pain You are unable to urinate You have a fever over 101 You have severe bleeding           January 31, 2024  To Whom it May Concern,  Please excuse Diane Lloyd from work on  01/31/2024 as she was under my care.  She may return to work on 02/06/24 without limitations.     Sincerely,  Dr. Valli Shank [facility] [phone]

## 2024-02-01 ENCOUNTER — Encounter (HOSPITAL_COMMUNITY): Payer: Self-pay | Admitting: Urology

## 2024-02-02 NOTE — Anesthesia Postprocedure Evaluation (Signed)
"   Anesthesia Post Note  Patient: Diane Lloyd  Procedure(s) Performed: CYSTOSCOPY, WITH URETHRAL DILATION (Urethra)     Patient location during evaluation: PACU Anesthesia Type: MAC Level of consciousness: awake and alert Pain management: pain level controlled Vital Signs Assessment: post-procedure vital signs reviewed and stable Respiratory status: spontaneous breathing, nonlabored ventilation and respiratory function stable Cardiovascular status: stable and blood pressure returned to baseline Postop Assessment: no apparent nausea or vomiting Anesthetic complications: no   No notable events documented.                  [name]      "
